# Patient Record
Sex: Male | Born: 1935
Health system: Southern US, Community
[De-identification: ages and names within clinical notes are randomized; demographics above are authoritative.]

## PROBLEM LIST (undated history)

## (undated) DIAGNOSIS — J189 Pneumonia, unspecified organism: Secondary | ICD-10-CM

## (undated) DIAGNOSIS — J8289 Other pulmonary eosinophilia, not elsewhere classified: Secondary | ICD-10-CM

## (undated) DIAGNOSIS — I4891 Unspecified atrial fibrillation: Secondary | ICD-10-CM

## (undated) DIAGNOSIS — Z86010 Personal history of colonic polyps: Secondary | ICD-10-CM

## (undated) DIAGNOSIS — J82 Pulmonary eosinophilia, not elsewhere classified: Secondary | ICD-10-CM

## (undated) DIAGNOSIS — IMO0002 Reserved for concepts with insufficient information to code with codable children: Secondary | ICD-10-CM

## (undated) DIAGNOSIS — J90 Pleural effusion, not elsewhere classified: Secondary | ICD-10-CM

## (undated) DIAGNOSIS — K5730 Diverticulosis of large intestine without perforation or abscess without bleeding: Secondary | ICD-10-CM

## (undated) DIAGNOSIS — R002 Palpitations: Secondary | ICD-10-CM

## (undated) DIAGNOSIS — E785 Hyperlipidemia, unspecified: Secondary | ICD-10-CM

## (undated) DIAGNOSIS — C4491 Basal cell carcinoma of skin, unspecified: Secondary | ICD-10-CM

## (undated) DIAGNOSIS — I82409 Acute embolism and thrombosis of unspecified deep veins of unspecified lower extremity: Secondary | ICD-10-CM

## (undated) DIAGNOSIS — K56609 Unspecified intestinal obstruction, unspecified as to partial versus complete obstruction: Secondary | ICD-10-CM

## (undated) DIAGNOSIS — M1990 Unspecified osteoarthritis, unspecified site: Secondary | ICD-10-CM

## (undated) DIAGNOSIS — R001 Bradycardia, unspecified: Secondary | ICD-10-CM

## (undated) DIAGNOSIS — I214 Non-ST elevation (NSTEMI) myocardial infarction: Secondary | ICD-10-CM

## (undated) DIAGNOSIS — C3491 Malignant neoplasm of unspecified part of right bronchus or lung: Secondary | ICD-10-CM

## (undated) DIAGNOSIS — R0989 Other specified symptoms and signs involving the circulatory and respiratory systems: Secondary | ICD-10-CM

## (undated) DIAGNOSIS — J449 Chronic obstructive pulmonary disease, unspecified: Secondary | ICD-10-CM

## (undated) DIAGNOSIS — I10 Essential (primary) hypertension: Secondary | ICD-10-CM

## (undated) HISTORY — DX: Personal history of colonic polyps: Z86.010

## (undated) HISTORY — PX: APPENDECTOMY: SHX54

## (undated) HISTORY — DX: Bradycardia, unspecified: R00.1

## (undated) HISTORY — PX: JOINT REPLACEMENT: SHX530

## (undated) HISTORY — DX: Malignant neoplasm of unspecified part of right bronchus or lung: C34.91

## (undated) HISTORY — DX: Diverticulosis of large intestine without perforation or abscess without bleeding: K57.30

## (undated) HISTORY — DX: Chronic obstructive pulmonary disease, unspecified: J44.9

## (undated) HISTORY — DX: Unspecified atrial fibrillation: I48.91

## (undated) HISTORY — DX: Pleural effusion, not elsewhere classified: J90

## (undated) HISTORY — DX: Palpitations: R00.2

## (undated) HISTORY — PX: TOTAL HIP ARTHROPLASTY: SHX124

## (undated) HISTORY — PX: TONSILLECTOMY: SUR1361

## (undated) HISTORY — PX: CYSTOSCOPY: SUR368

## (undated) HISTORY — DX: Other specified symptoms and signs involving the circulatory and respiratory systems: R09.89

## (undated) HISTORY — PX: BACK SURGERY: SHX140

## (undated) HISTORY — PX: UMBILICAL HERNIA REPAIR: SHX196

## (undated) HISTORY — PX: INGUINAL HERNIA REPAIR: SUR1180

## (undated) HISTORY — DX: Essential (primary) hypertension: I10

## (undated) HISTORY — DX: Unspecified intestinal obstruction, unspecified as to partial versus complete obstruction: K56.609

## (undated) HISTORY — PX: LUMBAR LAMINECTOMY: SHX95

## (undated) HISTORY — DX: Hyperlipidemia, unspecified: E78.5

## (undated) HISTORY — PX: SHOULDER OPEN ROTATOR CUFF REPAIR: SHX2407

## (undated) HISTORY — PX: CATARACT EXTRACTION W/ INTRAOCULAR LENS IMPLANT: SHX1309

## (undated) HISTORY — PX: COLECTOMY: SHX59

---

## 1997-02-26 DIAGNOSIS — Z86010 Personal history of colonic polyps: Secondary | ICD-10-CM

## 1997-02-26 DIAGNOSIS — Z860101 Personal history of adenomatous and serrated colon polyps: Secondary | ICD-10-CM

## 1997-02-26 HISTORY — DX: Personal history of colonic polyps: Z86.010

## 1997-02-26 HISTORY — DX: Personal history of adenomatous and serrated colon polyps: Z86.0101

## 1997-07-25 ENCOUNTER — Other Ambulatory Visit: Admission: RE | Admit: 1997-07-25 | Discharge: 1997-07-25 | Payer: Self-pay | Admitting: Urology

## 1999-05-30 ENCOUNTER — Encounter: Admission: RE | Admit: 1999-05-30 | Discharge: 1999-05-30 | Payer: Self-pay | Admitting: Gastroenterology

## 1999-05-30 ENCOUNTER — Encounter: Payer: Self-pay | Admitting: Gastroenterology

## 2000-01-04 ENCOUNTER — Encounter: Admission: RE | Admit: 2000-01-04 | Discharge: 2000-01-04 | Payer: Self-pay

## 2000-08-08 ENCOUNTER — Encounter: Admission: RE | Admit: 2000-08-08 | Discharge: 2000-08-08 | Payer: Self-pay | Admitting: Gastroenterology

## 2000-08-08 ENCOUNTER — Encounter: Payer: Self-pay | Admitting: Gastroenterology

## 2000-10-18 ENCOUNTER — Encounter: Payer: Self-pay | Admitting: Surgery

## 2000-10-24 ENCOUNTER — Inpatient Hospital Stay (HOSPITAL_COMMUNITY): Admission: RE | Admit: 2000-10-24 | Discharge: 2000-10-31 | Payer: Self-pay | Admitting: Surgery

## 2000-10-24 ENCOUNTER — Encounter: Payer: Self-pay | Admitting: Urology

## 2000-10-24 ENCOUNTER — Encounter (INDEPENDENT_AMBULATORY_CARE_PROVIDER_SITE_OTHER): Payer: Self-pay

## 2002-12-21 ENCOUNTER — Encounter: Payer: Self-pay | Admitting: Orthopedic Surgery

## 2002-12-26 ENCOUNTER — Inpatient Hospital Stay (HOSPITAL_COMMUNITY): Admission: RE | Admit: 2002-12-26 | Discharge: 2002-12-30 | Payer: Self-pay | Admitting: Orthopedic Surgery

## 2002-12-26 ENCOUNTER — Encounter: Payer: Self-pay | Admitting: Orthopedic Surgery

## 2004-12-24 ENCOUNTER — Ambulatory Visit (HOSPITAL_COMMUNITY): Admission: RE | Admit: 2004-12-24 | Discharge: 2004-12-24 | Payer: Self-pay | Admitting: Internal Medicine

## 2006-07-12 ENCOUNTER — Ambulatory Visit: Payer: Self-pay | Admitting: Internal Medicine

## 2006-07-21 ENCOUNTER — Ambulatory Visit: Payer: Self-pay

## 2006-08-04 ENCOUNTER — Ambulatory Visit: Payer: Self-pay | Admitting: Internal Medicine

## 2006-09-08 ENCOUNTER — Ambulatory Visit: Payer: Self-pay | Admitting: Internal Medicine

## 2006-09-08 LAB — CONVERTED CEMR LAB
INR: 1.4 (ref 0.9–2.0)
Prothrombin Time: 14.7 s — ABNORMAL HIGH (ref 10.0–14.0)
aPTT: 32.1 s (ref 26.5–36.5)

## 2006-09-12 ENCOUNTER — Ambulatory Visit: Payer: Self-pay | Admitting: Internal Medicine

## 2006-09-23 ENCOUNTER — Ambulatory Visit: Payer: Self-pay | Admitting: Internal Medicine

## 2006-09-28 ENCOUNTER — Ambulatory Visit: Payer: Self-pay | Admitting: Cardiology

## 2006-10-05 ENCOUNTER — Ambulatory Visit: Payer: Self-pay | Admitting: Cardiology

## 2006-10-12 ENCOUNTER — Ambulatory Visit: Payer: Self-pay | Admitting: Internal Medicine

## 2006-10-20 ENCOUNTER — Ambulatory Visit: Payer: Self-pay | Admitting: Cardiology

## 2006-10-24 ENCOUNTER — Ambulatory Visit: Payer: Self-pay

## 2006-10-24 ENCOUNTER — Ambulatory Visit: Payer: Self-pay | Admitting: Internal Medicine

## 2006-10-27 ENCOUNTER — Ambulatory Visit: Payer: Self-pay | Admitting: Cardiology

## 2006-11-03 ENCOUNTER — Ambulatory Visit: Payer: Self-pay | Admitting: Cardiovascular Disease

## 2006-11-14 ENCOUNTER — Ambulatory Visit: Payer: Self-pay | Admitting: Cardiology

## 2006-11-30 ENCOUNTER — Ambulatory Visit: Payer: Self-pay | Admitting: Internal Medicine

## 2006-12-05 ENCOUNTER — Ambulatory Visit: Payer: Self-pay | Admitting: Gastroenterology

## 2006-12-14 ENCOUNTER — Ambulatory Visit: Payer: Self-pay | Admitting: Internal Medicine

## 2007-01-04 ENCOUNTER — Ambulatory Visit: Payer: Self-pay | Admitting: Internal Medicine

## 2007-01-11 ENCOUNTER — Ambulatory Visit: Payer: Self-pay | Admitting: Gastroenterology

## 2007-01-11 ENCOUNTER — Encounter: Payer: Self-pay | Admitting: Gastroenterology

## 2007-02-01 ENCOUNTER — Ambulatory Visit: Payer: Self-pay | Admitting: Cardiology

## 2007-02-15 ENCOUNTER — Ambulatory Visit: Payer: Self-pay | Admitting: Cardiology

## 2007-03-16 ENCOUNTER — Ambulatory Visit: Payer: Self-pay | Admitting: Cardiology

## 2007-03-27 ENCOUNTER — Ambulatory Visit: Payer: Self-pay | Admitting: Cardiology

## 2007-07-03 ENCOUNTER — Ambulatory Visit: Payer: Self-pay | Admitting: Internal Medicine

## 2007-07-03 ENCOUNTER — Ambulatory Visit: Payer: Self-pay | Admitting: Cardiovascular Disease

## 2007-07-19 ENCOUNTER — Ambulatory Visit: Payer: Self-pay | Admitting: Cardiology

## 2007-07-20 ENCOUNTER — Ambulatory Visit: Payer: Self-pay | Admitting: Cardiology

## 2007-07-20 ENCOUNTER — Inpatient Hospital Stay (HOSPITAL_COMMUNITY): Admission: EM | Admit: 2007-07-20 | Discharge: 2007-07-31 | Payer: Self-pay | Admitting: Emergency Medicine

## 2007-08-04 ENCOUNTER — Ambulatory Visit: Payer: Self-pay | Admitting: Cardiology

## 2007-08-09 ENCOUNTER — Ambulatory Visit: Payer: Self-pay | Admitting: Cardiology

## 2007-08-15 ENCOUNTER — Ambulatory Visit: Payer: Self-pay

## 2007-08-16 ENCOUNTER — Ambulatory Visit: Payer: Self-pay | Admitting: Cardiology

## 2007-08-23 ENCOUNTER — Ambulatory Visit: Payer: Self-pay | Admitting: Internal Medicine

## 2007-08-23 ENCOUNTER — Ambulatory Visit: Payer: Self-pay | Admitting: Cardiology

## 2007-08-31 ENCOUNTER — Ambulatory Visit: Payer: Self-pay

## 2007-08-31 ENCOUNTER — Encounter: Payer: Self-pay | Admitting: Internal Medicine

## 2007-08-31 ENCOUNTER — Ambulatory Visit: Payer: Self-pay | Admitting: Internal Medicine

## 2007-09-18 ENCOUNTER — Ambulatory Visit: Payer: Self-pay | Admitting: Internal Medicine

## 2007-10-16 ENCOUNTER — Ambulatory Visit: Payer: Self-pay | Admitting: Cardiovascular Disease

## 2007-10-26 ENCOUNTER — Ambulatory Visit: Payer: Self-pay | Admitting: Cardiology

## 2007-11-22 ENCOUNTER — Ambulatory Visit: Payer: Self-pay | Admitting: Cardiovascular Disease

## 2007-12-06 ENCOUNTER — Ambulatory Visit: Payer: Self-pay | Admitting: Internal Medicine

## 2008-01-03 ENCOUNTER — Ambulatory Visit: Payer: Self-pay | Admitting: Internal Medicine

## 2008-01-17 ENCOUNTER — Ambulatory Visit: Payer: Self-pay | Admitting: Cardiology

## 2008-02-02 ENCOUNTER — Ambulatory Visit: Payer: Self-pay | Admitting: Cardiology

## 2008-02-16 ENCOUNTER — Ambulatory Visit: Payer: Self-pay | Admitting: Cardiovascular Disease

## 2008-03-13 ENCOUNTER — Ambulatory Visit: Payer: Self-pay | Admitting: Cardiology

## 2008-03-26 ENCOUNTER — Ambulatory Visit: Payer: Self-pay | Admitting: Cardiovascular Disease

## 2008-07-03 ENCOUNTER — Ambulatory Visit: Payer: Self-pay | Admitting: Cardiology

## 2008-07-17 DIAGNOSIS — K649 Unspecified hemorrhoids: Secondary | ICD-10-CM | POA: Insufficient documentation

## 2008-07-17 DIAGNOSIS — I10 Essential (primary) hypertension: Secondary | ICD-10-CM

## 2008-07-17 DIAGNOSIS — D126 Benign neoplasm of colon, unspecified: Secondary | ICD-10-CM | POA: Insufficient documentation

## 2008-07-17 DIAGNOSIS — R002 Palpitations: Secondary | ICD-10-CM

## 2008-07-17 DIAGNOSIS — J45909 Unspecified asthma, uncomplicated: Secondary | ICD-10-CM

## 2008-07-17 DIAGNOSIS — I4891 Unspecified atrial fibrillation: Secondary | ICD-10-CM | POA: Insufficient documentation

## 2008-07-17 DIAGNOSIS — I498 Other specified cardiac arrhythmias: Secondary | ICD-10-CM

## 2008-07-17 DIAGNOSIS — K5730 Diverticulosis of large intestine without perforation or abscess without bleeding: Secondary | ICD-10-CM | POA: Insufficient documentation

## 2008-07-31 ENCOUNTER — Ambulatory Visit: Payer: Self-pay | Admitting: Cardiovascular Disease

## 2008-08-06 ENCOUNTER — Ambulatory Visit: Payer: Self-pay | Admitting: Internal Medicine

## 2008-08-06 DIAGNOSIS — E785 Hyperlipidemia, unspecified: Secondary | ICD-10-CM | POA: Insufficient documentation

## 2008-08-27 ENCOUNTER — Encounter: Payer: Self-pay | Admitting: *Deleted

## 2008-08-28 ENCOUNTER — Ambulatory Visit: Payer: Self-pay | Admitting: Cardiology

## 2008-08-28 LAB — CONVERTED CEMR LAB
POC INR: 3.8
Protime: 23.7

## 2008-09-11 ENCOUNTER — Ambulatory Visit: Payer: Self-pay | Admitting: Cardiology

## 2008-09-11 LAB — CONVERTED CEMR LAB: POC INR: 2

## 2008-09-18 ENCOUNTER — Encounter: Payer: Self-pay | Admitting: Internal Medicine

## 2008-10-02 ENCOUNTER — Encounter: Payer: Self-pay | Admitting: *Deleted

## 2008-10-09 ENCOUNTER — Ambulatory Visit: Payer: Self-pay | Admitting: Internal Medicine

## 2008-10-09 LAB — CONVERTED CEMR LAB: Prothrombin Time: 17.5 s

## 2008-11-06 ENCOUNTER — Encounter: Payer: Self-pay | Admitting: Cardiology

## 2008-11-11 ENCOUNTER — Ambulatory Visit: Payer: Self-pay | Admitting: Cardiovascular Disease

## 2008-11-11 LAB — CONVERTED CEMR LAB: POC INR: 2.7

## 2008-12-11 ENCOUNTER — Ambulatory Visit: Payer: Self-pay | Admitting: Internal Medicine

## 2009-01-08 ENCOUNTER — Ambulatory Visit: Payer: Self-pay | Admitting: Internal Medicine

## 2009-01-08 LAB — CONVERTED CEMR LAB: POC INR: 2

## 2009-02-05 ENCOUNTER — Ambulatory Visit: Payer: Self-pay | Admitting: Internal Medicine

## 2009-02-05 LAB — CONVERTED CEMR LAB: POC INR: 2.2

## 2009-02-27 ENCOUNTER — Ambulatory Visit: Payer: Self-pay | Admitting: Internal Medicine

## 2009-02-27 ENCOUNTER — Ambulatory Visit: Payer: Self-pay | Admitting: Cardiovascular Disease

## 2009-03-26 ENCOUNTER — Ambulatory Visit: Payer: Self-pay | Admitting: Cardiology

## 2009-04-23 ENCOUNTER — Encounter: Payer: Self-pay | Admitting: Internal Medicine

## 2009-04-24 ENCOUNTER — Encounter: Payer: Self-pay | Admitting: Internal Medicine

## 2009-05-22 ENCOUNTER — Encounter (INDEPENDENT_AMBULATORY_CARE_PROVIDER_SITE_OTHER): Payer: Self-pay | Admitting: Cardiology

## 2009-05-22 ENCOUNTER — Encounter: Payer: Self-pay | Admitting: Cardiology

## 2009-05-22 LAB — CONVERTED CEMR LAB: POC INR: 2.3

## 2009-05-23 ENCOUNTER — Encounter: Payer: Self-pay | Admitting: Cardiology

## 2009-06-16 ENCOUNTER — Encounter (INDEPENDENT_AMBULATORY_CARE_PROVIDER_SITE_OTHER): Payer: Self-pay | Admitting: Cardiology

## 2009-06-17 ENCOUNTER — Encounter: Payer: Self-pay | Admitting: Cardiovascular Disease

## 2009-06-17 LAB — CONVERTED CEMR LAB: POC INR: 2.7

## 2009-07-16 ENCOUNTER — Ambulatory Visit: Payer: Self-pay | Admitting: Cardiology

## 2009-08-13 ENCOUNTER — Ambulatory Visit: Payer: Self-pay | Admitting: Cardiology

## 2009-08-25 ENCOUNTER — Emergency Department (HOSPITAL_COMMUNITY): Admission: EM | Admit: 2009-08-25 | Discharge: 2009-08-25 | Payer: Self-pay | Admitting: Family Medicine

## 2009-08-29 ENCOUNTER — Ambulatory Visit: Payer: Self-pay | Admitting: Internal Medicine

## 2009-08-29 ENCOUNTER — Ambulatory Visit: Payer: Self-pay | Admitting: Cardiovascular Disease

## 2009-08-29 LAB — CONVERTED CEMR LAB: POC INR: 2

## 2009-09-05 ENCOUNTER — Ambulatory Visit: Payer: Self-pay | Admitting: Internal Medicine

## 2009-09-23 ENCOUNTER — Telehealth: Payer: Self-pay | Admitting: Internal Medicine

## 2009-09-26 ENCOUNTER — Ambulatory Visit: Payer: Self-pay | Admitting: Cardiology

## 2009-09-26 LAB — CONVERTED CEMR LAB: POC INR: 2

## 2009-10-10 ENCOUNTER — Ambulatory Visit: Payer: Self-pay | Admitting: Cardiology

## 2009-10-21 ENCOUNTER — Ambulatory Visit: Payer: Self-pay | Admitting: Cardiology

## 2009-10-21 LAB — CONVERTED CEMR LAB: POC INR: 2

## 2009-11-05 ENCOUNTER — Ambulatory Visit: Payer: Self-pay | Admitting: Cardiology

## 2009-11-05 LAB — CONVERTED CEMR LAB: POC INR: 1.6

## 2009-11-19 ENCOUNTER — Ambulatory Visit: Payer: Self-pay | Admitting: Internal Medicine

## 2009-11-19 LAB — CONVERTED CEMR LAB: POC INR: 2.1

## 2009-12-10 ENCOUNTER — Ambulatory Visit: Payer: Self-pay | Admitting: Internal Medicine

## 2009-12-10 LAB — CONVERTED CEMR LAB: POC INR: 2.5

## 2010-01-09 ENCOUNTER — Ambulatory Visit: Payer: Self-pay | Admitting: Cardiology

## 2010-01-09 LAB — CONVERTED CEMR LAB: POC INR: 3.2

## 2010-02-04 ENCOUNTER — Ambulatory Visit: Payer: Self-pay | Admitting: Cardiovascular Disease

## 2010-02-04 LAB — CONVERTED CEMR LAB: POC INR: 2

## 2010-03-03 ENCOUNTER — Ambulatory Visit: Payer: Self-pay | Admitting: Cardiovascular Disease

## 2010-03-03 ENCOUNTER — Ambulatory Visit: Payer: Self-pay | Admitting: Internal Medicine

## 2010-03-03 LAB — CONVERTED CEMR LAB: POC INR: 2.1

## 2010-03-24 ENCOUNTER — Ambulatory Visit: Payer: Self-pay | Admitting: Internal Medicine

## 2010-03-24 LAB — CONVERTED CEMR LAB: POC INR: 2.6

## 2010-04-20 ENCOUNTER — Encounter: Payer: Self-pay | Admitting: Cardiology

## 2010-04-20 ENCOUNTER — Encounter: Payer: Self-pay | Admitting: Internal Medicine

## 2010-04-20 LAB — CONVERTED CEMR LAB
POC INR: 2
Prothrombin Time: 21 s

## 2010-04-21 ENCOUNTER — Encounter: Payer: Self-pay | Admitting: Cardiology

## 2010-04-30 NOTE — Medication Information (Signed)
Summary: rov/sl  Anticoagulant Therapy  Managed by: Bethena Midget, RN, BSN Referring MD: Lewayne Bunting PCP: Dr.William Cleophus Molt MD: Johney Frame MD, Fayrene Fearing Indication 1: Atrial Fibrillation (ICD-427.31) Lab Used: [facility] [place] Site: [address] INR POC 2.5 INR RANGE 2 - 3  Dietary changes: no    Health status changes: no    Bleeding/hemorrhagic complications: no    Recent/future hospitalizations: no    Any changes in medication regimen? no    Recent/future dental: no  Any missed doses?: no       Is patient compliant with meds? yes       Allergies: 1)  ! Sulfa  Anticoagulation Management History:      The patient is taking warfarin and comes in today for a routine follow up visit.  Positive risk factors for bleeding include an age of 75 years or older.  The bleeding index is 'intermediate risk'.  Positive CHADS2 values include History of HTN and Age > 75 years old.  The start date was 09/16/2006.  His last INR was 2.7.  Anticoagulation responsible provider: Allred MD, Fayrene Fearing.  INR POC: 2.5.  Cuvette Lot#: [id number].  Exp: 01/2011.    Anticoagulation Management Assessment/Plan:      The patient's current anticoagulation dose is Coumadin 5 mg tabs: Take as directed by Anticoagulation Clinic.  The target INR is 2.0-3.0.  The next INR is due 01/07/2010.  Anticoagulation instructions were given to patient.  Results were reviewed/authorized by Bethena Midget, RN, BSN.  He was notified by Bethena Midget, RN, BSN.         Prior Anticoagulation Instructions: INR 2.1  Continue taking Coumadin 1 tab (5 mg) on all days except Coumadin 1.5 tabs (7.5 mg) on Wednesdays. Return to clinic in 3 weeks.   Current Anticoagulation Instructions: INR 2.5 Continue 5mg s everyday except 7.5mg s on Wednesdays. Recheck in 4 weeks.

## 2010-04-30 NOTE — Medication Information (Signed)
Summary: sclera hemmorage/ewj  Anticoagulant Therapy  Managed by: Weston Brass, PharmD Referring MD: Lewayne Bunting PCP: Dr.William Cleophus Molt MD: Juanda Chance MD, Raeshaun Simson Indication 1: Atrial Fibrillation (ICD-427.31) Lab Used: [facility] [place] Site: [address] INR POC 2.0 INR RANGE 2 - 3  Dietary changes: no    Health status changes: no    Bleeding/hemorrhagic complications: yes       Details: has sclera hemorrhage in R eye  Recent/future hospitalizations: no    Any changes in medication regimen? no    Recent/future dental: no  Any missed doses?: no       Is patient compliant with meds? yes       Allergies: 1)  ! Sulfa  Anticoagulation Management History:      The patient is taking warfarin and comes in today for a routine follow up visit.  Positive risk factors for bleeding include an age of 3 years or older.  The bleeding index is 'intermediate risk'.  Positive CHADS2 values include History of HTN and Age > 36 years old.  The start date was 09/16/2006.  His last INR was 2.7.  Anticoagulation responsible provider: Juanda Chance MD, Smitty Cords.  INR POC: 2.0.  Cuvette Lot#: [id number].  Exp: 12/2010.    Anticoagulation Management Assessment/Plan:      The patient's current anticoagulation dose is Coumadin 5 mg tabs: Take as directed by Anticoagulation Clinic.  The target INR is 2.0-3.0.  The next INR is due 11/05/2009.  Anticoagulation instructions were given to patient.  Results were reviewed/authorized by Weston Brass, PharmD.  He was notified by Dillard Cannon.         Prior Anticoagulation Instructions: INR 2.3  Continue same regimen of 1.5 tabs on Wednesday and 1 tab all other days.  Re-check INR in 4 weeks.  Current Anticoagulation Instructions: INR 2.0  Continue same dose of 1 tab daily except for 1.5 tabs on Wednesday.  Re-check at previously scheduled appointment on 11/05/09.

## 2010-04-30 NOTE — Medication Information (Signed)
Summary: rov/tm      Allergies Added:  Anticoagulant Therapy  Managed by: Samantha Crimes, PharmD Referring MD: Lewayne Bunting PCP: Dr.William Cleophus Molt MD: Graciela Husbands MD, Viviann Spare Indication 1: Atrial Fibrillation (ICD-427.31) Lab Used: [facility] [place] Site: [address] INR POC 2.6 INR RANGE 2 - 3  Dietary changes: no    Health status changes: no    Bleeding/hemorrhagic complications: no    Recent/future hospitalizations: no    Any changes in medication regimen? no    Recent/future dental: no  Any missed doses?: no       Is patient compliant with meds? yes       Current Medications (verified): 1)  Advair Diskus 250-50 Mcg/dose Misc (Fluticasone-Salmeterol) .Marland Kitchen.. 1 Puff Two Times A Day 2)  Flonase 50 Mcg/act Susp (Fluticasone Propionate) .... Once Daily 3)  Zyrtec Allergy 10 Mg Tabs (Cetirizine Hcl) .Marland Kitchen.. 1 By Mouth Once Daily 4)  Singulair 10 Mg Tabs (Montelukast Sodium) .Marland Kitchen.. 1 By Mouth Once Daily 5)  Prilosec 20 Mg Cpdr (Omeprazole) .... Take One Tablet By Mouth Once Daily. 6)  Multivitamins   Tabs (Multiple Vitamin) .Marland Kitchen.. 1 By Mouth Once Daily 7)  Ambien 10 Mg Tabs (Zolpidem Tartrate) .Marland Kitchen.. 1 By Mouth At Bedtime 8)  Temazepam 15 Mg Caps (Temazepam) .Marland Kitchen.. 1 By Mouth Once Daily 9)  Flecainide Acetate 150 Mg Tabs (Flecainide Acetate) .... Take One Tablet By Mouth Twice Daily. 10)  Celebrex 200 Mg Caps (Celecoxib) .Marland Kitchen.. 1 By Mouth Once Daily 11)  Finasteride 5 Mg Tabs (Finasteride) .Marland Kitchen.. 1 Tab Daily. 12)  Crestor 5 Mg Tabs (Rosuvastatin Calcium) .... Take One Tablet By Mouth Daily. 13)  Flexeril 10 Mg Tabs (Cyclobenzaprine Hcl) .... Prn 14)  Allergy Shots .... As Directed 15)  Coumadin 5 Mg Tabs (Warfarin Sodium) .... Take As Directed By Anticoagulation Clinic 16)  Calcium 600-200 Mg-Unit Tabs (Calcium-Vitamin D) .... Take 1 Daily  Allergies (verified): 1)  ! Sulfa  Anticoagulation Management History:      Positive risk factors for bleeding include an  age of 64 years or older.  The bleeding index is 'intermediate risk'.  Positive CHADS2 values include History of HTN and Age > 82 years old.  The start date was 09/16/2006.  His last INR was 2.7.  Anticoagulation responsible provider: Graciela Husbands MD, Viviann Spare.  INR POC: 2.6.  Exp: 12/2010.    Anticoagulation Management Assessment/Plan:      The patient's current anticoagulation dose is Coumadin 5 mg tabs: Take as directed by Anticoagulation Clinic.  The target INR is 2.0-3.0.  The next INR is due 04/21/2010.  Anticoagulation instructions were given to patient.  Results were reviewed/authorized by Samantha Crimes, PharmD.         Prior Anticoagulation Instructions: INR 2.1 Continue 5mg s daily except 7.5mg s on Wednesdays. Recheck in 3 weeks.   Current Anticoagulation Instructions: Cont current regimen Return to clinic on Jan 20th. Contact us when you return

## 2010-04-30 NOTE — Medication Information (Signed)
Summary: rov/sp  Anticoagulant Therapy  Managed by: Cloyde Reams, RN, BSN Referring MD: Lewayne Bunting PCP: Dr.William Cleophus Molt MD: Excell Seltzer MD, Casimiro Needle Indication 1: Atrial Fibrillation (ICD-427.31) Lab Used: [facility] [place] Site: [address] INR POC 2.0 INR RANGE 2 - 3  Dietary changes: no    Health status changes: no    Bleeding/hemorrhagic complications: no    Recent/future hospitalizations: no    Any changes in medication regimen? yes       Details: Broke rib using prn codiene  Recent/future dental: no  Any missed doses?: no       Is patient compliant with meds? yes       Allergies: 1)  ! Sulfa  Anticoagulation Management History:      The patient is taking warfarin and comes in today for a routine follow up visit.  Positive risk factors for bleeding include an age of 75 years or older.  The bleeding index is 'intermediate risk'.  Positive CHADS2 values include History of HTN and Age > 55 years old.  The start date was 09/16/2006.  His last INR was 2.7.  Anticoagulation responsible provider: Excell Seltzer MD, Casimiro Needle.  INR POC: 2.0.  Cuvette Lot#: [id number].  Exp: 10/2010.    Anticoagulation Management Assessment/Plan:      The patient's current anticoagulation dose is Coumadin 5 mg tabs: Take as directed by Anticoagulation Clinic.  The target INR is 2.0-3.0.  The next INR is due 09/26/2009.  Anticoagulation instructions were given to patient.  Results were reviewed/authorized by Cloyde Reams, RN, BSN.  He was notified by Cloyde Reams RN.         Prior Anticoagulation Instructions: INR 2.3  Continue same dose of 5mg  daily except 7.5mg  on Wednesday.    Current Anticoagulation Instructions: INR 2.0  Take 1.5 tablets tomorrow, then resume same dosage 1 tablet daily except 1.5 tablets on Wednesdays.  Recheck in 4 weeks.

## 2010-04-30 NOTE — Medication Information (Signed)
Summary: rov/tm  Anticoagulant Therapy  Managed by: Bethena Midget, RN, BSN Referring MD: Lewayne Bunting PCP: Dr.William Cleophus Molt MD: Eden Emms MD, Theron Arista Indication 1: Atrial Fibrillation (ICD-427.31) Lab Used: [facility] [place] Site: [address] INR POC 2.1 INR RANGE 2 - 3  Dietary changes: no    Health status changes: no    Bleeding/hemorrhagic complications: no    Recent/future hospitalizations: no    Any changes in medication regimen? no    Recent/future dental: no  Any missed doses?: no       Is patient compliant with meds? yes       Current Medications (verified): 1)  Advair Diskus 250-50 Mcg/dose Misc (Fluticasone-Salmeterol) .Marland Kitchen.. 1 Puff Two Times A Day 2)  Flonase 50 Mcg/act Susp (Fluticasone Propionate) .... Once Daily 3)  Zyrtec Allergy 10 Mg Tabs (Cetirizine Hcl) .Marland Kitchen.. 1 By Mouth Once Daily 4)  Singulair 10 Mg Tabs (Montelukast Sodium) .Marland Kitchen.. 1 By Mouth Once Daily 5)  Prilosec 20 Mg Cpdr (Omeprazole) .... Take One Tablet By Mouth Once Daily. 6)  Multivitamins   Tabs (Multiple Vitamin) .Marland Kitchen.. 1 By Mouth Once Daily 7)  Ambien 10 Mg Tabs (Zolpidem Tartrate) .Marland Kitchen.. 1 By Mouth At Bedtime 8)  Temazepam 15 Mg Caps (Temazepam) .Marland Kitchen.. 1 By Mouth Once Daily 9)  Flecainide Acetate 150 Mg Tabs (Flecainide Acetate) .... Take One Tablet By Mouth Twice Daily. 10)  Celebrex 200 Mg Caps (Celecoxib) .Marland Kitchen.. 1 By Mouth Once Daily 11)  Finasteride 5 Mg Tabs (Finasteride) .Marland Kitchen.. 1 Tab Daily. 12)  Crestor 5 Mg Tabs (Rosuvastatin Calcium) .... Take One Tablet By Mouth Daily. 13)  Flexeril 10 Mg Tabs (Cyclobenzaprine Hcl) .... Prn 14)  Allergy Shots .... As Directed 15)  Coumadin 5 Mg Tabs (Warfarin Sodium) .... Take As Directed By Anticoagulation Clinic 16)  Calcium 600-200 Mg-Unit Tabs (Calcium-Vitamin D) .... Take 1 Daily  Allergies (verified): 1)  ! Sulfa  Anticoagulation Management History:      The patient is taking warfarin and comes in today for a routine follow  up visit.  Positive risk factors for bleeding include an age of 75 years or older.  The bleeding index is 'intermediate risk'.  Positive CHADS2 values include History of HTN and Age > 93 years old.  The start date was 09/16/2006.  His last INR was 2.7.  Anticoagulation responsible provider: Eden Emms MD, Theron Arista.  INR POC: 2.1.  Cuvette Lot#: [id number].  Exp: 12/2010.    Anticoagulation Management Assessment/Plan:      The patient's current anticoagulation dose is Coumadin 5 mg tabs: Take as directed by Anticoagulation Clinic.  The target INR is 2.0-3.0.  The next INR is due 03/24/2010.  Anticoagulation instructions were given to patient.  Results were reviewed/authorized by Bethena Midget, RN, BSN.  He was notified by Bethena Midget, RN, BSN.         Prior Anticoagulation Instructions: INR 2.0 Continue 5mg s daily except 7.5mg s on Wednesdays. Recheck in 4 weeks.   Current Anticoagulation Instructions: INR 2.1 Continue 5mg s daily except 7.5mg s on Wednesdays. Recheck in 3 weeks.

## 2010-04-30 NOTE — Assessment & Plan Note (Signed)
Summary: per check out/sf  Medications Added FLECAINIDE ACETATE 150 MG TABS (FLECAINIDE ACETATE) Take one tablet by mouth twice daily. BROMDAY 0.09 % SOLN (BROMFENAC SODIUM) UAD      Allergies Added:   Visit Type:  Follow-up Primary Provider:  Dr.William Clelia [name]   History of Present Illness: [name] returns today for followup.  He is a pleasant 77 yo man with a h/o PAF, HTN, and dyslipidemia.  He has done well on Flecainide for control of his atrial fibrillation.  He notes rare palpitations which are minimally symptomatic. No other complaints.  Current Medications (verified): 1)  Advair Diskus 250-50 Mcg/dose Misc (Fluticasone-Salmeterol) .Marland Kitchen.. 1 Puff Two Times A Day 2)  Flonase 50 Mcg/act Susp (Fluticasone Propionate) .... Once Daily 3)  Zyrtec Allergy 10 Mg Tabs (Cetirizine Hcl) .Marland Kitchen.. 1 By Mouth Once Daily 4)  Singulair 10 Mg Tabs (Montelukast Sodium) .Marland Kitchen.. 1 By Mouth Once Daily 5)  Prilosec 20 Mg Cpdr (Omeprazole) .... Take One Tablet By Mouth Once Daily. 6)  Multivitamins   Tabs (Multiple Vitamin) .Marland Kitchen.. 1 By Mouth Once Daily 7)  Calcium Carbonate   Powd (Calcium Carbonate) .Marland Kitchen.. 1 By Mouth Once Daily 8)  Ambien 10 Mg Tabs (Zolpidem Tartrate) .Marland Kitchen.. 1 By Mouth At Bedtime 9)  Temazepam 15 Mg Caps (Temazepam) .Marland Kitchen.. 1 By Mouth Once Daily 10)  Flecainide Acetate 150 Mg Tabs (Flecainide Acetate) .... Take One Tablet By Mouth Twice Daily. 11)  Celebrex 200 Mg Caps (Celecoxib) .Marland Kitchen.. 1 By Mouth Once Daily 12)  Finasteride 5 Mg Tabs (Finasteride) .Marland Kitchen.. 1 Tab Daily. 13)  Crestor 5 Mg Tabs (Rosuvastatin Calcium) .... Take One Tablet By Mouth Daily. 14)  Flexeril 10 Mg Tabs (Cyclobenzaprine Hcl) .Marland Kitchen.. 1 By Mouth Once Daily 15)  Gabapentin 300 Mg Caps (Gabapentin) .Marland Kitchen.. 1 By Mouth Once Daily 16)  Allergy Shots .... As Directed 17)  Coumadin 5 Mg Tabs (Warfarin Sodium) .... Take As Directed By Anticoagulation Clinic 18)  Bromday 0.09 % Soln (Bromfenac Sodium) .... Uad  Allergies (verified): 1)  !  Sulfa  Past History:  Past Medical History: Last updated: 07/17/2008 BRADYCARDIA (ICD-427.89) PALPITATIONS (ICD-785.1) ASTHMA (ICD-493.90) HEMORRHOIDS (ICD-455.6) COLONIC POLYPS, ADENOMATOUS (ICD-211.3) DIVERTICULOSIS, COLON (ICD-562.10) COUMADIN THERAPY (ICD-V58.61) ATRIAL FIBRILLATION (ICD-427.31) HYPERTENSION, UNSPECIFIED (ICD-401.9) Pulmonary Aspergillosis  Past Surgical History: Last updated: 07/17/2008 Colon Resection: Hip replacement Appendectomy Laninectomy Cystoscopy  Review of Systems  The patient denies chest pain, syncope, dyspnea on exertion, and peripheral edema.    Vital Signs:  Patient profile:   77 year old male Height:      70 inches Weight:      170 pounds BMI:     24.48 Pulse rate:   60 / minute BP sitting:   144 / 82  (left arm)  Vitals Entered By: Laurance Flatten CMA (August 29, 2009 2:17 PM)  Physical Exam  General:  Elderly, well developed, well nourished, in no acute distress.  HEENT: normal Neck: supple. No JVD. Carotids 2+ bilaterally no bruits Cor: RRR no rubs, gallops or murmur Lungs: CTA Ab: soft, nontender. nondistended. No HSM. Good bowel sounds Ext: warm. no cyanosis, clubbing or edema Neuro: alert and oriented. Grossly nonfocal. affect pleasant    EKG  Procedure date:  08/29/2009  Findings:      Normal sinus rhythm with rate of:  60.  Impression & Recommendations:  Problem # 1:  ATRIAL FIBRILLATION (ICD-427.31) His symptoms have been well controlled.  Continue current meds.  I have asked him to obtain a trough flecainide  level in the next week. His updated medication list for this problem includes:    Flecainide Acetate 150 Mg Tabs (Flecainide acetate) .Marland Kitchen... Take one tablet by mouth twice daily.    Coumadin 5 Mg Tabs (Warfarin sodium) .Marland Kitchen... Take as directed by anticoagulation clinic  Problem # 2:  DYSLIPIDEMIA (ICD-272.4) He will continue his Crestor and maintain a low fat diet. His updated medication list for this  problem includes:    Crestor 5 Mg Tabs (Rosuvastatin calcium) .Marland Kitchen... Take one tablet by mouth daily.  Problem # 3:  COUMADIN THERAPY (ICD-V58.61) We discussed the possibility of switching to Pradaxa and the risks/benefits/of the drug have been discussed and he would like to stay with coumadin for now.  Patient Instructions: 1)  Your physician recommends that you schedule a follow-up appointment in: 6 months with Dr [name] 2)  Your physician recommends that you return for lab work next week trough Flecainide level

## 2010-04-30 NOTE — Medication Information (Signed)
Summary: rov/sp   Anticoagulant Therapy  Managed by: Reina Fuse, PharmD Referring MD: Lewayne Bunting PCP: Dr.William Cleophus Molt MD: Johney Frame MD, Fayrene Fearing Indication 1: Atrial Fibrillation (ICD-427.31) Lab Used: [facility]  Site: [address] INR POC 2.1 INR RANGE 2 - 3  Dietary changes: no    Health status changes: no    Bleeding/hemorrhagic complications: no    Recent/future hospitalizations: no    Any changes in medication regimen? no    Recent/future dental: no  Any missed doses?: no       Is patient compliant with meds? yes       Allergies: 1)  ! Sulfa  Anticoagulation Management History:      The patient is taking warfarin and comes in today for a routine follow up visit.  Positive risk factors for bleeding include an age of 75 years or older.  The bleeding index is 'intermediate risk'.  Positive CHADS2 values include History of HTN and Age > 7 years old.  The start date was 09/16/2006.  His last INR was 2.7.  Anticoagulation responsible provider: Allred MD, Fayrene Fearing.  INR POC: 2.1.  Cuvette Lot#: [id number].  Exp: 12/2010.    Anticoagulation Management Assessment/Plan:      The patient's current anticoagulation dose is Coumadin 5 mg tabs: Take as directed by Anticoagulation Clinic.  The target INR is 2.0-3.0.  The next INR is due 12/10/2009.  Anticoagulation instructions were given to patient.  Results were reviewed/authorized by Reina Fuse, PharmD.  He was notified by Reina Fuse PharmD.         Prior Anticoagulation Instructions: INR 1.6  Take 1.5 tablets tomorrow, Aug. 11, then resume with 1 tablet daily except 1.5 tablets on Wed.  Return to clinic in 2 weeks.   Current Anticoagulation Instructions: INR 2.1  Continue taking Coumadin 1 tab (5 mg) on all days except Coumadin 1.5 tabs (7.5 mg) on Wednesdays. Return to clinic in 3 weeks.

## 2010-04-30 NOTE — Medication Information (Signed)
Summary: Coumadin Clinic  Anticoagulant Therapy  Managed by: Cloyde Reams, RN, BSN Referring MD: Lewayne Bunting PCP: Dr.William Cleophus Molt MD: Graciela Husbands MD, Viviann Spare Indication 1: Atrial Fibrillation (ICD-427.31) Lab Used: [facility] [place] Site: [facility] 24.8 INR POC 2.4 INR RANGE 2 - 3    Bleeding/hemorrhagic complications: no     Any changes in medication regimen? no     Any missed doses?: no         Allergies: 1)  ! Sulfa  Anticoagulation Management History:      His anticoagulation is being managed by telephone today.  Positive risk factors for bleeding include an age of 75 years or older.  The bleeding index is 'intermediate risk'.  Positive CHADS2 values include History of HTN and Age > 75 years old.  The start date was 09/16/2006.  His last INR was 2.7.  Prothrombin time is 24.8.  Anticoagulation responsible provider: Graciela Husbands MD, Viviann Spare.  INR POC: 2.4.  Exp: 04/2010.    Anticoagulation Management Assessment/Plan:      The patient's current anticoagulation dose is Coumadin 5 mg tabs: Take as directed by Anticoagulation Clinic.  The target INR is 2.0-3.0.  The next INR is due 05/22/2009.  Anticoagulation instructions were given to patient.  Results were reviewed/authorized by Cloyde Reams, RN, BSN.  He was notified by Cloyde Reams RN.         Prior Anticoagulation Instructions: INR 2.0 Continue 5mg s daily excpet 7.5mg s on Wednesdays. Recheck in 4 weeks. Pt. going to [facility]. Rx given to check there for next . pt. phone # [phone]  Current Anticoagulation Instructions: INR 2.4  Called spoke with pt.  Advised to continue on same dosage 5mg  daily except 7.5mg  on Wednesdays.  Pt will recheck in [place] in 4 weeks.  Phone # [phone]

## 2010-04-30 NOTE — Medication Information (Signed)
Summary: rov/sp  Anticoagulant Therapy  Managed by: Weston Brass, PharmD Referring MD: Lewayne Bunting PCP: Dr.William Cleophus Molt MD: Shirlee Latch MD, Freida Busman Indication 1: Atrial Fibrillation (ICD-427.31) Lab Used: [facility]  Site: [address] INR POC 2.3 INR RANGE 2 - 3  Dietary changes: no    Health status changes: no    Bleeding/hemorrhagic complications: no    Recent/future hospitalizations: no    Any changes in medication regimen? no    Recent/future dental: no  Any missed doses?: no       Is patient compliant with meds? yes       Allergies: 1)  ! Sulfa  Anticoagulation Management History:      The patient is taking warfarin and comes in today for a routine follow up visit.  Positive risk factors for bleeding include an age of 75 years or older.  The bleeding index is 'intermediate risk'.  Positive CHADS2 values include History of HTN and Age > 61 years old.  The start date was 09/16/2006.  His last INR was 2.7.  Anticoagulation responsible provider: Shirlee Latch MD, Tamia Dial.  INR POC: 2.3.  Exp: 04/2010.    Anticoagulation Management Assessment/Plan:      The patient's current anticoagulation dose is Coumadin 5 mg tabs: Take as directed by Anticoagulation Clinic.  The target INR is 2.0-3.0.  The next INR is due 08/29/2009.  Anticoagulation instructions were given to patient.  Results were reviewed/authorized by Weston Brass, PharmD.  He was notified by Weston Brass PharmD.         Prior Anticoagulation Instructions: INR 2.3  Continue same dose of 5mg  daily except 7.5mg  on Wednesday   Current Anticoagulation Instructions: INR 2.3  Continue same dose of 5mg  daily except 7.5mg  on Wednesday.

## 2010-04-30 NOTE — Medication Information (Signed)
Summary: rov/ewj  Anticoagulant Therapy  Managed by: Weston Brass, PharmD Referring MD: Lewayne Bunting PCP: Dr.William Cleophus Molt MD: Riley Kill MD, Maisie Fus Indication 1: Atrial Fibrillation (ICD-427.31) Lab Used: [facility] [place] Site: [address] INR POC 2.3 INR RANGE 2 - 3  Dietary changes: yes       Details: may have been eating a little less greens  Health status changes: no    Bleeding/hemorrhagic complications: no    Recent/future hospitalizations: no    Any changes in medication regimen? no    Recent/future dental: no  Any missed doses?: no       Is patient compliant with meds? yes      Comments: pt requested to come early due to concerns over diet and upcoming vacation, wanted to ensure he was still within his INR range  Allergies: 1)  ! Sulfa  Anticoagulation Management History:      The patient is taking warfarin and comes in today for a routine follow up visit.  Positive risk factors for bleeding include an age of 75 years or older.  The bleeding index is 'intermediate risk'.  Positive CHADS2 values include History of HTN and Age > 71 years old.  The start date was 09/16/2006.  His last INR was 2.7.  Anticoagulation responsible provider: Riley Kill MD, Maisie Fus.  INR POC: 2.3.  Cuvette Lot#: [id number].  Exp: 09/12.    Anticoagulation Management Assessment/Plan:      The patient's current anticoagulation dose is Coumadin 5 mg tabs: Take as directed by Anticoagulation Clinic.  The target INR is 2.0-3.0.  The next INR is due 11/07/2009.  Anticoagulation instructions were given to patient.  Results were reviewed/authorized by Weston Brass, PharmD.  He was notified by Dillard Cannon.         Prior Anticoagulation Instructions: INR 2.0  Continue on same dosage 1 tablet daily except 1.5 tablets on Wednesdays. Recheck in 4 weeks, unless restart Avelox or start on a new medication then call us.   Current Anticoagulation Instructions: INR 2.3  Continue same  regimen of 1.5 tabs on Wednesday and 1 tab all other days.  Re-check INR in 4 weeks.

## 2010-04-30 NOTE — Medication Information (Signed)
Summary: [name]  Anticoagulant Therapy  Managed by: Cloyde Reams, RN, BSN Referring MD: Lewayne Bunting PCP: Dr.William Cleophus Molt MD: Juanda Chance MD, Babyboy Loya Indication 1: Atrial Fibrillation (ICD-427.31) Lab Used: [facility] [place] Site: [address] INR POC 2.0 INR RANGE 2 - 3  Dietary changes: no    Health status changes: yes       Details: avelox for upper resp infection x 3 days, started 09/23/09.  Calling  MD wishes to d/c d/t s/e.  Pt WCB if new abx started.  Bleeding/hemorrhagic complications: no    Recent/future hospitalizations: no    Any changes in medication regimen? yes       Details: see above  Recent/future dental: no  Any missed doses?: no       Is patient compliant with meds? yes       Allergies: 1)  ! Sulfa  Anticoagulation Management History:      The patient is taking warfarin and comes in today for a routine follow up visit.  Positive risk factors for bleeding include an age of 75 years or older.  The bleeding index is 'intermediate risk'.  Positive CHADS2 values include History of HTN and Age > 38 years old.  The start date was 09/16/2006.  His last INR was 2.7.  Anticoagulation responsible provider: Juanda Chance MD, Smitty Cords.  INR POC: 2.0.  Cuvette Lot#: [id number].  Exp: 09/12.    Anticoagulation Management Assessment/Plan:      The patient's current anticoagulation dose is Coumadin 5 mg tabs: Take as directed by Anticoagulation Clinic.  The target INR is 2.0-3.0.  The next INR is due 10/24/2009.  Anticoagulation instructions were given to patient.  Results were reviewed/authorized by Cloyde Reams, RN, BSN.  He was notified by Cloyde Reams RN.         Prior Anticoagulation Instructions: INR 2.0  Take 1.5 tablets tomorrow, then resume same dosage 1 tablet daily except 1.5 tablets on Wednesdays.  Recheck in 4 weeks.    Current Anticoagulation Instructions: INR 2.0  Continue on same dosage 1 tablet daily except 1.5 tablets on Wednesdays.  Recheck in 4 weeks, unless restart Avelox or start on a new medication then call us.

## 2010-04-30 NOTE — Medication Information (Signed)
Summary: rov/ln  Anticoagulant Therapy  Managed by: Weston Brass, PharmD Referring MD: Lewayne Bunting PCP: Dr.William Cleophus Molt MD: Jens Som MD, Arlys John Indication 1: Atrial Fibrillation (ICD-427.31) Lab Used: [facility] [place] Site: [address] INR POC 1.6 INR RANGE 2 - 3  Dietary changes: no    Health status changes: no    Bleeding/hemorrhagic complications: no    Recent/future hospitalizations: no    Any changes in medication regimen? no    Recent/future dental: no  Any missed doses?: no       Is patient compliant with meds? yes       Allergies: 1)  ! Sulfa  Anticoagulation Management History:      The patient is taking warfarin and comes in today for a routine follow up visit.  Positive risk factors for bleeding include an age of 75 years or older.  The bleeding index is 'intermediate risk'.  Positive CHADS2 values include History of HTN and Age > 75 years old.  The start date was 09/16/2006.  His last INR was 2.7.  Anticoagulation responsible provider: Jens Som MD, Arlys John.  INR POC: 1.6.  Cuvette Lot#: [id number].  Exp: 12/2010.    Anticoagulation Management Assessment/Plan:      The patient's current anticoagulation dose is Coumadin 5 mg tabs: Take as directed by Anticoagulation Clinic.  The target INR is 2.0-3.0.  The next INR is due 11/19/2009.  Anticoagulation instructions were given to patient.  Results were reviewed/authorized by Weston Brass, PharmD.  He was notified by Liana Gerold, PharmD Candidate.         Prior Anticoagulation Instructions: INR 2.0  Continue same dose of 1 tab daily except for 1.5 tabs on Wednesday.  Re-check at previously scheduled appointment on 11/05/09.  Current Anticoagulation Instructions: INR 1.6  Take 1.5 tablets tomorrow, Aug. 11, then resume with 1 tablet daily except 1.5 tablets on Wed.  Return to clinic in 2 weeks.

## 2010-04-30 NOTE — Assessment & Plan Note (Signed)
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  He is a pleasant 77 yo man with a h/o PAF, HTN, and dyslipidemia.  He has done well on Flecainide for control of his atrial fibrillation.  He notes rare palpitations which are minimally symptomatic. No other complaints. No syncope.  Current Medications (verified): 1)  Advair Diskus 250-50 Mcg/dose Misc (Fluticasone-Salmeterol) .Marland Kitchen.. 1 Puff Two Times A Day 2)  Flonase 50 Mcg/act Susp (Fluticasone Propionate) .... Once Daily 3)  Zyrtec Allergy 10 Mg Tabs (Cetirizine Hcl) .Marland Kitchen.. 1 By Mouth Once Daily 4)  Singulair 10 Mg Tabs (Montelukast Sodium) .Marland Kitchen.. 1 By Mouth Once Daily 5)  Prilosec 20 Mg Cpdr (Omeprazole) .... Take One Tablet By Mouth Once Daily. 6)  Multivitamins   Tabs (Multiple Vitamin) .Marland Kitchen.. 1 By Mouth Once Daily 7)  Ambien 10 Mg Tabs (Zolpidem Tartrate) .Marland Kitchen.. 1 By Mouth At Bedtime 8)  Temazepam 15 Mg Caps (Temazepam) .Marland Kitchen.. 1 By Mouth Once Daily 9)  Flecainide Acetate 150 Mg Tabs (Flecainide Acetate) .... Take One Tablet By Mouth Twice Daily. 10)  Celebrex 200 Mg Caps (Celecoxib) .Marland Kitchen.. 1 By Mouth Once Daily 11)  Finasteride 5 Mg Tabs (Finasteride) .Marland Kitchen.. 1 Tab Daily. 12)  Crestor 5 Mg Tabs (Rosuvastatin Calcium) .... Take One Tablet By Mouth Daily. 13)  Flexeril 10 Mg Tabs (Cyclobenzaprine Hcl) .... Prn 14)  Allergy Shots .... As Directed 15)  Coumadin 5 Mg Tabs (Warfarin Sodium) .... Take As Directed By Anticoagulation Clinic 16)  Calcium 600-200 Mg-Unit Tabs (Calcium-Vitamin D) .... Take 1 Daily  Allergies: 1)  ! Sulfa  Past History:  Past Medical History: Last updated: 07/17/2008 BRADYCARDIA (ICD-427.89) PALPITATIONS (ICD-785.1) ASTHMA (ICD-493.90) HEMORRHOIDS (ICD-455.6) COLONIC POLYPS, ADENOMATOUS (ICD-211.3) DIVERTICULOSIS, COLON (ICD-562.10) COUMADIN THERAPY (ICD-V58.61) ATRIAL FIBRILLATION (ICD-427.31) HYPERTENSION,  UNSPECIFIED (ICD-401.9) Pulmonary Aspergillosis  Past Surgical History: Last updated: 07/17/2008 Colon Resection: Hip replacement Appendectomy Laninectomy Cystoscopy  Review of Systems  The patient denies chest pain, syncope, dyspnea on exertion, and peripheral edema.    Vital Signs:  Patient profile:   77 year old male Height:      70 inches Weight:      174 pounds BMI:     25.06 Pulse rate:   59 / minute BP sitting:   160 / 80  (left arm)  Vitals Entered By: Laurance Flatten CMA (March 03, 2010 2:15 PM)  Physical Exam  General:  Elderly, well developed, well nourished, in no acute distress.  HEENT: normal Neck: supple. No JVD. Carotids 2+ bilaterally no bruits Cor: RRR no rubs, gallops or murmur Lungs: CTA Ab: soft, nontender. nondistended. No HSM. Good bowel sounds Ext: warm. no cyanosis, clubbing or edema Neuro: alert and oriented. Grossly nonfocal. affect pleasant    EKG  Procedure date:  03/03/2010  Findings:      Normal sinus rhythm with rate of: 60.  First degree AV-Block noted.    Impression & Recommendations:  Problem # 1:  HHYPERTENSION, UNSPECIFIED (ICD-401.9) His blood pressure is elevated today but he notes that it has been well controlled.  I have asked him to follow and reduce his sodium intake.  If it remains elevated then he will require medication.  Problem # 2:  BRADYCARDIA (ICD-427.89) He remains asymptomatic.  He will continue his current meds. His updated medication list for this problem includes:    Flecainide Acetate 150 Mg Tabs (Flecainide acetate) .Marland KitchenMarland KitchenMarland KitchenMarland Kitchen  Take one tablet by mouth twice daily.    Coumadin 5 Mg Tabs (Warfarin sodium) .Marland Kitchen... Take as directed by anticoagulation clinic  Problem # 3:  ATRIAL FIBRILLATION (ICD-427.31) His symptoms are well controlled.  He will continue meds as below. His updated medication list for this problem includes:    Flecainide Acetate 150 Mg Tabs (Flecainide acetate) .Marland Kitchen... Take one tablet by mouth  twice daily.    Coumadin 5 Mg Tabs (Warfarin sodium) .Marland Kitchen... Take as directed by anticoagulation clinic  Patient Instructions: 1)  Your physician wants you to follow-up in: 6 months with Dr Court Joy will receive a reminder letter in the mail two months in advance. If you don't receive a letter, please call our office to schedule the follow-up appointment.

## 2010-04-30 NOTE — Medication Information (Signed)
Summary: cvrr  Anticoagulant Therapy  Managed by: Weston Brass, PharmD Referring MD: Lewayne Bunting PCP: Dr.William Cleophus Molt MD: Shirlee Latch MD, Freida Busman Indication 1: Atrial Fibrillation (ICD-427.31) Lab Used: [facility] [place] Site: [facility] INR POC 2.3 INR RANGE 2 - 3  Dietary changes: no    Health status changes: no    Bleeding/hemorrhagic complications: no    Recent/future hospitalizations: no    Any changes in medication regimen? no    Recent/future dental: no  Any missed doses?: no       Is patient compliant with meds? yes       Allergies: 1)  ! Sulfa  Anticoagulation Management History:      The patient is taking warfarin and comes in today for a routine follow up visit.  Positive risk factors for bleeding include an age of 75 years or older.  The bleeding index is 'intermediate risk'.  Positive CHADS2 values include History of HTN and Age > 81 years old.  The start date was 09/16/2006.  His last INR was 2.7.  Anticoagulation responsible provider: Shirlee Latch MD, Dameon Soltis.  INR POC: 2.3.  Cuvette Lot#: [id number].  Exp: 04/2010.    Anticoagulation Management Assessment/Plan:      The patient's current anticoagulation dose is Coumadin 5 mg tabs: Take as directed by Anticoagulation Clinic.  The target INR is 2.0-3.0.  The next INR is due 08/13/2009.  Anticoagulation instructions were given to patient.  Results were reviewed/authorized by Weston Brass, PharmD.  He was notified by Weston Brass PharmD.         Prior Anticoagulation Instructions: INR 2.7  Called spoke with pt.  Advised to continue on same dosage 5mg  daily except 7.5mg  on Wednesdays.  Recheck in 4 weeks.  Phone #[phone].  Current Anticoagulation Instructions: INR 2.3  Continue same dose of 5mg  daily except 7.5mg  on Wednesday

## 2010-04-30 NOTE — Medication Information (Signed)
Summary: [facility]  Anticoagulant Therapy  Managed by: Cloyde Reams, RN, BSN Referring MD: Lewayne Bunting PCP: Dr.William Cleophus Molt MD: Eden Emms MD, Theron Arista Indication 1: Atrial Fibrillation (ICD-427.31) Lab Used: [facility] [place] Site: [facility] PT 28.2 INR POC 2.7 INR RANGE 2 - 3    Bleeding/hemorrhagic complications: no     Any changes in medication regimen? no     Any missed doses?: no       Is patient compliant with meds? yes       Allergies: 1)  ! Sulfa  Anticoagulation Management History:      His anticoagulation is being managed by telephone today.  Positive risk factors for bleeding include an age of 75 years or older.  The bleeding index is 'intermediate risk'.  Positive CHADS2 values include History of HTN and Age > 10 years old.  The start date was 09/16/2006.  His last INR was 2.7.  Prothrombin time is 28.2.  Anticoagulation responsible provider: Eden Emms MD, Theron Arista.  INR POC: 2.7.  Exp: 04/2010.    Anticoagulation Management Assessment/Plan:      The patient's current anticoagulation dose is Coumadin 5 mg tabs: Take as directed by Anticoagulation Clinic.  The target INR is 2.0-3.0.  The next INR is due 07/15/2009.  Anticoagulation instructions were given to patient.  Results were reviewed/authorized by Cloyde Reams, RN, BSN.  He was notified by Cloyde Reams RN.         Prior Anticoagulation Instructions: INR 2.3  Called spoke with pt.  Advised pt to continue on same dosage 1 tablet daily except 1.5 tablets on Wednesdays.   Recheck in 4 weeks.  Phone # [phone].  Current Anticoagulation Instructions: INR 2.7  Called spoke with pt.  Advised to continue on same dosage 5mg  daily except 7.5mg  on Wednesdays.  Recheck in 4 weeks.  Phone #[phone].

## 2010-04-30 NOTE — Medication Information (Signed)
Summary: rov/ewj  Anticoagulant Therapy  Managed by: Bethena Midget, RN, BSN Referring MD: Lewayne Bunting PCP: Dr.William Cleophus Molt MD: Eden Emms MD, Theron Arista Indication 1: Atrial Fibrillation (ICD-427.31) Lab Used: [facility] [place] Site: [address] INR POC 2.0 INR RANGE 2 - 3  Dietary changes: no    Health status changes: no    Bleeding/hemorrhagic complications: no    Recent/future hospitalizations: no    Any changes in medication regimen? no    Recent/future dental: no  Any missed doses?: no       Is patient compliant with meds? yes       Allergies: 1)  ! Sulfa  Anticoagulation Management History:      The patient is taking warfarin and comes in today for a routine follow up visit.  Positive risk factors for bleeding include an age of 75 years or older.  The bleeding index is 'intermediate risk'.  Positive CHADS2 values include History of HTN and Age > 75 years old.  The start date was 09/16/2006.  His last INR was 2.7.  Anticoagulation responsible Mida Cory: Eden Emms MD, Theron Arista.  INR POC: 2.0.  Cuvette Lot#: [id number].  Exp: 01/2011.    Anticoagulation Management Assessment/Plan:      The patient's current anticoagulation dose is Coumadin 5 mg tabs: Take as directed by Anticoagulation Clinic.  The target INR is 2.0-3.0.  The next INR is due 03/03/2010.  Anticoagulation instructions were given to patient.  Results were reviewed/authorized by Bethena Midget, RN, BSN.  He was notified by Bethena Midget, RN, BSN.         Prior Anticoagulation Instructions: INR 3.2  Skip tomorrow's dosage of Coumadin, then resume same dosage 1 tablet daily except 1.5 tablets on Wednesdays.  Recheck in 3-4 weeks.    Current Anticoagulation Instructions: INR 2.0 Continue 5mg s daily except 7.5mg s on Wednesdays. Recheck in 4 weeks.

## 2010-04-30 NOTE — Medication Information (Signed)
Summary: Coumadin Clinic  Anticoagulant Therapy  Managed by: Cloyde Reams, RN, BSN Referring MD: Lewayne Bunting PCP: Dr.William Cleophus Molt MD: Antoine Poche MD, Fayrene Fearing Indication 1: Atrial Fibrillation (ICD-427.31) Lab Used: [facility] [place] Site: [facility] PT 23.8 INR POC 2.3 INR RANGE 2 - 3    Bleeding/hemorrhagic complications: no     Any changes in medication regimen? no     Any missed doses?: no         Allergies: 1)  ! Sulfa  Anticoagulation Management History:      His anticoagulation is being managed by telephone today.  Positive risk factors for bleeding include an age of 17 years or older.  The bleeding index is 'intermediate risk'.  Positive CHADS2 values include History of HTN and Age > 63 years old.  The start date was 09/16/2006.  His last INR was 2.7.  Prothrombin time is 23.8.  Anticoagulation responsible provider: Antoine Poche MD, Fayrene Fearing.  INR POC: 2.3.  Exp: 04/2010.    Anticoagulation Management Assessment/Plan:      The patient's current anticoagulation dose is Coumadin 5 mg tabs: Take as directed by Anticoagulation Clinic.  The target INR is 2.0-3.0.  The next INR is due 06/19/2009.  Anticoagulation instructions were given to patient.  Results were reviewed/authorized by Cloyde Reams, RN, BSN.  He was notified by Cloyde Reams RN.         Prior Anticoagulation Instructions: INR 2.4  Called spoke with pt.  Advised to continue on same dosage 5mg  daily except 7.5mg  on Wednesdays.  Pt [place] recheck in [place] in 4 weeks.  Phone # [phone]  Current Anticoagulation Instructions: INR 2.3  Called spoke with pt.  Advised pt to continue on same dosage 1 tablet daily except 1.5 tablets on Wednesdays.   Recheck in 4 weeks.  Phone # [phone].

## 2010-04-30 NOTE — Medication Information (Signed)
Summary: rov/tm  Anticoagulant Therapy  Managed by: Cloyde Reams, RN, BSN Referring MD: Lewayne Bunting PCP: Dr.William Cleophus Molt MD: Riley Kill MD, Maisie Fus Indication 1: Atrial Fibrillation (ICD-427.31) Lab Used: [facility] [place] Site: [address] INR POC 3.2 INR RANGE 2 - 3  Dietary changes: no    Health status changes: no    Bleeding/hemorrhagic complications: no    Recent/future hospitalizations: no    Any changes in medication regimen? no    Recent/future dental: no  Any missed doses?: no       Is patient compliant with meds? yes       Allergies: 1)  ! Sulfa  Anticoagulation Management History:      The patient is taking warfarin and comes in today for a routine follow up visit.  Positive risk factors for bleeding include an age of 75 years or older.  The bleeding index is 'intermediate risk'.  Positive CHADS2 values include History of HTN and Age > 13 years old.  The start date was 09/16/2006.  His last INR was 2.7.  Anticoagulation responsible provider: Riley Kill MD, Maisie Fus.  INR POC: 3.2.  Cuvette Lot#: [id number].  Exp: 01/2011.    Anticoagulation Management Assessment/Plan:      The patient's current anticoagulation dose is Coumadin 5 mg tabs: Take as directed by Anticoagulation Clinic.  The target INR is 2.0-3.0.  The next INR is due 02/04/2010.  Anticoagulation instructions were given to patient.  Results were reviewed/authorized by Cloyde Reams, RN, BSN.  He was notified by Cloyde Reams RN.         Prior Anticoagulation Instructions: INR 2.5 Continue 5mg s everyday except 7.5mg s on Wednesdays. Recheck in 4 weeks.   Current Anticoagulation Instructions: INR 3.2  Skip tomorrow's dosage of Coumadin, then resume same dosage 1 tablet daily except 1.5 tablets on Wednesdays.  Recheck in 3-4 weeks.

## 2010-04-30 NOTE — Progress Notes (Signed)
Summary: drug interaction   Phone Note Call from Patient Call back at Home Phone [phone]   Caller: Patient Reason for Call: Talk to Nurse, Talk to Doctor Summary of Call: pt was wondering about avalox interacting with other meds he is taking because his PCP gave him a Rx for this and he wants to discuss this with you prior to getting it filled Initial call taken by: Omer Jack,  September 23, 2009 1:09 PM  Follow-up for Phone Call        discussed with Dr [name] he says it should be okay to take the Avelox.  Pt's wife aware and will let the pt know. Dennis Bast, RN, BSN  September 23, 2009 2:53 PM

## 2010-04-30 NOTE — Medication Information (Signed)
Summary: Coumadin Clinic   Anticoagulant Therapy  Managed by: Weston Brass, PharmD Referring MD: Lewayne Bunting PCP: Dr.William Cleophus Molt MD: Daleen Squibb MD, Maisie Fus Indication 1: Atrial Fibrillation (ICD-427.31) Lab Used: [facility] [place] Site: [address] PT 21.0 INR POC 2.0 INR RANGE 2 - 3  Dietary changes: no    Health status changes: no    Bleeding/hemorrhagic complications: no    Recent/future hospitalizations: no    Any changes in medication regimen? no    Recent/future dental: no  Any missed doses?: no       Is patient compliant with meds? yes       Allergies: 1)  ! Sulfa  Anticoagulation Management History:      His anticoagulation is being managed by telephone today.  Positive risk factors for bleeding include an age of 75 years or older.  The bleeding index is 'intermediate risk'.  Positive CHADS2 values include History of HTN and Age > 39 years old.  The start date was 09/16/2006.  His last INR was 2.7.  Prothrombin time is 21.0.  Anticoagulation responsible provider: Daleen Squibb MD, Maisie Fus.  INR POC: 2.0.  Exp: 12/2010.    Anticoagulation Management Assessment/Plan:      The patient's current anticoagulation dose is Coumadin 5 mg tabs: Take as directed by Anticoagulation Clinic.  The target INR is 2.0-3.0.  The next INR is due 05/18/2010.  Anticoagulation instructions were given to patient.  Results were reviewed/authorized by Weston Brass, PharmD.  He was notified by Weston Brass PharmD.         Prior Anticoagulation Instructions: Cont current regimen Return to clinic on Jan 20th. Contact us when you return  Current Anticoagulation Instructions: INR 2.0  Spoke with pt.  Continue same dose of 1 tablet every day except  1 1/2 tablets on Wednesday.  Recheck INR in 4 weeks in [place].

## 2010-05-18 DIAGNOSIS — I4891 Unspecified atrial fibrillation: Secondary | ICD-10-CM

## 2010-05-20 ENCOUNTER — Encounter: Payer: Self-pay | Admitting: Cardiovascular Disease

## 2010-05-20 LAB — CONVERTED CEMR LAB
POC INR: 2.2
Prothrombin Time: 22.7 s

## 2010-05-26 NOTE — Medication Information (Signed)
Summary: Coumadin Clinic   Anticoagulant Therapy  Managed by: Windell Hummingbird, RN Referring MD: Lewayne Bunting PCP: Dr.William Cleophus Molt MD: Eden Emms MD, Theron Arista Indication 1: Atrial Fibrillation (ICD-427.31) Lab Used: [facility] [place] Site: [address] PT 22.7 INR POC 2.2 INR RANGE 2 - 3  Dietary changes: no    Health status changes: no    Bleeding/hemorrhagic complications: no    Recent/future hospitalizations: no    Any changes in medication regimen? no    Recent/future dental: no  Any missed doses?: no       Is patient compliant with meds? yes       Allergies: 1)  ! Sulfa  Anticoagulation Management History:      His anticoagulation is being managed by telephone today.  Positive risk factors for bleeding include an age of 75 years or older.  The bleeding index is 'intermediate risk'.  Positive CHADS2 values include History of HTN and Age > 39 years old.  The start date was 09/16/2006.  His last INR was 2.7.  Prothrombin time is 22.7.  Anticoagulation responsible provider: Eden Emms MD, Theron Arista.  INR POC: 2.2.    Anticoagulation Management Assessment/Plan:      The patient's current anticoagulation dose is Coumadin 5 mg tabs: Take as directed by Anticoagulation Clinic.  The target INR is 2.0-3.0.  The next INR is due 06/17/2010.  Anticoagulation instructions were given to patient.  Results were reviewed/authorized by Windell Hummingbird, RN.  He was notified by Windell Hummingbird, RN.         Prior Anticoagulation Instructions: INR 2.0  Spoke with pt.  Continue same dose of 1 tablet every day except  1 1/2 tablets on Wednesday.  Recheck INR in 4 weeks in [place].   Current Anticoagulation Instructions: INR 2.2 Pt. had INR drawn in [place].  Sp. w/ wife w/ results and instructions for pt. to continue taking 1 tablet every day, except take 1.5 tablets on Wednesdays.  To recheck in 4 weeks in [facility] and have results faxed. Windell Hummingbird  May 20, 2010 9:01 AM

## 2010-06-15 LAB — POCT URINALYSIS DIP (DEVICE)
Glucose, UA: NEGATIVE mg/dL
Specific Gravity, Urine: 1.02 (ref 1.005–1.030)
Urobilinogen, UA: 0.2 mg/dL (ref 0.0–1.0)

## 2010-06-22 ENCOUNTER — Encounter: Payer: Self-pay | Admitting: Internal Medicine

## 2010-06-22 LAB — PROTIME-INR: INR: 2.4 — AB (ref <=1.1)

## 2010-06-23 ENCOUNTER — Ambulatory Visit: Payer: Self-pay | Admitting: Cardiovascular Disease

## 2010-06-23 NOTE — Patient Instructions (Signed)
INR 2.4  Spoke with pt.  Continue same dose of 1 tablet every day except 1 1/2 tablets on Wednesday.  Recheck INR in 4 weeks in office.

## 2010-07-22 ENCOUNTER — Encounter: Payer: Self-pay | Admitting: *Deleted

## 2010-07-22 ENCOUNTER — Ambulatory Visit (INDEPENDENT_AMBULATORY_CARE_PROVIDER_SITE_OTHER): Payer: Medicare Other | Admitting: *Deleted

## 2010-07-22 DIAGNOSIS — I4891 Unspecified atrial fibrillation: Secondary | ICD-10-CM

## 2010-08-11 NOTE — Assessment & Plan Note (Signed)
[facility]                                 ON-CALL NOTE   [name], [name]                       MRN:          [id number]  DATE:08/17/2007                            DOB:          09/01/1924    PRIMARY CARDIOLOGIST:  Dr. Lewayne Bunting.   PRIMARY CARE PHYSICIAN:  Kari Baars, M.D.   I received a phone call from Carney Bern at [facility].  The patient apparently  is wearing a LifeWatch telemetry device secondary to the patient's  history of atrial fibrillation with RVR.  He was discharged recently  from [facility] on Jul 31, 2007, on Cardizem 180 mg daily and  flecainide 100 mg twice a day.  The patient was apparently in a rapid  atrial fib according to LifeWatch with a heart rate of 160, and they  called him and he said that he did feel some palpitations.  I have  followed up with a phone call to [name], and he states that he is  feeling fine.  He occasionally feels palpitations.  He is a retired  Development worker, community and he was able to check his pulse, and he says it is  irregular, but it is between 80 and 90.  He is asymptomatic and states  that he will give [place] a call if he becomes symptomatic or begins to feel  bad.  I advised him that if he does, please feel free to call us back at  our number on-call, and we will make further recommendations to him.  At  this time, the patient states he feels fine and he appreciated the call.  There are no changes in his medication and he will follow up with Dr.  Lewayne Bunting in the office on a previously scheduled appointment.     Bettey Mare. Lyman Bishop, NP  Electronically Signed    KML/MedQ  DD: 08/17/2007  DT: 08/17/2007  Job #: [id number]

## 2010-08-11 NOTE — Assessment & Plan Note (Signed)
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  He is a very pleasant 77 year old  man with a history of  atrial tachycardia and fibrillation who we placed  on Flecainide therapy several months.  He returns today for followup.  He continues on his Coumadin.  The patient denies chest pain.  He denies  shortness of breath.  He has had no palpitations since we got him on  Flecainide.  He is asking today whether he can do home Coumadin  monitoring.  The patient otherwise had no specific complaints.   On physical examination, he is a pleasant, well-appearing man in no  distress.  Blood pressure 122/84, the pulse 78 and regular, respirations  16.  The weight was 183 pounds.  NECK:  No jugular venous distention.  LUNGS:  Clear bilaterally to auscultation.  No wheezing, rales, or  rhonchi were present.  CARDIOVASCULAR:  Regular rate and rhythm, normal S1 and S2.  There are  no murmurs, rubs or gallops appreciated.  EXTREMITIES:  Demonstrated no cyanosis, clubbing or edema.  The pulses  are 2+ and symmetric.   MEDICATIONS:  Advair, Flonase, Finasteride, Zyrtec, Singulair, Prilosec,  Celebrex 200 a day, Flecainide 100 twice a day, and warfarin as  directed.   His EKG demonstrates sinus rhythm and normal axis and intervals.   IMPRESSION:  1. Atrial fibrillation (paroxysmal).  2. Chronic Flecainide therapy.  3. Chronic Coumadin therapy.   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  I note that he has been  tentatively set up to undergo colonoscopy later this month and think  that he will be able to come off Coumadin for 5 days prior to this.  I  have asked that he start Coumadin back on the day of his procedure.  In  addition today, he asked me about taking  Ciprofloxacin which he has self  administered in the past, for prostate problems, and as he is coming off  his Coumadin I think it will be acceptable to go ahead and take a 7-day  course of Cipro as he has done on his own in the past.     Doylene Canning. [name], MD  Electronically Signed    GWT/MedQ  DD: 01/04/2007  DT: 01/04/2007  Job #: [id number]   cc:   Venita Lick. Russella Dar, MD, Clementeen Graham

## 2010-08-11 NOTE — Assessment & Plan Note (Signed)
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  He is a very pleasant 77 year old  man who recently was hospitalized with a small bowel obstruction and  subsequently developed recurrent atrial fibrillation.  Unfortunately  this has been persistent despite being back on flecainide.  Of note, the  patient had had to stop flecainide for a period of time secondary to his  bowel obstruction.  Prior to this he had maintained sinus rhythm very  nicely.  He returns today for followup and notes that he has been  wearing a cardiac monitor which has shown periods of atrial fib as well  as atrial tachycardia with transition back to sinus rhythm at times.  The patient is here today for additional evaluation.  He thinks he is  out of rhythm.  He has had no frank syncope.   MEDICATIONS:  1. Include diltiazem 120 a day.  2. Advair 250/50 twice a day.  3. Flonase daily.  4. Zyrtec.  5. Singulair.  6. Prilosec 20 mg a day.  7. Ambien p.r.n. 10 mg a day.  8. Flecainide 100 twice a day.  9. Warfarin as directed.   PHYSICAL EXAM:  GENERAL:  He is a pleasant well-appearing 77 year old  man in no acute distress.  VITAL SIGNS:  The blood pressure today was 100/68, the pulse was 80 and  irregularly irregular, respirations were 18.  Weight was 172 pounds.  NECK:  Revealed no jugular venous distention.  LUNGS:  Clear bilaterally to auscultation.  There were no wheezes, rales  or rhonchi on exam today.  There is no increased work of breathing.  CARDIAC:  Exam revealed irregular rhythm with normal S1 and S2.  It is  not appreciate a split S2.  There are no obvious murmurs.  ABDOMEN:  Soft, nontender.  There is no organomegaly.  The bowel sounds  are present.  There is no  rebound or guarding.  EXTREMITIES:  Demonstrated no cyanosis, clubbing or edema.  Pulses were  2+ and symmetric.   EKG demonstrates atrial fibrillation with a controlled ventricular  response.   IMPRESSION:  1. Paroxysmal now becoming more persistent atrial fibrillation.  2. History of small bowel obstruction.  3. Underlying lung disease with asthma.   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  I think we should start him  back on his beta-blocker and stop his Cardizem.  I have asked that he  increase his flecainide from 100 twice a day to 150 twice a day.  With  all of the above he needs a 2-D echo and he needs an exercise treadmill  test because of up titration of his flecainide and I will arrange all  this.  Finally we talked about other treatment options with regard to a  strategy of rate control for which we  would stop flecainide and we also  talked about consideration of amiodarone which I think at this point  would not be indicated though would be a consideration down the road.     Doylene Canning. [name], MD  Electronically Signed    GWT/MedQ  DD: 08/23/2007  DT: 08/23/2007  Job #: [id number]   cc:   Kari Baars, M.D.

## 2010-08-11 NOTE — Assessment & Plan Note (Signed)
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  He was hospitalized with a small bowel  obstruction several weeks ago and at that time was unable to take his  flecainide and developed recurrent atrial fib.  He subsequently improved  from this and was started back on his flecainide and beta-blockers, but  unfortunately developed severe bradycardia on this medical therapy.  The  patient, as such, has been maintained on flecainide and underwent  exercise treadmill testing with no significant proarrhythmia.  He now  returns today for followup.  The patient notes that since his exercise  test, he started back on low-dose beta-blocker (metoprolol 12.5) once or  twice daily and his palpitations have improved, and he feels like he is  for the most part in sinus rhythm.  He has had no significant  symptomatic AFib.  The patient has had no syncope.  He denies chest pain  or shortness of breath.   MEDICATIONS:  1. Diltiazem 120 a day.  2. Flecainide 100 twice a day.  3. Warfarin as directed.  4. Multiple inhalers.  5. Metoprolol 12.5 mg once or twice daily.   PHYSICAL EXAMINATION:  GENERAL:  He is a pleasant well-appearing elderly  man in no acute distress.  Blood pressure today was 122/81, pulse 81 and  regular, respirations were 18, weight was 173 pounds.  NECK:  No jugular venous distention.  LUNGS:  Clear bilaterally to auscultation.  No wheezes, rales, or  rhonchi.  CARDIOVASCULAR:  Regular rate and rhythm with normal S1 and S2.  There  are no murmurs, rubs, or gallops today of note.  EXTREMITIES:  No cyanosis, clubbing, or  edema.   IMPRESSION:  1. Paroxysmal atrial fibrillation.  2. Sinus bradycardia, now both well controlled.  3. Underlying lung disease.   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  I have recommended a period of watchful waiting.  Specifically, I do not think that he requires  pacemaker at this time though ultimately he may end up requiring one if  he ultimately requires more beta-blocker.  We will see him back in  several months.     Doylene Canning. [name], MD  Electronically Signed    GWT/MedQ  DD: 09/18/2007  DT: 09/19/2007  Job #: [id number]

## 2010-08-11 NOTE — Procedures (Signed)
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   HISTORY:  The patient is a very pleasant 77 year old man with a long  history of tachy-brady syndrome, paroxysmal atrial fibrillation, who has  developed more and more frequent episodes of atrial fibrillation.  He  has been on escalating doses of flecainide and is now on 150 mg twice a  day, and is referred back for exercise treadmill testing.  He was seen  in our office several days ago and was found to be bradycardic, with  heart rates in the high 30s and low 40s -- though he was asymptomatic.  At that time we decided to discontinue his metoprolol 25 mg twice a day  but continue his flecainide.  He returns today for exercise treadmill  test.   PROCEDURE:  After informed consent was obtained, the patient was prepped  in the usual manner.  His initial resting heart rate was in the high 90s  and his blood pressure 108/60.  The patient walked through 2 full stages  of the Bruce protocol; it went for a total of 6 minutes.  He achieved 7  minutes of exercise.  His peak heart rate increased to 140 beats per  minute, which was 100% of predicted.  During the exercise there were no  acute ST-T wave changes.  The patient did have up to 7 beats of  nonsustained VT.   He subsequently was returned to the supine position.  In recovery there  were no arrhythmias induced.  He remained in atrial fibrillation during  the entirety of the procedure.   COMPLICATIONS:  There were no immediate procedural complications.   RESULTS:  This demonstrates a clinically and electrically negative  exercise treadmill test.  The patient did have up to 7 beats  of  nonsustained VT, which were asymptomatic during exercise.  In my opinion  this does not constitute and exercise-induced proarrhythmia.   He will be instructed to restart low-dose metoprolol in conjunction with  his flecainide.  He will be followed clinically.     [name] Canning. [name], MD  Electronically Signed    GWT/MedQ  DD: 08/31/2007  DT: 08/31/2007  Job #: [id number]

## 2010-08-11 NOTE — Procedures (Signed)
[facility]                              EXERCISE TREADMILL   NAME:IMBUSKamal, [name]                       MRN:          [id number]  DATE:10/24/2006                            DOB:          12/24/1924    #1.  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  He has been started on flecainide  to help reduce the amount of his arrhythmia and he is now here for  exercise treadmill test to rule out pro-arrhythmia.  The patient has no  known coronary disease.   #2.  Procedure:  After informed consent was obtained, the patient was  prepped in the usual manner.  Initial heart rate and blood pressure were  80 and 130/90, respectively.  He subsequently walked completing 3 stages  of a Bruce protocol.  His blood pressure increased up to 187/76 during  exercise and his heart rate increased to 123 beats per minute.  He had  very rare PVCs.  In recovery, his blood pressure returned back to normal  for him as did his heart rate.  There were no EKG changes consistent  with ischemia.   #3.  Complications:  There were no major complications.   #4:  Results:  This demonstrates an electrically and clinically negative  treadmill exercise test with no inducible arrhythmias noted.  The  patient exercised 10 mets.     Doylene Canning. [name], MD  Electronically Signed    GWT/MedQ  DD: 10/24/2006  DT: 10/24/2006  Job #: [id number]   cc:   Titus Dubin. Alwyn Ren, MD,FACP,FCCP

## 2010-08-11 NOTE — Op Note (Signed)
NAMESAURABH, [name] NO.:  [id number]   MEDICAL RECORD NO.:  [id number]          PATIENT TYPE:  INP   LOCATION:  2033                         FACILITY:  [facility]   PHYSICIAN:  Wilmon Arms. Corliss Skains, M.D. DATE OF BIRTH:  09/17/1924   DATE OF PROCEDURE:  07/24/2007  DATE OF DISCHARGE:                               OPERATIVE REPORT   PREOPERATIVE DIAGNOSIS:  Small bowel obstruction.   POSTOPERATIVE DIAGNOSIS:  Small bowel obstruction.   PROCEDURE PERFORMED:  1. Exploratory laparotomy with lysis of adhesions.  2. Repair of internal hernia.   SURGEON:  Wilmon Arms. Corliss Skains, MD, FACS   ASSISTANT:  Gabrielle Dare. Janee Morn, MD   ANESTHESIA:  General endotracheal.   INDICATIONS:  The patient is an 77 year old male who had a left  hemicolectomy for diverticular disease in 2003 by Dr. Luretha Murphy.  He presented for admission on July 20, 2007, by Dr. Buren Kos after  having severe abdominal pain, nausea, and vomiting.  A CT scan showed  what appeared to be an obstruction in the left lower quadrant.  It was  felt that this was a partial obstruction.  The patient was managed with  conservative measures with a nasogastric tube for decompression and IV  hydration.  The patient has not improved much over the last couple days.  He has a very quite abdomen and remains distended and has increasing  nasogastric output.  The decision was made to proceed for laparotomy  today.   DESCRIPTION OF PROCEDURE:  The patient was brought to the operating room  and placed in the supine position on the operating table.  After an  adequate level of general anesthesia was obtained, a Foley catheter was  placed under sterile technique.  The patient's abdomen was prepped with  Betadine and draped in sterile fashion.  We opened his previous midline  incision with a scalpel.  This was extended slightly superiorly.  Dissection was carried down to the fascia.  The previous fascial closure  sutures were  removed.  We dissected carefully into the fascia.  Entered  the peritoneal space.  The omentum was adhered to the posterior surface  of the abdominal wall.  This was dissected away with blunt dissection as  well as cautery.  We opened up the fascial opening throughout the length  of the incision.  The omentum was dissected free from the anterior  abdominal wall.  The patient had a lot of dilated small bowel  proximally.  He also had decompressed small bowel distally.  I found a  very tight adhesive band in the left lower quadrant which was lysed with  cautery.  This seemed to relieve the obstruction.  I then began to  examine his entire small bowel.  As I began to run the small bowel, it  appeared that the patient's small bowel had herniated through a small  defect in the mesentery of the left colon.  The patient has an  anastomosis between his distal transverse colon and sigmoid.  All the  small bowel which had previously been in the right side of his  abdomen  had herniated through this small defect into the left side of his  abdomen.  His entire colon was thus pushed towards the right.  The  defect was fairly tight and seemed in imminent danger of the obstructing  his terminal ileum, as the cecum remained in the right lower quadrant.  Therefore, the small bowel was all reduced back through this hernia  defect to the right.  This appeared to be much more anatomic as we were  able to clearly visualize the ligament of Treitz.  We milked back a lot  of small bowel contents back into the stomach were suctioned out with  the nasogastric tube.  We again examined the small bowel and the colon.  The remainder of this appeared normal.  We closed the mesenteric defect  with three figure-of-eight 2-0 silk sutures.  The abdomen was then  irrigated.  The fascia was closed with double-stranded #1 PDS suture.  The subcutaneous tissues were irrigated.  Staples were used to close the  skin.  The patient  was then extubated and brought to recovery room in  stable condition.  All sponge, instrument, and needle counts were  correct.      Wilmon Arms. Tsuei, M.D.  Electronically Signed     MKT/MEDQ  D:  07/24/2007  T:  07/25/2007  Job:  [id number]

## 2010-08-11 NOTE — Assessment & Plan Note (Signed)
[facility]                         ELECTROPHYSIOLOGY OFFICE NOTE   KOLBY, SCHARA                       MRN:          [id number]  DATE:07/03/2007                            DOB:          1925-03-24    HISTORY OF PRESENT ILLNESS:  Mr. Grater returns today for follow-up.  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  He had been stable.  He spends 3 months out of the year  down in [place] where he was again this year.  The patient states  that he had an episode of atrial fibrillation which ultimately resulted  in a one night stay in the hospital.  He was ultimately discharged home  with the advice that he would likely someday need a pacemaker.  He  returns today for follow-up.   MEDICATIONS:  1. Warfarin as directed.  2. Flecainide 100 twice a day.  3. multiple vitamins.  4. Allergy release medications.  5. Prilosec.   PHYSICAL EXAMINATION:  GENERAL:  On exam, he is a pleasant, well-  appearing man in no acute distress.  VITAL SIGNS:  Blood pressure was 122/72, pulse was 75 and regular,  respirations were 18, weight was 191 pounds.  NECK:  Revealed no jugular distention.  LUNGS:  Clear bilaterally auscultation.  No wheezes, rales or rhonchi  are present.  CARDIOVASCULAR:  Regular rhythm with normal S1-S2.  EXTREMITIES:  Demonstrated no cyanosis, clubbing or edema.  Pulses 2+  symmetric.   STUDIES:  EKG today demonstrates sinus rhythm with incomplete right  bundle branch block.   IMPRESSION:  1. His paroxysmal atrial fibrillation.  2. Chronic Coumadin therapy secondary to #1.  3. Sinus bradycardia, presently asymptomatic.   DISCUSSION:  Overall, Mr. Stracener is stable.  He is no longer bradycardic.  He may ultimately require pacemaker, but presently does not have  indication for one.  I have recommend he continue his  flecainide and his  Coumadin, and I will see him back in the office in several months.     Doylene Canning. [name], MD  Electronically Signed   GWT/MedQ  DD: 07/03/2007  DT: 07/04/2007  Job #: [id number]

## 2010-08-11 NOTE — Assessment & Plan Note (Signed)
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 I saw him back  several weeks ago and recommended flecainide and Coumadin which after  coming back from a trip to [place] he has started up. He has had no  palpitations since and overall feels well. The patient is concerned  about taking Coumadin and has multiple reasons not to.   PHYSICAL EXAMINATION:  GENERAL:  He is a pleasant, well-appearing,  elderly man in no distress.  VITAL SIGNS:  Blood pressure 122/88, pulse 88 and regular, respirations  18, weight was 181 pounds.  NECK:  Revealed no jugular venous distention.  LUNGS:  Clear bilaterally to auscultation. There were no wheezes, rales  or rhonchi.  CARDIOVASCULAR:  Regular rate and rhythm with normal S1 and S2. No  murmurs, rubs or gallops were present.  EXTREMITIES:  Demonstrated no edema.   IMPRESSION:  1. Paroxysmal atrial fibrillation.  2. Hypertension.  3. Flecainide therapy.   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 He is considering this. I think he  will stay on Coumadin though he is considering aspirin as well which I  have instructed him is not as good at preventing stroke. With regard to  his flecainide, he has maintained sinus rhythm very nicely by his  symptoms. We will have him come in for a trough flecainide level as he  is on 100 twice a day and will have him undergo exercise treadmill  testing to rule out proarrhythmia on flecainide.     Doylene Canning. [name], MD  Electronically Signed    GWT/MedQ  DD: 09/08/2006   DT: 09/09/2006  Job #: [id number]

## 2010-08-11 NOTE — Assessment & Plan Note (Signed)
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  He is a very pleasant 77 year old  male with paroxysmal atrial fibrillation, who has been maintained fairly  nicely in sinus rhythm on combination of Cardizem p.r.n., beta-blockers,  and flecainide at 100 mg twice a day.  He returns today for followup.  He has done well.  He denies palpitations or chest pain.  He has been,  otherwise, stable.   MEDICATIONS:  1. Diltiazem CD 120 a day.  2. Prilosec 20 a day.  3. Advair 250/50 b.i.d.  4. Flonase inhaler.  5. Finasteride 5 mg daily.  6. Warfarin as directed.  7. Flecainide 100 twice a day.  8. He is also on Crestor 5 mg daily.  9. He has also been taking Celebrex 200 a day.   PHYSICAL EXAMINATION:  GENERAL:  He is a pleasant well-appearing elderly  man in no distress.  VITAL SIGNS:  Blood pressure was 132/74, pulse was 50 and regular, the  respirations were 18, and the weight was 175 pounds.  NECK:  No jugular venous distention.  LUNGS:  Clear bilaterally to auscultation.  No wheezes, rales, or  rhonchi are present.  There is no increased work of breathing.  CARDIOVASCULAR:  Regular rate and rhythm with normal S1 and S2.  PMI was  not enlarged and laterally displaced.  ABDOMEN:  Soft and nontender.  There is no organomegaly.  EXTREMITIES:  No edema.   EKG demonstrated sinus rhythm with marked first-degree AV block and  incomplete right bundle branch block.   IMPRESSION:  1. Paroxysmal atrial fibrillation.  2. Asymptomatic bradycardia.  3. Palpitations, secondary to paroxysmal atrial fibrillation.   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  His PR interval has prolonged in the last year, but he has had  no  symptomatic evidence of heart block.  I have  given him warnings about possible symptoms that can occur if he develops  heart block, and we will plan to see the patient back in the office in 6  months for followup.     [name] Canning. [name], MD  Electronically Signed    GWT/MedQ  DD: 01/03/2008  DT: 01/03/2008  Job #: [id number]   cc:   Kari Baars, M.D.

## 2010-08-11 NOTE — Assessment & Plan Note (Signed)
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  He is a very pleasant 77-year-  old man whose health has been quite good, but he does have mild  hypertension.  He has developed both atrial tachycardia, as well as  atrial fibrillation.  He returns today for follow up.  He has been  maintained mostly in sinus rhythm on his beta blocker, but does feel  some mild fatigue and weakness on this.  I initially had recommended  that he start Coumadin, but he has recently been on a long trip, and we  decided to wait until he came back.  He returns today for follow up.   PHYSICAL EXAMINATION:  GENERAL:  On exam today, he is a pleasant, well-  appearing man in no acute distress.  VITAL SIGNS:  The blood pressure today was 146/78, pulse 72 and regular,  respirations 18.  The weight was 182 pounds.  NECK:  No jugular venous distention.  There was no thyromegaly.  The  trachea was midline.  The carotids were 2+ and symmetric.  LUNGS:  Clear bilaterally to auscultation.  No wheezing, rales, or  rhonchi.  CARDIOVASCULAR:  Regular rate and rhythm with normal S1 and S2.  EXTREMITIES:  No clubbing, cyanosis, or edema.  NEUROLOGIC:  Alert and oriented x3 with cranial nerves intact.   ELECTROCARDIOGRAM:  Sinus rhythm with first degree AV block.   MEDICATIONS:  1. Advair.  2. Flonase.  3. Finasteride.  4. Zyrtec.  5. Singulair.  6. Prilosec.  7. Ambien p.r.n.  8. Metoprolol 25 mg 1-3 tablets daily.  9. Aspirin 325 a day.   IMPRESSION:  1. Paroxysmal atrial fibrillation and atrial tachycardia.  2. Hypertension.  3. Allergic rhinitis and other allergies.   DISCUSSION:  Today we spent nearly 20 minutes discussing the pros and  cons of antiarrhythmic drug therapy, as  well as the initiation and  reason for recommendation for Coumadin therapy.  The patient has been  somewhat recalcitrant to taking Coumadin in the past, but I have warned  him about the risk of stroke, and for this reason he seems amenable for  starting Coumadin.  I have also talked to him about antiarrhythmic drug  therapy to maintain him in sinus rhythm, as he has had some fatigue on  his beta blockers.  To this end, I would recommend that we start him on  flecainide.  The patient states that in 2 weeks he is taking a long trip  to [place] where he will be out in the wilderness fishing for nearly a  week.  For this reason, I have recommended that we hold off on  flecainide and Coumadin until he comes back, and that he stays on his  beta blocker and his aspirin until then.  Will see him back in several  weeks.  I have given him a  prescription today for flecainide and  Coumadin both, and will anticipate starting these drugs once he gets  back from his [place] fishing trip.     Doylene Canning. [name], MD  Electronically Signed    GWT/MedQ  DD: 08/04/2006  DT: 08/04/2006  Job #: [id number]

## 2010-08-11 NOTE — Discharge Summary (Signed)
[name], [name] NO.:  [id number]   MEDICAL RECORD NO.:  [id number]          PATIENT TYPE:  INP   LOCATION:  2033                         FACILITY:  [facility]   PHYSICIAN:  [name], M.D.  DATE OF BIRTH:  12/10/1924   DATE OF ADMISSION:  07/20/2007  DATE OF DISCHARGE:                               DISCHARGE SUMMARY   DISCHARGE DIAGNOSES:  1. Small bowel obstruction secondary to internal intestinal hernia,      status post exploratory laparotomy with adhesiolysis (July 24, 2007).  2. Paroxysmal atrial fibrillation with rapid ventricular response on      chronic anticoagulation therapy.  3. Chronic obstructive pulmonary disease/Loeffler's syndrome      exacerbation.  4. Severe protein-calorie malnutrition, requiring total nutrient      admixture, improved.  5. Impaired fasting glucose with steroid-induced hyperglycemia.  6. Diverticulitis, status post partial colectomy (2003).  7. Hypertension.  8. Hyperlipidemia.  9. Benign prostatic hypertrophy.  10.Status post bilateral inguinal hernia repair (1942 and 1986).  11.Status post umbilical hernia repair.  12.Status post right total hip replacement (2004).  13.Status post lumbar laminectomy.  14.Status post rotator cuff surgery.  15.Status post appendectomy.   DISCHARGE MEDICATIONS:  1. Augmentin 875 mg b.i.d. x3 days for bronchitis.  2. Diltiazem 180 mg daily.  3. Flecainide 100 mg b.i.d.  4. Advair 250/50 b.i.d.  5. Medrol Dosepak x6 days.  6. Coumadin 5 mg daily - obtain an INR on Thursday with goal 2-3.  7. Xopenex every 4-6 h. p.r.n.  8. Proscar 5 mg daily.  9. Crestor 5 mg daily.  10.Singulair 10 mg daily.  11.Omeprazole 20 mg b.i.d.  12.Flonase nasal spray 1-2 times per day.  13.Zyrtec 10 mg daily.  14.Ambien 10 mg nightly.  15.Temazepam 15 mg nightly p.r.n.  16.Colace 100 mg b.i.d.  17.MiraLax 17 g daily p.r.n.   OPERATIVE PROCEDURES:  1. CT of the abdomen and pelvis without  contrast (July 20, 2007)      partial small bowel obstruction versus early complete obstruction      with transition point in the left lower quadrant where there are      surgical clips.  Ascending colon diverticulosis.  2. Exploratory laparotomy with lysis of adhesions and repair of an      internal hernia (July 24, 2007, by Dr. Manus [name]).  3. PICC line placement (July 24, 2007).  4. TNA nutrition.   HISTORY OF PRESENT ILLNESS:  Please see dictated history and physical.  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  The patient was in his usual state of health on the  morning prior to presentation but had to strain for bowel movements.  Later in the day he developed some diffuse intermittent abdominal pain.  The abdominal pain persisted throughout the day,  but he went to the gym.  After supper his pain became very severe so he came to the emergency  department at 1 a.m.  He described it is periumbilical with lateral  lower quadrant abdominal cramping.  It is associated with nausea without  vomiting.  He has not had  had any flatus.  In the emergency department,  he had a CT scan without contrast, which shows partial small bowel  obstruction versus early complete obstruction in the left lower  quadrant.  He was admitted for further management.   HOSPITAL COURSE:  The patient was admitted to a medical floor on  telemetry and developed subsequent nausea and vomiting with bilious  emesis and an NG tube was placed with copious NG output for several  days.  Conservative management was attempted with IV fluid hydration,  antiemetics, and low-dose narcotics.  At this  point, with conservative  management, his bowel function did not return and his plain x-rays  showed persistent small bowel obstruction.  Therefore, on the morning on  July 24, 2007, the decision was made to proceed with exploratory  laparotomy.  Intraoperative findings revealed a band of adhesions  in  the left lower quadrant which created an intestinal hernia.  Adhesiolysis was performed releasing the small bowel contents.  Postoperatively, the patient was managed on TNA for nutrition and  continued NG tube suction until his bowel function improved.   Postoperative course was complicated by paroxysmal atrial fibrillation  with rapid ventricular response.  He was continued initially on his  flecainide orally.  It appears that the flecainide was held for several  days and he subsequently developed  Atrial fibrillation with rapid  ventricular response, and we started him on Cardizem drip for rate  control.  Cardiology was consulted and his Flecainide was restarted.  He  has continued to have intermittent episodes of atrial fibrillation with  rapid ventricular response.  Once his INR was below 2.0 he was started  on a heparin drip and his Coumadin was resumed postoperatively.  His INR  is therapeutic at 2.0 and his heparin has been discontinued prior to  discharge.  At this point his heart rate has fluctuated up to 109, but  he is asymptomatic with no chest pain, shortness of breath, or  dizziness.  He will be seen by cardiology prior to discharge with  adjustments in medications if needed.   The patient also had an episode of reactive airways with significant  wheezing and shortness of breath postoperatively prior to return of  normal bowel function.  Chest x-ray showed some increased peribronchial  thickening.  His Advair was increased and he was started on Solumedrol  and Rocephin empirically.  With this treatment his shortness of breath  improved.  His IV steroids have been weaned to  po Medrol.  He is back to  baseline at this point from a respiratory standpoint and he will  complete Medrol and p.o. antibiotics with Augmentin.  He is now stable  for discharge home.   DISCHARGE LABORATORY:  CBC shows a white count of 14.2, BUN 24,  creatinine 1.4, glucose 86.  INR is 2.0.   DISCHARGE DIET:  Cardiac prudent .   DISPOSITION:  To home.   Follow up at [facility] in 3-4 days for an INR check  and labs.  He should also follow up with Dr. [name], Cardiology and  Tsuei as directed.      [name], M.D.  Electronically Signed  WS/MEDQ  D:  07/31/2007  T:  07/31/2007  Job:  [id number]   cc:   Doylene Canning. [name], MD  Wilmon Arms. Tsuei, M.D.

## 2010-08-11 NOTE — Assessment & Plan Note (Signed)
[facility]                         GASTROENTEROLOGY OFFICE NOTE   [name], [name]                       MRN:          [id number]  DATE:12/05/2006                            DOB:          15-Aug-1924    CHIEF COMPLAINT:  77 year old white male returning for followup of a  history of adenomatous colon polyps and diverticulosis who is maintained  on Coumadin anticoagulation.   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  He underwent  a left hemicolectomy by Dr. Molli Hazard B. Daphine Deutscher in July 2002, with the  findings of sigmoid diverticulitis with a peri-diverticular abscess.  He  relates no further problems with diverticulitis since that time.  He has  had an occasional problem with gas, bloating and some mild urgent loose  stools.  These symptoms have been relatively minimal.  He notes no  change in stool caliber, melena or hematochezia.  He has a history of a  supraventricular tachycardia, and this spring developed atrial  fibrillation.  He was evaluated by Dr. Doylene Canning. [name] and placed on  Coumadin anticoagulation.  Apparently he has remained in normal sinus  rhythm with occasional PVCs since that time.   PAST MEDICAL/SURGICAL HISTORY:  1. Diverticulosis.  2. Adenomatous colon polyps.  3. Status post left hemi-colectomy for diverticular disease.  4. Status post right hip replacement.  5. Status post right shoulder surgery.  6. Status post appendectomy.  7. Status post hernia repairs.  8. History of supraventricular tachycardia.  9. History of atrial fibrillation.  10.Chronic Coumadin anticoagulation.  11.Allergic bronchopulmonary aspergillosis.  12.Status post laminectomy.  13.Hypertension.   CURRENT MEDICATIONS:  Listed on the chart, updated and reviewed.   ALLERGIES:  SULFA DRUGS.   SOCIAL HISTORY/REVIEW OF SYSTEMS:  Per the handwritten form.   PHYSICAL EXAMINATION:  GENERAL:  A well-developed and well-nourished  white male, in no acute distress.  VITAL SIGNS:  Height 5 feet 10 inches, weight 180.2 pounds, blood  pressure 120/78, pulse 76 and regular.  HEENT:  Anicteric sclerae.  Oropharynx clear.  CHEST:  Clear to auscultation bilaterally.  HEART:  A regular rate and rhythm without murmurs appreciated.  ABDOMEN:  Soft, nontender, non-distended.  Normoactive bowel sounds.  No  palpable organomegaly, masses or herniae.  RECTAL:  Deferred to time of colonoscopy.  EXTREMITIES:  Without clubbing, cyanosis or edema.  NEUROLOGIC:  Alert and oriented x3.  Grossly nonfocal.   ASSESSMENT/PLAN:  1. Personal history of adenomatous colon polyps:  The risks, benefits      and alternatives to colonoscopy with possible biopsy and possible      polypectomy performed off of Coumadin anticoagulation were      discussed with the patient and he consents to proceed.  This will      be scheduled electively.  Will contact Dr. Sharrell Ku regarding      clearance to hold Coumadin for five days prior to the procedure,      with plans  to resume Coumadin on the day of the procedure.  2. Status post left hemicolectomy in 2002 for recurrent      diverticulitis.  3. Celebrex usage in the setting of Coumadin anticoagulation:  I have      advised him to use Prilosec 20 mg daily, every day, for ulcer      prophylaxis.     Venita Lick. Russella Dar, MD, [facility]  Electronically Signed    MTS/MedQ  DD: 12/05/2006  DT: 12/06/2006  Job #: [id number]   cc:   Kari Baars, M.D.  Doylene Canning. [name], MD

## 2010-08-11 NOTE — Discharge Summary (Signed)
[name], [name] NO.:  [id number]   MEDICAL RECORD NO.:  [id number]          PATIENT TYPE:  INP   LOCATION:  2033                         FACILITY:  [facility]   PHYSICIAN:  Maple Mirza, PA   DATE OF BIRTH:  07-15-1924   DATE OF ADMISSION:  07/20/2007  DATE OF DISCHARGE:  07/31/2007                               DISCHARGE SUMMARY   ALLERGIES:  SULFA.   FINAL DIAGNOSES OF ADMISSION:  1. Small bowel obstruction discharging day 7 status post exploratory      laparotomy with lysis of adhesions.  2. Atrial fibrillation with rates of 90 to 110 beats per minute.      a.     Cardizem 180 mg daily.      b.     Flecainide 100 mg twice daily.   SECONDARY DIAGNOSES:  1. History of paroxysmal atrial fibrillation.  2. Diverticulitis.  3. Osteoarthritis.  4. Severe allergies.   PAST SURGICAL HISTORY:  1. Right shoulder arthroscopy.  2. Right hip total arthroplasty.  3. Laminectomy.  4. Bilateral inguinal hernia repair.  5. Ventral hernia repair.  6. Appendectomy.  7. Laparoscopic-assisted mobilization of the splenic flexure in the      left colon with opened left colectomy and transverse distal sigmoid      anatomosis.  The plan for Cardiology is to have the patient have 4 weeks of  therapeutic Coumadin and then after an office visit with Dr. [name]  presented to [facility] for direct current cardioversion unless  the patient converts to sinus rhythm prior to that.   DISCHARGE MEDICATIONS:  1. Augmentin 875 mg twice daily for 3 days.  2. Diltiazem 180 mg daily.  3. Flecainide 100 mg twice daily.  4. Advair 250/50 twice daily.  5. Medrol dose pack for 6 more days.  6. Coumadin 5 mg daily except for 7.5 mg on Wednesday.  7. Xopenex 2 puffs every 4-6 hours as needed.  8. Proscar 5 mg daily.  9. Crestor 5 mg daily.  10.Singulair 10 mg daily.  11.Omeprazole 20 mg twice daily.  12.Flonase nasal spray 1-2 sprays per day for narrowing.  13.Zyrtec 10 mg  daily.  14.Colace 100 mg twice daily.  15.MiraLax 17 g daily in 8 ounces of juice or water.  16.Ambien 10 mg at bedtime as needed.  17.Temazepam 15 mg at bedtime as needed.   He will present to the [facility] on [address]:  1. Friday, Aug 04, 2007, at 8:45.  2. Wednesday, Aug 09, 2007, at 10:15.  3. Wednesday, Aug 16, 2007, at 9:30.  4. Wednesday, Aug 23, 2007, at 8:45.  He will also see Dr. [name] at      9:15 on Aug 23, 2007.  If all goes well and the protimes are      therapeutic at each office visit and the patient remains in atrial      fibrillation and he will have cardioversion at [facility]      on Aug 24, 2007.   LABORATORY STUDIES ON THE DAY OF DISCHARGE:  Complete blood  count:  White cells 14.2, hemoglobin is 12.6, hematocrit 37.3, and platelets  250.  Sodium was 138, potassium 3.9, chloride 106, carbonate 29, BUN is  24, creatinine 1.16, and glucose 86.  Protime 23.4.  INR 2.0.      Maple Mirza, PA     GM/MEDQ  D:  07/31/2007  T:  08/01/2007  Job:  [id number]   cc:   Doylene Canning. [name], MD

## 2010-08-11 NOTE — Consult Note (Signed)
[name], [name] NO.:  [id number]   MEDICAL RECORD NO.:  [id number]          PATIENT TYPE:  INP   LOCATION:  2033                         FACILITY:  [facility]   PHYSICIAN:  Ardeth Sportsman, MD     DATE OF BIRTH:  02/03/1925   DATE OF CONSULTATION:  DATE OF DISCHARGE:                                 CONSULTATION   CARDIOLOGIST:  Dr. [name], Cardiology.   CONSULTING SURGEON:  Dr. Ardeth Sportsman.   REASON FOR CONSULTATION:  Partial small bowel obstruction.   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  The patient states that he was trying to have  the bowel movement yesterday, and while straining to do this, he began  to feel pain in his left lower quadrant region.  He has felt that at  this time, maybe has pulled a muscle and continued on with his day and  went to the gym.  Later on, he came home and ate dinner with worsening  left lower quadrant abdominal pain.  At this time, he began to get  somewhat nauseated and noticed that he was no longer passing gas or  having any bowel movement.  The patient woke up around 01:30 a.m. with  continued worsening abdominal pain, and at that time presented to the  ER.  Once in the ER, the patient had several bouts of emesis.  His white  blood cell count and his electrolytes were all normal.  CT of the  abdomen and pelvis showed a partial small bowel obstruction versus an  early complete obstruction.  It also showed a potential transition point  in the left lower quadrant.  Because of this finding, we were consulted  for possible future surgical intervention.   REVIEW OF SYSTEMS:  See HPI, otherwise he admits to having nausea or  vomiting and left quadrant abdominal  pain.  Otherwise, all other systems  are negative.   FAMILY HISTORY:  Noncontributory.   PAST MEDICAL HISTORY:  1. Paroxysmal atrial fibrillation.  2. History of diverticulitis.  3. Osteoarthritis.  4. Severe allergies.   PAST SURGICAL HISTORY:  1. Right shoulder arthroscopy.  2. Right hip total arthroplasty.  3. Laminectomy.  4. Bilateral inguinal hernia repair.  5. Ventral hernia repair.  6. Appendectomy.  7. Laparoscopic-assisted mobilization of the splenic flexure in the      left colon with open left colectomy and transverse to distal      sigmoid anastomosis.   SOCIAL HISTORY:  The patient has been married to his wife for 53 years.  They have 5 kids and 9 grand kids.  The patient says that he does not  smoke and occasionally drinks approximately 7 glasses of wine per week.  He is also a retired Development worker, community.   ALLERGIES:  NKDA.   MEDICATIONS:  While in the hospital he  is being given,  1. Morphine 2 mg IV q.2 h. p.r.n.  2. Phenergan 12.5 mg p.r.n.  3. Flecainide 100 mg p.o. b.i.d.  4. Advair 250/50 one puff b.i.d.  5. Xopenex 1.25 mg nebulizer q.6 h. for wheezing.  6. Protonix 40 mg IV daily.  7. As well as Ambien 10 mg nightly.  There is no current listing of      all the patient's home medications, however, I do note the patient      does take Coumadin at home as well.   PHYSICAL EXAMINATION:  GENERAL:  This is a pleasant 77 year old white  male who is lying in bed and currently in no acute distress.  VITAL SIGNS:  Temperature 97, pulse 62, respirations 18, blood pressure  125/74.  HEENT:  Sclerae noninjected and nonicteric.  Pupils equal, round, and  reactive to light.  Ears and nose, no obvious lesions, masses, or  rhinorrhea.  Mouth was pink and moist.  Pharynx shows no exudate or  erythema.  NECK:  Trachea is midline.  Supple and no thyromegaly.  LUNGS:  Clear to auscultation bilaterally with no wheezes, rhonchi, or  rales.  The respiratory effort is  normal.  HEART:  Regular rate and rhythm.  Normal S1 and S2.  No murmurs,  gallops, or rubs were noted.  The patient is currently in sinus rhythm.  +2 bilateral carotid and pedal pulses.  CHEST:  Symmetrical.  ABDOMEN:  Soft and tender in the left lower quadrant and some  generalized tenderness.  His abdomen is mildly tympanitic with some mild  involuntary guarding.  Otherwise, the patient does not have any bowel  sounds and his abdomen is somewhat distended.  He does have several  scars noted from his prior abdominal surgeries.  He also has several  seborrheic keratoses noted on his abdomen as well.  MUSCULOSKELETAL:  All 4 extremities are symmetrical.  There was no  cyanosis, clubbing, or edema.  No joint pains.  SKIN:  There are no rashes, lesions, or masses.  However, multiple  seborrheic keratoses were noted on the patient's abdomen.  NEUROLOGIC:  The patient's cranial nerves II through XII appeared  grossly intact.  Deep tendon reflexes deferred at this time.  PSYCHIATRIC:  The patient is alert and oriented x3 with an appropriate  affect.   LABORATORY DATA AND DIAGNOSTICS:  White blood cell count is 10.1,  hemoglobin 14.8, creatinine is 42.1, platelets are 174,000 with  neutrophils of 70%.  LFTs are normal.  Sodium 139, potassium 3.6, BUN  20, creatinine 1.6.  PT is 30.8, INR is 2.8.  Lipase is 20, lactic acid  is 0.8.  Diagnostic CT scan shows a partial small bowel obstruction  versus a complete obstruction with a possible transition zone in the  left lower quadrant.   IMPRESSION:  1. Partial small bowel obstruction.  2. Paroxysmal atrial fibrillation.  3. Allergies.  4. History of diverticulitis.  5. Dehydration.   PLAN:  At this time, we agree with medical management with the patient  trying to get his bowels to open up on their own over the next several  of days.  At this time, we agree, if the patient begins to get nausea or  vomiting, then an NG tube be placed.  We  also agreed for repeat x-rays  as well as labs in the morning.  The patient also says that he takes  Ambien every night as this is not ordered and so I will order  him Ambien  10 mg nightly.  The patient is also written for currently to be on  bedrest with privileges.  However, with small bowel obstruction, it is  very important for the patient to have regular activity and therefore,  we will allow the patient to get out of bed to his chair and walk in the  halls ad lib.  We have also noticed that the patient's creatinine is  somewhat elevated slightly and therefore we will give the patient a  bolus of normal saline IV fluids.  Also, the patient's urine output is  less than 250 mL for 8 hours then another normal saline bolus to be  given as well.  Otherwise, if the patient does not begin to improve with  all these recommendations, then surgical intervention may be necessary  over the next couple of days.  This certainly needs to be done and his  Coumadin will continued to be held and if his INR is still elevated,  then we may need to reverse this before surgical intervention.  Any  further recommendations will be per Dr. Michaell Cowing after his evaluation of  the patient.      Letha Cape, PA      Ardeth Sportsman, MD  Electronically Signed    KEO/MEDQ  D:  07/20/2007  T:  07/21/2007  Job:  [id number]   cc:   Kari Baars, M.D.  Doylene Canning. [name], MD

## 2010-08-11 NOTE — Consult Note (Signed)
NAMESTEPHANIE, [name] NO.:  [id number]   MEDICAL RECORD NO.:  [id number]          PATIENT TYPE:  INP   LOCATION:  2033                         FACILITY:  [facility]   PHYSICIAN:  Jonelle Sidle, MD DATE OF BIRTH:  January 09, 1925   DATE OF CONSULTATION:  07/26/2007  DATE OF DISCHARGE:                                 CONSULTATION   REQUESTING PHYSICIAN:  Buren Kos, MD.   PRIMARY CARDIOLOGIST:  Doylene Canning. [name], MD.   REASON FOR CONSULTATION:  Paroxysmal atrial tachycardia.   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  He was just recently seen in the office  by Dr. [name] on April 6.  Overall, it sounds as if his rhythm has been  well controlled, although he did have a breakthrough of atrial  fibrillation when he was residing down in [place] part of the year  resulting in a one-night stay in the hospital.  He had some evidence of  sinus bradycardia at that time, although he has not required any  specific device therapy.  He is presently admitted to the hospital  following onset of abdominal pain with a subsequent diagnosis of a small-  bowel obstruction.  He was treated conservatively without improvement  and ultimately had to undergo an exploratory laparotomy with lysis of  adhesions and repair of intestinal hernia.  This occurred on April 27.  He has had brief episodes of recurrent supraventricular tachycardia, and  more recently this morning a more prolonged event.  In reviewing his  telemetry, he had evidence of atrial flutter with 2:1 block,  subsequently going to atrial fibrillation, and now normal sinus rhythm  with PAC's on intravenous diltiazem.  He has been maintained on heparin  as his Coumadin was discontinued for surgery.  In reviewing his MAR, I  do not see that he has been on flecainide recently.  Apparently, beta-  blocker  therapy was held due to some wheezing.  At the present time, he  is comfortable from a cardiac perspective.  We have been asked to assist  with management.   ALLERGIES:  SULFA DRUGS.   PRESENT MEDICATIONS:  1. Advair 1 puff b.i.d.  2. Lasix 20 mg IV daily.  3. Heparin infusion.  4. NovoLog sliding-scale.  5. Protonix 40 mg IV b.i.d.  6. Cardizem drip presently at 10 mg per hour following a 10 mg bolus.   At home, he was also taking:  1. Coumadin.  2. Flecainide 100 mg p.o. b.i.d.  3. Proscar 5 mg p.o. daily.  4. Celebrex 200 mg p.o. daily.  5. Crestor 5 mg p.o. daily.  6. Singulair 10 mg p.o. daily.  7. Zyrtec 10 mg p.o. daily.  8. Omeprazole 20 mg p.o. b.i.d.  9. Flonase nasal spray.   PAST MEDICAL HISTORY:  Was reviewed.  1. 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  2. Hypertension.  3. Hyperlipidemia.  4. Mild chronic obstructive pulmonary disease.  5. History of diverticulitis.  6. Benign prostatic hypertrophy.  7. Previous appendectomy.  8. Previous rotator cuff repair.  9. Previous lumbar laminectomy.  10.Previous right total hip replacement.  11.Previous bilateral inguinal hernia repairs.  12.He has impaired fasting glucose and is on no specific hypoglycemic      agents at this time.   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  He spends 3 months out of the year in [place],  [place].  It was at this time that he had his breakthrough of atrial  fibrillation.  He is married and has five children.  He has a history of  smoking two packs per day but quit in 1958.  He drinks alcohol  regularly, up to two fifths per week.   FAMILY HISTORY:  Was reviewed.  The patient's father died with lung  cancer at age 47.  The patient's mother died of a stroke at age 21.  He  has four brothers, one with cardiovascular disease and one with   congestive heart failure.   REVIEW OF SYSTEMS:  As outlined above.  He reports relatively good  control of atrial fibrillation with the exception of the breakthrough  event he had in [place].  He has been compliant with medications.  Has  no typical exertional chest pain or dyspnea on exertion.  Had no major  bleeding problems on Coumadin.  Otherwise, systems as reviewed on  History and Physical.   PHYSICAL EXAMINATION:  VITAL SIGNS:  Temperature is 98 degrees, heart  rate presently in the 70s and sinus rhythm with premature atrial  complexes, respirations 20, blood pressure 139/82, oxygen saturation is  95% on room air.  GENERAL:  Normally nourished appearing male in no acute distress.  HEENT:  Conjunctivae and lids normal.  Oropharynx grossly clear.  NECK:  Supple.  No elevated jugular venous pressure.  No loud bruits.  LUNGS:  Diminished rhonchorous breath sounds.  No active wheezing.  CARDIAC:  Exam reveals a somewhat irregular rhythm.  No pericardial rub  or S3 gallop.  Soft flow murmur at the base.  ABDOMEN:  Diminished bowel sounds. Midline incision appears to be  healing well.  No marked degree of tenderness on palpation.  No rebound.  EXTREMITIES:  Exhibit no frank pitting edema.  Distal pulses 1-2+.  SKIN:  Warm and dry.  MUSCULOSKELETAL:  No kyphosis noted.  NEUROPSYCHIATRIC:  The patient alert and oriented x3.  Affect is  appropriate.   LABORATORY DATA:  WBC 10.3, hemoglobin 12.7, hematocrit 37.7, platelets  162.  INR is 1.4.  Sodium 134, potassium 4.0, BUN 15, creatinine 0.89,  glucose 121. Total cholesterol was 65, triglycerides 44. Prealbumin 7.5,  albumin 2.1.   A chest x-ray from April 29 demonstrated right perihilar submental  atelectasis, possibly related to mucous plugging.   Abdominal films from April 27 showed a persistent small bowel  obstructive pattern.   Followup electrocardiogram now shows normal sinus rhythm with leftward  axis at a rate of 85 beats  per minute.   IMPRESSION:  1. History of paroxysmal atrial fibrillation.  Recent rhythm is most      consistent with atrial flutter with 2:1 block and subsequent atrial      fibrillation, now sinus rhythm on a diltiazem infusion.  The      patient is on a heparin infusion as well, ultimately planning to  transition back to Coumadin.  He has had no major bradycardia.  He      was temporarily on a beta blocker, although this was discontinued      given some wheezing on examination.  He is presently      hemodynamically stable.  It is not clear to me that he has been      receiving his flecainide regularly over the last few days, although      I am not certain whether or not he would be absorbing much of it      anyway with his small-bowel obstruction.  2. History of hypertension.  3. History of  hyperlipidemia.  4. Mild chronic obstructive pulmonary disease.  5. Small-bowel obstruction status post exploratory laparotomy with      lysis of adhesions and repair of intestinal hernia on April 27.   RECOMMENDATIONS:  I agree with institution of intravenous Cardizem and  continuing heparin as a bridge to Coumadin.  I will go ahead and resume  the patient's flecainide, although it is not clear that he will be  absorbing this normally until his small-bowel obstruction has completely  cleared.  If we have more difficulty controlling his rhythm, we could  consider intravenous amiodarone temporarily, although his rhythm appears  to be fairly stable now on diltiazem.  Would monitor for bradycardia in  that he has had reported problems with this in the past.  Maintain  electrolyte balance.  We will plan to follow with you.      Jonelle Sidle, MD  Electronically Signed     SGM/MEDQ  D:  07/26/2007  T:  07/26/2007  Job:  [id number]   cc:   Doylene Canning. [name], MD

## 2010-08-11 NOTE — H&P (Signed)
[name], [name] NO.:  [id number]   MEDICAL RECORD NO.:  [id number]          PATIENT TYPE:  INP   LOCATION:  2033                         FACILITY:  [facility]   PHYSICIAN:  Kari Baars, M.D.  DATE OF BIRTH:  Oct 20, 1924   DATE OF ADMISSION:  07/20/2007  DATE OF DISCHARGE:                              HISTORY & PHYSICAL   CHIEF COMPLAINT:  Abdominal pain.   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  The patient  reports that he was in his usual state of health until yesterday morning  when he had to strain for a bowel movement.  He was able to have a bowel  movement but developed some diffuse intermittent abdominal pain shortly  thereafter.  The abdominal pain persisted throughout the day throughout  his lower and mid abdomen, but he did manage to go to the gym.  He ate a  full supper last night and the pain intensified significantly.  At 1:00  a.m., his pain was so severe they came to the emergency department.  He  describes it as a periumbilical and bilateral lower quadrant diffuse  abdominal cramping pain.  He has had a some associated nausea with  minimal vomiting.  He has not noticed any flatus.  Being a retired  Development worker, community, he actually looks into his abdomen and did not hear any bowel  sounds.  It hurts to move in different positions.  In the emergency  department, evaluation included a CT scan, which revealed partial small-  bowel obstruction versus early complete obstruction in the left lower  quadrant.   REVIEW OF SYSTEMS:  The patient recently traveled to [place] where he had  significant  left shoulder pain.  He decided to increase his Celebrex use  at that time and due to increased risk of bleeding he stopped the  Coumadin.  Shortly thereafter, he had an episode of irregular heartbeat  and was seen in Urgent Care and referred to the emergency department for  atrial fibrillation.  He states that when he felt irregular beat, he  took an extra flecainide and took a low-dose Toprol.  When he presented  to the emergency department, his heart rate was in the 40s.  They  suggested that he may need a pacemaker.  He has seen Dr. [name] since  then who recommended continued observation and flecainide therapy.  He  is back in sinus rhythm with no other recurrences in the last month.  All other systems were reviewed with the patient and are negative except  in the HPI.   PAST MEDICAL HISTORY:  1. Loeffler's syndrome with recurrent pneumonitis, requiring      antibiotic and steroid therapy.  Most recent episode was December      2008.  2. Paroxysmal atrial fibrillation/SVT  on anticoagulation therapy.  3. Impaired fasting glucose.  4. Hypertension.  5. Hyperlipidemia.  6. Mild COPD.  7. Diverticulitis, status post partial colectomy (2003).  8. BPH.  9. Status post bilateral inguinal hernia repairs in 1942 and in 1986.      Status post umbilical hernia repair more recently.  10.Status post right total hip replacement (2004).  11.Status post lumbar laminectomy.  12.Status post rotator cuff repair.  13.Status post appendectomy.   CURRENT MEDICATIONS:  1. Coumadin  2. Flecainide 100 mg b.i.d.  3. Proscar 5 mg daily.  4. Advair 250/50 b.i.d.  5. Celebrex 200 mg daily.  6. Crestor 5 mg daily.  7. Singulair 10 mg daily.  8. Zyrtec 10 mg daily.  9. Omeprazole 20 mg b.i.d.  10.Flonase.   ALLERGIES:  SULFA.   SOCIAL HISTORY:  He is a retired Development worker, community and worked in Equities trader.  He spends about 3 months out of the year in [place], [place].  He is married with five  children and nine grandchildren.  He smoked two  packs per day for 12 years but quit in 1958.  He does drink alcohol  regularly and reports up to about 2 fifths per week.   FAMILY HISTORY:  Father died of lung cancer at 15.  Mother died of  stroke at 32.  He has four brothers, one of whom had leukemia, one with  coronary disease, and one with a heart failure.  His sister is healthy.   PHYSICAL EXAM:  VITAL SIGNS: Temperature 97.0, blood pressure 116/82,  pulse 79, respirations 16, and oxygen saturation 93% on room air.  GENERAL:  Pleasant gentleman in no acute distress.  He did have a wave  of nausea while I was examining him here.  HEENT: Pupils are equal, round, and reactive to light.  Extraocular  movements are intact.  No scleral icterus.  Oropharynx is moist.  NECK:  Supple without lymphadenopathy, JVD, or carotid bruits.  HEART: Regular rate and rhythm without murmurs, rubs, or gallops.  LUNGS: Clear to auscultation bilaterally.  ABDOMEN: Distended with no bowel sounds present.  It is tympanic with  diffuse mild tenderness.  No rebound or guarding.  EXTREMITIES: No  clubbing, cyanosis, or edema.   LABS:  CBC shows a white count of 10.1, hemoglobin 14.8, and platelets  174.  BMET significant for sodium 139, potassium 3.6, chloride 103,  bicarb 27, BUN 20, creatinine 1.6, and glucose 115.  Lipase 20.  INR  2.8.  Urinalysis negative except for ketones.   STUDIES:  CT of the abdomen and pelvis without IV contrast - partial  small-bowel obstruction versus early obstruction with transition point  in the left lower quadrant where surgical clips are present.  Ascending  colon diverticulosis.   ASSESSMENT/PLAN:  1. Small-bowel obstruction - this is likely due to adhesions from his      prior partial colectomy.  Less likely related to bowel ischemia in      the setting of his paroxysmal atrial fibrillation/supraventricular      tachycardia.  He will admit for conservative management  with n.p.o.      status, IV fluid hydration, IV narcotics, and antiemetics.  He may      eventually need NG decompression, although he is reluctant to do      this at this point.  I will consult general surgery to follow with      [place] in the event that he may need surgical intervention.  We  will      monitor with serial KUBs.  Obtain a lactate to rule out ischemia.  2. Paroxysmal atrial fibrillation/supraventricular tachycardia - we      will hold his Coumadin for possible need for surgery.  We will      start a heparin drip once his INR is less than 2.0.  We will try to      continue his flecainide p.o. if he is able to keep pills down.  He      will need an IV alternative if he develops nausea and vomiting or      he is on strict n.p.o. status.  3. Acute renal failure - likely prerenal azotemia with slight increase      in BUN and creatinine.  We will monitor with hydration.  Of note,      he did not receive IV contrast with his CT.  4. History of Loeffler's syndrome with recurrent pneumonitis - we will      continue his Advair and Xopenex.  Hold Singulair for now.  5. Fluids, electrolytes, and nutrition.  Normal saline at 125 mL/hour.      N.p.o. status.  We may need to start total parenteral nutrition if      prolonged n.p.o. status.  6. Disposition - anticipate discharge back to home once acute issues      resolved.      Kari Baars, M.D.  Electronically Signed     WS/MEDQ  D:  07/20/2007  T:  07/20/2007  Job:  [id number]

## 2010-08-14 NOTE — Discharge Summary (Signed)
[facility]  Patient:    [name], [name]                       MRN: [id number] Proc. Date: 10/24/00 Adm. Date:  [id number] Disc. Date: [id number] Attending:  Katha Cabal CC:         Titus Dubin. Alwyn Ren, M.D. [facility]  Malcolm T. Pleas Koch., M.D. [facility]   Discharge Summary  PREOPERATIVE DIAGNOSIS:  Longstanding diverticular disease.  POSTOPERATIVE DIAGNOSIS:  Extensive left colon and sigmoid diverticulitis with a peridiverticular abscess.  PROCEDURE:  Procedure was done July 29 and was a laparoscopic immobilization of the splenic flexure with left hemicolectomy and transverse to distal sigmoid anastomosis.  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  He was taken to the operating room at which time much more extensive diverticulitis was found than anticipated. Fortunately, stents had been placed by Dr. Amil Amen preoperatively.  We were able to go ahead and resect a long segment of his descending colon and sigmoid colon.  A handsewn anastomosis was performed and, postoperatively, he did well.  He began passing gas and had a bowel movement, and a drain that I placed was removed.  He had been kept on antibiotics perioperatively because of the extensive diverticulitis and the active abscess formation.  He got a long well and was ready for discharge on October 31, 2000.  He was given Flomax to take because he has had some problems with urinary hesitancy and requiring catheterization and Vicodin.  He was instructed to return to the office in two to three weeks.  He knows to maintain a careful diet that he has been on preoperatively since he does have scattered diverticula on the right side of his colon that is remaining. DD:  10/31/00 TD:  11/01/00 Job: [id number]

## 2010-08-14 NOTE — Assessment & Plan Note (Signed)
[facility]                         ELECTROPHYSIOLOGY OFFICE NOTE   [name], [name]                       MRN:          [id number]  DATE:07/12/2006                            DOB:          01/27/1925    [name] is referred today by Dr.  Dwana Melena for evaluation of  recurrent SVT.  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  He also has a history of  hypertension, gastroesophageal reflux disease and a remote history of  eosinophilic pneumonia.  The patient knows that he has had palpitations  off and on for several years and was seen by Dr. Antoine Poche back in 2004.  At that time his episodes of palpitations were fairly infrequent and  typically could be broken, by his report, with vagal maneuvers.  He  opted for a fairly conservative management strategy and actually had not  required much in the way of medical therapy but over the last several  weeks has had increasingly frequent episodes of palpitations.  The  patient states that yesterday he went out of rhythm and has been out of  rhythm ever since.  He did try taking a 18-year-old tablet of metoprolol  but has had no change in his symptoms.   ADDITIONAL PAST MEDICAL HISTORY:  Notable for:  1. Diverticulosis.  2. Colonic polyps.  3. A history of allergic bronchopulmonary Aspergillosis.   PAST SURGICAL HISTORY:  Notable for:  1. Inguinal hernia repair.  2. He has had cervical disk surgery.  3. Appendectomy in the past.   HE HAS A HISTORY OF ALLERGY TO SULFA DRUGS.   MEDICATIONS:  Advair, Flonase, finasteride, HCTZ, Prilosec, Singulair,  ibuprofen and Ambien p.r.n.   FAMILY HISTORY:  Noncontributory for premature coronary disease.   SOCIAL HISTORY:  1. The patient is a retired Development worker, community.  2. He has a history of tobacco use, stopping smoking over 30 years      ago.   REVIEW OF SYSTEMS:  Notable for arthritic complaints in  the past.  Otherwise, has palpitations, minimal shortness of breath, minimal chest  discomfort with the palpitations, minimal fatigue.  Otherwise, all  systems reviewed and found to be negative.   PHYSICAL EXAM:  He is a pleasant, well-appearing man in no acute  distress.  The blood pressure was 103/70, the pulse was 138 and regular,  the respirations were 18 and the weight was 182 pounds.  HEENT EXAM:  Normocephalic and atraumatic.  Pupils equal and round.  The  oropharynx is moist.  Sclera were anicteric.  NECK:  No jugular venous distention, no thyromegaly.  I did not  appreciate any canon A waves and his neck veins were not fully  distended.  LUNGS:  Clear bilaterally to auscultation.  No wheezes, rales or  rhonchi.  No increased worker breathing.  CARDIOVASCULAR EXAM:  A regular tachycardia with normal S1 and S2.  The  PMI was not enlarged nor laterally displaced.  ABDOMINAL EXAM:  Soft, nontender, nondistended.  There was no  organomegaly.  Bowel sounds were present.  There was no rebound or  guarding.  EXTREMITIES:  No cyanosis, clubbing or edema.  NEUROLOGIC EXAM:  Alert and oriented x3 with cranial nerves intact.  Strength was 5/5 and symmetric.   The EKG demonstrates narrow QRS tachycardia at a rate of 138 b.p.m.  There is clearly 1 P-wave, although he could he could perhaps have 2.  I  cannot distinguish between atrial flutter vs. Atrial tachycardia vs.  unusual AVNRT.  Vagal maneuvers were subsequently carried out which  demonstrated no termination of the tachycardia and no change in AV  conduction.  We also tried carotid massage with the same result.   IMPRESSION:  1. Recurrent and symptomatic supraventricular tachycardia.  2. History of hypertension.  3. Allergic rhinitis and history of eosinophilic pneumonia.   DISCUSSION:  I discussed the treatment options with [name] in detail.  One option would be to have him admitted to the hospital and undergo  catheter  ablation.  Second option would be giving him a prescription for  some beta-blockers as he is not in any distress from his SVT, and if he  terminates great, if not we will have him come in for catheter ablation.  The final option would be take him to the emergency room and give him  intravenous adenosine to try to terminate his arrhythmia.  The pros and  cons of each of the above options have been described to the patient.  He would like to proceed with beta-blocker therapy and if his SVT has  not terminated come in for catheter ablation.     Doylene Canning. [name], MD  Electronically Signed    GWT/MedQ  DD: 07/12/2006  DT: 07/12/2006  Job #: [id number]   cc:   Rollene Rotunda, MD, Elmer Sow. Alwyn Ren, MD,FACP,FCCP

## 2010-08-14 NOTE — Op Note (Signed)
[facility]  Patient:    SIDDH, VANDEVENTER DR.                 MRN: [id number] Proc. Date: 10/24/00 Attending:  Thornton Park. Daphine Deutscher, M.D. CC:         Venita Lick. Pleas Koch., M.D. [facility]  Jamison Neighbor, M.D.  Titus Dubin. Alwyn Ren, M.D. [facility]   Operative Report  PREOPERATIVE DIAGNOSIS:  Chronic diverticulitis.  POSTOPERATIVE DIAGNOSIS:  Severe chronic diverticulitis involving the descending and sigmoid colon with scattered diverticulum throughout the remaining colon.  OPERATION:  Laparoscopic-assisted mobilization of the splenic flexure and left colon with open left colectomy and transverse-to-distal sigmoid anastomosis.  SURGEON:  Thornton Park. Daphine Deutscher, M.D.  ASSISTANT:  Adolph Pollack, M.D.  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  Dr. Logan Bores placed bilateral ureteral stents.  The patients abdomen and perineum were then prepped with Betadine and draped sterilely.  He was in stirrups so that he could be done from below if necessary.  First, I made a longitudinal incision just below the umbilicus.  He had had a previous tummy tuck, and this sort of affected somewhat his anatomy, but I went in through the midline and put the Hasson in and then placed three 5 mm trocars in each of the right lower quadrant, upper midline, and in the left lower quadrant.  Using the harmonic scalpel, I went ahead and set about mobilizing the left colon.  He did have a lot of pretty intense inflammatory adhesions along the left colon that was the most striking initially.  I carried this up to the spleen, identified that, and then took it down off the spleen.  We then found the transverse colon, and I took the omentum off the transverse colon and mobilized the transverse colon up to the splenic flexure and freed this.  I then was able to bring this down toward the umbilicus.  At that point, we  then placed the patient head down and began mobilizing the distal sigmoid.  The sigmoid was basically frozen in the pelvis.  There were some loops of small bowel stuck down the right side which I thought could probably explain some of the pain that he had had even on the right side.  The small bowel was freed up, and then we went down, and I felt at this point that I could do no more with the laparoscopic instruments in trying to dig in this out of his pelvis.  I went ahead and then made the lower midline incision, inserted the hands-free sleeve, and then began the sharp dissection.  I found this to be very much stuck and very difficult even with him open using both tactile as well as visual cues in doing this dissection.  I tried to stay on the bowel.  There were some tics that had ruptured, and he did have some kind of stool that we controlled leakage from.  His bowel prep was moderately good.  Contamination was controlled with suction, and I had to oversew some of these areas.  There were at least three real hard areas that I subsequently identified in his sigmoid and left colon.  I could not feel anything anymore in the transverse or the right colon that was hard.  The incision had to be extended down towards to the pubis.  With that, I went ahead and was able to get down  into a soft area of the distal sigmoid where the taenia became one.  I then went proximally and found a soft area and tried to get above most all the diverticular disease, and I found myself in the transverse colon.  I divided the bowel there and then went right along the mesentery, oversewing any bleeders with figure-of-eight sutures of 2-0 silk.  I carried this down beyond that, placed a clamp, and transected it.  Stay sutures had been applied.  I then put my finger down in to the bowel to make sure there was nothing stenotic, and it appeared to be really free of significant diverticular disease down below.  As  stated earlier, this was down where the taenia seemed to be confluent.  The bowel that was resected was probably almost 2 feet long and was sent to pathology where no gross cancers were noted.  I had to extend my incision a little bit higher and pulled the transverse colon down distally and did a hand sewn anastomosis along the taenia in a ______ fashion.  This was a two-layer anastomosis using 2-0 silk posteriorly, an inner layer of running locking of 4-0 PDS turned into a canal suture anteriorly, and an anterior row of silk was placed to complete the anastomosis.  The bowel lay in a nice position.  There was a long mesentery down.  I changed gloves and inspected the small intestine and looked in that area, and the anastomosis felt patent, and there was no active bleeding seen.  I irrigated with copious amounts of saline, and this was withdrawn.  We changed our gloves and used different instruments.  Sponge and needle counts were reported as correct.  The peritoneum was closed with running 2-0 Vicryl.  The wound was then closed with running #1 Prolene from above and below.  Double antibiotic solution was used on the subcutaneous tissue.  I also used some Betadine and when I was doing the anastomoses in both ends of the bowel that were being anastomosed.  The skin was then approximated with vertical mattress sutures of 3-0 nylon.  I also had placed a drain through the right lower quadrant trocar port.  This was placed down in the pelvis.  The skin was closed with a stapler as were the other trocar holes.  The patient seemed to tolerate this procedure well and was taken to the recovery room in satisfactory condition.  Stents were removed in the OR, and the Foley was left in place.  The patient will go to stepdown for postoperative period. DD:  10/24/00 TD:  10/24/00 Job: [id number]

## 2010-08-14 NOTE — Op Note (Signed)
[facility]  Patient:    [name], [name]                       MRN: [id number] Proc. Date: 10/24/00 Adm. Date:  [id number] Disc. Date: [id number] Attending:  Katha Cabal CC:         Thornton Park. Daphine Deutscher, M.D.   Operative Report  PREOPERATIVE DIAGNOSIS:  Diverticulitis.  PROCEDURE:  Cystoscopy, retrograde and stent placement in preparation for a left hemicolectomy.  SURGEON:  Jamison Neighbor, M.D.  ANESTHESIA:  General.  COMPLICATIONS:  None.  DRAIN:  6 French catheter plus a 6 French Foley catheter.  BRIEF HISTORY:  This 77 year old male is scheduled to undergo a left hemicolectomy because of extensive diverticulitis.  The patient is felt to be at risk for ureteral injury, and Dr. Wenda Low has requested that a stent be placed.  The patient is to undergo stent placement prior to the procedure.  DESCRIPTION OF PROCEDURE:  After the successful induction of general anesthesia, the patient was placed in the dorsal lithotomy position and prepped with Betadine and draped in the usual sterile fashion.  Cystoscopy was performed.  The bladder was carefully inspected.  It was free of any tumor or stones.  Both ureteral orifices were normal in configuration and location.  A ureteral catheter was passed on the left-hand side, and its position was confirmed by fluoroscopy.  This was placed to straight drainage with a Foley catheter alongside, and the two were hooked together with silk sutures.  A Goldberg device was then used to collect the urine in the ureteral catheter as well as in the Foley.  The patient tolerated the procedure well and was then placed in the appropriate position for his planned surgery. DD:  11/08/00 TD:  11/08/00 Job: [id number]

## 2010-08-14 NOTE — H&P (Signed)
[facility]  Patient:    [name], [name] Visit Number: [id number] MRN: [id number]          Service Type: SUR Location: 3W [id number] 01 Attending Physician:  Luretha Murphy Brunson Adm. Date:  10/24/2000 Disc. Date: 10/31/2000                           History and Physical  CHIEF COMPLAINT:  Chronic diverticulitis.  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  Most recently he has had flare-ups, for which he has taken Cipro and seems to die back down and pain seems to go away.  On barium enema he does have extensive scattered diverticular disease but mostly concentrated in the sigmoid area and descending colon.  ALLERGIES:  Patient has no known allergies except to SULFA, which produces hives.  MEDICATIONS:  Proscar 5 mg one q.d., Zyrtec 10 mg one q.d., Ambien 10 mg one q.d., Pulmicort inhaler 200 mcg eight times a day, albuterol inhaler p.r.n., Flonase nasal spray q.d., hydrochlorothiazide 12.5 mg q.d., multivitamins, calcium, and Naprosyn.  SOCIAL HISTORY:  The patient is married.  REVIEW OF SYSTEMS:  Noncontributory.  PHYSICAL EXAMINATION:  VITAL SIGNS:  Physical exam reveals him to be 194 pounds, 70-1/2 inches, temperature 97.8, pulse 72, respirations 16, blood pressure 120/78.  HEENT:  Head normocephalic.  Eyes:  Sclerae are nonicteric.  Pupils are equal, round and reactive to light.  Nose and throat exam unremarkable.  NECK:  Supple.  CHEST:  Clear.  CARDIAC:  Sinus rhythm without murmurs or gallops.  ABDOMEN:  There is some soreness in the lower abdomen, generally nontender.  EXTREMITIES:  Full range of motion.  IMPRESSION:  Recurrent bouts of diverticulitis.  PLAN:  Laparoscopically assisted colectomy. Attending Physician:  Katha Cabal DD:  11/20/00 TD:  11/21/00 Job: [id number]

## 2010-08-14 NOTE — Op Note (Signed)
NAME:  [name]                          ACCOUNT NO.:  [id number]   MEDICAL RECORD NO.:  [id number]                   PATIENT TYPE:  INP   LOCATION:  X003                                 FACILITY:  [facility]   PHYSICIAN:  Ollen Gross, M.D.                 DATE OF BIRTH:  06-28-1924   DATE OF PROCEDURE:  12/26/2002  DATE OF DISCHARGE:                                 OPERATIVE REPORT   PREOPERATIVE DIAGNOSIS:  Osteoarthritis right hip.   POSTOPERATIVE DIAGNOSIS:  Osteoarthritis right hip.   PROCEDURE:  Right total hip arthroplasty.   SURGEON:  Ollen Gross, M.D.   ASSISTANT:  Alexzandrew L. Julien Girt, P.A.   ANESTHESIA:  General.   ESTIMATED BLOOD LOSS:  600   DRAINS:  Hemovac x1.   COMPLICATIONS:  None.   CONDITION:  Stable to recovery.   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  He presents now for right total hip arthroplasty.   DESCRIPTION OF PROCEDURE:  After successful administration of general  anesthetic, the patient was placed in the left lateral decubitus position  with the right side up and held with a hip positioner. The right lower  extremity was isolated from his perineum with plastic drapes and prepped and  draped in the usual sterile fashion.  A mini posterolateral incision was  made with a 10 blade through the subcutaneous tissue to the level of the  fascia lata which was incised in line with the skin incision. The sciatic  nerve was palpated and protected and short rotators isolated off the femur.  Capsulectomy was performed, hip dislocated and center of the femoral head  marked. Trial prosthesis is placed such that the center of the trial head  corresponds to the center of his native femoral head. Osteotomy line is  marked on the femoral neck and osteotomy made with an oscillating saw. The  femoral head is removed and then the femur  retracted anteriorly. Acetabular  exposure was obtained.   Retractors are placed to gain excellent acetabular exposure and then we  removed the labrum and any soft tissue within the acetabulum. He had a lot  of osteophytes posteriorly which are removed. Reaming is then initiated with  a 45 coursing in increments of 2 to a 55 and then a 56 mm pinnacle  acetabular shell is placed in anatomic position and transfixed with two dome  screws. I did place some of his reamings down centrally to build up a little  bit of medial wall since he had such a significant amount of offset on his  native femoral neck. I was real pleased with the position of the cup as it  was in anatomic position and after we transected it with the dome screws, we  placed a 32 mm neutral trial liner.  The femur was repaired first with a canal finder and irrigation. Axial  reaming was performed to 15.5 mm, proximal reaming to a 20D and the sleeve  machined to a large. The 20D large sleeve is placed with a 20 x 15 stem. We  first used a 36 plus 8 neck with 32 plus 0 head. This did not accurately  recreate his offset so we went to a 36 plus 12 neck with a 32 plus 0 head.  This did recreate his offset and he had excellent soft tissue tension. With  the right leg on top of the left, his leg lengths were equal. I was able to  flex the hip to 90, rotate in 70, flex 70, rotate in 90 and fully externally  rotate and fully extend. All trials were then removed and the permanent apex  hole eliminator and permanent 32 mm neutral marathon liner placed into the  acetabular shell. A permanent 20D large sleeve, 20 x 15 stem, 36 plus 12  neck and 32 plus 0 head are placed. The hip is reduced with the same  stability parameters and the wound copiously irrigated with antibiotic  solution. Short rotators were reattached to the femur through drill holes  and fascia lata closed over a Hemovac drain with interrupted #1 Vicryl,  subcu closed with  #1 and 2-0 Vicryl, subcuticular closed with running 4-0  Monocryl. The incision was cleaned and dried and Steri-Strips and a bulky  sterile dressing applied. The Hemovac is hooked to suction. He was placed  into a knee immobilizer, awakened and transported to recovery in stable  condition.                                               Ollen Gross, M.D.    FA/MEDQ  D:  12/26/2002  T:  12/26/2002  Job:  [id number]

## 2010-08-14 NOTE — H&P (Signed)
NAME:  [name], [name]                          ACCOUNT NO.:  [id number]   MEDICAL RECORD NO.:  [id number]                   PATIENT TYPE:  INP   LOCATION:  0471                                 FACILITY:  [facility]   PHYSICIAN:  Ollen Gross, M.D.                 DATE OF BIRTH:  08-07-1924   DATE OF ADMISSION:  12/26/2002  DATE OF DISCHARGE:                                HISTORY & PHYSICAL   DATE OF OFFICE VISIT HISTORY AND PHYSICAL:  12/21/2002   CHIEF COMPLAINT:  Right hip pain.   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 He has had a several year history with progressively  worsening right hip pain especially getting worse in the past six months. It  is mainly in the right groin traveling down through the thigh. He has seen  Dr. Ophelia Charter in the past and came in for a second opinion and further treatment  options. He said that he was recently in [place] and was swimming everyday  which was making a big difference but he is at a stage now where he is able  to do most activities but with difficulty and seems to be doing less and  less. He comes in and is seen in the office and found to have significant  joint space narrowing of the right hip on films that were taken back in  October of 2003. Updated films show bone on bone changes with fairly large  inferior osteophytes in the right hip. It is felt Dr. Glory Buff has reached a  point where he could benefit from total hip replacement. The risks and  benefits of the procedure have been discussed with him by Dr. Lequita Halt and he  has elected to proceed with surgery.   ALLERGIES:  SULFA.   CURRENT MEDICATIONS:  1. Proscar 5 mg.  2. Advair 100/50.  3. Zyrtec 10 mg.  4. Flonase daily.  5. Ambien 10 mg.  6. Celebrex 200 mg, discontinue prior to surgery.   PAST MEDICAL HISTORY:  1. Asthma.  2. History of intermittent supraventricular tachycardia.  3. History of diverticulitis although  he has had some abdominal surgery in     the past. He denies any abscess or acute abdomen with the diverticulitis     history.  4. He has benign prostatic hypertrophy.  5. Cervical degenerative disk disease.   PAST SURGICAL HISTORY:  1. Appendectomy.  2. Umbilical hernia.  3. Inguinal hernia x2.  4. Partial colectomy.  5. Lumbar diskectomy.  6. Rotator cuff repair.   SOCIAL HISTORY:  He is married, he is a retired Development worker, community, nonsmoker.  Occasional intake of alcohol on the weekend, has five children. His wife  will be assisting with his care after surgery. He currently lives in [place].   FAMILY HISTORY:  Father with a history of lung cancer,  brother with a  history of leukemia.   REVIEW OF SYMPTOMS:  GENERAL:  No fever, chills or night sweats. NEUROLOGIC:  No seizure, syncope, paralysis. RESPIRATORY:  He has had some intermittent  cough in the past with a history of asthma, no shortness of breath,  productive cough or hemoptysis. CARDIOVASCULAR:  History of intermittent SVT  in the past. No chest pain, angina, orthopnea. GI:  No nausea, vomiting,  diarrhea or constipation. No blood or mucus in the stools. GU:  He does have  some occasional urgency and nocturia associated with his prostate disease.  No dysuria or hematuria. MUSCULOSKELETAL:  Pertinent to the hip found in the  history of present illness.   PHYSICAL EXAMINATION:  VITAL SIGNS:  Pulse 60, respirations 12, blood  pressure 142/84.  GENERAL:  A 77 year old white male, well-nourished, well-developed, no acute  distress, alert, oriented and cooperative. Very pleasant at the time of  exam, excellent historian.  HEENT:  Normocephalic, atraumatic. Pupils round and reactive. EOM's intact.  NECK:  Supple, no carotid bruits are appreciated.  CHEST:  Clear to auscultation. Anterior and posterior chest walls no  rhonchi, rales or wheezing.  HEART:  Regular rate and rhythm. No murmurs, no rubs, thrills, or  palpitations.  S1, S2 noted.  ABDOMEN:  Soft, nontender, bowel sounds present.  RECTAL/BREASTS/GENITALIA:  Not done not pertinent to present illness.  EXTREMITIES:  Significant to that to the right hip. He has hip flexion about  90 degrees, there is no internal rotation, only about 5 degrees of external  rotation. He can abduct the leg about 20 degrees. He does ambulate with an  antalgic gait.   IMPRESSION:  1. Osteoarthritis right hip.  2. Asthma.  3. Diverticulitis.  4. Prostate disease.  5. Cervical degenerative disk disease.   PLAN:  The patient will be admitted to [facility] to undergo a  right total hip arthroplasty. The surgery will be performed by Ollen Gross, M.D. The patient's medical doctor is Titus Dubin. Alwyn Ren, M.D.  Dr.  Alwyn Ren will be notified of the room number and will be consulted if needed  for any medical assistance with this patient throughout the hospital course.     Alexzandrew L. Julien Girt, P.A.              Ollen Gross, M.D.    ALP/MEDQ  D:  12/26/2002  T:  12/26/2002  Job:  [id number]   cc:   Titus Dubin. Alwyn Ren, M.D. [facility]

## 2010-08-14 NOTE — Discharge Summary (Signed)
NAME:  [name], [name]                          ACCOUNT NO.:  [id number]   MEDICAL RECORD NO.:  [id number]                   PATIENT TYPE:  INP   LOCATION:  0471                                 [facility]ACILITY:  [facility]   PHYSICIAN:  [name], M.D.                 DATE OF BIRTH:  September 10, 1924   DATE OF ADMISSION:  12/26/2002  DATE OF DISCHARGE:  12/30/2002                                 DISCHARGE SUMMARY   ADMITTING DIAGNOSES:  1. Osteoarthritis right hip.  2. Asthma.  3. Diverticulitis.  4. Prostate disease.  5. Cervical degenerative disk disease.   DISCHARGE DIAGNOSES:  1. Osteoarthritis right hip status post right total hip arthroplasty.  2. Asthma.  3. Diverticulitis.  4. Prostate disease.  5. Cervical degenerative disk disease.   PROCEDURE:  Date of surgery:  December 26, 2002.  Right total hip  arthroplasty.  Surgeon [name], M.D.  Assistant [name] L.  Julien [name], P.A.  Anesthesia general.  Blood loss 600 mL.  Hemovac drain x1.   CONSULTS:  None.   BRIEF HISTORY:  [name] is a 77 year old male with severe end-stage  arthritis of right hip.  Pain has been refractory to nonoperative  management.  It is felt he would benefit from undergoing a hip replacement.  Risks and benefits discussed.  The patient is subsequently admitted to the  hospital.   LABORATORY DATA:  CBC on admission:  Hemoglobin 14.6, hematocrit 42.6, white  cell count 6.2, red cell count 4.57.  Differential within normal limits.  Postoperative H&H 11.8 and 34.6.  Last noted H&H 10.8 and 31.3.  PT/PTT on  admission 13 and 32, respectively with an INR of 1.0.  Serial pro times  followed.  Last noted PT/INR 16.8 and 1.6.  Chemistry panel on admission all  within normal limits.  Serial BMETs are followed.  Glucose went up from 92  to 136 back down to 128.  Calcium went from 9.6 to 8.3.  He did have a lipid  profile study ordered.  His triglycerides were 74.  Lipid profile showed a  cholesterol of  197.  His HDL was 56.  His total cholesterol/HDL ratio was  3.5.  His VLDL was 15.  His LDL was high at 126.  Urinalysis on admission  negative.  Blood group type A+.  Preoperative chest x-ray:  Mild COPD.  No  active disease.  Dated December 26, 2002.  Right hip films dated December 26, 2002:  Severe degenerative arthritic changes right hip.  Postoperative  pelvis and hip films on December 26, 2002:  Good AP view of the  pelvis/proximal femur.  Right total hip prosthesis in good position.   HOSPITAL COURSE:  The patient was [facility] to [facility], taken to  OR.  Underwent above stated procedure without complication.  The patient  tolerated procedure well.  Later returned to recovery room and to orthopedic  floor for continued postoperative care.  Vital signs were followed.  The  patient was given 24 hours of postoperative IV antibiotics in the form of  Ancef.  Placed on Coumadin.  PT and OT consulted postoperative.  He was  placed to touchdown weightbearing.  Home medications were restarted.  Hemovac drain placed at time of surgery was pulled on postoperative day one  without difficulty.  Discharge planning was consulted postoperatively to  assist with post hospital care.  By day two he had been weaned over to p.o.  medications, got rid of the Foley and his PCA.  Was running a little low  grade temperature.  Encouraged incentive spirometer and antipyretics.  His  laboratories were followed very closely.  His hemoglobin remained stable.  From a standpoint from his therapy he did very well and was up ambulating  approximately 60 feet by postoperative day two and then greater than 150  feet by postoperative day three.  Dressing change initiated on postoperative  day two.  Incision healing well.  By day four he was up and moving around  with therapy quite well.  Arrangements had been made for post hospital care.  He was discharged home.   DISCHARGE PLAN:  The patient discharged  home on December 30, 2002.   DISCHARGE DIAGNOSES:  Please see above.   DISCHARGE MEDICATIONS:  1. Percocet.  2. Robaxin.  3. Coumadin.   DIET:  As tolerated.   ACTIVITY:  Touchdown weightbearing.  Genevieve Norlander for home health PT and home  health nursing.  Total hip protocol.   FOLLOWUP:  Two weeks from surgery.   DISPOSITION:  Home with family.   CONDITION ON DISCHARGE:  Improved.     [name], P.A.              [name], M.D.    ALP/MEDQ  D:  02/06/2003  T:  02/06/2003  Job:  [id number]   cc:   Titus Dubin. Alwyn Ren, M.D. [facility]

## 2010-08-27 ENCOUNTER — Encounter: Payer: Self-pay | Admitting: *Deleted

## 2010-09-01 ENCOUNTER — Ambulatory Visit (INDEPENDENT_AMBULATORY_CARE_PROVIDER_SITE_OTHER): Payer: Medicare Other | Admitting: *Deleted

## 2010-09-01 ENCOUNTER — Encounter: Payer: Self-pay | Admitting: Internal Medicine

## 2010-09-01 ENCOUNTER — Ambulatory Visit (INDEPENDENT_AMBULATORY_CARE_PROVIDER_SITE_OTHER): Payer: Medicare Other | Admitting: Internal Medicine

## 2010-09-01 DIAGNOSIS — I4891 Unspecified atrial fibrillation: Secondary | ICD-10-CM

## 2010-09-01 DIAGNOSIS — I10 Essential (primary) hypertension: Secondary | ICD-10-CM

## 2010-09-01 NOTE — Progress Notes (Signed)
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 He is an 77 year old man with a history of hypertension, paroxysmal atrial fibrillation, and paroxysmal atrial tachycardia. Over the years, the patient's conduction system has gradually declined. That said he remains asymptomatic and is not particularly bradycardic. He notes one episode of tachycardia palpitations which he thinks was not atrial fibrillation but rather atrial tachycardia. It lasted only a few minutes and stops with a vagal maneuver. He denies noncompliance with his medications. He denies chest pain or shortness of breath. Allergies  Allergen Reactions  . Sulfonamide Derivatives      Current Outpatient Prescriptions  Medication Sig Dispense Refill  . B Complex Vitamins (VITAMIN-B COMPLEX PO) 1 tab po qd       . Calcium 600-200 MG-UNIT per tablet Take 1 tablet by mouth daily.        . celecoxib (CELEBREX) 200 MG capsule Take 200 mg by mouth daily.        . Cetirizine HCl (ZYRTEC ALLERGY PO) Take 10 mg by mouth daily.        . finasteride (PROSCAR) 5 MG tablet Take 5 mg by mouth daily.        . flecainide (TAMBOCOR) 150 MG tablet Take 150 mg by mouth 2 (two) times daily.        . fluticasone (FLONASE) 50 MCG/ACT nasal spray Place 2 sprays into the nose daily.        . Fluticasone-Salmeterol (ADVAIR DISKUS) 250-50 MCG/DOSE AEPB Inhale 1 puff into the lungs 2 (two) times daily.        . montelukast (SINGULAIR) 10 MG tablet Take 10 mg by mouth at bedtime.        . NON FORMULARY allergy shots       . omeprazole (PRILOSEC) 20 MG capsule Take 20 mg by mouth daily.        . rosuvastatin (CRESTOR) 5 MG tablet Take 5 mg by mouth daily.        . temazepam (RESTORIL) 15 MG capsule Take 15 mg by mouth daily.        Marland Kitchen warfarin (COUMADIN) 5 MG tablet Take by mouth as directed.        . zolpidem (AMBIEN) 10 MG tablet Take 10 mg by mouth at bedtime as needed.        Marland Kitchen DISCONTD: Multiple Vitamin (MULTIVITAMIN) capsule Take 1 capsule by mouth daily.            Past Medical History  Diagnosis Date  . Bradycardia   . Palpitations   . Asthma   . Hemorrhoids   . Hx of adenomatous colonic polyps   . Atrial fibrillation   . Hypertension   . Pulmonary aspergillosis     ROS:   All systems reviewed and negative except as noted in the HPI.   Past Surgical History  Procedure Date  . Colon resection   . Total hip arthroplasty   . Appendectomy   . Laminectomy   . Cystoscopy      No family history on file.   History   Social History  . Marital Status: Married    Spouse Name: N/A    Number of Children: N/A  . Years of Education: N/A   Occupational History  . retired physician    Social History Main Topics  . Smoking status: Former Games developer  . Smokeless tobacco: Not on file  . Alcohol Use: 0.0 oz/week  . Drug Use: No  . Sexually Active: Not on file  Other Topics Concern  . Not on file   Social History Narrative  . No narrative on file     BP 130/78  Pulse 59  Resp 14  Ht 5\' 10"  (1.778 m)  Wt 170 lb (77.111 kg)  BMI 24.39 kg/m2  Physical Exam:  Well appearing NAD HEENT: Unremarkable Neck:  No JVD, no thyromegally Lymphatics:  No adenopathy Back:  No CVA tenderness Lungs:  Clear HEART:  Regular rate rhythm, no murmurs, no rubs, no clicks Abd:  Flat, positive bowel sounds, no organomegally, no rebound, no guarding Ext:  2 plus pulses, no edema, no cyanosis, no clubbing Skin:  No rashes no nodules Neuro:  CN II through XII intact, motor grossly intact  EKG Normal sinus rhythm with first-degree AV block. Poor R-wave progression. Incomplete right bundle branch block.  Assess/Plan:

## 2010-09-01 NOTE — Patient Instructions (Signed)
Your physician recommends that you schedule a follow-up appointment in: 6 MONTHS WITH DR [name] Your physician recommends that you continue on your current medications as directed. Please refer to the Current Medication list given to you today.

## 2010-09-01 NOTE — Assessment & Plan Note (Signed)
His blood pressure remains well controlled. He was continue his current medications and maintain a low-sodium diet.

## 2010-09-01 NOTE — Assessment & Plan Note (Signed)
His arrhythmias remained fairly well-controlled. He will continue flecainide. We'll see him back in several months.

## 2010-09-03 NOTE — Progress Notes (Signed)
Addended by: Keitha Butte on: 09/03/2010 01:55 PM   Modules accepted: Orders

## 2010-09-15 ENCOUNTER — Telehealth: Payer: Self-pay | Admitting: Internal Medicine

## 2010-09-15 NOTE — Telephone Encounter (Signed)
On 09/27/2010 pt is leaving on a trip to [place] and will be going through a number of remote area's there will be little or no medical care in some area's and as you are aware he is on coumadin and he doesn't think he will be able to obtain a decent diet of green veggies there fore it seems reasonable to d/c coumadin and start asa for that week. He has not had a-fib in well over a year, he would like Dr. Lubertha Basque opinion on when he should start and stop asa and coumadin. Please call him when you get a chance. He will be at home for the rest of the day and will be home after 2pm on tomorrow

## 2010-09-16 NOTE — Telephone Encounter (Signed)
Discussed with Dr [name] he says to stay on the Coumadin and make sure patient has a CVRR clinic appointment when he returns  Pt advised

## 2010-09-24 ENCOUNTER — Ambulatory Visit (INDEPENDENT_AMBULATORY_CARE_PROVIDER_SITE_OTHER): Payer: Medicare Other | Admitting: *Deleted

## 2010-09-24 DIAGNOSIS — I4891 Unspecified atrial fibrillation: Secondary | ICD-10-CM

## 2010-09-29 NOTE — Progress Notes (Signed)
Addended by: Judithe Modest D on: 09/29/2010 02:36 PM   Modules accepted: Orders

## 2010-10-05 ENCOUNTER — Ambulatory Visit (INDEPENDENT_AMBULATORY_CARE_PROVIDER_SITE_OTHER): Payer: Medicare Other | Admitting: *Deleted

## 2010-10-05 ENCOUNTER — Encounter: Payer: Medicare Other | Admitting: *Deleted

## 2010-10-05 DIAGNOSIS — I4891 Unspecified atrial fibrillation: Secondary | ICD-10-CM

## 2010-10-05 LAB — POCT INR: INR: 4.4

## 2010-10-06 ENCOUNTER — Encounter: Payer: Medicare Other | Admitting: *Deleted

## 2010-10-19 ENCOUNTER — Ambulatory Visit (INDEPENDENT_AMBULATORY_CARE_PROVIDER_SITE_OTHER): Payer: Medicare Other | Admitting: *Deleted

## 2010-10-19 DIAGNOSIS — I4891 Unspecified atrial fibrillation: Secondary | ICD-10-CM

## 2010-11-09 ENCOUNTER — Ambulatory Visit (INDEPENDENT_AMBULATORY_CARE_PROVIDER_SITE_OTHER): Payer: Medicare Other | Admitting: *Deleted

## 2010-11-09 DIAGNOSIS — I4891 Unspecified atrial fibrillation: Secondary | ICD-10-CM

## 2010-11-09 LAB — POCT INR: INR: 2.3

## 2010-11-27 ENCOUNTER — Other Ambulatory Visit: Payer: Self-pay | Admitting: Family Medicine

## 2010-11-27 ENCOUNTER — Other Ambulatory Visit: Payer: Self-pay | Admitting: Internal Medicine

## 2010-11-27 DIAGNOSIS — G609 Hereditary and idiopathic neuropathy, unspecified: Secondary | ICD-10-CM

## 2010-12-07 ENCOUNTER — Ambulatory Visit
Admission: RE | Admit: 2010-12-07 | Discharge: 2010-12-07 | Disposition: A | Discharge status: Home / Self Care | Payer: Medicare Other | Source: Ambulatory Visit | Attending: Internal Medicine | Admitting: Internal Medicine

## 2010-12-07 DIAGNOSIS — G609 Hereditary and idiopathic neuropathy, unspecified: Secondary | ICD-10-CM

## 2010-12-07 MED ORDER — GADOBENATE DIMEGLUMINE 529 MG/ML IV SOLN
15.0000 mL | Freq: Once | INTRAVENOUS | Status: AC | PRN
  Administered 2010-12-07: 15 mL via INTRAVENOUS

## 2010-12-09 ENCOUNTER — Ambulatory Visit (INDEPENDENT_AMBULATORY_CARE_PROVIDER_SITE_OTHER): Payer: Medicare Other | Admitting: *Deleted

## 2010-12-09 ENCOUNTER — Encounter: Payer: Medicare Other | Admitting: *Deleted

## 2010-12-09 DIAGNOSIS — I4891 Unspecified atrial fibrillation: Secondary | ICD-10-CM

## 2010-12-22 LAB — CHOLESTEROL, TOTAL: Cholesterol: 65

## 2010-12-22 LAB — HEPATIC FUNCTION PANEL
ALT: 18
Albumin: 3.7
Albumin: 3.8
Indirect Bilirubin: 0.7
Total Bilirubin: 0.8
Total Protein: 6.4
Total Protein: 6.4

## 2010-12-22 LAB — BASIC METABOLIC PANEL
BUN: 14
BUN: 15
CO2: 26
CO2: 28
CO2: 29
Calcium: 8.3 — ABNORMAL LOW
Calcium: 8.8
Chloride: 101
Chloride: 106
Chloride: 107
Creatinine, Ser: 0.89
GFR calc Af Amer: >60
Glucose, Bld: 114 — ABNORMAL HIGH
Glucose, Bld: 134 — ABNORMAL HIGH
Glucose, Bld: 136 — ABNORMAL HIGH
Potassium: 3.3 — ABNORMAL LOW
Potassium: 3.4 — ABNORMAL LOW
Potassium: 3.7
Sodium: 134 — ABNORMAL LOW
Sodium: 141
Sodium: 142

## 2010-12-22 LAB — DIFFERENTIAL
Basophils Absolute: 0.1
Basophils Relative: 0
Basophils Relative: 1
Eosinophils Absolute: 0
Eosinophils Absolute: 0.3
Eosinophils Relative: 0
Monocytes Absolute: 1.3 — ABNORMAL HIGH
Monocytes Relative: 15 — ABNORMAL HIGH
Monocytes Relative: 8
Neutro Abs: 6.7
Neutro Abs: 7.1
Neutrophils Relative %: 70

## 2010-12-22 LAB — CBC
HCT: 38.1 — ABNORMAL LOW
HCT: 39.6
HCT: 41
Hemoglobin: 12.9 — ABNORMAL LOW
Hemoglobin: 13.8
MCHC: 33.8
MCHC: 34.4
MCHC: 34.8
MCHC: 35.2
MCV: 92.1
MCV: 92.4
MCV: 93
MCV: 93.1
MCV: 93.3
Platelets: 143 — ABNORMAL LOW
Platelets: 145 — ABNORMAL LOW
Platelets: 160
Platelets: 164
Platelets: 174
Platelets: 190
RBC: 4.04 — ABNORMAL LOW
RBC: 4.23
RBC: 4.45
RBC: 4.58
RDW: 13.2
RDW: 13.6
RDW: 13.7
RDW: 13.7
RDW: 13.8
WBC: 10.3
WBC: 4.9
WBC: 8.8
WBC: 9

## 2010-12-22 LAB — PROTIME-INR
INR: 1.1
INR: 2.8 — ABNORMAL HIGH
Prothrombin Time: 15.3 — ABNORMAL HIGH
Prothrombin Time: 17.6 — ABNORMAL HIGH
Prothrombin Time: 30.8 — ABNORMAL HIGH

## 2010-12-22 LAB — COMPREHENSIVE METABOLIC PANEL
ALT: 10
ALT: 16
AST: 19
Albumin: 2.1 — ABNORMAL LOW
Albumin: 2.2 — ABNORMAL LOW
Alkaline Phosphatase: 28 — ABNORMAL LOW
Alkaline Phosphatase: 56
Chloride: 104
Potassium: 3.6
Potassium: 4.5
Sodium: 134 — ABNORMAL LOW
Sodium: 137
Total Bilirubin: 1
Total Protein: 4.2 — ABNORMAL LOW
Total Protein: 5 — ABNORMAL LOW

## 2010-12-22 LAB — URINALYSIS, ROUTINE W REFLEX MICROSCOPIC
Glucose, UA: NEGATIVE
Hgb urine dipstick: NEGATIVE
Specific Gravity, Urine: 1.022
pH: 7.5

## 2010-12-22 LAB — APTT: aPTT: 34

## 2010-12-22 LAB — POCT I-STAT, CHEM 8
BUN: 20
Chloride: 103
Creatinine, Ser: 1.6 — ABNORMAL HIGH
Potassium: 3.6
Sodium: 139
TCO2: 27

## 2010-12-22 LAB — HEPARIN LEVEL (UNFRACTIONATED)
Heparin Unfractionated: 0.11 — ABNORMAL LOW
Heparin Unfractionated: 0.19 — ABNORMAL LOW
Heparin Unfractionated: 0.48
Heparin Unfractionated: <0.1 — ABNORMAL LOW

## 2010-12-22 LAB — LACTIC ACID, PLASMA: Lactic Acid, Venous: 0.8

## 2010-12-22 LAB — PHOSPHORUS
Phosphorus: 2.1 — ABNORMAL LOW
Phosphorus: 3.6

## 2010-12-22 LAB — MAGNESIUM: Magnesium: 2.1

## 2010-12-22 LAB — TRIGLYCERIDES: Triglycerides: 44

## 2010-12-22 LAB — LIPASE, BLOOD: Lipase: 20

## 2011-01-06 ENCOUNTER — Ambulatory Visit (INDEPENDENT_AMBULATORY_CARE_PROVIDER_SITE_OTHER): Payer: Medicare Other | Admitting: *Deleted

## 2011-01-06 DIAGNOSIS — I4891 Unspecified atrial fibrillation: Secondary | ICD-10-CM

## 2011-01-06 LAB — POCT INR: INR: 3

## 2011-02-03 ENCOUNTER — Ambulatory Visit (INDEPENDENT_AMBULATORY_CARE_PROVIDER_SITE_OTHER): Payer: Medicare Other | Admitting: *Deleted

## 2011-02-03 DIAGNOSIS — I4891 Unspecified atrial fibrillation: Secondary | ICD-10-CM

## 2011-02-26 ENCOUNTER — Encounter: Payer: Self-pay | Admitting: Neurology

## 2011-03-02 ENCOUNTER — Encounter: Payer: Self-pay | Admitting: Internal Medicine

## 2011-03-02 ENCOUNTER — Ambulatory Visit (INDEPENDENT_AMBULATORY_CARE_PROVIDER_SITE_OTHER): Payer: Medicare Other | Admitting: Internal Medicine

## 2011-03-02 ENCOUNTER — Ambulatory Visit (INDEPENDENT_AMBULATORY_CARE_PROVIDER_SITE_OTHER): Payer: Medicare Other | Admitting: *Deleted

## 2011-03-02 DIAGNOSIS — I4891 Unspecified atrial fibrillation: Secondary | ICD-10-CM

## 2011-03-02 DIAGNOSIS — I10 Essential (primary) hypertension: Secondary | ICD-10-CM

## 2011-03-02 DIAGNOSIS — I498 Other specified cardiac arrhythmias: Secondary | ICD-10-CM

## 2011-03-02 LAB — POCT INR: INR: 3.3

## 2011-03-02 NOTE — Patient Instructions (Signed)
Your physician recommends that you schedule a follow-up appointment in: 4 weeks with nurse visit for an EKG and 2 months with Dr [name]

## 2011-03-03 ENCOUNTER — Encounter: Payer: Self-pay | Admitting: Internal Medicine

## 2011-03-03 NOTE — Assessment & Plan Note (Signed)
His bradycardia is asymptomatic.

## 2011-03-03 NOTE — Assessment & Plan Note (Signed)
His blood pressure remains well controlled. We will continue his current medical therapy, and maintain a low-sodium diet.

## 2011-03-03 NOTE — Assessment & Plan Note (Signed)
Today we discussed the treatment options with the patient. One option would be to discontinue flecainide and admit the patient for initiation of dofetilide. A second option would be to try amiodarone. A third option would be to discontinue flecainide and use a strategy of rate control. Finally we decided to continue his current medical therapy and recheck his ECG in one to 2 months. If he has reverted back to sinus rhythm, and he will continue his flecainide. If he remains in A. Fib at that time, the flecainide will be discontinued and a strategy of rate control would be most likely recommended.

## 2011-03-03 NOTE — Progress Notes (Signed)
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 He is a very pleasant elderly man with a history of paroxysmal atrial fibrillation which has been controlled with flecainide. The patient has recently returned to atrial fibrillation having been predominantly in sinus rhythm. He was not aware of that he had gone out of rhythm. He has had no syncope. He denies chest pain, shortness of breath, or peripheral edema. There is no medical or dietary noncompliance. Allergies  Allergen Reactions  . Sulfonamide Derivatives      Current Outpatient Prescriptions  Medication Sig Dispense Refill  . B Complex Vitamins (VITAMIN-B COMPLEX PO) 1 tab po qd       . Calcium 600-200 MG-UNIT per tablet Take 1 tablet by mouth daily.        . celecoxib (CELEBREX) 200 MG capsule Take 200 mg by mouth daily.        . Cetirizine HCl (ZYRTEC ALLERGY PO) Take 10 mg by mouth daily.        . cholecalciferol (VITAMIN D) 400 UNITS TABS Take 1,000 Units by mouth.        . finasteride (PROSCAR) 5 MG tablet Take 5 mg by mouth daily.        . flecainide (TAMBOCOR) 150 MG tablet Take 150 mg by mouth 2 (two) times daily.        . fluticasone (FLONASE) 50 MCG/ACT nasal spray Place 2 sprays into the nose daily.        . Fluticasone-Salmeterol (ADVAIR DISKUS) 250-50 MCG/DOSE AEPB Inhale 1 puff into the lungs 2 (two) times daily.        . Guaifenesin-Codeine (GUAIFENESIN AC PO) Take by mouth daily.        . montelukast (SINGULAIR) 10 MG tablet Take 10 mg by mouth at bedtime.        . NON FORMULARY allergy shots       . omeprazole (PRILOSEC) 20 MG capsule Take 20 mg by mouth daily.        . rosuvastatin (CRESTOR) 5 MG tablet Take 5 mg by mouth daily.        . temazepam (RESTORIL) 15 MG capsule Take 15 mg by mouth daily.        Marland Kitchen warfarin (COUMADIN) 5 MG tablet Take by mouth as directed.        . zolpidem (AMBIEN) 10 MG tablet Take 10 mg by mouth at bedtime as needed.           Past Medical History  Diagnosis Date  . Bradycardia   .  Palpitations   . Asthma   . Hemorrhoids   . Hx of adenomatous colonic polyps   . Atrial fibrillation   . Hypertension   . Pulmonary aspergillosis     ROS:   All systems reviewed and negative except as noted in the HPI.   Past Surgical History  Procedure Date  . Colon resection   . Total hip arthroplasty   . Appendectomy   . Laminectomy   . Cystoscopy      No family history on file.   History   Social History  . Marital Status: Married    Spouse Name: N/A    Number of Children: N/A  . Years of Education: N/A   Occupational History  . retired physician    Social History Main Topics  . Smoking status: Former Games developer  . Smokeless tobacco: Not on file  . Alcohol Use: 0.0 oz/week  . Drug Use: No  . Sexually Active: Not  on file   Other Topics Concern  . Not on file   Social History Narrative  . No narrative on file     BP 110/66  Pulse 65  Ht 5\' 10"  (1.778 m)  Wt 80.287 kg (177 lb)  BMI 25.40 kg/m2  Physical Exam:  Well appearing elderly man, NAD HEENT: Unremarkable Neck:  No JVD, no thyromegally Lymphatics:  No adenopathy Back:  No CVA tenderness Lungs:  Clear with no wheezes, rales, or rhonchi. HEART:  IRegular rate rhythm, no murmurs, no rubs, no clicks Abd:  soft, positive bowel sounds, no organomegally, no rebound, no guarding Ext:  2 plus pulses, no edema, no cyanosis, no clubbing Skin:  No rashes no nodules Neuro:  CN II through XII intact, motor grossly intact  EKG Atrial fibrillation with right axis deviation.  Assess/Plan:

## 2011-03-15 ENCOUNTER — Ambulatory Visit (INDEPENDENT_AMBULATORY_CARE_PROVIDER_SITE_OTHER): Payer: Medicare Other | Admitting: *Deleted

## 2011-03-15 DIAGNOSIS — I4891 Unspecified atrial fibrillation: Secondary | ICD-10-CM

## 2011-03-15 LAB — POCT INR: INR: 2.4

## 2011-04-09 ENCOUNTER — Other Ambulatory Visit: Payer: Medicare Other

## 2011-04-09 ENCOUNTER — Encounter: Payer: Self-pay | Admitting: Neurology

## 2011-04-09 ENCOUNTER — Ambulatory Visit (INDEPENDENT_AMBULATORY_CARE_PROVIDER_SITE_OTHER): Payer: Medicare Other | Admitting: Neurology

## 2011-04-09 VITALS — BP 120/76 | HR 86 | Wt 167.0 lb

## 2011-04-09 DIAGNOSIS — IMO0002 Reserved for concepts with insufficient information to code with codable children: Secondary | ICD-10-CM

## 2011-04-09 DIAGNOSIS — M5410 Radiculopathy, site unspecified: Secondary | ICD-10-CM

## 2011-04-09 NOTE — Progress Notes (Signed)
Dear Dr. Clelia Croft,  Thank you for having me see [name] in consultation today at [facility] for his problem with a possible peripheral neuropathy.  As you may recall, he is a 78 y.o. year old male with a history of a atrial fibrillation, hypertension, right L4-L5 laminectomy who presents with worsening numbness in his extremities.  He endorses as multi-year history of parasthesias in his hands, face and feet.  However, in July of last year he took a trip to [place].  There was significant travel time involved and he noticed that throughout his stay that he had increasing numbness and loss of sensation in his legs.  This got so bad it made it difficult for him to walk.  He denies intercurrent viral illness.  He did think it also affected him arms. It was not clearly progressive after his trip, but continued, and thinking that it may be his drinking that was causing it, he stopped EtOH  in August.  He was also given a medrol dose pack in that same time frame.  At the end of August he had significant improvement.  An MRI of his head was done that did not reveal significant abnormality except for mild atrophy and WMD.  In October and November his leg numbness got significantly worse again and it has been progressive since then.  He did not return to EtOH.  Having read that flecainide may cause neuropathy he has recently stopped it.  In addition, he notes difficulty walking over the last 2 years with about 10 falls.  He is worse in the dark or when he showers.  He thinks has walking was considerably worse with the worsening numbness in his arms or legs.  Past Medical History  Diagnosis Date  . Bradycardia   . Palpitations   . Asthma   . Hemorrhoids   . Hx of adenomatous colonic polyps   . Atrial fibrillation   . Hypertension   . Pulmonary aspergillosis     Past Surgical History  Procedure Date  . Colon resection   . Total hip arthroplasty   . Appendectomy   . Laminectomy     Right L4-L5  Laminectomy  . Cystoscopy     History   Social History  . Marital Status: Married    Spouse Name: N/A    Number of Children: N/A  . Years of Education: N/A   Occupational History  . retired physician    Social History Main Topics  . Smoking status: Former Smoker    Quit date: 03/29/1956  . Smokeless tobacco: Never Used  . Alcohol Use: No     Quit drinking EtOH in 2012.  Used to drink on weekends 2-3 bottles of wine.  . Drug Use: No  . Sexually Active: None   Other Topics Concern  . None   Social History Narrative  . None    No family history on file.  Current Outpatient Prescriptions on File Prior to Visit  Medication Sig Dispense Refill  . B Complex Vitamins (VITAMIN-B COMPLEX PO) 1 tab po qd       . Calcium 600-200 MG-UNIT per tablet Take 1 tablet by mouth daily.        . celecoxib (CELEBREX) 200 MG capsule Take 200 mg by mouth daily.        . Cetirizine HCl (ZYRTEC ALLERGY PO) Take 10 mg by mouth daily.        . cholecalciferol (VITAMIN D) 400 UNITS TABS Take 1,000  Units by mouth.        . finasteride (PROSCAR) 5 MG tablet Take 5 mg by mouth daily.        . fluticasone (FLONASE) 50 MCG/ACT nasal spray Place 2 sprays into the nose daily.        . Guaifenesin-Codeine (GUAIFENESIN AC PO) Take by mouth daily.        . montelukast (SINGULAIR) 10 MG tablet Take 10 mg by mouth at bedtime.        . NON FORMULARY allergy shots       . omeprazole (PRILOSEC) 20 MG capsule Take 20 mg by mouth daily.        . rosuvastatin (CRESTOR) 5 MG tablet Take 5 mg by mouth daily.        . temazepam (RESTORIL) 15 MG capsule Take 15 mg by mouth daily.        Marland Kitchen warfarin (COUMADIN) 5 MG tablet Take by mouth as directed.        . zolpidem (AMBIEN) 10 MG tablet Take 10 mg by mouth at bedtime as needed.        . flecainide (TAMBOCOR) 150 MG tablet Take 150 mg by mouth 2 (two) times daily.        . Fluticasone-Salmeterol (ADVAIR DISKUS) 250-50 MCG/DOSE AEPB Inhale 1 puff into the lungs 2 (two)  times daily.          Allergies  Allergen Reactions  . Sulfonamide Derivatives       ROS:  13 systems were reviewed and are notable for urinary urgency as well as frequency but no incontinence.  All other review of systems are unremarkable.   Examination:  Filed Vitals:   04/09/11 1325  BP: 120/76  Pulse: 86  Weight: 167 lb (75.751 kg)     In general, very well appearing older man.  Cardiovascular: The patient has a irregularly irregular and no carotid bruits.  Fundoscopy:  Disks are flat. Vessel caliber within normal limits.  Mental status:   The patient is oriented to person, place and time. Recent and remote memory are intact. Attention span and concentration are normal. Language including repetition, naming, following commands are intact. Fund of knowledge of current and historical events, as well as vocabulary are normal.  Cranial Nerves: Pupils are equally round and reactive to light. Visual fields full to confrontation. Extraocular movements are intact without nystagmus. Facial sensation and muscles of mastication are intact. Muscles of facial expression are symmetric. Hearing intact to bilateral finger rub. Tongue protrusion, uvula, palate midline.  Shoulder shrug intact  Motor:  The patient has normal bulk and tone(some paratonia while lying), no pronator drift.  There is a mild postural tremor, likely essential.  5/5 bilaterally.  Reflexes:   Biceps  Triceps Brachioradialis Knee Ankle  Right 3+  3+  3+   3+ 0  Left  3+  3+  3+   3+ 0  Toes down  Coordination:  Normal finger to nose.  No dysdiadokinesia.  Sensation is decreased to temperature(normalization at knee) and vibration in a length dependent manner in his arms and legs, position sense mildly impaired.  Gait and Station are wide based.  Tandem gait was not checked.  Romberg is positive.   Impression/Recs:   Probable peripheral neuropathy.  However, I am also struck by his sensory ataxia which is  clearly causing his falls.  While this could be caused by a peripheral neuropathy his reflexes are quite brisk at his knees making me worried  he may have some posterior column dysfunction perhaps due to a cervical myelopathy.  The quick evolution then improvement then worsening of his neuropathy is also interesting, making me wonder if there is a demyelinating component to his neuropathy.  I am going to proceed with an EMG/NCS of his left hemibody(as he had a lumbar laminectomy on the right) to see if there are signs of demyelination.  I am also going to get an MRI of his C-spine to look for stenosis.  Finally, I have ordered an MMA, Vitamin E, Copper level to look for other causes of neuropathy that you have not already ordered.  I may want to consider a OGTT in the future as well - but frankly I don't think impaired glucose tolerance can explain this degree of neuropathy.  The suggestion that flecainide may have caused this is intriguing -- there certainly are case reports of this, so we will see if he improves off of the flecainide.   We will see the patient back in 6 weeks.  Thank you for having [place] see Zamarion Longest Saine in consultation.  Feel free to contact me with any questions.  Lupita Raider Modesto Charon, MD [facility], [place] [address] [place], [place] [address] Phone: [phone] Fax: [phone].

## 2011-04-09 NOTE — Patient Instructions (Addendum)
Go to the basement to have your labs drawn today.  Your MRI is scheduled at the [facility] right beside [facility]. The address is [address] [place]. Your appointment is Sunday, January 13th at 10:00 am. Please arrive at 9:45 am. [phone]   Your follow up appointment with Dr. Modesto Charon is scheduled on February  25th at 10:00 am.  Your NCV/EMG is scheduled at [facility] located at [address] in [place] on February 11th at 9:00 am. Please arrive 15 minutes early.  [phone]

## 2011-04-11 ENCOUNTER — Encounter: Payer: Self-pay | Admitting: Neurology

## 2011-04-11 ENCOUNTER — Ambulatory Visit (HOSPITAL_COMMUNITY)
Admission: RE | Admit: 2011-04-11 | Discharge: 2011-04-11 | Disposition: A | Discharge status: Home / Self Care | Payer: Medicare Other | Source: Ambulatory Visit | Attending: Neurology | Admitting: Neurology

## 2011-04-11 DIAGNOSIS — R29898 Other symptoms and signs involving the musculoskeletal system: Secondary | ICD-10-CM | POA: Insufficient documentation

## 2011-04-11 DIAGNOSIS — M5410 Radiculopathy, site unspecified: Secondary | ICD-10-CM

## 2011-04-11 DIAGNOSIS — M47812 Spondylosis without myelopathy or radiculopathy, cervical region: Secondary | ICD-10-CM | POA: Diagnosis not present

## 2011-04-11 DIAGNOSIS — M542 Cervicalgia: Secondary | ICD-10-CM | POA: Diagnosis not present

## 2011-04-11 DIAGNOSIS — M129 Arthropathy, unspecified: Secondary | ICD-10-CM | POA: Insufficient documentation

## 2011-04-11 DIAGNOSIS — M5020 Other cervical disc displacement, unspecified cervical region: Secondary | ICD-10-CM | POA: Diagnosis not present

## 2011-04-11 DIAGNOSIS — M5030 Other cervical disc degeneration, unspecified cervical region: Secondary | ICD-10-CM | POA: Diagnosis not present

## 2011-04-11 NOTE — Progress Notes (Signed)
Reviewed outside records I was able to find labs - normal BMP, normal CBC, normal TSH, A1C 5.5%, normal SPEP, B12 in 300s.  Did not see Thiamine

## 2011-04-12 ENCOUNTER — Ambulatory Visit (INDEPENDENT_AMBULATORY_CARE_PROVIDER_SITE_OTHER): Payer: Medicare Other | Admitting: *Deleted

## 2011-04-12 DIAGNOSIS — I4891 Unspecified atrial fibrillation: Secondary | ICD-10-CM | POA: Diagnosis not present

## 2011-04-12 LAB — POCT INR: INR: 2.9

## 2011-04-13 ENCOUNTER — Ambulatory Visit: Payer: Medicare Other | Attending: Internal Medicine | Admitting: Physical Therapy

## 2011-04-13 DIAGNOSIS — Z96649 Presence of unspecified artificial hip joint: Secondary | ICD-10-CM | POA: Diagnosis not present

## 2011-04-13 DIAGNOSIS — IMO0001 Reserved for inherently not codable concepts without codable children: Secondary | ICD-10-CM | POA: Diagnosis not present

## 2011-04-13 DIAGNOSIS — R269 Unspecified abnormalities of gait and mobility: Secondary | ICD-10-CM | POA: Diagnosis not present

## 2011-04-13 DIAGNOSIS — Z9181 History of falling: Secondary | ICD-10-CM | POA: Diagnosis not present

## 2011-04-13 LAB — METHYLMALONIC ACID, SERUM: Methylmalonic Acid, Quant: 0.34 umol/L (ref <0.40)

## 2011-04-15 LAB — COPPER, SERUM: Copper: 86 ug/dL (ref 70–175)

## 2011-04-19 ENCOUNTER — Telehealth: Payer: Self-pay | Admitting: Neurology

## 2011-04-19 NOTE — Telephone Encounter (Signed)
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

## 2011-04-19 NOTE — Telephone Encounter (Signed)
Called and spoke with the patient. Information given as directed by Dr. Modesto Charon re: normal lab values. No other issues voiced at this time.

## 2011-04-21 ENCOUNTER — Encounter: Payer: Medicare Other | Admitting: Physical Therapy

## 2011-04-21 ENCOUNTER — Telehealth: Payer: Self-pay | Admitting: *Deleted

## 2011-04-21 NOTE — Telephone Encounter (Signed)
Pt called  To inform us that he will start Avelox today, appt s/c for recheck on Monday.

## 2011-04-26 ENCOUNTER — Ambulatory Visit (INDEPENDENT_AMBULATORY_CARE_PROVIDER_SITE_OTHER): Payer: Medicare Other

## 2011-04-26 DIAGNOSIS — I4891 Unspecified atrial fibrillation: Secondary | ICD-10-CM

## 2011-04-26 LAB — POCT INR: INR: 5.1

## 2011-04-27 ENCOUNTER — Ambulatory Visit: Payer: Medicare Other | Admitting: Physical Therapy

## 2011-04-27 ENCOUNTER — Encounter: Payer: Self-pay | Admitting: Internal Medicine

## 2011-04-27 ENCOUNTER — Encounter: Payer: Medicare Other | Admitting: Physical Therapy

## 2011-04-27 DIAGNOSIS — Z96649 Presence of unspecified artificial hip joint: Secondary | ICD-10-CM | POA: Diagnosis not present

## 2011-04-27 DIAGNOSIS — Z9181 History of falling: Secondary | ICD-10-CM | POA: Diagnosis not present

## 2011-04-27 DIAGNOSIS — IMO0001 Reserved for inherently not codable concepts without codable children: Secondary | ICD-10-CM | POA: Diagnosis not present

## 2011-04-27 DIAGNOSIS — R269 Unspecified abnormalities of gait and mobility: Secondary | ICD-10-CM | POA: Diagnosis not present

## 2011-05-03 ENCOUNTER — Ambulatory Visit (INDEPENDENT_AMBULATORY_CARE_PROVIDER_SITE_OTHER): Payer: Medicare Other | Admitting: Internal Medicine

## 2011-05-03 ENCOUNTER — Ambulatory Visit (INDEPENDENT_AMBULATORY_CARE_PROVIDER_SITE_OTHER): Payer: Medicare Other | Admitting: *Deleted

## 2011-05-03 ENCOUNTER — Ambulatory Visit: Payer: Medicare Other | Attending: Internal Medicine | Admitting: Physical Therapy

## 2011-05-03 ENCOUNTER — Encounter: Payer: Self-pay | Admitting: Internal Medicine

## 2011-05-03 VITALS — BP 120/80 | HR 94 | Wt 168.0 lb

## 2011-05-03 DIAGNOSIS — I4891 Unspecified atrial fibrillation: Secondary | ICD-10-CM

## 2011-05-03 DIAGNOSIS — I10 Essential (primary) hypertension: Secondary | ICD-10-CM | POA: Diagnosis not present

## 2011-05-03 DIAGNOSIS — IMO0001 Reserved for inherently not codable concepts without codable children: Secondary | ICD-10-CM | POA: Insufficient documentation

## 2011-05-03 DIAGNOSIS — R269 Unspecified abnormalities of gait and mobility: Secondary | ICD-10-CM | POA: Insufficient documentation

## 2011-05-03 LAB — POCT INR: INR: 2.6

## 2011-05-03 MED ORDER — VERAPAMIL HCL ER 180 MG PO TBCR: 180.0000 mg | EXTENDED_RELEASE_TABLET | Freq: Every day | ORAL | Status: DC

## 2011-05-03 NOTE — Assessment & Plan Note (Signed)
His blood pressure appears to be well-controlled. He has borderline hypertension at baseline.

## 2011-05-03 NOTE — Patient Instructions (Signed)
Your physician recommends that you schedule a follow-up appointment in: 8 weeks with Dr [name]   Your physician has recommended you make the following change in your medication:  1) Start Verapamil 180mg  daily

## 2011-05-03 NOTE — Assessment & Plan Note (Signed)
Today we discussed the treatment options. Rate versus rhythm control were considered. We'll try him on rate control and I've started verapamil 180 mg daily. If verapamil is unsuccessful at controlling his rate, then we would consider dofetilide for rhythm control.

## 2011-05-03 NOTE — Progress Notes (Signed)
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 He is a very pleasant 78 year old man with paroxysmal atrial fibrillation. He was maintained in sinus rhythm very nicely for many years on flecainide. He has developed breakthrough atrial fibrillation on flecainide and this drug was discontinued several weeks ago. The patient now feels palpitations on a regular basis. At rest he feels okay but with any activity he feels his heart race and gets short of breath very quickly. His activity has been curtailed by his atrial fibrillation. He denies chest pain or peripheral edema. Allergies  Allergen Reactions  . Sulfonamide Derivatives      Current Outpatient Prescriptions  Medication Sig Dispense Refill  . B Complex Vitamins (VITAMIN-B COMPLEX PO) 1 tab po qd       . beclomethasone (QVAR) 80 MCG/ACT inhaler Inhale 2 puffs into the lungs 2 (two) times daily.      . Calcium 600-200 MG-UNIT per tablet Take 1 tablet by mouth daily.        . celecoxib (CELEBREX) 200 MG capsule Take 200 mg by mouth daily.        . Cetirizine HCl (ZYRTEC ALLERGY PO) Take 10 mg by mouth daily.        . cholecalciferol (VITAMIN D) 400 UNITS TABS Take 1,000 Units by mouth.        . finasteride (PROSCAR) 5 MG tablet Take 5 mg by mouth daily.        . fluticasone (FLONASE) 50 MCG/ACT nasal spray Place 2 sprays into the nose daily.        . Fluticasone-Salmeterol (ADVAIR DISKUS) 250-50 MCG/DOSE AEPB Inhale 1 puff into the lungs 2 (two) times daily.        . Guaifenesin-Codeine (GUAIFENESIN AC PO) Take by mouth daily.        . montelukast (SINGULAIR) 10 MG tablet Take 10 mg by mouth at bedtime.        . NON FORMULARY allergy shots       . omeprazole (PRILOSEC) 20 MG capsule Take 20 mg by mouth daily.        . rosuvastatin (CRESTOR) 5 MG tablet Take 5 mg by mouth daily.        . temazepam (RESTORIL) 15 MG capsule Take 15 mg by mouth daily.        Marland Kitchen warfarin (COUMADIN) 5 MG tablet Take by mouth as directed.        . zolpidem (AMBIEN) 10  MG tablet Take 10 mg by mouth at bedtime as needed.        . verapamil (CALAN-SR) 180 MG CR tablet Take 1 tablet (180 mg total) by mouth daily.  30 tablet  11     Past Medical History  Diagnosis Date  . Bradycardia   . Palpitations   . Asthma   . Hemorrhoids   . Hx of adenomatous colonic polyps   . Atrial fibrillation   . Hypertension   . Pulmonary aspergillosis   . Diverticulosis of colon   . COPD (chronic obstructive pulmonary disease)     mild    ROS:   All systems reviewed and negative except as noted in the HPI.   Past Surgical History  Procedure Date  . Colon resection   . Total hip arthroplasty   . Appendectomy   . Laminectomy     Right L4-L5 Laminectomy  . Cystoscopy   . Rotator cuff repair   . Umbilical hernia repair      Family History  Problem Relation  Age of Onset  . Lung cancer Father 60  . Stroke Mother 22  . Leukemia Brother   . Coronary artery disease Brother   . Heart failure Brother      History   Social History  . Marital Status: Married    Spouse Name: N/A    Number of Children: N/A  . Years of Education: N/A   Occupational History  . retired physician    Social History Main Topics  . Smoking status: Former Smoker    Quit date: 03/29/1956  . Smokeless tobacco: Never Used  . Alcohol Use: No     Quit drinking EtOH in 2012.  Used to drink on weekends 2-3 bottles of wine.  . Drug Use: No  . Sexually Active: Not on file   Other Topics Concern  . Not on file   Social History Narrative  . No narrative on file     BP 120/80  Pulse 94  Wt 76.204 kg (168 lb)  Physical Exam:  Well appearing elderly man, NAD HEENT: Unremarkable Neck:  No JVD, no thyromegally Lungs:  Clear with no wheezes, rales, or rhonchi. HEART:  Regular rate rhythm, no murmurs, no rubs, no clicks Abd:  soft, positive bowel sounds, no organomegally, no rebound, no guarding Ext:  2 plus pulses, no edema, no cyanosis, no clubbing Skin:  No rashes no  nodules Neuro:  CN II through XII intact, motor grossly intact  EKG Atrial fibrillation with a controlled ventricular response.  Assess/Plan:

## 2011-05-06 ENCOUNTER — Encounter: Payer: Medicare Other | Admitting: Physical Therapy

## 2011-05-10 ENCOUNTER — Encounter: Payer: Medicare Other | Admitting: Physical Therapy

## 2011-05-10 ENCOUNTER — Encounter: Payer: Medicare Other | Admitting: *Deleted

## 2011-05-10 DIAGNOSIS — G608 Other hereditary and idiopathic neuropathies: Secondary | ICD-10-CM | POA: Diagnosis not present

## 2011-05-12 ENCOUNTER — Telehealth: Payer: Self-pay | Admitting: Neurology

## 2011-05-12 NOTE — Telephone Encounter (Signed)
Pt called for results of NCS done in [facility]

## 2011-05-13 ENCOUNTER — Encounter: Payer: Medicare Other | Admitting: Physical Therapy

## 2011-05-13 ENCOUNTER — Ambulatory Visit: Payer: Medicare Other | Admitting: Physical Therapy

## 2011-05-13 DIAGNOSIS — IMO0001 Reserved for inherently not codable concepts without codable children: Secondary | ICD-10-CM | POA: Diagnosis not present

## 2011-05-13 DIAGNOSIS — R269 Unspecified abnormalities of gait and mobility: Secondary | ICD-10-CM | POA: Diagnosis not present

## 2011-05-13 NOTE — Telephone Encounter (Signed)
talked to patient.  multiple radiculopathies, axonal sensorimotor neuropathy.  patient feels he is improving.  thinks the flecanide may have caused it.  Jan - Could you set up a MRI L-spine without contrast. thx.

## 2011-05-14 ENCOUNTER — Telehealth: Payer: Self-pay | Admitting: Internal Medicine

## 2011-05-14 ENCOUNTER — Telehealth: Payer: Self-pay | Admitting: Neurology

## 2011-05-14 ENCOUNTER — Other Ambulatory Visit: Payer: Self-pay | Admitting: Neurology

## 2011-05-14 DIAGNOSIS — G959 Disease of spinal cord, unspecified: Secondary | ICD-10-CM

## 2011-05-14 DIAGNOSIS — I4891 Unspecified atrial fibrillation: Secondary | ICD-10-CM

## 2011-05-14 DIAGNOSIS — M5410 Radiculopathy, site unspecified: Secondary | ICD-10-CM

## 2011-05-14 NOTE — Telephone Encounter (Signed)
Follow-up:    Patient just called to leave his cell phone number to contact him this afternoon.

## 2011-05-14 NOTE — Telephone Encounter (Signed)
Called and spoke with the pt. Per Dr. Nash Dimmer instruction, pt sch for MR L-spine on 2/18 at [facility] at 1:00 pm.

## 2011-05-14 NOTE — Telephone Encounter (Signed)
Spoke with pt, he has had great improvement in his tachycardia with the addition of the verapamil. He still reports 10 to 15 times daily having tachy palpitataions. He wants to know if ok to increase verapamil to 240 mgs. He is having no side effects from the verapamil. Offered to talk to the DOD in the office today but pt prefers to wait for dr taylor's input. Will forward for dr taylor's review

## 2011-05-14 NOTE — Telephone Encounter (Signed)
New msg Pt has question about verapamil Please call him back

## 2011-05-17 ENCOUNTER — Ambulatory Visit (HOSPITAL_COMMUNITY)
Admission: RE | Admit: 2011-05-17 | Discharge: 2011-05-17 | Disposition: A | Discharge status: Home / Self Care | Payer: Medicare Other | Source: Ambulatory Visit | Attending: Neurology | Admitting: Neurology

## 2011-05-17 DIAGNOSIS — M545 Low back pain, unspecified: Secondary | ICD-10-CM | POA: Diagnosis not present

## 2011-05-17 DIAGNOSIS — M5137 Other intervertebral disc degeneration, lumbosacral region: Secondary | ICD-10-CM | POA: Insufficient documentation

## 2011-05-17 DIAGNOSIS — M5126 Other intervertebral disc displacement, lumbar region: Secondary | ICD-10-CM | POA: Insufficient documentation

## 2011-05-17 DIAGNOSIS — G959 Disease of spinal cord, unspecified: Secondary | ICD-10-CM

## 2011-05-17 DIAGNOSIS — M51379 Other intervertebral disc degeneration, lumbosacral region without mention of lumbar back pain or lower extremity pain: Secondary | ICD-10-CM | POA: Insufficient documentation

## 2011-05-17 DIAGNOSIS — M5410 Radiculopathy, site unspecified: Secondary | ICD-10-CM

## 2011-05-17 DIAGNOSIS — I77811 Abdominal aortic ectasia: Secondary | ICD-10-CM | POA: Insufficient documentation

## 2011-05-17 DIAGNOSIS — R209 Unspecified disturbances of skin sensation: Secondary | ICD-10-CM | POA: Insufficient documentation

## 2011-05-17 DIAGNOSIS — M47817 Spondylosis without myelopathy or radiculopathy, lumbosacral region: Secondary | ICD-10-CM | POA: Diagnosis not present

## 2011-05-18 ENCOUNTER — Encounter: Payer: Medicare Other | Admitting: Physical Therapy

## 2011-05-20 ENCOUNTER — Encounter: Payer: Medicare Other | Admitting: Physical Therapy

## 2011-05-20 ENCOUNTER — Encounter: Payer: Self-pay | Admitting: Neurology

## 2011-05-20 MED ORDER — VERAPAMIL HCL ER 240 MG PO TBCR: 240.0000 mg | EXTENDED_RELEASE_TABLET | Freq: Every day | ORAL | Status: DC

## 2011-05-20 NOTE — Telephone Encounter (Signed)
Discussed with Dr [name] Will increase his Verapamil to 240mg  daily  Patient aware

## 2011-05-24 ENCOUNTER — Encounter: Payer: Self-pay | Admitting: Neurology

## 2011-05-24 ENCOUNTER — Other Ambulatory Visit: Payer: Medicare Other

## 2011-05-24 ENCOUNTER — Ambulatory Visit (INDEPENDENT_AMBULATORY_CARE_PROVIDER_SITE_OTHER): Payer: Medicare Other | Admitting: Neurology

## 2011-05-24 VITALS — BP 122/74 | HR 74 | Ht 69.5 in | Wt 170.0 lb

## 2011-05-24 DIAGNOSIS — G609 Hereditary and idiopathic neuropathy, unspecified: Secondary | ICD-10-CM | POA: Diagnosis not present

## 2011-05-24 DIAGNOSIS — IMO0002 Reserved for concepts with insufficient information to code with codable children: Secondary | ICD-10-CM | POA: Diagnosis not present

## 2011-05-24 DIAGNOSIS — M5410 Radiculopathy, site unspecified: Secondary | ICD-10-CM

## 2011-05-24 DIAGNOSIS — G629 Polyneuropathy, unspecified: Secondary | ICD-10-CM

## 2011-05-24 NOTE — Patient Instructions (Signed)
Go to the basement to have your labs drawn today.  . 

## 2011-05-24 NOTE — Progress Notes (Signed)
Dear Dr. Clelia Croft,  I saw  [name] back in [facility] for his problem with numbness and tingling in his legs, arms and face.  As you may recall, he is a 78 y.o. year old male with a history of recent worsening in the summer of sensory changes in his arms, legs and face that he attributed to the stress of taking a long trip to [place].  He felt that after several weeks the changes got better.  At his last visit I felt he likely had a distal symmetric peripheral neuropathy and ordered an EMG/NCS.  This revealed an sensorimotor axonal neuropathy as well as multilevel lumbar radiculopathies.  A C-spine MRI did not reveal significant stenosis(ordered for his brisk reflexes) and MRI L-spine did not show any significant stenosis, but did show multilevel foraminal disease.  He returns today saying that overall his symptoms are better.  However, after a swimming session in the pool last Thursday he had transient worsening of his symptoms in his legs, hands and face.  The parasthesias are not painful, but rather just bothersome.  He went to the gait clinic [facility] the [facility] center and they felt his gait was fine.  He mentions a diagnosis of neuronopathy, and wonders if he may have Sjogren's disease.    Medical history, social history, and family history were reviewed and have not changed since the last clinic visit.  Current Outpatient Prescriptions on File Prior to Visit  Medication Sig Dispense Refill  . B Complex Vitamins (VITAMIN-B COMPLEX PO) 1 tab po qd       . beclomethasone (QVAR) 80 MCG/ACT inhaler Inhale 2 puffs into the lungs 2 (two) times daily.      . Calcium 600-200 MG-UNIT per tablet Take 1 tablet by mouth daily.        . celecoxib (CELEBREX) 200 MG capsule Take 200 mg by mouth daily.        . Cetirizine HCl (ZYRTEC ALLERGY PO) Take 10 mg by mouth daily.        . cholecalciferol (VITAMIN D) 400 UNITS TABS Take 1,000 Units by mouth.        . finasteride (PROSCAR) 5 MG tablet  Take 5 mg by mouth daily.        . fluticasone (FLONASE) 50 MCG/ACT nasal spray Place 2 sprays into the nose daily.        . Fluticasone-Salmeterol (ADVAIR DISKUS) 250-50 MCG/DOSE AEPB Inhale 1 puff into the lungs 2 (two) times daily.        . Guaifenesin-Codeine (GUAIFENESIN AC PO) Take by mouth daily.        . montelukast (SINGULAIR) 10 MG tablet Take 10 mg by mouth at bedtime.        . NON FORMULARY allergy shots       . omeprazole (PRILOSEC) 20 MG capsule Take 20 mg by mouth daily.        . rosuvastatin (CRESTOR) 5 MG tablet Take 5 mg by mouth daily.        . temazepam (RESTORIL) 15 MG capsule Take 15 mg by mouth daily.        . verapamil (CALAN-SR) 240 MG CR tablet Take 1 tablet (240 mg total) by mouth daily.  30 tablet  11  . warfarin (COUMADIN) 5 MG tablet Take by mouth as directed.        . zolpidem (AMBIEN) 10 MG tablet Take 10 mg by mouth at bedtime as needed.  Allergies  Allergen Reactions  . Sulfonamide Derivatives     ROS:  13 systems were reviewed and are notable for dry eyes and dry mouth.  All other review of systems are unremarkable.  Exam: . Filed Vitals:   05/24/11 1006  BP: 122/74  Pulse: 74  Height: 5' 9.5" (1.765 m)  Weight: 170 lb (77.111 kg)    In general, well appearing man.  Mental status:   The patient is oriented to person, place and time. Recent and remote memory are intact. Attention span and concentration are normal. Language including repetition, naming, following commands are intact. Fund of knowledge of current and historical events, as well as vocabulary are normal.    Motor:  Normal bulk and tone, no drift and 5/5 muscle strength bilaterally.  Reflexes:  3+ thoughout arms and legs, except absent patella.  Sensation:  Vibratory, temperature loss length dep manner. ?Decreased temp sense in left face in length dep manner. Position sense decreased in feet as well.  Gait:  Mildly wide based gait and station.  Romberg  -  Impression/Recommendations:  1.  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  I am going to send off ANA, RF and Sjogren's labs as this is associated with a neuronopathy. Some of his gait instability may be from his multilevel radicular disease as well.  We will see the patient back in 3 months.  Lupita Raider Modesto Charon, MD [facility], [place]

## 2011-05-25 LAB — RHEUMATOID FACTOR: Rhuematoid fact SerPl-aCnc: <10 IU/mL (ref <=14)

## 2011-05-27 ENCOUNTER — Encounter: Payer: Medicare Other | Admitting: Physical Therapy

## 2011-05-31 ENCOUNTER — Ambulatory Visit (INDEPENDENT_AMBULATORY_CARE_PROVIDER_SITE_OTHER): Payer: Medicare Other | Admitting: *Deleted

## 2011-05-31 DIAGNOSIS — I4891 Unspecified atrial fibrillation: Secondary | ICD-10-CM | POA: Diagnosis not present

## 2011-05-31 LAB — POCT INR: INR: 3.5

## 2011-06-01 ENCOUNTER — Telehealth: Payer: Self-pay | Admitting: Neurology

## 2011-06-01 NOTE — Telephone Encounter (Signed)
Spoke with Mrs. Broski. Informed all lab work was normal for her husband. No other issues voiced at this time.

## 2011-06-01 NOTE — Telephone Encounter (Signed)
Message copied by Benay Spice on Tue Jun 01, 2011 11:17 AM ------      Message from: Milas Gain      Created: Mon May 31, 2011  8:48 PM       Jan.  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

## 2011-06-01 NOTE — Progress Notes (Signed)
Called and spoke with the patient's wife. Information given re: normal lab results. No other issues voiced at this time.

## 2011-06-14 ENCOUNTER — Ambulatory Visit (INDEPENDENT_AMBULATORY_CARE_PROVIDER_SITE_OTHER): Payer: Medicare Other | Admitting: *Deleted

## 2011-06-14 DIAGNOSIS — I4891 Unspecified atrial fibrillation: Secondary | ICD-10-CM | POA: Diagnosis not present

## 2011-06-23 ENCOUNTER — Encounter: Payer: Self-pay | Admitting: Internal Medicine

## 2011-06-23 ENCOUNTER — Ambulatory Visit (INDEPENDENT_AMBULATORY_CARE_PROVIDER_SITE_OTHER): Payer: Medicare Other | Admitting: Internal Medicine

## 2011-06-23 VITALS — BP 110/60 | HR 63 | Ht 69.5 in | Wt 170.1 lb

## 2011-06-23 DIAGNOSIS — I4891 Unspecified atrial fibrillation: Secondary | ICD-10-CM

## 2011-06-23 DIAGNOSIS — I10 Essential (primary) hypertension: Secondary | ICD-10-CM | POA: Diagnosis not present

## 2011-06-23 NOTE — Progress Notes (Signed)
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 He is a very pleasant 78 year old man who now has permanent atrial flutter ablation. The patient is have rapid ventricular rates in A. fib until we started the calcium channel blocker verapamil. Since then he has felt much improved. He is able to ambulate without shortness of breath. He denies palpitations and has had no chest pain. He notes minimal palpitation when he has very strenuous activity. No syncope. Allergies  Allergen Reactions  . Sulfonamide Derivatives      Current Outpatient Prescriptions  Medication Sig Dispense Refill  . B Complex Vitamins (VITAMIN-B COMPLEX PO) 1 tab po qd       . Calcium 600-200 MG-UNIT per tablet Take 1 tablet by mouth daily.        . celecoxib (CELEBREX) 200 MG capsule Take 200 mg by mouth daily.        . Cetirizine HCl (ZYRTEC ALLERGY PO) Take 10 mg by mouth daily.        . cholecalciferol (VITAMIN D) 400 UNITS TABS Take 1,000 Units by mouth.        Marland Kitchen FINACEA 15 % cream as needed.      . finasteride (PROSCAR) 5 MG tablet Take 5 mg by mouth daily.        . fluticasone (FLONASE) 50 MCG/ACT nasal spray Place 2 sprays into the nose daily.        . Fluticasone-Salmeterol (ADVAIR DISKUS) 250-50 MCG/DOSE AEPB Inhale 1 puff into the lungs 2 (two) times daily.        . Guaifenesin-Codeine (GUAIFENESIN AC PO) Take by mouth as needed.       . montelukast (SINGULAIR) 10 MG tablet Take 10 mg by mouth at bedtime.        . NON FORMULARY allergy shots       . omeprazole (PRILOSEC) 20 MG capsule Take 20 mg by mouth daily.        . rosuvastatin (CRESTOR) 5 MG tablet Take 5 mg by mouth daily.        . temazepam (RESTORIL) 15 MG capsule Take 15 mg by mouth daily.        . verapamil (CALAN-SR) 240 MG CR tablet Take 1 tablet (240 mg total) by mouth daily.  30 tablet  11  . warfarin (COUMADIN) 5 MG tablet Take by mouth as directed.        . zolpidem (AMBIEN) 10 MG tablet Take 10 mg by mouth at bedtime as needed.        Marland Kitchen DISCONTD:  beclomethasone (QVAR) 80 MCG/ACT inhaler Inhale 2 puffs into the lungs 2 (two) times daily.         Past Medical History  Diagnosis Date  . Bradycardia   . Palpitations   . Asthma   . Hemorrhoids   . Hx of adenomatous colonic polyps   . Atrial fibrillation   . Hypertension   . Pulmonary aspergillosis   . Diverticulosis of colon   . COPD (chronic obstructive pulmonary disease)     mild    ROS:   All systems reviewed and negative except as noted in the HPI.   Past Surgical History  Procedure Date  . Colon resection   . Total hip arthroplasty   . Appendectomy   . Laminectomy     Right L4-L5 Laminectomy  . Cystoscopy   . Rotator cuff repair   . Umbilical hernia repair      Family History  Problem Relation Age of Onset  .  Lung cancer Father 86  . Stroke Mother 60  . Leukemia Brother   . Coronary artery disease Brother   . Heart failure Brother      History   Social History  . Marital Status: Married    Spouse Name: N/A    Number of Children: N/A  . Years of Education: N/A   Occupational History  . retired physician    Social History Main Topics  . Smoking status: Former Smoker    Quit date: 03/29/1956  . Smokeless tobacco: Never Used  . Alcohol Use: No     Quit drinking EtOH in 2012.  Used to drink on weekends 2-3 bottles of wine.  . Drug Use: No  . Sexually Active: Not on file   Other Topics Concern  . Not on file   Social History Narrative  . No narrative on file     BP 110/60  Pulse 63  Ht 5' 9.5" (1.765 m)  Wt 77.166 kg (170 lb 1.9 oz)  BMI 24.76 kg/m2  Physical Exam:  Well appearing elderly man, NAD HEENT: Unremarkable Neck:  No JVD, no thyromegally Lungs:  Clear with no wheezes, rales, or rhonchi. HEART:  Regular rate rhythm, no murmurs, no rubs, no clicks Abd:  soft, positive bowel sounds, no organomegally, no rebound, no guarding Ext:  2 plus pulses, no edema, no cyanosis, no clubbing Skin:  No rashes no nodules Neuro:  CN  II through XII intact, motor grossly intact  DEVICE  Normal device function.  See PaceArt for details.   Assess/Plan:

## 2011-06-23 NOTE — Assessment & Plan Note (Signed)
His blood pressure is well controlled. He'll continue his current medical therapy. 

## 2011-06-23 NOTE — Patient Instructions (Signed)
Your physician wants you to follow-up in: 6 months with Dr Taylor You will receive a reminder letter in the mail two months in advance. If you don't receive a letter, please call our office to schedule the follow-up appointment.  

## 2011-06-23 NOTE — Assessment & Plan Note (Signed)
He is now undergoing a right strategy of rate control. He appears to be very well we controlled with verapamil. No change in medications today. We discussed the possibility of switching warfarin to a different agent but for now he would like to stay on warfarin.

## 2011-06-28 ENCOUNTER — Ambulatory Visit (INDEPENDENT_AMBULATORY_CARE_PROVIDER_SITE_OTHER): Payer: Medicare Other | Admitting: Pharmacist

## 2011-06-28 DIAGNOSIS — I4891 Unspecified atrial fibrillation: Secondary | ICD-10-CM

## 2011-07-08 DIAGNOSIS — Z961 Presence of intraocular lens: Secondary | ICD-10-CM | POA: Diagnosis not present

## 2011-07-08 DIAGNOSIS — H35379 Puckering of macula, unspecified eye: Secondary | ICD-10-CM | POA: Diagnosis not present

## 2011-07-08 DIAGNOSIS — H2510 Age-related nuclear cataract, unspecified eye: Secondary | ICD-10-CM | POA: Diagnosis not present

## 2011-07-08 DIAGNOSIS — H40019 Open angle with borderline findings, low risk, unspecified eye: Secondary | ICD-10-CM | POA: Diagnosis not present

## 2011-07-08 DIAGNOSIS — H01009 Unspecified blepharitis unspecified eye, unspecified eyelid: Secondary | ICD-10-CM | POA: Diagnosis not present

## 2011-07-19 ENCOUNTER — Ambulatory Visit (INDEPENDENT_AMBULATORY_CARE_PROVIDER_SITE_OTHER): Payer: Medicare Other

## 2011-07-19 DIAGNOSIS — I4891 Unspecified atrial fibrillation: Secondary | ICD-10-CM

## 2011-07-19 LAB — POCT INR: INR: 2.1

## 2011-07-20 ENCOUNTER — Other Ambulatory Visit: Payer: Self-pay | Admitting: Dermatology

## 2011-07-20 DIAGNOSIS — L570 Actinic keratosis: Secondary | ICD-10-CM | POA: Diagnosis not present

## 2011-07-20 DIAGNOSIS — L578 Other skin changes due to chronic exposure to nonionizing radiation: Secondary | ICD-10-CM | POA: Diagnosis not present

## 2011-07-21 DIAGNOSIS — N401 Enlarged prostate with lower urinary tract symptoms: Secondary | ICD-10-CM | POA: Diagnosis not present

## 2011-07-30 DIAGNOSIS — J309 Allergic rhinitis, unspecified: Secondary | ICD-10-CM | POA: Diagnosis not present

## 2011-08-16 ENCOUNTER — Ambulatory Visit (INDEPENDENT_AMBULATORY_CARE_PROVIDER_SITE_OTHER): Payer: Medicare Other

## 2011-08-16 DIAGNOSIS — I4891 Unspecified atrial fibrillation: Secondary | ICD-10-CM | POA: Diagnosis not present

## 2011-08-24 ENCOUNTER — Ambulatory Visit: Payer: Medicare Other | Admitting: Neurology

## 2011-08-26 ENCOUNTER — Encounter: Payer: Self-pay | Admitting: Neurology

## 2011-08-26 ENCOUNTER — Ambulatory Visit (INDEPENDENT_AMBULATORY_CARE_PROVIDER_SITE_OTHER): Payer: Medicare Other | Admitting: Neurology

## 2011-08-26 VITALS — BP 112/68 | HR 70 | Wt 172.0 lb

## 2011-08-26 DIAGNOSIS — G609 Hereditary and idiopathic neuropathy, unspecified: Secondary | ICD-10-CM | POA: Diagnosis not present

## 2011-08-26 DIAGNOSIS — G629 Polyneuropathy, unspecified: Secondary | ICD-10-CM

## 2011-08-26 NOTE — Progress Notes (Signed)
Dear Dr. Clelia Croft,  I saw [name] back in [facility] for his problem with numbness and tingling in his legs, arms and face. As you may recall, he is a 78 y.o. year old male with a history of recent worsening in the summer of sensory changes in his arms, legs and face that he attributed to the stress of taking a long trip to [place]. He felt that after several weeks the changes got better. At his last visit I felt he likely had a distal symmetric peripheral neuropathy and ordered an EMG/NCS. This revealed an sensorimotor axonal neuropathy as well as multilevel lumbar radiculopathies. A C-spine MRI did not reveal significant stenosis(ordered for his brisk reflexes) and MRI L-spine did not show any significant stenosis, but did show multilevel foraminal disease.  He returns today saying that overall his symptoms are better. However, after a swimming session in the pool last Thursday he had transient worsening of his symptoms in his legs, hands and face. The parasthesias are not painful, but rather just bothersome.  He went to the gait clinic at the neurorehab center and they felt his gait was fine.  He mentions a diagnosis of neuronopathy, and wonders if he may have Sjogren's disease.  ----  At his last visit I sent off Sjogren's labs which were negative.  He continues to improve.  He thinks stopping the flecanide may explain his improvement of symptoms.  His gait remains stable.   Medical history, social history, and family history were reviewed and have not changed since the last clinic visit.  Current Outpatient Prescriptions on File Prior to Visit  Medication Sig Dispense Refill  . B Complex Vitamins (VITAMIN-B COMPLEX PO) 1 tab po qd       . Calcium 600-200 MG-UNIT per tablet Take 1 tablet by mouth daily.        . celecoxib (CELEBREX) 200 MG capsule Take 200 mg by mouth daily.        . Cetirizine HCl (ZYRTEC ALLERGY PO) Take 10 mg by mouth daily.        . cholecalciferol (VITAMIN  D) 400 UNITS TABS Take 1,000 Units by mouth.        Marland Kitchen FINACEA 15 % cream as needed.      . finasteride (PROSCAR) 5 MG tablet Take 5 mg by mouth daily.        . fluticasone (FLONASE) 50 MCG/ACT nasal spray Place 2 sprays into the nose daily.        . Fluticasone-Salmeterol (ADVAIR DISKUS) 250-50 MCG/DOSE AEPB Inhale 1 puff into the lungs 2 (two) times daily.        . Guaifenesin-Codeine (GUAIFENESIN AC PO) Take by mouth as needed.       . montelukast (SINGULAIR) 10 MG tablet Take 10 mg by mouth at bedtime.        . NON FORMULARY allergy shots       . omeprazole (PRILOSEC) 20 MG capsule Take 20 mg by mouth daily.        . rosuvastatin (CRESTOR) 5 MG tablet Take 5 mg by mouth daily.        . temazepam (RESTORIL) 15 MG capsule Take 15 mg by mouth daily.        . verapamil (CALAN-SR) 240 MG CR tablet Take 1 tablet (240 mg total) by mouth daily.  30 tablet  11  . warfarin (COUMADIN) 5 MG tablet Take by mouth as directed.        Marland Kitchen  zolpidem (AMBIEN) 10 MG tablet Take 10 mg by mouth at bedtime as needed.        Marland Kitchen DISCONTD: beclomethasone (QVAR) 80 MCG/ACT inhaler Inhale 2 puffs into the lungs 2 (two) times daily.        Allergies  Allergen Reactions  . Sulfonamide Derivatives     ROS:  13 systems were reviewed and  are unremarkable.  Exam: . Filed Vitals:   08/26/11 1001  BP: 112/68  Pulse: 70  Weight: 172 lb (78.019 kg)    In general, well appearing older man.   Motor:  Normal bulk and tone, no drift and 5/5 muscle strength bilaterally.  Reflexes:  2+ thoughout UE, 2+ at knees, absent at ankles.  Coordination:  Normal finger to nose  Sensation:  Decreased temp to knees.  Insensate to vibration in toes, but not ankle.  Position sense mildly impaired.  Gait:  Normal gait and station.  Romberg unsteady.  Impression/Recommendations:  1.  Numbness in legs - likely multifactorial, lumbar spine disease, idiopathic sensorimotor neuropathy, perhaps worsened with flecanide.  However, the  relationship to position makes me think much of his symptoms may be arising from his lower back.  However, he is doing well, and he will let me know if he worsens.  I don't think any other investigations or treatment are needed at this point in time.  Lupita Raider Modesto Charon, MD [facility], [place]

## 2011-09-06 DIAGNOSIS — J0100 Acute maxillary sinusitis, unspecified: Secondary | ICD-10-CM | POA: Diagnosis not present

## 2011-09-06 DIAGNOSIS — I10 Essential (primary) hypertension: Secondary | ICD-10-CM | POA: Diagnosis not present

## 2011-09-06 DIAGNOSIS — J449 Chronic obstructive pulmonary disease, unspecified: Secondary | ICD-10-CM | POA: Diagnosis not present

## 2011-09-06 DIAGNOSIS — I4891 Unspecified atrial fibrillation: Secondary | ICD-10-CM | POA: Diagnosis not present

## 2011-09-13 ENCOUNTER — Ambulatory Visit (INDEPENDENT_AMBULATORY_CARE_PROVIDER_SITE_OTHER): Payer: Medicare Other | Admitting: *Deleted

## 2011-09-13 DIAGNOSIS — I4891 Unspecified atrial fibrillation: Secondary | ICD-10-CM | POA: Diagnosis not present

## 2011-10-11 ENCOUNTER — Ambulatory Visit (INDEPENDENT_AMBULATORY_CARE_PROVIDER_SITE_OTHER): Payer: Medicare Other

## 2011-10-11 DIAGNOSIS — I4891 Unspecified atrial fibrillation: Secondary | ICD-10-CM | POA: Diagnosis not present

## 2011-10-11 LAB — POCT INR: INR: 2.8

## 2011-10-12 ENCOUNTER — Other Ambulatory Visit: Payer: Self-pay | Admitting: *Deleted

## 2011-10-12 DIAGNOSIS — I4891 Unspecified atrial fibrillation: Secondary | ICD-10-CM

## 2011-10-12 MED ORDER — VERAPAMIL HCL ER 240 MG PO TBCR: 240.0000 mg | EXTENDED_RELEASE_TABLET | Freq: Every day | ORAL | Status: DC

## 2011-10-22 DIAGNOSIS — L570 Actinic keratosis: Secondary | ICD-10-CM | POA: Diagnosis not present

## 2011-10-22 DIAGNOSIS — L578 Other skin changes due to chronic exposure to nonionizing radiation: Secondary | ICD-10-CM | POA: Diagnosis not present

## 2011-11-05 ENCOUNTER — Ambulatory Visit (INDEPENDENT_AMBULATORY_CARE_PROVIDER_SITE_OTHER): Payer: Medicare Other | Admitting: *Deleted

## 2011-11-05 DIAGNOSIS — I4891 Unspecified atrial fibrillation: Secondary | ICD-10-CM | POA: Diagnosis not present

## 2011-11-05 LAB — POCT INR: INR: 3

## 2011-11-15 ENCOUNTER — Ambulatory Visit (INDEPENDENT_AMBULATORY_CARE_PROVIDER_SITE_OTHER): Payer: Medicare Other | Admitting: *Deleted

## 2011-11-15 DIAGNOSIS — I4891 Unspecified atrial fibrillation: Secondary | ICD-10-CM | POA: Diagnosis not present

## 2011-11-15 LAB — POCT INR: INR: 3.7

## 2011-11-17 ENCOUNTER — Encounter: Payer: Self-pay | Admitting: Gastroenterology

## 2011-11-26 ENCOUNTER — Encounter: Payer: Self-pay | Admitting: Gastroenterology

## 2011-12-01 ENCOUNTER — Ambulatory Visit (INDEPENDENT_AMBULATORY_CARE_PROVIDER_SITE_OTHER): Payer: Medicare Other | Admitting: *Deleted

## 2011-12-01 DIAGNOSIS — I4891 Unspecified atrial fibrillation: Secondary | ICD-10-CM | POA: Diagnosis not present

## 2011-12-01 LAB — POCT INR: INR: 2

## 2011-12-15 DIAGNOSIS — H2510 Age-related nuclear cataract, unspecified eye: Secondary | ICD-10-CM | POA: Diagnosis not present

## 2011-12-15 DIAGNOSIS — H35379 Puckering of macula, unspecified eye: Secondary | ICD-10-CM | POA: Diagnosis not present

## 2011-12-15 DIAGNOSIS — H43819 Vitreous degeneration, unspecified eye: Secondary | ICD-10-CM | POA: Diagnosis not present

## 2011-12-29 ENCOUNTER — Ambulatory Visit (INDEPENDENT_AMBULATORY_CARE_PROVIDER_SITE_OTHER): Payer: Medicare Other | Admitting: *Deleted

## 2011-12-29 ENCOUNTER — Encounter: Payer: Self-pay | Admitting: Internal Medicine

## 2011-12-29 ENCOUNTER — Ambulatory Visit (INDEPENDENT_AMBULATORY_CARE_PROVIDER_SITE_OTHER): Payer: Medicare Other | Admitting: Internal Medicine

## 2011-12-29 VITALS — BP 104/68 | Resp 18 | Ht 70.0 in | Wt 173.0 lb

## 2011-12-29 DIAGNOSIS — I4891 Unspecified atrial fibrillation: Secondary | ICD-10-CM

## 2011-12-29 LAB — BASIC METABOLIC PANEL
BUN: 20 mg/dL (ref 6–23)
CO2: 28 mEq/L (ref 19–32)
Chloride: 105 mEq/L (ref 96–112)
Creatinine, Ser: 1.2 mg/dL (ref 0.4–1.5)
Potassium: 4.2 mEq/L (ref 3.5–5.1)

## 2011-12-29 LAB — POCT INR: INR: 3.4

## 2011-12-29 MED ORDER — APIXABAN 5 MG PO TABS: 5.0000 mg | ORAL_TABLET | Freq: Two times a day (BID) | ORAL | Status: DC

## 2011-12-29 NOTE — Assessment & Plan Note (Signed)
His ventricular rate appears to be well-controlled. We discussed the treatment options with the patient. He would like to switch away from warfarin and to one of the newer agents. We will start Eliquis. I am not certain of the proper dose for this patient but we'll plan on checking his renal function. 5 mg or 2-1/2 mg twice daily would be recommended depending on his serum creatinine.

## 2011-12-29 NOTE — Patient Instructions (Addendum)
Your physician recommends that you schedule a follow-up appointment in: 6 months with Dr [name] Your physician recommends that you schedule a follow-up appointment in: 4 weeks with Weston Brass, Pharmacist for Eliquis follow up  Your physician has recommended you make the following change in your medication: START Eliquis 2.5 - 5 mg twice daily after we call  you

## 2011-12-29 NOTE — Progress Notes (Signed)
HPI [name] returns today for followup. 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 In the interim, he has done well except that he notes that times his Coumadin levels have been increased. He denies chest pain, shortness of breath, or palpitations. His activity has moderated in recent years. No syncope. Allergies  Allergen Reactions  . Sulfonamide Derivatives      Current Outpatient Prescriptions  Medication Sig Dispense Refill  . B Complex Vitamins (VITAMIN-B COMPLEX PO) 1 tab po qd       . Calcium 600-200 MG-UNIT per tablet Take 1 tablet by mouth daily.        . celecoxib (CELEBREX) 200 MG capsule Take 200 mg by mouth daily.        . Cetirizine HCl (ZYRTEC ALLERGY PO) Take 10 mg by mouth daily.        . cholecalciferol (VITAMIN D) 400 UNITS TABS Take 1,000 Units by mouth.        . cyclobenzaprine (FLEXERIL) 10 MG tablet Take 10 mg by mouth at bedtime as needed.      Marland Kitchen FINACEA 15 % cream as needed.      . finasteride (PROSCAR) 5 MG tablet Take 5 mg by mouth daily.        . fluticasone (FLONASE) 50 MCG/ACT nasal spray Place 2 sprays into the nose daily.        . Fluticasone-Salmeterol (ADVAIR DISKUS) 250-50 MCG/DOSE AEPB Inhale 1 puff into the lungs 2 (two) times daily.        . Guaifenesin-Codeine (GUAIFENESIN AC PO) Take by mouth as needed.       . montelukast (SINGULAIR) 10 MG tablet Take 10 mg by mouth at bedtime.        . NON FORMULARY allergy shots       . omeprazole (PRILOSEC) 20 MG capsule Take 20 mg by mouth daily.        . rosuvastatin (CRESTOR) 5 MG tablet Take 5 mg by mouth daily.        . temazepam (RESTORIL) 15 MG capsule Take 15 mg by mouth daily.        . verapamil (COVERA HS) 180 MG (CO) 24 hr tablet Take 180 mg by mouth at bedtime.      Marland Kitchen warfarin (COUMADIN) 5 MG tablet Take by mouth as directed.        . zolpidem (AMBIEN) 10 MG tablet Take 10 mg by mouth at bedtime as needed.        Marland Kitchen apixaban  (ELIQUIS) 5 MG TABS tablet Take 1 tablet (5 mg total) by mouth 2 (two) times daily.  60 tablet  6  . DISCONTD: beclomethasone (QVAR) 80 MCG/ACT inhaler Inhale 2 puffs into the lungs 2 (two) times daily.         Past Medical History  Diagnosis Date  . Bradycardia   . Palpitations   . Asthma   . Hemorrhoids   . Hx of adenomatous colonic polyps   . Atrial fibrillation   . Hypertension   . Pulmonary aspergillosis   . Diverticulosis of colon   . COPD (chronic obstructive pulmonary disease)     mild    ROS:   All systems reviewed and negative except as noted in the HPI.   Past Surgical History  Procedure Date  . Colon resection   . Total hip arthroplasty   . Appendectomy   . Laminectomy     Right L4-L5  Laminectomy  . Cystoscopy   . Rotator cuff repair   . Umbilical hernia repair      Family History  Problem Relation Age of Onset  . Lung cancer Father 61  . Stroke Mother 80  . Leukemia Brother   . Coronary artery disease Brother   . Heart failure Brother      History   Social History  . Marital Status: Married    Spouse Name: N/A    Number of Children: N/A  . Years of Education: N/A   Occupational History  . retired physician    Social History Main Topics  . Smoking status: Former Smoker    Quit date: 03/29/1956  . Smokeless tobacco: Never Used  . Alcohol Use: No     Quit drinking EtOH in 2012.  Used to drink on weekends 2-3 bottles of wine.  . Drug Use: No  . Sexually Active: Not on file   Other Topics Concern  . Not on file   Social History Narrative  . No narrative on file     BP 104/68  Resp 18  Ht 5\' 10"  (1.778 m)  Wt 173 lb (78.472 kg)  BMI 24.82 kg/m2  Physical Exam:  Well appearing elderly man, NAD HEENT: Unremarkable Neck:  No JVD, no thyromegally Lungs:  Clear with no wheezes, rales, or rhonchi. HEART:  IRegular rate rhythm, no murmurs, no rubs, no clicks Abd:  soft, positive bowel sounds, no organomegally, no rebound, no  guarding Ext:  2 plus pulses, no edema, no cyanosis, no clubbing Skin:  No rashes no nodules Neuro:  CN II through XII intact, motor grossly intact  EKG Atrial fibrillation with a controlled ventricular response. Poor R-wave progression.  Assess/Plan:

## 2012-01-03 ENCOUNTER — Ambulatory Visit (INDEPENDENT_AMBULATORY_CARE_PROVIDER_SITE_OTHER): Payer: Medicare Other | Admitting: *Deleted

## 2012-01-03 DIAGNOSIS — I4891 Unspecified atrial fibrillation: Secondary | ICD-10-CM

## 2012-01-03 LAB — POCT INR: INR: 1.4

## 2012-01-04 ENCOUNTER — Ambulatory Visit (INDEPENDENT_AMBULATORY_CARE_PROVIDER_SITE_OTHER): Payer: Medicare Other | Admitting: Gastroenterology

## 2012-01-04 ENCOUNTER — Encounter: Payer: Self-pay | Admitting: Gastroenterology

## 2012-01-04 VITALS — BP 100/70 | HR 78 | Ht 70.0 in | Wt 172.0 lb

## 2012-01-04 DIAGNOSIS — Z86010 Personal history of colonic polyps: Secondary | ICD-10-CM

## 2012-01-04 DIAGNOSIS — M999 Biomechanical lesion, unspecified: Secondary | ICD-10-CM | POA: Diagnosis not present

## 2012-01-04 DIAGNOSIS — M5137 Other intervertebral disc degeneration, lumbosacral region: Secondary | ICD-10-CM | POA: Diagnosis not present

## 2012-01-04 DIAGNOSIS — Z7901 Long term (current) use of anticoagulants: Secondary | ICD-10-CM | POA: Diagnosis not present

## 2012-01-04 DIAGNOSIS — M25559 Pain in unspecified hip: Secondary | ICD-10-CM | POA: Diagnosis not present

## 2012-01-04 NOTE — Progress Notes (Signed)
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 Adenomatous colon polyps were originally diagnosed in 1998. His last colonoscopy in 2008 had small hyperplastic colon polyps. He has been on anticoagulation and recently changed from warfarin to apixaban. He had a prior left hemicolectomy. He has no ongoing gastrointestinal complaints except for occasional looser stools related to certain foods. Since starting verapamil this problem has improved. Denies weight loss, abdominal pain, constipation, diarrhea, change in stool caliber, melena, hematochezia, nausea, vomiting, dysphagia, reflux symptoms, chest pain.  Review of Systems: Pertinent positive and negative review of systems were noted in the above HPI section. All other review of systems were otherwise negative.  Current Medications, Allergies, Past Medical History, Past Surgical History, Family History and Social History were reviewed in [employer] record.  Physical Exam: General: Well developed , well nourished, no acute distress Head: Normocephalic and atraumatic Eyes:  sclerae anicteric, EOMI Ears: Normal auditory acuity Mouth: No deformity or lesions Neck: Supple, no masses or thyromegaly Lungs: Clear throughout to auscultation Heart: Regular rate and rhythm; no murmurs, rubs or bruits Abdomen: Soft, non tender and non distended. No masses, hepatosplenomegaly or hernias noted. Normal Bowel sounds Musculoskeletal: Symmetrical with no gross deformities  Skin: No lesions on visible extremities Pulses:  Normal pulses noted Extremities: No clubbing, cyanosis, edema or deformities noted Neurological: Alert oriented x 4, grossly nonfocal Cervical Nodes:  No significant cervical adenopathy Inguinal Nodes: No significant inguinal adenopathy Psychological:  Alert and cooperative. Normal mood and affect  Assessment and Recommendations:  1.  Personal history of adenomatous colon polyps, initial diagnosis in 1998. Last colonoscopy in 2008 did not show any adenomatous polyps. Given his age over 34 and his need for anticoagulation he has decided against undergoing a routine surveillance colonoscopy. I will see him back as needed.  2. Diverticulosis. Long-term high fiber diet with adequate daily water intake.

## 2012-01-04 NOTE — Patient Instructions (Signed)
cc: Kari Baars, MD

## 2012-01-06 DIAGNOSIS — M25559 Pain in unspecified hip: Secondary | ICD-10-CM | POA: Diagnosis not present

## 2012-01-06 DIAGNOSIS — Z961 Presence of intraocular lens: Secondary | ICD-10-CM | POA: Diagnosis not present

## 2012-01-06 DIAGNOSIS — H35039 Hypertensive retinopathy, unspecified eye: Secondary | ICD-10-CM | POA: Diagnosis not present

## 2012-01-06 DIAGNOSIS — H2510 Age-related nuclear cataract, unspecified eye: Secondary | ICD-10-CM | POA: Diagnosis not present

## 2012-01-06 DIAGNOSIS — M5137 Other intervertebral disc degeneration, lumbosacral region: Secondary | ICD-10-CM | POA: Diagnosis not present

## 2012-01-06 DIAGNOSIS — H35379 Puckering of macula, unspecified eye: Secondary | ICD-10-CM | POA: Diagnosis not present

## 2012-01-06 DIAGNOSIS — M999 Biomechanical lesion, unspecified: Secondary | ICD-10-CM | POA: Diagnosis not present

## 2012-01-06 DIAGNOSIS — H40019 Open angle with borderline findings, low risk, unspecified eye: Secondary | ICD-10-CM | POA: Diagnosis not present

## 2012-01-07 DIAGNOSIS — M25559 Pain in unspecified hip: Secondary | ICD-10-CM | POA: Diagnosis not present

## 2012-01-07 DIAGNOSIS — M999 Biomechanical lesion, unspecified: Secondary | ICD-10-CM | POA: Diagnosis not present

## 2012-01-07 DIAGNOSIS — M5137 Other intervertebral disc degeneration, lumbosacral region: Secondary | ICD-10-CM | POA: Diagnosis not present

## 2012-01-10 DIAGNOSIS — M999 Biomechanical lesion, unspecified: Secondary | ICD-10-CM | POA: Diagnosis not present

## 2012-01-10 DIAGNOSIS — M25559 Pain in unspecified hip: Secondary | ICD-10-CM | POA: Diagnosis not present

## 2012-01-10 DIAGNOSIS — M5137 Other intervertebral disc degeneration, lumbosacral region: Secondary | ICD-10-CM | POA: Diagnosis not present

## 2012-01-11 DIAGNOSIS — M999 Biomechanical lesion, unspecified: Secondary | ICD-10-CM | POA: Diagnosis not present

## 2012-01-11 DIAGNOSIS — M25559 Pain in unspecified hip: Secondary | ICD-10-CM | POA: Diagnosis not present

## 2012-01-11 DIAGNOSIS — M5137 Other intervertebral disc degeneration, lumbosacral region: Secondary | ICD-10-CM | POA: Diagnosis not present

## 2012-01-11 DIAGNOSIS — Z23 Encounter for immunization: Secondary | ICD-10-CM | POA: Diagnosis not present

## 2012-01-13 DIAGNOSIS — M5137 Other intervertebral disc degeneration, lumbosacral region: Secondary | ICD-10-CM | POA: Diagnosis not present

## 2012-01-13 DIAGNOSIS — M25559 Pain in unspecified hip: Secondary | ICD-10-CM | POA: Diagnosis not present

## 2012-01-13 DIAGNOSIS — M999 Biomechanical lesion, unspecified: Secondary | ICD-10-CM | POA: Diagnosis not present

## 2012-01-17 DIAGNOSIS — M999 Biomechanical lesion, unspecified: Secondary | ICD-10-CM | POA: Diagnosis not present

## 2012-01-17 DIAGNOSIS — M25559 Pain in unspecified hip: Secondary | ICD-10-CM | POA: Diagnosis not present

## 2012-01-17 DIAGNOSIS — M5137 Other intervertebral disc degeneration, lumbosacral region: Secondary | ICD-10-CM | POA: Diagnosis not present

## 2012-01-19 DIAGNOSIS — M5137 Other intervertebral disc degeneration, lumbosacral region: Secondary | ICD-10-CM | POA: Diagnosis not present

## 2012-01-19 DIAGNOSIS — M25559 Pain in unspecified hip: Secondary | ICD-10-CM | POA: Diagnosis not present

## 2012-01-19 DIAGNOSIS — M999 Biomechanical lesion, unspecified: Secondary | ICD-10-CM | POA: Diagnosis not present

## 2012-01-20 DIAGNOSIS — M25559 Pain in unspecified hip: Secondary | ICD-10-CM | POA: Diagnosis not present

## 2012-01-20 DIAGNOSIS — M999 Biomechanical lesion, unspecified: Secondary | ICD-10-CM | POA: Diagnosis not present

## 2012-01-20 DIAGNOSIS — M5137 Other intervertebral disc degeneration, lumbosacral region: Secondary | ICD-10-CM | POA: Diagnosis not present

## 2012-01-20 NOTE — Addendum Note (Signed)
Addended by: Marrion Coy L on: 01/20/2012 12:07 PM   Modules accepted: Orders

## 2012-01-24 DIAGNOSIS — M999 Biomechanical lesion, unspecified: Secondary | ICD-10-CM | POA: Diagnosis not present

## 2012-01-24 DIAGNOSIS — M5137 Other intervertebral disc degeneration, lumbosacral region: Secondary | ICD-10-CM | POA: Diagnosis not present

## 2012-01-24 DIAGNOSIS — M25559 Pain in unspecified hip: Secondary | ICD-10-CM | POA: Diagnosis not present

## 2012-01-25 DIAGNOSIS — M999 Biomechanical lesion, unspecified: Secondary | ICD-10-CM | POA: Diagnosis not present

## 2012-01-25 DIAGNOSIS — M5137 Other intervertebral disc degeneration, lumbosacral region: Secondary | ICD-10-CM | POA: Diagnosis not present

## 2012-01-25 DIAGNOSIS — M25559 Pain in unspecified hip: Secondary | ICD-10-CM | POA: Diagnosis not present

## 2012-01-27 DIAGNOSIS — M999 Biomechanical lesion, unspecified: Secondary | ICD-10-CM | POA: Diagnosis not present

## 2012-01-27 DIAGNOSIS — M5137 Other intervertebral disc degeneration, lumbosacral region: Secondary | ICD-10-CM | POA: Diagnosis not present

## 2012-01-27 DIAGNOSIS — M25559 Pain in unspecified hip: Secondary | ICD-10-CM | POA: Diagnosis not present

## 2012-01-31 DIAGNOSIS — M999 Biomechanical lesion, unspecified: Secondary | ICD-10-CM | POA: Diagnosis not present

## 2012-01-31 DIAGNOSIS — M5137 Other intervertebral disc degeneration, lumbosacral region: Secondary | ICD-10-CM | POA: Diagnosis not present

## 2012-01-31 DIAGNOSIS — M25559 Pain in unspecified hip: Secondary | ICD-10-CM | POA: Diagnosis not present

## 2012-02-03 DIAGNOSIS — M5137 Other intervertebral disc degeneration, lumbosacral region: Secondary | ICD-10-CM | POA: Diagnosis not present

## 2012-02-03 DIAGNOSIS — M999 Biomechanical lesion, unspecified: Secondary | ICD-10-CM | POA: Diagnosis not present

## 2012-02-03 DIAGNOSIS — M25559 Pain in unspecified hip: Secondary | ICD-10-CM | POA: Diagnosis not present

## 2012-02-17 DIAGNOSIS — M999 Biomechanical lesion, unspecified: Secondary | ICD-10-CM | POA: Diagnosis not present

## 2012-02-17 DIAGNOSIS — M25559 Pain in unspecified hip: Secondary | ICD-10-CM | POA: Diagnosis not present

## 2012-02-17 DIAGNOSIS — M5137 Other intervertebral disc degeneration, lumbosacral region: Secondary | ICD-10-CM | POA: Diagnosis not present

## 2012-03-14 DIAGNOSIS — J45909 Unspecified asthma, uncomplicated: Secondary | ICD-10-CM | POA: Diagnosis not present

## 2012-03-14 DIAGNOSIS — J3089 Other allergic rhinitis: Secondary | ICD-10-CM | POA: Diagnosis not present

## 2012-03-14 DIAGNOSIS — J301 Allergic rhinitis due to pollen: Secondary | ICD-10-CM | POA: Diagnosis not present

## 2012-03-31 DIAGNOSIS — J309 Allergic rhinitis, unspecified: Secondary | ICD-10-CM | POA: Diagnosis not present

## 2012-04-14 DIAGNOSIS — G609 Hereditary and idiopathic neuropathy, unspecified: Secondary | ICD-10-CM | POA: Diagnosis not present

## 2012-04-14 DIAGNOSIS — E785 Hyperlipidemia, unspecified: Secondary | ICD-10-CM | POA: Diagnosis not present

## 2012-04-14 DIAGNOSIS — Z87898 Personal history of other specified conditions: Secondary | ICD-10-CM | POA: Diagnosis not present

## 2012-04-14 DIAGNOSIS — I10 Essential (primary) hypertension: Secondary | ICD-10-CM | POA: Diagnosis not present

## 2012-04-14 DIAGNOSIS — R7301 Impaired fasting glucose: Secondary | ICD-10-CM | POA: Diagnosis not present

## 2012-04-14 DIAGNOSIS — Z125 Encounter for screening for malignant neoplasm of prostate: Secondary | ICD-10-CM | POA: Diagnosis not present

## 2012-04-14 DIAGNOSIS — Z0000 Encounter for general adult medical examination without abnormal findings: Secondary | ICD-10-CM | POA: Diagnosis not present

## 2012-04-20 DIAGNOSIS — Z0000 Encounter for general adult medical examination without abnormal findings: Secondary | ICD-10-CM | POA: Diagnosis not present

## 2012-04-20 DIAGNOSIS — I4891 Unspecified atrial fibrillation: Secondary | ICD-10-CM | POA: Diagnosis not present

## 2012-04-20 DIAGNOSIS — Z1331 Encounter for screening for depression: Secondary | ICD-10-CM | POA: Diagnosis not present

## 2012-04-24 DIAGNOSIS — Z1212 Encounter for screening for malignant neoplasm of rectum: Secondary | ICD-10-CM | POA: Diagnosis not present

## 2012-04-28 DIAGNOSIS — L819 Disorder of pigmentation, unspecified: Secondary | ICD-10-CM | POA: Diagnosis not present

## 2012-04-28 DIAGNOSIS — D235 Other benign neoplasm of skin of trunk: Secondary | ICD-10-CM | POA: Diagnosis not present

## 2012-04-28 DIAGNOSIS — L570 Actinic keratosis: Secondary | ICD-10-CM | POA: Diagnosis not present

## 2012-04-28 DIAGNOSIS — L578 Other skin changes due to chronic exposure to nonionizing radiation: Secondary | ICD-10-CM | POA: Diagnosis not present

## 2012-04-28 DIAGNOSIS — L821 Other seborrheic keratosis: Secondary | ICD-10-CM | POA: Diagnosis not present

## 2012-05-13 ENCOUNTER — Other Ambulatory Visit: Payer: Self-pay

## 2012-05-22 ENCOUNTER — Other Ambulatory Visit: Payer: Self-pay | Admitting: Cardiology

## 2012-05-22 MED ORDER — VERAPAMIL HCL 180 MG (CO) PO TB24: 180.0000 mg | ORAL_TABLET | Freq: Every day | ORAL | Status: DC

## 2012-06-14 DIAGNOSIS — M48061 Spinal stenosis, lumbar region without neurogenic claudication: Secondary | ICD-10-CM | POA: Diagnosis not present

## 2012-06-19 DIAGNOSIS — G603 Idiopathic progressive neuropathy: Secondary | ICD-10-CM | POA: Diagnosis not present

## 2012-06-19 DIAGNOSIS — M48061 Spinal stenosis, lumbar region without neurogenic claudication: Secondary | ICD-10-CM | POA: Diagnosis not present

## 2012-06-19 DIAGNOSIS — M5137 Other intervertebral disc degeneration, lumbosacral region: Secondary | ICD-10-CM | POA: Diagnosis not present

## 2012-06-19 DIAGNOSIS — IMO0002 Reserved for concepts with insufficient information to code with codable children: Secondary | ICD-10-CM | POA: Diagnosis not present

## 2012-06-26 ENCOUNTER — Other Ambulatory Visit: Payer: Self-pay | Admitting: *Deleted

## 2012-06-26 MED ORDER — APIXABAN 5 MG PO TABS: 5.0000 mg | ORAL_TABLET | Freq: Two times a day (BID) | ORAL | Status: DC

## 2012-06-27 DIAGNOSIS — IMO0002 Reserved for concepts with insufficient information to code with codable children: Secondary | ICD-10-CM | POA: Diagnosis not present

## 2012-06-27 DIAGNOSIS — G603 Idiopathic progressive neuropathy: Secondary | ICD-10-CM | POA: Diagnosis not present

## 2012-06-27 DIAGNOSIS — M5137 Other intervertebral disc degeneration, lumbosacral region: Secondary | ICD-10-CM | POA: Diagnosis not present

## 2012-06-27 DIAGNOSIS — M48061 Spinal stenosis, lumbar region without neurogenic claudication: Secondary | ICD-10-CM | POA: Diagnosis not present

## 2012-06-29 DIAGNOSIS — IMO0002 Reserved for concepts with insufficient information to code with codable children: Secondary | ICD-10-CM | POA: Diagnosis not present

## 2012-06-29 DIAGNOSIS — M5137 Other intervertebral disc degeneration, lumbosacral region: Secondary | ICD-10-CM | POA: Diagnosis not present

## 2012-06-29 DIAGNOSIS — G603 Idiopathic progressive neuropathy: Secondary | ICD-10-CM | POA: Diagnosis not present

## 2012-06-29 DIAGNOSIS — M48061 Spinal stenosis, lumbar region without neurogenic claudication: Secondary | ICD-10-CM | POA: Diagnosis not present

## 2012-07-04 DIAGNOSIS — M48061 Spinal stenosis, lumbar region without neurogenic claudication: Secondary | ICD-10-CM | POA: Diagnosis not present

## 2012-07-04 DIAGNOSIS — M5137 Other intervertebral disc degeneration, lumbosacral region: Secondary | ICD-10-CM | POA: Diagnosis not present

## 2012-07-04 DIAGNOSIS — IMO0002 Reserved for concepts with insufficient information to code with codable children: Secondary | ICD-10-CM | POA: Diagnosis not present

## 2012-07-04 DIAGNOSIS — G603 Idiopathic progressive neuropathy: Secondary | ICD-10-CM | POA: Diagnosis not present

## 2012-07-11 DIAGNOSIS — M48061 Spinal stenosis, lumbar region without neurogenic claudication: Secondary | ICD-10-CM | POA: Diagnosis not present

## 2012-07-11 DIAGNOSIS — M5137 Other intervertebral disc degeneration, lumbosacral region: Secondary | ICD-10-CM | POA: Diagnosis not present

## 2012-07-11 DIAGNOSIS — IMO0002 Reserved for concepts with insufficient information to code with codable children: Secondary | ICD-10-CM | POA: Diagnosis not present

## 2012-07-11 DIAGNOSIS — G603 Idiopathic progressive neuropathy: Secondary | ICD-10-CM | POA: Diagnosis not present

## 2012-07-14 DIAGNOSIS — H43819 Vitreous degeneration, unspecified eye: Secondary | ICD-10-CM | POA: Diagnosis not present

## 2012-07-14 DIAGNOSIS — H04129 Dry eye syndrome of unspecified lacrimal gland: Secondary | ICD-10-CM | POA: Diagnosis not present

## 2012-07-14 DIAGNOSIS — H43399 Other vitreous opacities, unspecified eye: Secondary | ICD-10-CM | POA: Diagnosis not present

## 2012-07-14 DIAGNOSIS — H2510 Age-related nuclear cataract, unspecified eye: Secondary | ICD-10-CM | POA: Diagnosis not present

## 2012-07-14 DIAGNOSIS — H35039 Hypertensive retinopathy, unspecified eye: Secondary | ICD-10-CM | POA: Diagnosis not present

## 2012-07-14 DIAGNOSIS — H40019 Open angle with borderline findings, low risk, unspecified eye: Secondary | ICD-10-CM | POA: Diagnosis not present

## 2012-07-14 DIAGNOSIS — H04219 Epiphora due to excess lacrimation, unspecified lacrimal gland: Secondary | ICD-10-CM | POA: Diagnosis not present

## 2012-07-25 DIAGNOSIS — IMO0002 Reserved for concepts with insufficient information to code with codable children: Secondary | ICD-10-CM | POA: Diagnosis not present

## 2012-07-25 DIAGNOSIS — M48061 Spinal stenosis, lumbar region without neurogenic claudication: Secondary | ICD-10-CM | POA: Diagnosis not present

## 2012-07-25 DIAGNOSIS — G603 Idiopathic progressive neuropathy: Secondary | ICD-10-CM | POA: Diagnosis not present

## 2012-07-25 DIAGNOSIS — M5137 Other intervertebral disc degeneration, lumbosacral region: Secondary | ICD-10-CM | POA: Diagnosis not present

## 2012-07-27 DIAGNOSIS — IMO0002 Reserved for concepts with insufficient information to code with codable children: Secondary | ICD-10-CM | POA: Diagnosis not present

## 2012-07-27 DIAGNOSIS — G603 Idiopathic progressive neuropathy: Secondary | ICD-10-CM | POA: Diagnosis not present

## 2012-07-27 DIAGNOSIS — M5137 Other intervertebral disc degeneration, lumbosacral region: Secondary | ICD-10-CM | POA: Diagnosis not present

## 2012-07-27 DIAGNOSIS — M48061 Spinal stenosis, lumbar region without neurogenic claudication: Secondary | ICD-10-CM | POA: Diagnosis not present

## 2012-08-01 DIAGNOSIS — M5137 Other intervertebral disc degeneration, lumbosacral region: Secondary | ICD-10-CM | POA: Diagnosis not present

## 2012-08-01 DIAGNOSIS — IMO0002 Reserved for concepts with insufficient information to code with codable children: Secondary | ICD-10-CM | POA: Diagnosis not present

## 2012-08-01 DIAGNOSIS — G603 Idiopathic progressive neuropathy: Secondary | ICD-10-CM | POA: Diagnosis not present

## 2012-08-01 DIAGNOSIS — M48061 Spinal stenosis, lumbar region without neurogenic claudication: Secondary | ICD-10-CM | POA: Diagnosis not present

## 2012-08-08 DIAGNOSIS — M216X9 Other acquired deformities of unspecified foot: Secondary | ICD-10-CM | POA: Diagnosis not present

## 2012-08-08 DIAGNOSIS — L608 Other nail disorders: Secondary | ICD-10-CM | POA: Diagnosis not present

## 2012-08-08 DIAGNOSIS — Q6689 Other  specified congenital deformities of feet: Secondary | ICD-10-CM | POA: Diagnosis not present

## 2012-08-08 DIAGNOSIS — M2040 Other hammer toe(s) (acquired), unspecified foot: Secondary | ICD-10-CM | POA: Diagnosis not present

## 2012-08-14 ENCOUNTER — Telehealth: Payer: Self-pay | Admitting: Internal Medicine

## 2012-08-14 NOTE — Telephone Encounter (Signed)
New Prob     Pt is having some bleeding issues and is concerned. Would like to speak to nurse regarding this.

## 2012-08-14 NOTE — Telephone Encounter (Signed)
New Prob      Pt is currently taking ELIQUIS and last night pt had spontaneous bleeding from nose. Would like to speak to nurse regarding changing the dosage.

## 2012-08-14 NOTE — Telephone Encounter (Signed)
Telephoned pt and spoke with him about his bleeding issues. He states last night while out to dinner he had a nose bleed and was concerned bout he needed to cut his Eliquis dose down. He states he has not had any further bleeding since that episode. I also informed him that we do need to do his Eliquis follow up that was scheduled for April so we can check labs to see if dosing is still appropriate. Appt made for Wednesday.

## 2012-08-16 ENCOUNTER — Ambulatory Visit (INDEPENDENT_AMBULATORY_CARE_PROVIDER_SITE_OTHER): Payer: Medicare Other | Admitting: *Deleted

## 2012-08-16 DIAGNOSIS — I4891 Unspecified atrial fibrillation: Secondary | ICD-10-CM | POA: Diagnosis not present

## 2012-08-16 LAB — CBC WITH DIFFERENTIAL/PLATELET
Basophils Absolute: 0 10*3/uL (ref 0.0–0.1)
Eosinophils Absolute: 0.2 10*3/uL (ref 0.0–0.7)
HCT: 37 % — ABNORMAL LOW (ref 39.0–52.0)
Hemoglobin: 12.7 g/dL — ABNORMAL LOW (ref 13.0–17.0)
Lymphocytes Relative: 25.5 % (ref 12.0–46.0)
Lymphs Abs: 1.9 10*3/uL (ref 0.7–4.0)
MCHC: 34.3 g/dL (ref 30.0–36.0)
MCV: 90.6 fl (ref 78.0–100.0)
Monocytes Absolute: 0.9 10*3/uL (ref 0.1–1.0)
Neutro Abs: 4.3 10*3/uL (ref 1.4–7.7)
RDW: 14.4 % (ref 11.5–14.6)

## 2012-08-16 LAB — BASIC METABOLIC PANEL
BUN: 17 mg/dL (ref 6–23)
Calcium: 9.2 mg/dL (ref 8.4–10.5)
Creatinine, Ser: 1.2 mg/dL (ref 0.4–1.5)
GFR: 64.1 mL/min (ref >60.00)

## 2012-08-16 NOTE — Progress Notes (Signed)
Patient states increased amount brusing arms, bruised around ankles from tight socks, some mild gum bleeds, has had 1 nose bleed  5/18 pretty moderate, very unusual for pt to have a nose bleed, generally feels the same. Dr [name] is prescribing MD.  Will notify MD. Labs cbc and bmet drawn today. Does he stop agent, change his dose or does he need to change agents  Pt was started on Eliquis for AF on Oct 2013  Reviewed patients medication list.  Pt currently on any combined P-gp and strong CYP3A4 inhibitors/inducers (ketoconazole, traconazole, ritonavir, carbamazepine, phenytoin, rifampin, St. John's wort).  Reviewed labs.  SCr 1.2  Weight 78.01, CrCl- 49.65.  Dose appropriate based on CrCl yes (5 mg bid)   Hgb and HCT 12.7, 37.0  A full discussion of the nature of anticoagulants has been carried out.  A benefit/risk analysis has been presented to the patient, so that they understand the justification for choosing anticoagulation with Eliquis at this time.  The need for compliance is stressed.  Pt is aware to take the medication twice daily.  Side effects of potential bleeding are discussed, including unusual colored urine or stools, coughing up blood or coffee ground emesis, nose bleeds or serious fall or head trauma.  Discussed signs and symptoms of stroke. The patient should avoid any OTC items containing aspirin or ibuprofen.  Avoid alcohol consumption.   Call if any signs of abnormal bleeding.  Discussed financial obligations and resolved any difficulty in obtaining medication.  Next lab test test in 6 months.   4:30 pm called lab results to patient

## 2012-08-18 ENCOUNTER — Telehealth: Payer: Self-pay | Admitting: *Deleted

## 2012-08-18 NOTE — Telephone Encounter (Signed)
Message copied by Carmela Hurt on Fri Aug 18, 2012  8:01 AM ------      Message from: Deliah Boston      Created: Wed Aug 16, 2012  5:58 PM      Regarding: RE: recent changes with pt on eliquis for 60 days       Dr [name] says to decrease his Eliquis 2.5mg  bid   Thanks       Tresa Endo      ----- Message -----         From: Carmela Hurt, RN         Sent: 08/16/2012   1:43 PM           To: Deliah Boston, RN, Marinus Maw, MD      Subject: recent changes with pt on eliquis for 60 days            Patient states increased amount brusing arms, bruised around ankles from tight socks, some mild gum bleeds, has had 1 nose bleed  5/18 moderate, very unusual for pt to have a nose bleed, generally feels the same. Dr [name] is prescribing MD.                   Does he stop agent, change his dose or does he need to change agents       ------

## 2012-08-18 NOTE — Telephone Encounter (Signed)
Called patient and gave him information regarding decreasing dose of eliquis from 5 mg to 2.5 mg, pt will comply.

## 2012-08-29 DIAGNOSIS — J309 Allergic rhinitis, unspecified: Secondary | ICD-10-CM | POA: Diagnosis not present

## 2012-09-13 DIAGNOSIS — J45909 Unspecified asthma, uncomplicated: Secondary | ICD-10-CM | POA: Diagnosis not present

## 2012-09-13 DIAGNOSIS — J3089 Other allergic rhinitis: Secondary | ICD-10-CM | POA: Diagnosis not present

## 2012-09-13 DIAGNOSIS — J309 Allergic rhinitis, unspecified: Secondary | ICD-10-CM | POA: Diagnosis not present

## 2012-09-13 DIAGNOSIS — J301 Allergic rhinitis due to pollen: Secondary | ICD-10-CM | POA: Diagnosis not present

## 2012-09-19 DIAGNOSIS — L819 Disorder of pigmentation, unspecified: Secondary | ICD-10-CM | POA: Diagnosis not present

## 2012-09-19 DIAGNOSIS — J309 Allergic rhinitis, unspecified: Secondary | ICD-10-CM | POA: Diagnosis not present

## 2012-09-19 DIAGNOSIS — L719 Rosacea, unspecified: Secondary | ICD-10-CM | POA: Diagnosis not present

## 2012-09-19 DIAGNOSIS — L578 Other skin changes due to chronic exposure to nonionizing radiation: Secondary | ICD-10-CM | POA: Diagnosis not present

## 2012-09-19 DIAGNOSIS — Z85828 Personal history of other malignant neoplasm of skin: Secondary | ICD-10-CM | POA: Diagnosis not present

## 2012-09-19 DIAGNOSIS — L821 Other seborrheic keratosis: Secondary | ICD-10-CM | POA: Diagnosis not present

## 2012-09-22 DIAGNOSIS — J309 Allergic rhinitis, unspecified: Secondary | ICD-10-CM | POA: Diagnosis not present

## 2012-09-26 DIAGNOSIS — J309 Allergic rhinitis, unspecified: Secondary | ICD-10-CM | POA: Diagnosis not present

## 2012-09-28 DIAGNOSIS — J309 Allergic rhinitis, unspecified: Secondary | ICD-10-CM | POA: Diagnosis not present

## 2012-10-03 DIAGNOSIS — J309 Allergic rhinitis, unspecified: Secondary | ICD-10-CM | POA: Diagnosis not present

## 2012-10-10 DIAGNOSIS — J309 Allergic rhinitis, unspecified: Secondary | ICD-10-CM | POA: Diagnosis not present

## 2012-10-17 DIAGNOSIS — J309 Allergic rhinitis, unspecified: Secondary | ICD-10-CM | POA: Diagnosis not present

## 2012-10-23 ENCOUNTER — Other Ambulatory Visit: Payer: Self-pay | Admitting: *Deleted

## 2012-10-24 ENCOUNTER — Other Ambulatory Visit: Payer: Self-pay | Admitting: *Deleted

## 2012-10-24 DIAGNOSIS — J309 Allergic rhinitis, unspecified: Secondary | ICD-10-CM | POA: Diagnosis not present

## 2012-10-24 MED ORDER — APIXABAN 5 MG PO TABS: ORAL_TABLET | ORAL | Status: DC

## 2012-10-24 NOTE — Telephone Encounter (Signed)
Ok per kelly to refill.

## 2012-11-01 ENCOUNTER — Other Ambulatory Visit: Payer: Self-pay

## 2012-11-07 DIAGNOSIS — L608 Other nail disorders: Secondary | ICD-10-CM | POA: Diagnosis not present

## 2012-11-07 DIAGNOSIS — J309 Allergic rhinitis, unspecified: Secondary | ICD-10-CM | POA: Diagnosis not present

## 2012-11-14 DIAGNOSIS — J309 Allergic rhinitis, unspecified: Secondary | ICD-10-CM | POA: Diagnosis not present

## 2012-11-15 DIAGNOSIS — H903 Sensorineural hearing loss, bilateral: Secondary | ICD-10-CM | POA: Diagnosis not present

## 2012-11-21 DIAGNOSIS — J309 Allergic rhinitis, unspecified: Secondary | ICD-10-CM | POA: Diagnosis not present

## 2012-11-22 ENCOUNTER — Ambulatory Visit (INDEPENDENT_AMBULATORY_CARE_PROVIDER_SITE_OTHER): Payer: Medicare Other | Admitting: Internal Medicine

## 2012-11-22 ENCOUNTER — Encounter: Payer: Self-pay | Admitting: Internal Medicine

## 2012-11-22 VITALS — BP 115/85 | HR 70 | Ht 70.5 in | Wt 172.2 lb

## 2012-11-22 DIAGNOSIS — I4891 Unspecified atrial fibrillation: Secondary | ICD-10-CM | POA: Diagnosis not present

## 2012-11-22 DIAGNOSIS — Z7901 Long term (current) use of anticoagulants: Secondary | ICD-10-CM

## 2012-11-22 MED ORDER — APIXABAN 5 MG PO TABS: 5.0000 mg | ORAL_TABLET | Freq: Two times a day (BID) | ORAL | Status: DC

## 2012-11-22 NOTE — Assessment & Plan Note (Signed)
His ventricular rate appears to be well-controlled, and he is asymptomatic. No change in medical therapy at this time. His rate appears to be well-controlled on calcium channel blockers.

## 2012-11-22 NOTE — Progress Notes (Signed)
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 He is a pleasant 79 yo man with a h/o atrial fibrillation, returns today for followup. We place the patient on long-term anti-coagulation with Eliquis and he notes that he had an episode of epistaxis. He decreased his dose of Eliquis to 2.5 mg twice daily. His creatinine clearance would suggest that 5 mg twice daily was more appropriate dose. In light of his epistaxis however, we have him reduce his dose. The patient is otherwise been stable. He does have some hypertension which has been well-controlled on low-dose calcium channel blockade. He denies syncope or near-syncope. Allergies  Allergen Reactions  . Sulfonamide Derivatives      Current Outpatient Prescriptions  Medication Sig Dispense Refill  . apixaban (ELIQUIS) 5 MG TABS tablet Take 1 tablet (5 mg total) by mouth 2 (two) times daily.      . B Complex Vitamins (VITAMIN-B COMPLEX PO) 1 tab po qd       . Calcium 600-200 MG-UNIT per tablet Take 1 tablet by mouth daily.        . celecoxib (CELEBREX) 200 MG capsule Take 200 mg by mouth daily.        . Cetirizine HCl (ZYRTEC ALLERGY PO) Take 10 mg by mouth daily.        . cholecalciferol (VITAMIN D) 400 UNITS TABS Take 1,000 Units by mouth.        . cyclobenzaprine (FLEXERIL) 10 MG tablet Take 10 mg by mouth at bedtime as needed.      Marland Kitchen FINACEA 15 % cream as needed.      . finasteride (PROSCAR) 5 MG tablet Take 5 mg by mouth daily.        . fluticasone (FLONASE) 50 MCG/ACT nasal spray Place 2 sprays into the nose daily.        . Fluticasone-Salmeterol (ADVAIR DISKUS) 250-50 MCG/DOSE AEPB Inhale 1 puff into the lungs 2 (two) times daily.        Marland Kitchen gabapentin (NEURONTIN) 300 MG capsule       . Guaifenesin-Codeine (GUAIFENESIN AC PO) Take by mouth as needed.       . montelukast (SINGULAIR) 10 MG tablet Take 10 mg by mouth at bedtime.        . NON FORMULARY allergy shots       . omeprazole (PRILOSEC) 20 MG capsule Take 20 mg by mouth daily.        .  rosuvastatin (CRESTOR) 5 MG tablet Take 5 mg by mouth daily.        . temazepam (RESTORIL) 15 MG capsule Take 15 mg by mouth daily.        . verapamil (COVERA HS) 180 MG (CO) 24 hr tablet Take 1 tablet (180 mg total) by mouth at bedtime.  30 tablet  5  . zolpidem (AMBIEN) 10 MG tablet Take 10 mg by mouth at bedtime as needed.        . [DISCONTINUED] beclomethasone (QVAR) 80 MCG/ACT inhaler Inhale 2 puffs into the lungs 2 (two) times daily.       No current facility-administered medications for this visit.     Past Medical History  Diagnosis Date  . Bradycardia   . Palpitations   . Asthma   . Hemorrhoids   . Hx of adenomatous colonic polyps 02/1997  . Atrial fibrillation   . Hypertension   . Pulmonary aspergillosis   . Diverticulosis of colon   . COPD (chronic obstructive pulmonary disease)     mild  .  Bowel obstruction     ROS:   All systems reviewed and negative except as noted in the HPI.   Past Surgical History  Procedure Laterality Date  . Colon resection    . Total hip arthroplasty    . Appendectomy    . Laminectomy      Right L4-L5 Laminectomy  . Cystoscopy    . Rotator cuff repair    . Umbilical hernia repair       Family History  Problem Relation Age of Onset  . Lung cancer Father 48  . Stroke Mother 67  . Leukemia Brother   . Coronary artery disease Brother   . Heart failure Brother      History   Social History  . Marital Status: Married    Spouse Name: N/A    Number of Children: N/A  . Years of Education: N/A   Occupational History  . retired physician    Social History Main Topics  . Smoking status: Former Smoker    Quit date: 03/29/1956  . Smokeless tobacco: Never Used  . Alcohol Use: No     Comment: Quit drinking EtOH in 2012.  Used to drink on weekends 2-3 bottles of wine.  . Drug Use: No  . Sexual Activity: Not on file   Other Topics Concern  . Not on file   Social History Narrative  . No narrative on file     BP 115/85   Pulse 70  Ht 5' 10.5" (1.791 m)  Wt 172 lb 3.2 oz (78.109 kg)  BMI 24.35 kg/m2  Physical Exam:  Well appearing elderly man, NAD HEENT: Unremarkable Neck:  No JVD, no thyromegally Back:  No CVA tenderness Lungs:  Clear with no wheezes, rales, or rhonchi. HEART:  Regular rate rhythm, no murmurs, no rubs, no clicks Abd:  soft, positive bowel sounds, no organomegally, no rebound, no guarding Ext:  2 plus pulses, no edema, no cyanosis, no clubbing Skin:  No rashes no nodules Neuro:  CN II through XII intact, motor grossly intact  EKG - atrial fibrillation with a controlled ventricular response  Assess/Plan:

## 2012-11-22 NOTE — Assessment & Plan Note (Signed)
He will continue his chronic anticoagulant with Eliquis. I've asked the patient to take a total of 7.5 mg daily in 3 divided doses. We have had an extensive discussion about the merits of slight increase in his dose. He had bleeding on 5 mg twice a day, making me think that this is too much for him. I am uncertain as to whether 2.5 mg twice a day is enough drug and for this reason the total of 7.5 mg daily  Seems appropriate. We'll see him back in several months.Marland Kitchen

## 2012-11-22 NOTE — Patient Instructions (Signed)
Your physician wants you to follow-up in: 6 months with Dr Taylor You will receive a reminder letter in the mail two months in advance. If you don't receive a letter, please call our office to schedule the follow-up appointment.  

## 2012-11-28 DIAGNOSIS — J309 Allergic rhinitis, unspecified: Secondary | ICD-10-CM | POA: Diagnosis not present

## 2012-12-04 DIAGNOSIS — Z23 Encounter for immunization: Secondary | ICD-10-CM | POA: Diagnosis not present

## 2012-12-04 DIAGNOSIS — IMO0002 Reserved for concepts with insufficient information to code with codable children: Secondary | ICD-10-CM | POA: Diagnosis not present

## 2012-12-04 DIAGNOSIS — I4891 Unspecified atrial fibrillation: Secondary | ICD-10-CM | POA: Diagnosis not present

## 2012-12-04 DIAGNOSIS — R7301 Impaired fasting glucose: Secondary | ICD-10-CM | POA: Diagnosis not present

## 2012-12-05 DIAGNOSIS — J309 Allergic rhinitis, unspecified: Secondary | ICD-10-CM | POA: Diagnosis not present

## 2012-12-12 DIAGNOSIS — J309 Allergic rhinitis, unspecified: Secondary | ICD-10-CM | POA: Diagnosis not present

## 2012-12-19 DIAGNOSIS — J309 Allergic rhinitis, unspecified: Secondary | ICD-10-CM | POA: Diagnosis not present

## 2012-12-26 DIAGNOSIS — J309 Allergic rhinitis, unspecified: Secondary | ICD-10-CM | POA: Diagnosis not present

## 2013-01-02 DIAGNOSIS — J309 Allergic rhinitis, unspecified: Secondary | ICD-10-CM | POA: Diagnosis not present

## 2013-01-09 DIAGNOSIS — J309 Allergic rhinitis, unspecified: Secondary | ICD-10-CM | POA: Diagnosis not present

## 2013-01-10 DIAGNOSIS — M778 Other enthesopathies, not elsewhere classified: Secondary | ICD-10-CM | POA: Diagnosis not present

## 2013-01-16 ENCOUNTER — Other Ambulatory Visit: Payer: Self-pay

## 2013-01-16 DIAGNOSIS — J309 Allergic rhinitis, unspecified: Secondary | ICD-10-CM | POA: Diagnosis not present

## 2013-01-16 MED ORDER — VERAPAMIL HCL 180 MG (CO) PO TB24: 180.0000 mg | ORAL_TABLET | Freq: Every day | ORAL | Status: DC

## 2013-01-23 DIAGNOSIS — J309 Allergic rhinitis, unspecified: Secondary | ICD-10-CM | POA: Diagnosis not present

## 2013-01-30 DIAGNOSIS — J309 Allergic rhinitis, unspecified: Secondary | ICD-10-CM | POA: Diagnosis not present

## 2013-02-01 ENCOUNTER — Other Ambulatory Visit: Payer: Self-pay

## 2013-02-06 DIAGNOSIS — J309 Allergic rhinitis, unspecified: Secondary | ICD-10-CM | POA: Diagnosis not present

## 2013-02-06 DIAGNOSIS — L608 Other nail disorders: Secondary | ICD-10-CM | POA: Diagnosis not present

## 2013-02-08 DIAGNOSIS — J309 Allergic rhinitis, unspecified: Secondary | ICD-10-CM | POA: Diagnosis not present

## 2013-02-13 DIAGNOSIS — Z85828 Personal history of other malignant neoplasm of skin: Secondary | ICD-10-CM | POA: Diagnosis not present

## 2013-02-13 DIAGNOSIS — J309 Allergic rhinitis, unspecified: Secondary | ICD-10-CM | POA: Diagnosis not present

## 2013-02-13 DIAGNOSIS — L821 Other seborrheic keratosis: Secondary | ICD-10-CM | POA: Diagnosis not present

## 2013-02-13 DIAGNOSIS — L570 Actinic keratosis: Secondary | ICD-10-CM | POA: Diagnosis not present

## 2013-02-15 DIAGNOSIS — H35039 Hypertensive retinopathy, unspecified eye: Secondary | ICD-10-CM | POA: Diagnosis not present

## 2013-02-15 DIAGNOSIS — H40019 Open angle with borderline findings, low risk, unspecified eye: Secondary | ICD-10-CM | POA: Diagnosis not present

## 2013-02-15 DIAGNOSIS — H04129 Dry eye syndrome of unspecified lacrimal gland: Secondary | ICD-10-CM | POA: Diagnosis not present

## 2013-02-15 DIAGNOSIS — H01009 Unspecified blepharitis unspecified eye, unspecified eyelid: Secondary | ICD-10-CM | POA: Diagnosis not present

## 2013-02-15 DIAGNOSIS — H43819 Vitreous degeneration, unspecified eye: Secondary | ICD-10-CM | POA: Diagnosis not present

## 2013-02-15 DIAGNOSIS — H04209 Unspecified epiphora, unspecified lacrimal gland: Secondary | ICD-10-CM | POA: Diagnosis not present

## 2013-02-15 DIAGNOSIS — H35379 Puckering of macula, unspecified eye: Secondary | ICD-10-CM | POA: Diagnosis not present

## 2013-02-20 DIAGNOSIS — J309 Allergic rhinitis, unspecified: Secondary | ICD-10-CM | POA: Diagnosis not present

## 2013-03-06 DIAGNOSIS — J309 Allergic rhinitis, unspecified: Secondary | ICD-10-CM | POA: Diagnosis not present

## 2013-03-13 DIAGNOSIS — J309 Allergic rhinitis, unspecified: Secondary | ICD-10-CM | POA: Diagnosis not present

## 2013-03-15 DIAGNOSIS — J309 Allergic rhinitis, unspecified: Secondary | ICD-10-CM | POA: Diagnosis not present

## 2013-03-19 DIAGNOSIS — M25569 Pain in unspecified knee: Secondary | ICD-10-CM | POA: Diagnosis not present

## 2013-03-20 DIAGNOSIS — J309 Allergic rhinitis, unspecified: Secondary | ICD-10-CM | POA: Diagnosis not present

## 2013-03-27 DIAGNOSIS — J309 Allergic rhinitis, unspecified: Secondary | ICD-10-CM | POA: Diagnosis not present

## 2013-03-28 DIAGNOSIS — J301 Allergic rhinitis due to pollen: Secondary | ICD-10-CM | POA: Diagnosis not present

## 2013-03-28 DIAGNOSIS — J3089 Other allergic rhinitis: Secondary | ICD-10-CM | POA: Diagnosis not present

## 2013-03-28 DIAGNOSIS — J45909 Unspecified asthma, uncomplicated: Secondary | ICD-10-CM | POA: Diagnosis not present

## 2013-04-17 DIAGNOSIS — I10 Essential (primary) hypertension: Secondary | ICD-10-CM | POA: Diagnosis not present

## 2013-04-17 DIAGNOSIS — E785 Hyperlipidemia, unspecified: Secondary | ICD-10-CM | POA: Diagnosis not present

## 2013-04-17 DIAGNOSIS — Z125 Encounter for screening for malignant neoplasm of prostate: Secondary | ICD-10-CM | POA: Diagnosis not present

## 2013-04-17 DIAGNOSIS — J309 Allergic rhinitis, unspecified: Secondary | ICD-10-CM | POA: Diagnosis not present

## 2013-04-17 DIAGNOSIS — R7301 Impaired fasting glucose: Secondary | ICD-10-CM | POA: Diagnosis not present

## 2013-04-19 DIAGNOSIS — J309 Allergic rhinitis, unspecified: Secondary | ICD-10-CM | POA: Diagnosis not present

## 2013-04-24 DIAGNOSIS — E785 Hyperlipidemia, unspecified: Secondary | ICD-10-CM | POA: Diagnosis not present

## 2013-04-24 DIAGNOSIS — Z23 Encounter for immunization: Secondary | ICD-10-CM | POA: Diagnosis not present

## 2013-04-24 DIAGNOSIS — Z87891 Personal history of nicotine dependence: Secondary | ICD-10-CM | POA: Diagnosis not present

## 2013-04-24 DIAGNOSIS — R7301 Impaired fasting glucose: Secondary | ICD-10-CM | POA: Diagnosis not present

## 2013-04-24 DIAGNOSIS — I4891 Unspecified atrial fibrillation: Secondary | ICD-10-CM | POA: Diagnosis not present

## 2013-04-24 DIAGNOSIS — Z0000 Encounter for general adult medical examination without abnormal findings: Secondary | ICD-10-CM | POA: Diagnosis not present

## 2013-04-24 DIAGNOSIS — I498 Other specified cardiac arrhythmias: Secondary | ICD-10-CM | POA: Diagnosis not present

## 2013-04-24 DIAGNOSIS — J309 Allergic rhinitis, unspecified: Secondary | ICD-10-CM | POA: Diagnosis not present

## 2013-04-24 DIAGNOSIS — J449 Chronic obstructive pulmonary disease, unspecified: Secondary | ICD-10-CM | POA: Diagnosis not present

## 2013-04-24 DIAGNOSIS — IMO0002 Reserved for concepts with insufficient information to code with codable children: Secondary | ICD-10-CM | POA: Diagnosis not present

## 2013-04-24 DIAGNOSIS — Z1331 Encounter for screening for depression: Secondary | ICD-10-CM | POA: Diagnosis not present

## 2013-04-24 DIAGNOSIS — I10 Essential (primary) hypertension: Secondary | ICD-10-CM | POA: Diagnosis not present

## 2013-04-24 DIAGNOSIS — G609 Hereditary and idiopathic neuropathy, unspecified: Secondary | ICD-10-CM | POA: Diagnosis not present

## 2013-04-27 DIAGNOSIS — J309 Allergic rhinitis, unspecified: Secondary | ICD-10-CM | POA: Diagnosis not present

## 2013-04-30 DIAGNOSIS — Z1212 Encounter for screening for malignant neoplasm of rectum: Secondary | ICD-10-CM | POA: Diagnosis not present

## 2013-05-01 DIAGNOSIS — J309 Allergic rhinitis, unspecified: Secondary | ICD-10-CM | POA: Diagnosis not present

## 2013-05-08 DIAGNOSIS — J309 Allergic rhinitis, unspecified: Secondary | ICD-10-CM | POA: Diagnosis not present

## 2013-05-22 ENCOUNTER — Ambulatory Visit: Payer: Medicare Other | Admitting: Internal Medicine

## 2013-05-29 DIAGNOSIS — J309 Allergic rhinitis, unspecified: Secondary | ICD-10-CM | POA: Diagnosis not present

## 2013-06-05 DIAGNOSIS — J309 Allergic rhinitis, unspecified: Secondary | ICD-10-CM | POA: Diagnosis not present

## 2013-06-12 DIAGNOSIS — J309 Allergic rhinitis, unspecified: Secondary | ICD-10-CM | POA: Diagnosis not present

## 2013-06-19 DIAGNOSIS — J309 Allergic rhinitis, unspecified: Secondary | ICD-10-CM | POA: Diagnosis not present

## 2013-06-19 DIAGNOSIS — M25569 Pain in unspecified knee: Secondary | ICD-10-CM | POA: Diagnosis not present

## 2013-06-26 DIAGNOSIS — J309 Allergic rhinitis, unspecified: Secondary | ICD-10-CM | POA: Diagnosis not present

## 2013-06-27 ENCOUNTER — Other Ambulatory Visit: Payer: Self-pay

## 2013-06-27 ENCOUNTER — Ambulatory Visit: Payer: Medicare Other | Admitting: Internal Medicine

## 2013-06-28 ENCOUNTER — Ambulatory Visit (INDEPENDENT_AMBULATORY_CARE_PROVIDER_SITE_OTHER): Payer: Medicare Other | Admitting: Internal Medicine

## 2013-06-28 ENCOUNTER — Encounter: Payer: Self-pay | Admitting: Internal Medicine

## 2013-06-28 VITALS — BP 128/79 | HR 67 | Ht 70.0 in | Wt 168.0 lb

## 2013-06-28 DIAGNOSIS — I4891 Unspecified atrial fibrillation: Secondary | ICD-10-CM | POA: Diagnosis not present

## 2013-06-28 DIAGNOSIS — I10 Essential (primary) hypertension: Secondary | ICD-10-CM

## 2013-06-28 NOTE — Patient Instructions (Signed)
Your physician wants you to follow-up in: 12 months with Dr. Taylor. You will receive a reminder letter in the mail two months in advance. If you don't receive a letter, please call our office to schedule the follow-up appointment.    

## 2013-06-28 NOTE — Assessment & Plan Note (Signed)
His blood pressure is well controlled. He will continue his current meds. 

## 2013-06-28 NOTE — Assessment & Plan Note (Signed)
His ventricular rate is well controlled. He will continue his current meds. 

## 2013-06-28 NOTE — Progress Notes (Signed)
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 He is a pleasant 80 yo man with a h/o atrial fibrillation, returns today for followup. We place the patient on long-term anti-coagulation with Eliquis and he notes that he had an episode of epistaxis. He decreased his dose of Eliquis to 2.5 mg twice daily. His creatinine clearance would suggest that 5 mg twice daily was more appropriate dose. In light of his epistaxis however, we have him reduce his dose. The patient has subsequently decided that he should take 2.5 mg three times a day.  The patient is otherwise been stable. He does have some hypertension which has been well-controlled on low-dose calcium channel blockade. He denies syncope or near-syncope. Allergies  Allergen Reactions  . Sulfonamide Derivatives      Current Outpatient Prescriptions  Medication Sig Dispense Refill  . apixaban (ELIQUIS) 5 MG TABS tablet Take 1 tablet (5 mg total) by mouth 2 (two) times daily.      . B Complex Vitamins (VITAMIN-B COMPLEX PO) 1 tab po qd       . Calcium 600-200 MG-UNIT per tablet Take 1 tablet by mouth daily.        . celecoxib (CELEBREX) 200 MG capsule Take 200 mg by mouth daily.        . Cetirizine HCl (ZYRTEC ALLERGY PO) Take 10 mg by mouth daily.        . cholecalciferol (VITAMIN D) 400 UNITS TABS Take 1,000 Units by mouth.        . cyclobenzaprine (FLEXERIL) 10 MG tablet Take 10 mg by mouth at bedtime as needed.      Marland Kitchen FINACEA 15 % cream as needed.      . finasteride (PROSCAR) 5 MG tablet Take 5 mg by mouth daily.        . fluticasone (FLONASE) 50 MCG/ACT nasal spray Place 2 sprays into the nose daily.        . Fluticasone-Salmeterol (ADVAIR DISKUS) 250-50 MCG/DOSE AEPB Inhale 1 puff into the lungs 2 (two) times daily.        Marland Kitchen gabapentin (NEURONTIN) 300 MG capsule       . Guaifenesin-Codeine (GUAIFENESIN AC PO) Take by mouth as needed.       . montelukast (SINGULAIR) 10 MG tablet Take 10 mg by mouth at bedtime.        . NON FORMULARY allergy shots        . omeprazole (PRILOSEC) 20 MG capsule Take 20 mg by mouth daily.        . rosuvastatin (CRESTOR) 5 MG tablet Take 5 mg by mouth daily.        . temazepam (RESTORIL) 15 MG capsule Take 15 mg by mouth daily.        . verapamil (COVERA HS) 180 MG (CO) 24 hr tablet Take 1 tablet (180 mg total) by mouth at bedtime.  30 tablet  5  . zolpidem (AMBIEN) 10 MG tablet Take 10 mg by mouth at bedtime as needed.        . [DISCONTINUED] beclomethasone (QVAR) 80 MCG/ACT inhaler Inhale 2 puffs into the lungs 2 (two) times daily.       No current facility-administered medications for this visit.     Past Medical History  Diagnosis Date  . Bradycardia   . Palpitations   . Asthma   . Hemorrhoids   . Hx of adenomatous colonic polyps 02/1997  . Atrial fibrillation   . Hypertension   . Pulmonary aspergillosis   .  Diverticulosis of colon   . COPD (chronic obstructive pulmonary disease)     mild  . Bowel obstruction     ROS:   All systems reviewed and negative except as noted in the HPI.   Past Surgical History  Procedure Laterality Date  . Colon resection    . Total hip arthroplasty    . Appendectomy    . Laminectomy      Right L4-L5 Laminectomy  . Cystoscopy    . Rotator cuff repair    . Umbilical hernia repair       Family History  Problem Relation Age of Onset  . Lung cancer Father 30  . Stroke Mother 25  . Leukemia Brother   . Coronary artery disease Brother   . Heart failure Brother      History   Social History  . Marital Status: Married    Spouse Name: N/A    Number of Children: N/A  . Years of Education: N/A   Occupational History  . retired physician    Social History Main Topics  . Smoking status: Former Smoker    Quit date: 03/29/1956  . Smokeless tobacco: Never Used  . Alcohol Use: No     Comment: Quit drinking EtOH in 2012.  Used to drink on weekends 2-3 bottles of wine.  . Drug Use: No  . Sexual Activity: Not on file   Other Topics Concern  .  Not on file   Social History Narrative  . No narrative on file     BP 128/79  Pulse 67  Ht 5\' 10"  (1.778 m)  Wt 168 lb (76.204 kg)  BMI 24.11 kg/m2  Physical Exam:  Well appearing elderly man, NAD HEENT: Unremarkable Neck:  No JVD, no thyromegally Back:  No CVA tenderness Lungs:  Clear with no wheezes, rales, or rhonchi. HEART:  IRegular rate rhythm, no murmurs, no rubs, no clicks Abd:  soft, positive bowel sounds, no organomegally, no rebound, no guarding Ext:  2 plus pulses, no edema, no cyanosis, no clubbing Skin:  No rashes no nodules Neuro:  CN II through XII intact, motor grossly intact  EKG - atrial fibrillation with a controlled ventricular response  Assess/Plan:

## 2013-07-03 DIAGNOSIS — J309 Allergic rhinitis, unspecified: Secondary | ICD-10-CM | POA: Diagnosis not present

## 2013-07-10 DIAGNOSIS — J309 Allergic rhinitis, unspecified: Secondary | ICD-10-CM | POA: Diagnosis not present

## 2013-07-17 DIAGNOSIS — J309 Allergic rhinitis, unspecified: Secondary | ICD-10-CM | POA: Diagnosis not present

## 2013-07-24 DIAGNOSIS — J309 Allergic rhinitis, unspecified: Secondary | ICD-10-CM | POA: Diagnosis not present

## 2013-07-31 DIAGNOSIS — M1710 Unilateral primary osteoarthritis, unspecified knee: Secondary | ICD-10-CM | POA: Diagnosis not present

## 2013-07-31 DIAGNOSIS — IMO0002 Reserved for concepts with insufficient information to code with codable children: Secondary | ICD-10-CM | POA: Diagnosis not present

## 2013-07-31 DIAGNOSIS — J309 Allergic rhinitis, unspecified: Secondary | ICD-10-CM | POA: Diagnosis not present

## 2013-08-07 DIAGNOSIS — M25569 Pain in unspecified knee: Secondary | ICD-10-CM | POA: Diagnosis not present

## 2013-08-14 DIAGNOSIS — J309 Allergic rhinitis, unspecified: Secondary | ICD-10-CM | POA: Diagnosis not present

## 2013-08-16 DIAGNOSIS — J309 Allergic rhinitis, unspecified: Secondary | ICD-10-CM | POA: Diagnosis not present

## 2013-08-21 DIAGNOSIS — J309 Allergic rhinitis, unspecified: Secondary | ICD-10-CM | POA: Diagnosis not present

## 2013-08-23 DIAGNOSIS — M25569 Pain in unspecified knee: Secondary | ICD-10-CM | POA: Diagnosis not present

## 2013-08-27 DIAGNOSIS — J45909 Unspecified asthma, uncomplicated: Secondary | ICD-10-CM | POA: Diagnosis not present

## 2013-08-27 DIAGNOSIS — J309 Allergic rhinitis, unspecified: Secondary | ICD-10-CM | POA: Diagnosis not present

## 2013-08-27 DIAGNOSIS — J301 Allergic rhinitis due to pollen: Secondary | ICD-10-CM | POA: Diagnosis not present

## 2013-08-27 DIAGNOSIS — J3089 Other allergic rhinitis: Secondary | ICD-10-CM | POA: Diagnosis not present

## 2013-09-04 DIAGNOSIS — J309 Allergic rhinitis, unspecified: Secondary | ICD-10-CM | POA: Diagnosis not present

## 2013-09-11 DIAGNOSIS — J309 Allergic rhinitis, unspecified: Secondary | ICD-10-CM | POA: Diagnosis not present

## 2013-09-14 DIAGNOSIS — L738 Other specified follicular disorders: Secondary | ICD-10-CM | POA: Diagnosis not present

## 2013-09-14 DIAGNOSIS — Z85828 Personal history of other malignant neoplasm of skin: Secondary | ICD-10-CM | POA: Diagnosis not present

## 2013-09-14 DIAGNOSIS — L821 Other seborrheic keratosis: Secondary | ICD-10-CM | POA: Diagnosis not present

## 2013-09-14 DIAGNOSIS — D1801 Hemangioma of skin and subcutaneous tissue: Secondary | ICD-10-CM | POA: Diagnosis not present

## 2013-09-14 DIAGNOSIS — L820 Inflamed seborrheic keratosis: Secondary | ICD-10-CM | POA: Diagnosis not present

## 2013-09-14 DIAGNOSIS — D239 Other benign neoplasm of skin, unspecified: Secondary | ICD-10-CM | POA: Diagnosis not present

## 2013-09-14 DIAGNOSIS — D219 Benign neoplasm of connective and other soft tissue, unspecified: Secondary | ICD-10-CM | POA: Diagnosis not present

## 2013-09-14 DIAGNOSIS — L570 Actinic keratosis: Secondary | ICD-10-CM | POA: Diagnosis not present

## 2013-09-18 DIAGNOSIS — J309 Allergic rhinitis, unspecified: Secondary | ICD-10-CM | POA: Diagnosis not present

## 2013-09-25 DIAGNOSIS — J309 Allergic rhinitis, unspecified: Secondary | ICD-10-CM | POA: Diagnosis not present

## 2013-09-27 DIAGNOSIS — J309 Allergic rhinitis, unspecified: Secondary | ICD-10-CM | POA: Diagnosis not present

## 2013-10-02 DIAGNOSIS — J309 Allergic rhinitis, unspecified: Secondary | ICD-10-CM | POA: Diagnosis not present

## 2013-10-04 DIAGNOSIS — J309 Allergic rhinitis, unspecified: Secondary | ICD-10-CM | POA: Diagnosis not present

## 2013-10-09 DIAGNOSIS — J309 Allergic rhinitis, unspecified: Secondary | ICD-10-CM | POA: Diagnosis not present

## 2013-10-16 DIAGNOSIS — J309 Allergic rhinitis, unspecified: Secondary | ICD-10-CM | POA: Diagnosis not present

## 2013-10-23 DIAGNOSIS — J309 Allergic rhinitis, unspecified: Secondary | ICD-10-CM | POA: Diagnosis not present

## 2013-10-24 DIAGNOSIS — R7301 Impaired fasting glucose: Secondary | ICD-10-CM | POA: Diagnosis not present

## 2013-10-24 DIAGNOSIS — J449 Chronic obstructive pulmonary disease, unspecified: Secondary | ICD-10-CM | POA: Diagnosis not present

## 2013-10-24 DIAGNOSIS — I4891 Unspecified atrial fibrillation: Secondary | ICD-10-CM | POA: Diagnosis not present

## 2013-10-24 DIAGNOSIS — IMO0002 Reserved for concepts with insufficient information to code with codable children: Secondary | ICD-10-CM | POA: Diagnosis not present

## 2013-10-24 DIAGNOSIS — I10 Essential (primary) hypertension: Secondary | ICD-10-CM | POA: Diagnosis not present

## 2013-10-24 DIAGNOSIS — S83289A Other tear of lateral meniscus, current injury, unspecified knee, initial encounter: Secondary | ICD-10-CM | POA: Diagnosis not present

## 2013-10-24 DIAGNOSIS — E785 Hyperlipidemia, unspecified: Secondary | ICD-10-CM | POA: Diagnosis not present

## 2013-11-06 DIAGNOSIS — J309 Allergic rhinitis, unspecified: Secondary | ICD-10-CM | POA: Diagnosis not present

## 2013-11-08 DIAGNOSIS — I872 Venous insufficiency (chronic) (peripheral): Secondary | ICD-10-CM | POA: Diagnosis not present

## 2013-11-08 DIAGNOSIS — L608 Other nail disorders: Secondary | ICD-10-CM | POA: Diagnosis not present

## 2013-11-08 DIAGNOSIS — M25579 Pain in unspecified ankle and joints of unspecified foot: Secondary | ICD-10-CM | POA: Diagnosis not present

## 2013-11-08 DIAGNOSIS — M7989 Other specified soft tissue disorders: Secondary | ICD-10-CM | POA: Diagnosis not present

## 2013-11-13 DIAGNOSIS — S83289A Other tear of lateral meniscus, current injury, unspecified knee, initial encounter: Secondary | ICD-10-CM | POA: Diagnosis not present

## 2013-11-13 DIAGNOSIS — J309 Allergic rhinitis, unspecified: Secondary | ICD-10-CM | POA: Diagnosis not present

## 2013-11-20 DIAGNOSIS — J309 Allergic rhinitis, unspecified: Secondary | ICD-10-CM | POA: Diagnosis not present

## 2013-11-23 DIAGNOSIS — M7989 Other specified soft tissue disorders: Secondary | ICD-10-CM | POA: Diagnosis not present

## 2013-11-23 DIAGNOSIS — R262 Difficulty in walking, not elsewhere classified: Secondary | ICD-10-CM | POA: Diagnosis not present

## 2013-11-27 DIAGNOSIS — J309 Allergic rhinitis, unspecified: Secondary | ICD-10-CM | POA: Diagnosis not present

## 2013-12-04 DIAGNOSIS — J309 Allergic rhinitis, unspecified: Secondary | ICD-10-CM | POA: Diagnosis not present

## 2013-12-11 DIAGNOSIS — J309 Allergic rhinitis, unspecified: Secondary | ICD-10-CM | POA: Diagnosis not present

## 2013-12-18 DIAGNOSIS — J309 Allergic rhinitis, unspecified: Secondary | ICD-10-CM | POA: Diagnosis not present

## 2013-12-25 DIAGNOSIS — J309 Allergic rhinitis, unspecified: Secondary | ICD-10-CM | POA: Diagnosis not present

## 2013-12-27 DIAGNOSIS — M7672 Peroneal tendinitis, left leg: Secondary | ICD-10-CM | POA: Diagnosis not present

## 2013-12-31 DIAGNOSIS — J301 Allergic rhinitis due to pollen: Secondary | ICD-10-CM | POA: Diagnosis not present

## 2014-01-01 DIAGNOSIS — J3089 Other allergic rhinitis: Secondary | ICD-10-CM | POA: Diagnosis not present

## 2014-01-01 DIAGNOSIS — J301 Allergic rhinitis due to pollen: Secondary | ICD-10-CM | POA: Diagnosis not present

## 2014-01-07 DIAGNOSIS — Z96641 Presence of right artificial hip joint: Secondary | ICD-10-CM | POA: Diagnosis not present

## 2014-01-07 DIAGNOSIS — M25552 Pain in left hip: Secondary | ICD-10-CM | POA: Diagnosis not present

## 2014-01-07 DIAGNOSIS — Z471 Aftercare following joint replacement surgery: Secondary | ICD-10-CM | POA: Diagnosis not present

## 2014-01-08 DIAGNOSIS — M7672 Peroneal tendinitis, left leg: Secondary | ICD-10-CM | POA: Diagnosis not present

## 2014-01-11 ENCOUNTER — Other Ambulatory Visit: Payer: Self-pay

## 2014-01-14 DIAGNOSIS — Z283 Underimmunization status: Secondary | ICD-10-CM | POA: Diagnosis not present

## 2014-01-14 DIAGNOSIS — Z23 Encounter for immunization: Secondary | ICD-10-CM | POA: Diagnosis not present

## 2014-01-15 DIAGNOSIS — J301 Allergic rhinitis due to pollen: Secondary | ICD-10-CM | POA: Diagnosis not present

## 2014-01-15 DIAGNOSIS — J3089 Other allergic rhinitis: Secondary | ICD-10-CM | POA: Diagnosis not present

## 2014-01-22 DIAGNOSIS — J3089 Other allergic rhinitis: Secondary | ICD-10-CM | POA: Diagnosis not present

## 2014-01-22 DIAGNOSIS — J301 Allergic rhinitis due to pollen: Secondary | ICD-10-CM | POA: Diagnosis not present

## 2014-01-29 DIAGNOSIS — J3089 Other allergic rhinitis: Secondary | ICD-10-CM | POA: Diagnosis not present

## 2014-01-29 DIAGNOSIS — J301 Allergic rhinitis due to pollen: Secondary | ICD-10-CM | POA: Diagnosis not present

## 2014-01-30 DIAGNOSIS — L859 Epidermal thickening, unspecified: Secondary | ICD-10-CM | POA: Diagnosis not present

## 2014-01-31 DIAGNOSIS — J3089 Other allergic rhinitis: Secondary | ICD-10-CM | POA: Diagnosis not present

## 2014-02-03 DIAGNOSIS — S50811A Abrasion of right forearm, initial encounter: Secondary | ICD-10-CM | POA: Diagnosis not present

## 2014-02-05 DIAGNOSIS — M1711 Unilateral primary osteoarthritis, right knee: Secondary | ICD-10-CM | POA: Diagnosis not present

## 2014-02-05 DIAGNOSIS — J3089 Other allergic rhinitis: Secondary | ICD-10-CM | POA: Diagnosis not present

## 2014-02-05 DIAGNOSIS — J301 Allergic rhinitis due to pollen: Secondary | ICD-10-CM | POA: Diagnosis not present

## 2014-02-07 DIAGNOSIS — R600 Localized edema: Secondary | ICD-10-CM | POA: Diagnosis not present

## 2014-02-07 DIAGNOSIS — Z7901 Long term (current) use of anticoagulants: Secondary | ICD-10-CM | POA: Diagnosis not present

## 2014-02-07 DIAGNOSIS — I480 Paroxysmal atrial fibrillation: Secondary | ICD-10-CM | POA: Diagnosis not present

## 2014-02-07 DIAGNOSIS — Z6823 Body mass index (BMI) 23.0-23.9, adult: Secondary | ICD-10-CM | POA: Diagnosis not present

## 2014-02-12 DIAGNOSIS — J301 Allergic rhinitis due to pollen: Secondary | ICD-10-CM | POA: Diagnosis not present

## 2014-02-12 DIAGNOSIS — J3089 Other allergic rhinitis: Secondary | ICD-10-CM | POA: Diagnosis not present

## 2014-02-13 DIAGNOSIS — R600 Localized edema: Secondary | ICD-10-CM | POA: Diagnosis not present

## 2014-02-20 DIAGNOSIS — H524 Presbyopia: Secondary | ICD-10-CM | POA: Diagnosis not present

## 2014-02-20 DIAGNOSIS — H40013 Open angle with borderline findings, low risk, bilateral: Secondary | ICD-10-CM | POA: Diagnosis not present

## 2014-02-20 DIAGNOSIS — H2512 Age-related nuclear cataract, left eye: Secondary | ICD-10-CM | POA: Diagnosis not present

## 2014-02-20 DIAGNOSIS — H35373 Puckering of macula, bilateral: Secondary | ICD-10-CM | POA: Diagnosis not present

## 2014-02-26 DIAGNOSIS — J3089 Other allergic rhinitis: Secondary | ICD-10-CM | POA: Diagnosis not present

## 2014-02-26 DIAGNOSIS — J301 Allergic rhinitis due to pollen: Secondary | ICD-10-CM | POA: Diagnosis not present

## 2014-03-05 DIAGNOSIS — J3089 Other allergic rhinitis: Secondary | ICD-10-CM | POA: Diagnosis not present

## 2014-03-05 DIAGNOSIS — J301 Allergic rhinitis due to pollen: Secondary | ICD-10-CM | POA: Diagnosis not present

## 2014-03-12 DIAGNOSIS — J3089 Other allergic rhinitis: Secondary | ICD-10-CM | POA: Diagnosis not present

## 2014-03-12 DIAGNOSIS — J301 Allergic rhinitis due to pollen: Secondary | ICD-10-CM | POA: Diagnosis not present

## 2014-03-13 DIAGNOSIS — M1711 Unilateral primary osteoarthritis, right knee: Secondary | ICD-10-CM | POA: Diagnosis not present

## 2014-03-18 ENCOUNTER — Other Ambulatory Visit: Payer: Self-pay | Admitting: Dermatology

## 2014-03-18 DIAGNOSIS — Z85828 Personal history of other malignant neoplasm of skin: Secondary | ICD-10-CM | POA: Diagnosis not present

## 2014-03-18 DIAGNOSIS — D1801 Hemangioma of skin and subcutaneous tissue: Secondary | ICD-10-CM | POA: Diagnosis not present

## 2014-03-18 DIAGNOSIS — L814 Other melanin hyperpigmentation: Secondary | ICD-10-CM | POA: Diagnosis not present

## 2014-03-18 DIAGNOSIS — D692 Other nonthrombocytopenic purpura: Secondary | ICD-10-CM | POA: Diagnosis not present

## 2014-03-18 DIAGNOSIS — L821 Other seborrheic keratosis: Secondary | ICD-10-CM | POA: Diagnosis not present

## 2014-03-18 DIAGNOSIS — D3617 Benign neoplasm of peripheral nerves and autonomic nervous system of trunk, unspecified: Secondary | ICD-10-CM | POA: Diagnosis not present

## 2014-03-18 DIAGNOSIS — L570 Actinic keratosis: Secondary | ICD-10-CM | POA: Diagnosis not present

## 2014-03-20 DIAGNOSIS — M1711 Unilateral primary osteoarthritis, right knee: Secondary | ICD-10-CM | POA: Diagnosis not present

## 2014-03-25 DIAGNOSIS — J3089 Other allergic rhinitis: Secondary | ICD-10-CM | POA: Diagnosis not present

## 2014-03-25 DIAGNOSIS — J301 Allergic rhinitis due to pollen: Secondary | ICD-10-CM | POA: Diagnosis not present

## 2014-03-25 DIAGNOSIS — J4530 Mild persistent asthma, uncomplicated: Secondary | ICD-10-CM | POA: Diagnosis not present

## 2014-03-27 DIAGNOSIS — M1711 Unilateral primary osteoarthritis, right knee: Secondary | ICD-10-CM | POA: Diagnosis not present

## 2014-04-02 DIAGNOSIS — J301 Allergic rhinitis due to pollen: Secondary | ICD-10-CM | POA: Diagnosis not present

## 2014-04-02 DIAGNOSIS — J3089 Other allergic rhinitis: Secondary | ICD-10-CM | POA: Diagnosis not present

## 2014-04-09 DIAGNOSIS — J301 Allergic rhinitis due to pollen: Secondary | ICD-10-CM | POA: Diagnosis not present

## 2014-04-09 DIAGNOSIS — J3089 Other allergic rhinitis: Secondary | ICD-10-CM | POA: Diagnosis not present

## 2014-04-16 DIAGNOSIS — J301 Allergic rhinitis due to pollen: Secondary | ICD-10-CM | POA: Diagnosis not present

## 2014-04-16 DIAGNOSIS — J3089 Other allergic rhinitis: Secondary | ICD-10-CM | POA: Diagnosis not present

## 2014-04-18 DIAGNOSIS — R7301 Impaired fasting glucose: Secondary | ICD-10-CM | POA: Diagnosis not present

## 2014-04-18 DIAGNOSIS — I10 Essential (primary) hypertension: Secondary | ICD-10-CM | POA: Diagnosis not present

## 2014-04-18 DIAGNOSIS — Z0000 Encounter for general adult medical examination without abnormal findings: Secondary | ICD-10-CM | POA: Diagnosis not present

## 2014-04-18 DIAGNOSIS — Z125 Encounter for screening for malignant neoplasm of prostate: Secondary | ICD-10-CM | POA: Diagnosis not present

## 2014-04-18 DIAGNOSIS — E785 Hyperlipidemia, unspecified: Secondary | ICD-10-CM | POA: Diagnosis not present

## 2014-04-23 DIAGNOSIS — J301 Allergic rhinitis due to pollen: Secondary | ICD-10-CM | POA: Diagnosis not present

## 2014-04-23 DIAGNOSIS — M1711 Unilateral primary osteoarthritis, right knee: Secondary | ICD-10-CM | POA: Diagnosis not present

## 2014-04-23 DIAGNOSIS — J3089 Other allergic rhinitis: Secondary | ICD-10-CM | POA: Diagnosis not present

## 2014-04-25 DIAGNOSIS — Z7901 Long term (current) use of anticoagulants: Secondary | ICD-10-CM | POA: Diagnosis not present

## 2014-04-25 DIAGNOSIS — J449 Chronic obstructive pulmonary disease, unspecified: Secondary | ICD-10-CM | POA: Diagnosis not present

## 2014-04-25 DIAGNOSIS — Z6823 Body mass index (BMI) 23.0-23.9, adult: Secondary | ICD-10-CM | POA: Diagnosis not present

## 2014-04-25 DIAGNOSIS — I480 Paroxysmal atrial fibrillation: Secondary | ICD-10-CM | POA: Diagnosis not present

## 2014-04-25 DIAGNOSIS — J82 Pulmonary eosinophilia, not elsewhere classified: Secondary | ICD-10-CM | POA: Diagnosis not present

## 2014-04-25 DIAGNOSIS — Z1389 Encounter for screening for other disorder: Secondary | ICD-10-CM | POA: Diagnosis not present

## 2014-04-25 DIAGNOSIS — I471 Supraventricular tachycardia: Secondary | ICD-10-CM | POA: Diagnosis not present

## 2014-04-25 DIAGNOSIS — R7301 Impaired fasting glucose: Secondary | ICD-10-CM | POA: Diagnosis not present

## 2014-04-25 DIAGNOSIS — E785 Hyperlipidemia, unspecified: Secondary | ICD-10-CM | POA: Diagnosis not present

## 2014-04-25 DIAGNOSIS — I10 Essential (primary) hypertension: Secondary | ICD-10-CM | POA: Diagnosis not present

## 2014-04-25 DIAGNOSIS — Z0000 Encounter for general adult medical examination without abnormal findings: Secondary | ICD-10-CM | POA: Diagnosis not present

## 2014-04-25 DIAGNOSIS — J309 Allergic rhinitis, unspecified: Secondary | ICD-10-CM | POA: Diagnosis not present

## 2014-04-30 DIAGNOSIS — J301 Allergic rhinitis due to pollen: Secondary | ICD-10-CM | POA: Diagnosis not present

## 2014-04-30 DIAGNOSIS — J3089 Other allergic rhinitis: Secondary | ICD-10-CM | POA: Diagnosis not present

## 2014-05-02 DIAGNOSIS — Z1212 Encounter for screening for malignant neoplasm of rectum: Secondary | ICD-10-CM | POA: Diagnosis not present

## 2014-05-07 DIAGNOSIS — J3089 Other allergic rhinitis: Secondary | ICD-10-CM | POA: Diagnosis not present

## 2014-05-07 DIAGNOSIS — J301 Allergic rhinitis due to pollen: Secondary | ICD-10-CM | POA: Diagnosis not present

## 2014-05-14 DIAGNOSIS — J301 Allergic rhinitis due to pollen: Secondary | ICD-10-CM | POA: Diagnosis not present

## 2014-05-14 DIAGNOSIS — J3089 Other allergic rhinitis: Secondary | ICD-10-CM | POA: Diagnosis not present

## 2014-05-28 DIAGNOSIS — J301 Allergic rhinitis due to pollen: Secondary | ICD-10-CM | POA: Diagnosis not present

## 2014-05-28 DIAGNOSIS — J3089 Other allergic rhinitis: Secondary | ICD-10-CM | POA: Diagnosis not present

## 2014-06-04 DIAGNOSIS — J3089 Other allergic rhinitis: Secondary | ICD-10-CM | POA: Diagnosis not present

## 2014-06-04 DIAGNOSIS — J301 Allergic rhinitis due to pollen: Secondary | ICD-10-CM | POA: Diagnosis not present

## 2014-06-11 DIAGNOSIS — J301 Allergic rhinitis due to pollen: Secondary | ICD-10-CM | POA: Diagnosis not present

## 2014-06-11 DIAGNOSIS — J3089 Other allergic rhinitis: Secondary | ICD-10-CM | POA: Diagnosis not present

## 2014-06-18 DIAGNOSIS — J3089 Other allergic rhinitis: Secondary | ICD-10-CM | POA: Diagnosis not present

## 2014-06-18 DIAGNOSIS — J301 Allergic rhinitis due to pollen: Secondary | ICD-10-CM | POA: Diagnosis not present

## 2014-06-25 DIAGNOSIS — J3089 Other allergic rhinitis: Secondary | ICD-10-CM | POA: Diagnosis not present

## 2014-06-25 DIAGNOSIS — J301 Allergic rhinitis due to pollen: Secondary | ICD-10-CM | POA: Diagnosis not present

## 2014-07-09 DIAGNOSIS — J301 Allergic rhinitis due to pollen: Secondary | ICD-10-CM | POA: Diagnosis not present

## 2014-07-09 DIAGNOSIS — J3089 Other allergic rhinitis: Secondary | ICD-10-CM | POA: Diagnosis not present

## 2014-07-16 DIAGNOSIS — J3089 Other allergic rhinitis: Secondary | ICD-10-CM | POA: Diagnosis not present

## 2014-07-16 DIAGNOSIS — J301 Allergic rhinitis due to pollen: Secondary | ICD-10-CM | POA: Diagnosis not present

## 2014-07-23 DIAGNOSIS — J3089 Other allergic rhinitis: Secondary | ICD-10-CM | POA: Diagnosis not present

## 2014-07-23 DIAGNOSIS — J301 Allergic rhinitis due to pollen: Secondary | ICD-10-CM | POA: Diagnosis not present

## 2014-07-30 DIAGNOSIS — J3089 Other allergic rhinitis: Secondary | ICD-10-CM | POA: Diagnosis not present

## 2014-07-30 DIAGNOSIS — J301 Allergic rhinitis due to pollen: Secondary | ICD-10-CM | POA: Diagnosis not present

## 2014-08-08 DIAGNOSIS — Z85828 Personal history of other malignant neoplasm of skin: Secondary | ICD-10-CM | POA: Diagnosis not present

## 2014-08-08 DIAGNOSIS — L820 Inflamed seborrheic keratosis: Secondary | ICD-10-CM | POA: Diagnosis not present

## 2014-08-13 DIAGNOSIS — J3089 Other allergic rhinitis: Secondary | ICD-10-CM | POA: Diagnosis not present

## 2014-08-13 DIAGNOSIS — J301 Allergic rhinitis due to pollen: Secondary | ICD-10-CM | POA: Diagnosis not present

## 2014-08-14 DIAGNOSIS — J301 Allergic rhinitis due to pollen: Secondary | ICD-10-CM | POA: Diagnosis not present

## 2014-08-14 DIAGNOSIS — J3089 Other allergic rhinitis: Secondary | ICD-10-CM | POA: Diagnosis not present

## 2014-08-20 DIAGNOSIS — J301 Allergic rhinitis due to pollen: Secondary | ICD-10-CM | POA: Diagnosis not present

## 2014-08-20 DIAGNOSIS — J3089 Other allergic rhinitis: Secondary | ICD-10-CM | POA: Diagnosis not present

## 2014-08-27 DIAGNOSIS — J301 Allergic rhinitis due to pollen: Secondary | ICD-10-CM | POA: Diagnosis not present

## 2014-08-27 DIAGNOSIS — J3089 Other allergic rhinitis: Secondary | ICD-10-CM | POA: Diagnosis not present

## 2014-08-29 DIAGNOSIS — J3089 Other allergic rhinitis: Secondary | ICD-10-CM | POA: Diagnosis not present

## 2014-08-29 DIAGNOSIS — J301 Allergic rhinitis due to pollen: Secondary | ICD-10-CM | POA: Diagnosis not present

## 2014-09-03 DIAGNOSIS — J3089 Other allergic rhinitis: Secondary | ICD-10-CM | POA: Diagnosis not present

## 2014-09-03 DIAGNOSIS — J301 Allergic rhinitis due to pollen: Secondary | ICD-10-CM | POA: Diagnosis not present

## 2014-09-05 DIAGNOSIS — J3089 Other allergic rhinitis: Secondary | ICD-10-CM | POA: Diagnosis not present

## 2014-09-05 DIAGNOSIS — J301 Allergic rhinitis due to pollen: Secondary | ICD-10-CM | POA: Diagnosis not present

## 2014-09-10 DIAGNOSIS — J3089 Other allergic rhinitis: Secondary | ICD-10-CM | POA: Diagnosis not present

## 2014-09-10 DIAGNOSIS — J301 Allergic rhinitis due to pollen: Secondary | ICD-10-CM | POA: Diagnosis not present

## 2014-09-11 ENCOUNTER — Ambulatory Visit (INDEPENDENT_AMBULATORY_CARE_PROVIDER_SITE_OTHER): Payer: Medicare Other | Admitting: Sports Medicine

## 2014-09-11 ENCOUNTER — Encounter: Payer: Self-pay | Admitting: Sports Medicine

## 2014-09-11 VITALS — BP 120/78 | HR 63 | Ht 70.0 in | Wt 155.0 lb

## 2014-09-11 DIAGNOSIS — M1711 Unilateral primary osteoarthritis, right knee: Secondary | ICD-10-CM

## 2014-09-11 DIAGNOSIS — M5136 Other intervertebral disc degeneration, lumbar region: Secondary | ICD-10-CM | POA: Diagnosis not present

## 2014-09-11 DIAGNOSIS — M51369 Other intervertebral disc degeneration, lumbar region without mention of lumbar back pain or lower extremity pain: Secondary | ICD-10-CM | POA: Insufficient documentation

## 2014-09-11 NOTE — Progress Notes (Signed)
Patient ID: [name], male   DOB: 1924-11-10, 81 y.o.   MRN: [id number]  Retired occupational medicine physician He had a fall in the bathtub about a month ago and has some persistent back pain He has had a remote lumbar fusion Back films which he shows me his telephone shows diffuse degenerative disc disease and arthritis change  Walking brings on back pain in sitting causes it to resolve  Since the fall he has some increased pain more on the left but also over the right quadratus lumborum No neurologic symptoms He continues to do a series of core and abdominal exercises and these help his back  A second problem is chronic knee pain This hurts too much for him to do much walking Right knee sometimes feels a bit unstable  Physical exam Alert pleasant and in no acute distress BP 120/78 mmHg  Pulse 63  Ht '5\' 10"'$  (1.778 m)  Wt 155 lb (70.308 kg)  BMI 22.24 kg/m2  Lumbar spine reveals some spasm particularly over the left quadratus lumborum Spine itself shows some scoliotic change plus a loss of lumbar lordosis There is a step off noted between L4 and L5  Neurologic testing was unremarkable  Right knee show some chronic degenerative changes on appearance There is an effusion Valgus shift Crepitation with rotation He lacks a few degrees of full extension and can flex to 120  Ultrasound Right knee shows a large effusion with calcification Medial meniscus is preserved Lateral meniscus is markedly degenerated and there are some areas of bone-on-bone Patellar tendon intact

## 2014-09-11 NOTE — Assessment & Plan Note (Signed)
I suggested that we add compression because the degree of swelling probably causes most of his pain  We may at a later want to add some support to shoes to lessen the degree of valgus shift

## 2014-09-11 NOTE — Patient Instructions (Signed)
  T exercises holding a light weight in each hand  Side to side - do 6 repeats Rotate front to back  - do 6 repeats Later increase to 2 or 3 sets  Knee to chest stretch while sitting Knee to the opposite shoulder  Keep up your abdominal and core exercises  Try the compression when walking or standing a lot Leave on for 30 minutes after to keep swelling down  You have a large knee effusion on RT that is probably painful

## 2014-09-11 NOTE — Assessment & Plan Note (Signed)
He is on a good set of core exercises  I gave him 4 additional exercises to help them resolve the spasm over his quadratus lumborum  Careful with use of Celebrex because of his cardiac status

## 2014-09-13 DIAGNOSIS — J301 Allergic rhinitis due to pollen: Secondary | ICD-10-CM | POA: Diagnosis not present

## 2014-09-16 DIAGNOSIS — J301 Allergic rhinitis due to pollen: Secondary | ICD-10-CM | POA: Diagnosis not present

## 2014-09-16 DIAGNOSIS — J3089 Other allergic rhinitis: Secondary | ICD-10-CM | POA: Diagnosis not present

## 2014-09-16 DIAGNOSIS — J4530 Mild persistent asthma, uncomplicated: Secondary | ICD-10-CM | POA: Diagnosis not present

## 2014-09-17 DIAGNOSIS — J3089 Other allergic rhinitis: Secondary | ICD-10-CM | POA: Diagnosis not present

## 2014-09-17 DIAGNOSIS — J301 Allergic rhinitis due to pollen: Secondary | ICD-10-CM | POA: Diagnosis not present

## 2014-09-19 DIAGNOSIS — D1801 Hemangioma of skin and subcutaneous tissue: Secondary | ICD-10-CM | POA: Diagnosis not present

## 2014-09-19 DIAGNOSIS — L570 Actinic keratosis: Secondary | ICD-10-CM | POA: Diagnosis not present

## 2014-09-19 DIAGNOSIS — L821 Other seborrheic keratosis: Secondary | ICD-10-CM | POA: Diagnosis not present

## 2014-09-19 DIAGNOSIS — Z85828 Personal history of other malignant neoplasm of skin: Secondary | ICD-10-CM | POA: Diagnosis not present

## 2014-09-19 DIAGNOSIS — L720 Epidermal cyst: Secondary | ICD-10-CM | POA: Diagnosis not present

## 2014-09-19 DIAGNOSIS — D692 Other nonthrombocytopenic purpura: Secondary | ICD-10-CM | POA: Diagnosis not present

## 2014-09-19 DIAGNOSIS — D3617 Benign neoplasm of peripheral nerves and autonomic nervous system of trunk, unspecified: Secondary | ICD-10-CM | POA: Diagnosis not present

## 2014-09-23 ENCOUNTER — Other Ambulatory Visit: Payer: Self-pay

## 2014-09-24 DIAGNOSIS — J301 Allergic rhinitis due to pollen: Secondary | ICD-10-CM | POA: Diagnosis not present

## 2014-09-24 DIAGNOSIS — J3089 Other allergic rhinitis: Secondary | ICD-10-CM | POA: Diagnosis not present

## 2014-09-27 DIAGNOSIS — J3089 Other allergic rhinitis: Secondary | ICD-10-CM | POA: Diagnosis not present

## 2014-10-02 DIAGNOSIS — M1711 Unilateral primary osteoarthritis, right knee: Secondary | ICD-10-CM | POA: Diagnosis not present

## 2014-10-08 DIAGNOSIS — J301 Allergic rhinitis due to pollen: Secondary | ICD-10-CM | POA: Diagnosis not present

## 2014-10-08 DIAGNOSIS — J3089 Other allergic rhinitis: Secondary | ICD-10-CM | POA: Diagnosis not present

## 2014-10-10 ENCOUNTER — Ambulatory Visit (INDEPENDENT_AMBULATORY_CARE_PROVIDER_SITE_OTHER): Payer: Medicare Other | Admitting: Sports Medicine

## 2014-10-10 ENCOUNTER — Encounter: Payer: Self-pay | Admitting: Sports Medicine

## 2014-10-10 VITALS — BP 119/61 | HR 57 | Ht 70.0 in | Wt 155.0 lb

## 2014-10-10 DIAGNOSIS — M5136 Other intervertebral disc degeneration, lumbar region: Secondary | ICD-10-CM

## 2014-10-10 MED ORDER — GABAPENTIN 100 MG PO CAPS: 100.0000 mg | ORAL_CAPSULE | Freq: Every day | ORAL | Status: DC

## 2014-10-10 NOTE — Patient Instructions (Signed)
When things are better consider trying the gabapentin at 100 mgm at night If you get side effects you can stop  Keep up exercise program but moderate this on bad days  I will call you with Xray result - do the day that works best for you  See me in 4 weeks  We may try a type of compression for your back on that visit

## 2014-10-10 NOTE — Assessment & Plan Note (Signed)
Clearly shown some signs of improvement Keep up the exercise regimen  Since we have no current x-rays I will be sure the slip has not progressed  Schedule lumbosacral spine films  No change in medications  Recheck 4 weeks

## 2014-10-10 NOTE — Progress Notes (Signed)
Patient ID: [name], male   DOB: 1924-05-04, 81 y.o.   MRN: [id number]  Patient returns for followup of low back pain Last visit I felt he had a spondylolisthesis based on x-ray and the fact that I could feel a step-off on his examination He started on a series of core exercises and abdominal strengthening We added a few stretches to get him to neutral extension  Since that time he feels when he does the exercises that he has much less pain Standing for an hour or too much walking will cause the pain to radiate down into his upper hamstrings  He has chronic insomnia for which he is on a fairly powerful series of medications This includes zolpidem, temazepam and  either  gabapentin or Flexeril at night Recently gabapentin gave him ataxia so he is not taking that This regimen will allow him to get to 6 hours of sleep nightly  Physical examination No acute distress BP 119/61 mmHg  Pulse 57  Ht '5\' 10"'$  (1.778 m)  Wt 155 lb (70.308 kg)  BMI 22.24 kg/m2  Low back shows less spasm The degree of forward flexion of the lumbar spine is less Step off I felt before seems much less Limited extension Flexion is limited by about 20 but probably normal for age Lateral bend and rotation are limited  Walking gait shows better pelvic motion and less Trendelenburg

## 2014-10-14 ENCOUNTER — Ambulatory Visit
Admission: RE | Admit: 2014-10-14 | Discharge: 2014-10-14 | Disposition: A | Discharge status: Home / Self Care | Payer: Medicare Other | Source: Ambulatory Visit | Attending: Sports Medicine | Admitting: Sports Medicine

## 2014-10-14 ENCOUNTER — Other Ambulatory Visit: Payer: Medicare Other

## 2014-10-14 DIAGNOSIS — M5136 Other intervertebral disc degeneration, lumbar region: Secondary | ICD-10-CM | POA: Diagnosis not present

## 2014-10-14 DIAGNOSIS — M47816 Spondylosis without myelopathy or radiculopathy, lumbar region: Secondary | ICD-10-CM | POA: Diagnosis not present

## 2014-10-15 DIAGNOSIS — J301 Allergic rhinitis due to pollen: Secondary | ICD-10-CM | POA: Diagnosis not present

## 2014-10-15 DIAGNOSIS — J3089 Other allergic rhinitis: Secondary | ICD-10-CM | POA: Diagnosis not present

## 2014-10-16 DIAGNOSIS — R05 Cough: Secondary | ICD-10-CM | POA: Diagnosis not present

## 2014-10-16 DIAGNOSIS — J189 Pneumonia, unspecified organism: Secondary | ICD-10-CM | POA: Diagnosis not present

## 2014-10-16 DIAGNOSIS — J449 Chronic obstructive pulmonary disease, unspecified: Secondary | ICD-10-CM | POA: Diagnosis not present

## 2014-10-16 DIAGNOSIS — J82 Pulmonary eosinophilia, not elsewhere classified: Secondary | ICD-10-CM | POA: Diagnosis not present

## 2014-10-22 DIAGNOSIS — J301 Allergic rhinitis due to pollen: Secondary | ICD-10-CM | POA: Diagnosis not present

## 2014-10-22 DIAGNOSIS — J3089 Other allergic rhinitis: Secondary | ICD-10-CM | POA: Diagnosis not present

## 2014-11-05 DIAGNOSIS — J3089 Other allergic rhinitis: Secondary | ICD-10-CM | POA: Diagnosis not present

## 2014-11-05 DIAGNOSIS — J301 Allergic rhinitis due to pollen: Secondary | ICD-10-CM | POA: Diagnosis not present

## 2014-11-06 DIAGNOSIS — M1711 Unilateral primary osteoarthritis, right knee: Secondary | ICD-10-CM | POA: Diagnosis not present

## 2014-11-07 ENCOUNTER — Ambulatory Visit (INDEPENDENT_AMBULATORY_CARE_PROVIDER_SITE_OTHER): Payer: Medicare Other | Admitting: Sports Medicine

## 2014-11-07 VITALS — BP 127/67 | HR 61 | Ht 70.0 in | Wt 155.0 lb

## 2014-11-07 DIAGNOSIS — S46012A Strain of muscle(s) and tendon(s) of the rotator cuff of left shoulder, initial encounter: Secondary | ICD-10-CM

## 2014-11-07 DIAGNOSIS — S32010A Wedge compression fracture of first lumbar vertebra, initial encounter for closed fracture: Secondary | ICD-10-CM

## 2014-11-07 DIAGNOSIS — S46019A Strain of muscle(s) and tendon(s) of the rotator cuff of unspecified shoulder, initial encounter: Secondary | ICD-10-CM | POA: Insufficient documentation

## 2014-11-07 NOTE — Assessment & Plan Note (Signed)
At his age and with this combination of medicines I hesitated to start nitroglycerin protocol  His pain is not limiting  I suggested these range of motion  If pain increases or becomes uncomfortable we can consider corticosteroid injection or possibly nitroglycerin  He will continue use Celebrex

## 2014-11-07 NOTE — Assessment & Plan Note (Signed)
I reviewed his x-rays and modified the back exercises we've given him in the past  I think the most important thing is to do some easy motion and posture exercises for the low back  Continue to do aerobic activities that do not cause too much pain  Recheck in 6 weeks

## 2014-11-07 NOTE — Progress Notes (Signed)
Patient ID: ELEANOR DIMICHELE, male   DOB: 01/06/1925, 81 y.o.   MRN: [id number]  [address] 10 days ago Left shoulder hurt after He actually fell back and hit his head This happened at 1 am (had taken flexeril 5/ ambien and had a beer before sleep) Now it hurts him to put on a shirt It also hurts to turn the steering wheel of a car He was able to get it overhead but experiences pain  LBP Last week XRay showed he has an L1 compression fracture Grade 1 spondy This is gradually getting better Feels better using a lumbar support  Physical exam Elderly white male who is alert and well oriented BP 127/67 mmHg  Pulse 61  Ht '5\' 10"'$  (1.778 m)  Wt 155 lb (70.308 kg)  BMI 22.24 kg/m2  Left shoulder He has a nearly full range of motion but gets pain with elevation Pain on the upper humerus with external rotation Weakness and pain on resisted internal rotation Only slight weakness of impingement testing or supraspinatous testing External rotation seems normal Speeds test normal  Right shoulder full range of motion He has a lipoma or soft tissue mass on the right biceps History of complete rotator cuff tear but has good functional strength throughout  Low back exam is unchanged except he has less tenderness  X-rays reviewed with the patient that showed a compression fracture and the spondylolysis and spondylolisthesis

## 2014-11-15 DIAGNOSIS — M1711 Unilateral primary osteoarthritis, right knee: Secondary | ICD-10-CM | POA: Diagnosis not present

## 2014-11-19 DIAGNOSIS — J301 Allergic rhinitis due to pollen: Secondary | ICD-10-CM | POA: Diagnosis not present

## 2014-11-19 DIAGNOSIS — J3089 Other allergic rhinitis: Secondary | ICD-10-CM | POA: Diagnosis not present

## 2014-11-20 DIAGNOSIS — S32019D Unspecified fracture of first lumbar vertebra, subsequent encounter for fracture with routine healing: Secondary | ICD-10-CM | POA: Diagnosis not present

## 2014-11-20 DIAGNOSIS — R7301 Impaired fasting glucose: Secondary | ICD-10-CM | POA: Diagnosis not present

## 2014-11-20 DIAGNOSIS — Z6823 Body mass index (BMI) 23.0-23.9, adult: Secondary | ICD-10-CM | POA: Diagnosis not present

## 2014-11-20 DIAGNOSIS — R350 Frequency of micturition: Secondary | ICD-10-CM | POA: Diagnosis not present

## 2014-11-20 DIAGNOSIS — Z7901 Long term (current) use of anticoagulants: Secondary | ICD-10-CM | POA: Diagnosis not present

## 2014-11-20 DIAGNOSIS — G4700 Insomnia, unspecified: Secondary | ICD-10-CM | POA: Diagnosis not present

## 2014-11-20 DIAGNOSIS — W19XXXA Unspecified fall, initial encounter: Secondary | ICD-10-CM | POA: Diagnosis not present

## 2014-11-20 DIAGNOSIS — I480 Paroxysmal atrial fibrillation: Secondary | ICD-10-CM | POA: Diagnosis not present

## 2014-11-20 DIAGNOSIS — J189 Pneumonia, unspecified organism: Secondary | ICD-10-CM | POA: Diagnosis not present

## 2014-11-20 DIAGNOSIS — J82 Pulmonary eosinophilia, not elsewhere classified: Secondary | ICD-10-CM | POA: Diagnosis not present

## 2014-11-21 ENCOUNTER — Other Ambulatory Visit (HOSPITAL_COMMUNITY): Payer: Self-pay | Admitting: Respiratory Therapy

## 2014-11-21 DIAGNOSIS — J82 Pulmonary eosinophilia, not elsewhere classified: Principal | ICD-10-CM

## 2014-11-21 DIAGNOSIS — J8289 Other pulmonary eosinophilia, not elsewhere classified: Secondary | ICD-10-CM

## 2014-11-22 DIAGNOSIS — M1711 Unilateral primary osteoarthritis, right knee: Secondary | ICD-10-CM | POA: Diagnosis not present

## 2014-11-25 ENCOUNTER — Other Ambulatory Visit: Payer: Self-pay | Admitting: Internal Medicine

## 2014-11-25 ENCOUNTER — Ambulatory Visit (HOSPITAL_COMMUNITY)
Admission: RE | Admit: 2014-11-25 | Discharge: 2014-11-25 | Disposition: A | Discharge status: Home / Self Care | Payer: Medicare Other | Source: Ambulatory Visit | Attending: Internal Medicine | Admitting: Internal Medicine

## 2014-11-25 DIAGNOSIS — J82 Pulmonary eosinophilia, not elsewhere classified: Secondary | ICD-10-CM | POA: Insufficient documentation

## 2014-11-25 DIAGNOSIS — R9389 Abnormal findings on diagnostic imaging of other specified body structures: Secondary | ICD-10-CM

## 2014-11-27 ENCOUNTER — Other Ambulatory Visit: Payer: Self-pay | Admitting: Internal Medicine

## 2014-11-27 DIAGNOSIS — R9389 Abnormal findings on diagnostic imaging of other specified body structures: Secondary | ICD-10-CM

## 2014-11-27 LAB — PULMONARY FUNCTION TEST
DL/VA % pred: 91 %
DL/VA: 4.17 ml/min/mmHg/L
DLCO UNC: 17.76 ml/min/mmHg
DLCO unc % pred: 54 %
FEF 25-75 Pre: 0.85 L/sec
FEF2575-%PRED-PRE: 54 %
FEV1-%Pred-Pre: 58 %
FEV1-Pre: 1.47 L
FEV1FVC-%PRED-PRE: 100 %
FEV6-%Pred-Pre: 61 %
FEV6-PRE: 2.06 L
FEV6FVC-%Pred-Pre: 107 %
FVC-%Pred-Pre: 56 %
FVC-Pre: 2.09 L
PRE FEV1/FVC RATIO: 70 %
Pre FEV6/FVC Ratio: 99 %
RV % pred: 92 %
RV: 2.63 L
TLC % pred: 71 %
TLC: 5.07 L

## 2014-11-28 DIAGNOSIS — J90 Pleural effusion, not elsewhere classified: Secondary | ICD-10-CM

## 2014-11-28 HISTORY — DX: Pleural effusion, not elsewhere classified: J90

## 2014-11-29 ENCOUNTER — Ambulatory Visit
Admission: RE | Admit: 2014-11-29 | Discharge: 2014-11-29 | Disposition: A | Discharge status: Home / Self Care | Payer: Medicare Other | Source: Ambulatory Visit | Attending: Internal Medicine | Admitting: Internal Medicine

## 2014-11-29 DIAGNOSIS — R05 Cough: Secondary | ICD-10-CM | POA: Diagnosis not present

## 2014-11-29 DIAGNOSIS — R918 Other nonspecific abnormal finding of lung field: Secondary | ICD-10-CM | POA: Diagnosis not present

## 2014-11-29 DIAGNOSIS — R9389 Abnormal findings on diagnostic imaging of other specified body structures: Secondary | ICD-10-CM

## 2014-11-29 MED ORDER — IOPAMIDOL (ISOVUE-300) INJECTION 61%
75.0000 mL | Freq: Once | INTRAVENOUS | Status: AC | PRN
  Administered 2014-11-29: 75 mL via INTRAVENOUS

## 2014-12-03 ENCOUNTER — Other Ambulatory Visit (HOSPITAL_COMMUNITY): Payer: Self-pay | Admitting: Internal Medicine

## 2014-12-03 DIAGNOSIS — J3089 Other allergic rhinitis: Secondary | ICD-10-CM | POA: Diagnosis not present

## 2014-12-03 DIAGNOSIS — J301 Allergic rhinitis due to pollen: Secondary | ICD-10-CM | POA: Diagnosis not present

## 2014-12-03 DIAGNOSIS — R911 Solitary pulmonary nodule: Secondary | ICD-10-CM

## 2014-12-09 ENCOUNTER — Encounter (HOSPITAL_COMMUNITY)
Admission: RE | Admit: 2014-12-09 | Discharge: 2014-12-09 | Disposition: A | Discharge status: Home / Self Care | Payer: Medicare Other | Source: Ambulatory Visit | Attending: Internal Medicine | Admitting: Internal Medicine

## 2014-12-09 DIAGNOSIS — Z79899 Other long term (current) drug therapy: Secondary | ICD-10-CM | POA: Insufficient documentation

## 2014-12-09 DIAGNOSIS — J984 Other disorders of lung: Secondary | ICD-10-CM | POA: Insufficient documentation

## 2014-12-09 DIAGNOSIS — R918 Other nonspecific abnormal finding of lung field: Secondary | ICD-10-CM | POA: Diagnosis not present

## 2014-12-09 DIAGNOSIS — R911 Solitary pulmonary nodule: Secondary | ICD-10-CM

## 2014-12-09 LAB — GLUCOSE, CAPILLARY: GLUCOSE-CAPILLARY: 91 mg/dL (ref 65–99)

## 2014-12-09 MED ORDER — FLUDEOXYGLUCOSE F - 18 (FDG) INJECTION
7.6600 | Freq: Once | INTRAVENOUS | Status: DC | PRN
  Administered 2014-12-09: 7.66 via INTRAVENOUS
  Filled 2014-12-09: qty 7.66

## 2014-12-11 ENCOUNTER — Ambulatory Visit (INDEPENDENT_AMBULATORY_CARE_PROVIDER_SITE_OTHER): Payer: Medicare Other | Admitting: Pulmonary Disease

## 2014-12-11 ENCOUNTER — Encounter: Payer: Self-pay | Admitting: Pulmonary Disease

## 2014-12-11 VITALS — BP 128/76 | Ht 70.0 in | Wt 169.0 lb

## 2014-12-11 DIAGNOSIS — R918 Other nonspecific abnormal finding of lung field: Secondary | ICD-10-CM

## 2014-12-11 DIAGNOSIS — C3491 Malignant neoplasm of unspecified part of right bronchus or lung: Secondary | ICD-10-CM | POA: Insufficient documentation

## 2014-12-11 HISTORY — DX: Malignant neoplasm of unspecified part of right bronchus or lung: C34.91

## 2014-12-11 NOTE — Patient Instructions (Signed)
I will review the images from your CT chest with Dr. Lamonte Sakai Telemetry your grandson to call me to discuss your situation Call me back once you have decided how you want to have this biopsied.

## 2014-12-11 NOTE — Progress Notes (Signed)
Subjective:    Patient ID: [name], male    DOB: 02/23/1925, 81 y.o.   MRN: [id number]  HPI Chief Complaint  Patient presents with  . Advice Only    referred by Dr. Brigitte Pulse for possible lung ca.  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 He has a history of Loeffler syndrome this experienced repeated episodes of chest infections and various respiratory complaints over the years. These eventrated off and on with prednisone and at times antibiotics. He notes that he smoked about 2-1/2 packs of cigarettes daily for approximately 12 years and quit in 1957.   This summer he said that he was experiencing a cough which she attributed to his typical respiratory complaints and allergies. However, he required 3 rounds of antibiotics and prednisone so he came in to see his physician with the third round to have a chest x-ray. This showed an abnormality in the left lung which led to a CT scan of his chest. This demonstrated a 3 cm lesion in the left upper lobe. A follow-up PET/CT scan showed increased FDG activity in the nodule but no other activity elsewhere.  He says that his cough has resolved. He has actually gained weight recently and not lost weight. He does have some leg swelling which he attributes to the weight gain. He does not have any hemoptysis. He denies chest pain. He does not have fevers or chills.   Past Medical History  Diagnosis Date  . Bradycardia   . Palpitations   . Asthma   . Hemorrhoids   . Hx of adenomatous colonic polyps 02/1997  . Atrial fibrillation   . Hypertension   . Pulmonary aspergillosis   . Diverticulosis of colon   . COPD (chronic obstructive pulmonary disease)     mild  . Bowel obstruction      Family History  Problem Relation Age of Onset  . Lung cancer Father 57  . Stroke Mother 79  . Leukemia Brother   . Coronary artery disease Brother   . Heart  failure Brother      Social History   Social History  . Marital Status: Married    Spouse Name: N/A  . Number of Children: N/A  . Years of Education: N/A   Occupational History  . retired physician    Social History Main Topics  . Smoking status: Former Smoker -- 2.00 packs/day for 13 years    Types: Cigarettes    Quit date: 03/29/1956  . Smokeless tobacco: Never Used  . Alcohol Use: No     Comment: Quit drinking EtOH in 2012.  Used to drink on weekends 2-3 bottles of wine.  . Drug Use: No  . Sexual Activity: Not on file   Other Topics Concern  . Not on file   Social History Narrative     Allergies  Allergen Reactions  . Sulfonamide Derivatives      Outpatient Prescriptions Prior to Visit  Medication Sig Dispense Refill  . apixaban (ELIQUIS) 5 MG TABS tablet Take 1 tablet (5 mg total) by mouth 2 (two) times daily.    . celecoxib (CELEBREX) 200 MG capsule Take 200 mg by mouth daily.      . Cetirizine HCl (ZYRTEC ALLERGY PO) Take 10 mg by mouth daily.      . CRESTOR 10 MG tablet     . finasteride (  PROSCAR) 5 MG tablet Take 5 mg by mouth daily.      . fluticasone (FLONASE) 50 MCG/ACT nasal spray Place 2 sprays into the nose daily.      . Fluticasone-Salmeterol (ADVAIR DISKUS) 250-50 MCG/DOSE AEPB Inhale 1 puff into the lungs 2 (two) times daily.      Marland Kitchen gabapentin (NEURONTIN) 300 MG capsule     . Guaifenesin-Codeine (GUAIFENESIN AC PO) Take by mouth as needed.     . montelukast (SINGULAIR) 10 MG tablet Take 10 mg by mouth at bedtime.      . NON FORMULARY allergy shots     . omeprazole (PRILOSEC) 20 MG capsule Take 20 mg by mouth daily.      . temazepam (RESTORIL) 15 MG capsule Take 15 mg by mouth daily.      . verapamil (COVERA HS) 180 MG (CO) 24 hr tablet Take 1 tablet (180 mg total) by mouth at bedtime. 30 tablet 5  . XOPENEX HFA 45 MCG/ACT inhaler     . zolpidem (AMBIEN) 10 MG tablet Take 10 mg by mouth at bedtime as needed.      . B Complex Vitamins (VITAMIN-B  COMPLEX PO) 1 tab po qd     . Calcium 600-200 MG-UNIT per tablet Take 1 tablet by mouth daily.      . cholecalciferol (VITAMIN D) 400 UNITS TABS Take 1,000 Units by mouth.      . cyclobenzaprine (FLEXERIL) 10 MG tablet Take 10 mg by mouth at bedtime as needed.    Marland Kitchen FINACEA 15 % cream as needed.    . gabapentin (NEURONTIN) 100 MG capsule Take 1 capsule (100 mg total) by mouth at bedtime. (Patient not taking: Reported on 11/07/2014) 30 capsule 5  . levofloxacin (LEVAQUIN) 500 MG tablet     . methylPREDNISolone (MEDROL DOSEPAK) 4 MG TBPK tablet     . rosuvastatin (CRESTOR) 5 MG tablet Take 5 mg by mouth daily.      . verapamil (CALAN-SR) 180 MG CR tablet      Facility-Administered Medications Prior to Visit  Medication Dose Route Frequency Provider Last Rate Last Dose  . fludeoxyglucose F - 18 (FDG) injection 7.66 milli Curie  7.66 milli Curie Intravenous Once PRN Medication Radiologist, MD   7.66 milli Curie at 12/09/14 0710       Review of Systems  Constitutional: Negative for fever and unexpected weight change.  HENT: Negative for congestion, dental problem, ear pain, nosebleeds, postnasal drip, rhinorrhea, sinus pressure, sneezing, sore throat and trouble swallowing.   Eyes: Negative for redness and itching.  Respiratory: Positive for cough, shortness of breath and wheezing. Negative for chest tightness.   Cardiovascular: Negative for palpitations and leg swelling.  Gastrointestinal: Negative for nausea and vomiting.  Genitourinary: Negative for dysuria.  Musculoskeletal: Negative for joint swelling.  Skin: Negative for rash.  Neurological: Negative for headaches.  Hematological: Does not bruise/bleed easily.  Psychiatric/Behavioral: Negative for dysphoric mood. The patient is not nervous/anxious.        Objective:   Physical Exam Filed Vitals:   12/11/14 1518  BP: 128/76  Height: '5\' 10"'$  (1.778 m)  Weight: 169 lb (76.658 kg)   Gen: well appearing, no acute distress HENT:  NCAT, OP clear, neck supple without masses Eyes: PERRL, EOMi Lymph: no cervical lymphadenopathy PULM: CTA B CV: RRR, no mgr, no JVD GI: BS+, soft, nontender, no hsm Derm: no rash or skin breakdown MSK: normal bulk and tone Neuro: A&Ox4, CN II-XII intact, strength 5/5 in  all 4 extremities Psyche: normal mood and affect   Case was discussed at length with his primary care physician Dr. Brigitte Pulse Images from the CT scan of his chest were personally reviewed, see my description below.      Assessment & Plan:  Lung mass Today in clinic we reviewed the images of his CT chest together which shows a well circumscribed mass approximately 3 cm in size in the left upper lobe. There is no associated lymphadenopathy any recent PET/CT scan did not show evidence of FDG activity outside of the mass. I explained to him today in clinic that the differential diagnosis includes malignancy but I suppose that with all of the respiratory infections he's had over the years (including most recently this summer) that there is a remote chance that this could represent some sort of an infection. That said, the most likely etiology is bronchogenic carcinoma.  I explained to him today that there is 2 ways to get a diagnosis here. We could either perform a navigational bronchoscopy to biopsy the mass or he could have a transthoracic needle aspiration. I explained the risks and benefits of both procedures indicating that a navigational bronchoscopy would require general anesthesia, but the risk of pneumothorax is higher with a transthoracic needle aspiration. He is absolutely not interested in any sort of "open chest surgery".  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 He prefers to discuss the case with his grandson after his grandson has reviewed the images.  Plan: He is going to get back with me after he has discussed the situation and the details with his grandson Depending on his conversation with his  grandson I will make arrangements for a navigational bronchoscopy or a transthoracic needle aspiration locally.     Current outpatient prescriptions:  .  apixaban (ELIQUIS) 5 MG TABS tablet, Take 1 tablet (5 mg total) by mouth 2 (two) times daily., Disp: , Rfl:  .  celecoxib (CELEBREX) 200 MG capsule, Take 200 mg by mouth daily.  , Disp: , Rfl:  .  Cetirizine HCl (ZYRTEC ALLERGY PO), Take 10 mg by mouth daily.  , Disp: , Rfl:  .  CRESTOR 10 MG tablet, , Disp: , Rfl:  .  finasteride (PROSCAR) 5 MG tablet, Take 5 mg by mouth daily.  , Disp: , Rfl:  .  fluticasone (FLONASE) 50 MCG/ACT nasal spray, Place 2 sprays into the nose daily.  , Disp: , Rfl:  .  Fluticasone-Salmeterol (ADVAIR DISKUS) 250-50 MCG/DOSE AEPB, Inhale 1 puff into the lungs 2 (two) times daily.  , Disp: , Rfl:  .  gabapentin (NEURONTIN) 300 MG capsule, , Disp: , Rfl:  .  Guaifenesin-Codeine (GUAIFENESIN AC PO), Take by mouth as needed. , Disp: , Rfl:  .  montelukast (SINGULAIR) 10 MG tablet, Take 10 mg by mouth at bedtime.  , Disp: , Rfl:  .  NON FORMULARY, allergy shots , Disp: , Rfl:  .  omeprazole (PRILOSEC) 20 MG capsule, Take 20 mg by mouth daily.  , Disp: , Rfl:  .  temazepam (RESTORIL) 15 MG capsule, Take 15 mg by mouth daily.  , Disp: , Rfl:  .  Tiotropium Bromide Monohydrate (SPIRIVA RESPIMAT) 2.5 MCG/ACT AERS, Inhale 2 puffs into the lungs daily., Disp: , Rfl:  .  verapamil (COVERA HS) 180 MG (CO) 24 hr tablet, Take 1 tablet (180 mg total) by mouth at bedtime., Disp: 30 tablet, Rfl: 5 .  XOPENEX HFA 45 MCG/ACT inhaler, , Disp: ,  Rfl:  .  zolpidem (AMBIEN) 10 MG tablet, Take 10 mg by mouth at bedtime as needed.  , Disp: , Rfl:  .  [DISCONTINUED] beclomethasone (QVAR) 80 MCG/ACT inhaler, Inhale 2 puffs into the lungs 2 (two) times daily., Disp: , Rfl:  No current facility-administered medications for this visit.  Facility-Administered Medications Ordered in Other Visits:  .  fludeoxyglucose F - 18 (FDG) injection 7.66  milli Curie, 7.66 milli Curie, Intravenous, Once PRN, Medication Radiologist, MD, 7.66 milli Curie at 12/09/14 0710

## 2014-12-11 NOTE — Assessment & Plan Note (Signed)
Today in clinic we reviewed the images of his CT chest together which shows a well circumscribed mass approximately 3 cm in size in the left upper lobe. There is no associated lymphadenopathy any recent PET/CT scan did not show evidence of FDG activity outside of the mass. I explained to him today in clinic that the differential diagnosis includes malignancy but I suppose that with all of the respiratory infections he's had over the years (including most recently this summer) that there is a remote chance that this could represent some sort of an infection. That said, the most likely etiology is bronchogenic carcinoma.  I explained to him today that there is 2 ways to get a diagnosis here. We could either perform a navigational bronchoscopy to biopsy the mass or he could have a transthoracic needle aspiration. I explained the risks and benefits of both procedures indicating that a navigational bronchoscopy would require general anesthesia, but the risk of pneumothorax is higher with a transthoracic needle aspiration. He is absolutely not interested in any sort of "open chest surgery".  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 He prefers to discuss the case with his grandson after his grandson has reviewed the images.  Plan: He is going to get back with me after he has discussed the situation and the details with his grandson Depending on his conversation with his grandson I will make arrangements for a navigational bronchoscopy or a transthoracic needle aspiration locally.

## 2014-12-12 ENCOUNTER — Ambulatory Visit (INDEPENDENT_AMBULATORY_CARE_PROVIDER_SITE_OTHER): Payer: Medicare Other | Admitting: Sports Medicine

## 2014-12-12 ENCOUNTER — Telehealth: Payer: Self-pay | Admitting: Pulmonary Disease

## 2014-12-12 ENCOUNTER — Encounter: Payer: Self-pay | Admitting: Sports Medicine

## 2014-12-12 VITALS — BP 137/66 | HR 61 | Ht 70.0 in | Wt 169.0 lb

## 2014-12-12 DIAGNOSIS — R918 Other nonspecific abnormal finding of lung field: Secondary | ICD-10-CM

## 2014-12-12 DIAGNOSIS — M7651 Patellar tendinitis, right knee: Secondary | ICD-10-CM

## 2014-12-12 NOTE — Progress Notes (Signed)
  [name] - 81 y.o. male MRN [id number]  Date of birth: 01-Aug-1924  SUBJECTIVE:  Including CC & ROS.  [name] is a 81 y.o. male who presents today for right knee pain.    Knee Pain right - patient states that he has had 1 day of anterior knee pain located distal to the joint line. He was pushing his wife in a wheelchair yesterday when he felt pain in the anterior aspect of his knee. He denies any popping tearing or acute swelling at that time. It is more of a dull ache with any type of knee extension. It has improved with ice and Celebrex since the injury.he is also been wearing a body helix sleeve to the knee which has helped quite a bit as well.  PMHx - Updated and reviewed.  Contributory factors include: tricompartmental osteoarthritis of the right knee, medial meniscus tear of the right knee. Supraspinatus tear of the right shoulder, compression fracture L1  PSHx - Updated and reviewed.  Contributory factors include:  Laminectomy, rotator cuff repair, colon resection FHx - Updated and reviewed.  Contributory factors include:  Non-contributory Medications - Celebrex, eliquis  Exam:  Filed Vitals:   12/12/14 1051  BP: 137/66  Pulse: 61    Gen: NAD Cardiorespiratory - Normal respiratory effort/rate.  RRR Right knee:  Normal to inspection with no erythema or effusion or obvious bony abnormalities.  No obvious Baker's cysts Tenderness palpation along the distal patella tendon. No medial lateral joint line tenderness ROM normal in flexion (135 degrees) and extension (0 degrees) and lower leg rotation. Negative meniscal testing Non painful patellar compression.  Normal Patellar glide.  No apprehension  Patellar and quadriceps tendons unremarkable. Hamstring and quadriceps strength is normal.  Neurovascularly intact B/L LE   Imaging:  Ultrasound showing right patellar tendon hypoechoic and hypoechoic changes consistent with calcific tendinopathy

## 2014-12-12 NOTE — Telephone Encounter (Signed)
Also, per BQ- pt needs to come off his Eliquis 2 days prior to the procedure.  Thanks!

## 2014-12-12 NOTE — Telephone Encounter (Signed)
Pt has ENB scheduled with dr byrum 12/18/14'@[facility]  and he is aware of this appt and been notified to stop blood thinner after Sunday [name]

## 2014-12-12 NOTE — Telephone Encounter (Signed)
Spoke with BQ, who spoke with pt.  Pt has decided that he would like to proceed with a navigational bronch.  BQ is talking to RB about performing this next week.  I've requested a Super-D disk of pt's CT chest on 9/2 which will be sent at lunch today via courier from [place] to our office-at [facility] attention.   Per BQ pt needs to have a CBC c diff drawn before procedure.  Pt aware of this and will have this drawn this week.   Order placed for labs and nav. Bronch.   Forwarding to [facility] to make aware.

## 2014-12-12 NOTE — Assessment & Plan Note (Signed)
Ultrasound shows chronic changes consistent with tendinopathy including ill-defined hypoechoic heterogeneous regions of the distal patella tendon along with some calcific tendinosis. Continue Tylenol and Celebrex when necessary for pain. Continue with ice and body helix sleeve pain for pain. Can consider eccentric exercises versus nitroglycerin patch if no improvement in 6-8 weeks.

## 2014-12-17 ENCOUNTER — Encounter (HOSPITAL_COMMUNITY): Payer: Self-pay | Admitting: *Deleted

## 2014-12-17 DIAGNOSIS — J3089 Other allergic rhinitis: Secondary | ICD-10-CM | POA: Diagnosis not present

## 2014-12-17 DIAGNOSIS — J301 Allergic rhinitis due to pollen: Secondary | ICD-10-CM | POA: Diagnosis not present

## 2014-12-17 NOTE — Progress Notes (Signed)
Anesthesia Chart Review:  Pt is 81 year old male scheduled for LLL video bronchoscopy with endobronchial navigation on 12/18/2014 with Dr. Lamonte Sakai.   Pt is a same day work up.   PCP is Dr. [name].   PMH includes: atrial fibrillation, HTN, Loeffler syndrome, COPD, asthma, bradycardia, palpitations. Former smoker. BMI 24.   Medications include: eliquis, crestor, advair, methylprednisone, prilosec, spiriva, verapamil, xopenex.   CBC, CMET, PT, PTT will be obtained DOS.   EKG to be obtained DOS. EKG in 2015 showed rate controlled atrial fibrillation.   Echo 08/31/2007: - False chord noted in LV. Overall left ventricular systolic function was normal. Left ventricular wall thickness was mildly increased. - There was mild aortic root dilatation. - There was mild mitral valvular regurgitation.  If EKG and labs stable DOS, I anticipate pt can proceed with surgery as scheduled.   Willeen Cass, FNP-BC [facility]/Anesthesiology Phone: [phone] 12/17/2014 1:38 PM

## 2014-12-17 NOTE — Progress Notes (Signed)
Pt denies any recent chest pain or sob. Hx of A-fib, has seen Dr. Lovena Le in the past, pt states that Dr. Lovena Le has released him to his PCP, Dr. [name].

## 2014-12-18 ENCOUNTER — Ambulatory Visit (HOSPITAL_COMMUNITY): Payer: Medicare Other

## 2014-12-18 ENCOUNTER — Ambulatory Visit (HOSPITAL_COMMUNITY): Payer: Medicare Other | Admitting: Emergency Medicine

## 2014-12-18 ENCOUNTER — Ambulatory Visit (HOSPITAL_COMMUNITY)
Admission: RE | Admit: 2014-12-18 | Discharge: 2014-12-18 | Disposition: A | Discharge status: Home / Self Care | Payer: Medicare Other | Source: Ambulatory Visit | Attending: Emergency Medicine | Admitting: Emergency Medicine

## 2014-12-18 ENCOUNTER — Encounter (HOSPITAL_COMMUNITY): Payer: Self-pay | Admitting: Certified Registered Nurse Anesthetist

## 2014-12-18 ENCOUNTER — Encounter (HOSPITAL_COMMUNITY)
Admission: RE | Disposition: A | Discharge status: Home / Self Care | Payer: Self-pay | Source: Ambulatory Visit | Attending: Emergency Medicine

## 2014-12-18 DIAGNOSIS — C3432 Malignant neoplasm of lower lobe, left bronchus or lung: Secondary | ICD-10-CM | POA: Diagnosis not present

## 2014-12-18 DIAGNOSIS — Z419 Encounter for procedure for purposes other than remedying health state, unspecified: Secondary | ICD-10-CM

## 2014-12-18 DIAGNOSIS — D1432 Benign neoplasm of left bronchus and lung: Secondary | ICD-10-CM | POA: Insufficient documentation

## 2014-12-18 DIAGNOSIS — J45909 Unspecified asthma, uncomplicated: Secondary | ICD-10-CM | POA: Diagnosis not present

## 2014-12-18 DIAGNOSIS — Z7901 Long term (current) use of anticoagulants: Secondary | ICD-10-CM | POA: Insufficient documentation

## 2014-12-18 DIAGNOSIS — I4891 Unspecified atrial fibrillation: Secondary | ICD-10-CM | POA: Diagnosis not present

## 2014-12-18 DIAGNOSIS — Z9889 Other specified postprocedural states: Secondary | ICD-10-CM | POA: Diagnosis not present

## 2014-12-18 DIAGNOSIS — J449 Chronic obstructive pulmonary disease, unspecified: Secondary | ICD-10-CM | POA: Insufficient documentation

## 2014-12-18 DIAGNOSIS — M1990 Unspecified osteoarthritis, unspecified site: Secondary | ICD-10-CM | POA: Diagnosis not present

## 2014-12-18 DIAGNOSIS — Z87891 Personal history of nicotine dependence: Secondary | ICD-10-CM | POA: Diagnosis not present

## 2014-12-18 DIAGNOSIS — R918 Other nonspecific abnormal finding of lung field: Secondary | ICD-10-CM | POA: Diagnosis not present

## 2014-12-18 DIAGNOSIS — I10 Essential (primary) hypertension: Secondary | ICD-10-CM | POA: Insufficient documentation

## 2014-12-18 DIAGNOSIS — C3491 Malignant neoplasm of unspecified part of right bronchus or lung: Secondary | ICD-10-CM | POA: Diagnosis present

## 2014-12-18 DIAGNOSIS — Z01811 Encounter for preprocedural respiratory examination: Secondary | ICD-10-CM

## 2014-12-18 HISTORY — DX: Other pulmonary eosinophilia, not elsewhere classified: J82.89

## 2014-12-18 HISTORY — PX: VIDEO BRONCHOSCOPY WITH ENDOBRONCHIAL NAVIGATION: SHX6175

## 2014-12-18 HISTORY — DX: Pulmonary eosinophilia, not elsewhere classified: J82

## 2014-12-18 LAB — CBC
HEMATOCRIT: 32.1 % — AB (ref 39.0–52.0)
HEMOGLOBIN: 10.7 g/dL — AB (ref 13.0–17.0)
MCH: 31 pg (ref 26.0–34.0)
MCHC: 33.3 g/dL (ref 30.0–36.0)
MCV: 93 fL (ref 78.0–100.0)
Platelets: 152 10*3/uL (ref 150–400)
RBC: 3.45 MIL/uL — AB (ref 4.22–5.81)
RDW: 15.4 % (ref 11.5–15.5)
WBC: 5.7 10*3/uL (ref 4.0–10.5)

## 2014-12-18 LAB — COMPREHENSIVE METABOLIC PANEL
ALBUMIN: 3.3 g/dL — AB (ref 3.5–5.0)
ALT: 17 U/L (ref 17–63)
ANION GAP: 6 (ref 5–15)
AST: 26 U/L (ref 15–41)
Alkaline Phosphatase: 58 U/L (ref 38–126)
BILIRUBIN TOTAL: 1 mg/dL (ref 0.3–1.2)
BUN: 14 mg/dL (ref 6–20)
CALCIUM: 9.2 mg/dL (ref 8.9–10.3)
CO2: 26 mmol/L (ref 22–32)
Chloride: 103 mmol/L (ref 101–111)
Creatinine, Ser: 0.95 mg/dL (ref 0.61–1.24)
Glucose, Bld: 96 mg/dL (ref 65–99)
POTASSIUM: 4.3 mmol/L (ref 3.5–5.1)
Sodium: 135 mmol/L (ref 135–145)
TOTAL PROTEIN: 5.6 g/dL — AB (ref 6.5–8.1)

## 2014-12-18 LAB — PROTIME-INR
INR: 1.33 (ref 0.00–1.49)
PROTHROMBIN TIME: 16.6 s — AB (ref 11.6–15.2)

## 2014-12-18 LAB — APTT: APTT: 34 s (ref 24–37)

## 2014-12-18 SURGERY — VIDEO BRONCHOSCOPY WITH ENDOBRONCHIAL NAVIGATION
Anesthesia: General | Site: Chest

## 2014-12-18 MED ORDER — 0.9 % SODIUM CHLORIDE (POUR BTL) OPTIME
TOPICAL | Status: DC | PRN
  Administered 2014-12-18: 1000 mL

## 2014-12-18 MED ORDER — PROMETHAZINE HCL 25 MG/ML IJ SOLN: 6.2500 mg | INTRAMUSCULAR | Status: DC | PRN

## 2014-12-18 MED ORDER — PROPOFOL 10 MG/ML IV BOLUS
INTRAVENOUS | Status: DC | PRN
  Administered 2014-12-18: 120 mg via INTRAVENOUS
  Administered 2014-12-18: 20 mg via INTRAVENOUS

## 2014-12-18 MED ORDER — MIDAZOLAM HCL 2 MG/2ML IJ SOLN: 0.5000 mg | Freq: Once | INTRAMUSCULAR | Status: DC | PRN

## 2014-12-18 MED ORDER — ALBUTEROL SULFATE (2.5 MG/3ML) 0.083% IN NEBU: 2.5000 mg | INHALATION_SOLUTION | Freq: Four times a day (QID) | RESPIRATORY_TRACT | Status: DC

## 2014-12-18 MED ORDER — FENTANYL CITRATE (PF) 100 MCG/2ML IJ SOLN
INTRAMUSCULAR | Status: DC | PRN
  Administered 2014-12-18: 50 ug via INTRAVENOUS

## 2014-12-18 MED ORDER — SUGAMMADEX SODIUM 200 MG/2ML IV SOLN
INTRAVENOUS | Status: AC
  Filled 2014-12-18: qty 2

## 2014-12-18 MED ORDER — FENTANYL CITRATE (PF) 250 MCG/5ML IJ SOLN
INTRAMUSCULAR | Status: AC
  Filled 2014-12-18: qty 5

## 2014-12-18 MED ORDER — SUGAMMADEX SODIUM 200 MG/2ML IV SOLN
INTRAVENOUS | Status: DC | PRN
  Administered 2014-12-18: 153.4 mg via INTRAVENOUS

## 2014-12-18 MED ORDER — FENTANYL CITRATE (PF) 100 MCG/2ML IJ SOLN: 25.0000 ug | INTRAMUSCULAR | Status: DC | PRN

## 2014-12-18 MED ORDER — ONDANSETRON HCL 4 MG/2ML IJ SOLN
INTRAMUSCULAR | Status: DC | PRN
  Administered 2014-12-18: 4 mg via INTRAVENOUS

## 2014-12-18 MED ORDER — LIDOCAINE HCL (CARDIAC) 20 MG/ML IV SOLN
INTRAVENOUS | Status: DC | PRN
  Administered 2014-12-18: 30 mg via INTRAVENOUS

## 2014-12-18 MED ORDER — MEPERIDINE HCL 25 MG/ML IJ SOLN: 6.2500 mg | INTRAMUSCULAR | Status: DC | PRN

## 2014-12-18 MED ORDER — LACTATED RINGERS IV SOLN
INTRAVENOUS | Status: DC
  Administered 2014-12-18: 09:00:00 via INTRAVENOUS

## 2014-12-18 MED ORDER — LEVALBUTEROL TARTRATE 45 MCG/ACT IN AERO: 2.0000 | INHALATION_SPRAY | Freq: Four times a day (QID) | RESPIRATORY_TRACT | Status: DC

## 2014-12-18 MED ORDER — ROCURONIUM BROMIDE 100 MG/10ML IV SOLN
INTRAVENOUS | Status: DC | PRN
  Administered 2014-12-18: 10 mg via INTRAVENOUS
  Administered 2014-12-18: 40 mg via INTRAVENOUS

## 2014-12-18 MED ORDER — PHENYLEPHRINE HCL 10 MG/ML IJ SOLN
INTRAMUSCULAR | Status: DC | PRN
  Administered 2014-12-18: 40 ug via INTRAVENOUS
  Administered 2014-12-18 (×2): 80 ug via INTRAVENOUS
  Administered 2014-12-18 (×3): 40 ug via INTRAVENOUS
  Administered 2014-12-18: 80 ug via INTRAVENOUS

## 2014-12-18 SURGICAL SUPPLY — 40 items
BRUSH BIOPSY BRONCH 10 SDTNB (MISCELLANEOUS) ×1 IMPLANT
BRUSH BIOPSY BRONCH 10MM SDTNB (MISCELLANEOUS) ×1
BRUSH CYTOL CELLEBRITY 1.5X140 (MISCELLANEOUS) ×4 IMPLANT
BRUSH SUPERTRAX BIOPSY (INSTRUMENTS) IMPLANT
BRUSH SUPERTRAX NDL-TIP CYTO (INSTRUMENTS) ×2 IMPLANT
CANISTER SUCTION 2500CC (MISCELLANEOUS) ×3 IMPLANT
CHANNEL WORK EXTEND EDGE 180 (KITS) IMPLANT
CHANNEL WORK EXTEND EDGE 45 (KITS) IMPLANT
CHANNEL WORK EXTEND EDGE 90 (KITS) IMPLANT
CONT SPEC 4OZ CLIKSEAL STRL BL (MISCELLANEOUS) ×5 IMPLANT
COVER TABLE BACK 60X90 (DRAPES) ×3 IMPLANT
FILTER STRAW FLUID ASPIR (MISCELLANEOUS) IMPLANT
FORCEPS BIOP SUPERTRX PREMAR (INSTRUMENTS) ×2 IMPLANT
GAUZE SPONGE 4X4 12PLY STRL (GAUZE/BANDAGES/DRESSINGS) ×3 IMPLANT
GLOVE BIO SURGEON STRL SZ7.5 (GLOVE) ×6 IMPLANT
GLOVE BIOGEL PI IND STRL 6.5 (GLOVE) IMPLANT
GLOVE BIOGEL PI INDICATOR 6.5 (GLOVE) ×2
GLOVE SURG SS PI 7.0 STRL IVOR (GLOVE) ×2 IMPLANT
KIT CLEAN ENDO COMPLIANCE (KITS) ×3 IMPLANT
KIT LOCATABLE GUIDE (CANNULA) IMPLANT
KIT MARKER FIDUCIAL DELIVERY (KITS) IMPLANT
KIT PROCEDURE EDGE 180 (KITS) IMPLANT
KIT PROCEDURE EDGE 45 (KITS) IMPLANT
KIT PROCEDURE EDGE 90 (KITS) ×2 IMPLANT
KIT ROOM TURNOVER OR (KITS) ×3 IMPLANT
MARKER SKIN DUAL TIP RULER LAB (MISCELLANEOUS) ×3 IMPLANT
NDL SUPERTRX PREMARK BIOPSY (NEEDLE) IMPLANT
NEEDLE SUPERTRX PREMARK BIOPSY (NEEDLE) ×3 IMPLANT
NS IRRIG 1000ML POUR BTL (IV SOLUTION) ×3 IMPLANT
OIL SILICONE PENTAX (PARTS (SERVICE/REPAIRS)) ×3 IMPLANT
PAD ARMBOARD 7.5X6 YLW CONV (MISCELLANEOUS) ×6 IMPLANT
PATCHES PATIENT (LABEL) ×3 IMPLANT
SYR 20CC LL (SYRINGE) ×3 IMPLANT
SYR 20ML ECCENTRIC (SYRINGE) ×3 IMPLANT
SYR 50ML SLIP (SYRINGE) ×3 IMPLANT
SYSTEM GENCUT CORE BIOPSY (NEEDLE) ×2 IMPLANT
TOWEL OR 17X24 6PK STRL BLUE (TOWEL DISPOSABLE) ×3 IMPLANT
TRAP SPECIMEN MUCOUS 40CC (MISCELLANEOUS) IMPLANT
TUBE CONNECTING 20'X1/4 (TUBING) ×1
TUBE CONNECTING 20X1/4 (TUBING) ×2 IMPLANT

## 2014-12-18 NOTE — Anesthesia Postprocedure Evaluation (Signed)
  Anesthesia Post-op Note  Patient: [name]  Procedure(s) Performed: Procedure(s): VIDEO BRONCHOSCOPY WITH ENDOBRONCHIAL NAVIGATION  (N/A)  Patient Location: PACU  Anesthesia Type:General  Level of Consciousness: awake, alert , oriented and patient cooperative  Airway and Oxygen Therapy: Patient Spontanous Breathing  Post-op Pain: none  Post-op Assessment: Post-op Vital signs reviewed, Patient's Cardiovascular Status Stable, Respiratory Function Stable, Patent Airway, No signs of Nausea or vomiting and Pain level controlled              Post-op Vital Signs: Reviewed and stable  Last Vitals:  Filed Vitals:   12/18/14 1210  BP:   Pulse: 59  Temp:   Resp: 13    Complications: No apparent anesthesia complications

## 2014-12-18 NOTE — Progress Notes (Addendum)
Inhaler had not arrived from pharmacy before ready in the OR.  Informed Company secretary of need for 2 puffs and she said she could get it in the back.   (Xopenex inhaler)

## 2014-12-18 NOTE — Transfer of Care (Signed)
Immediate Anesthesia Transfer of Care Note  Patient: [name]  Procedure(s) Performed: Procedure(s): VIDEO BRONCHOSCOPY WITH ENDOBRONCHIAL NAVIGATION  (N/A)  Patient Location: PACU  Anesthesia Type:General  Level of Consciousness: awake, alert , oriented and patient cooperative  Airway & Oxygen Therapy: Patient Spontanous Breathing and Patient connected to face mask oxygen  Post-op Assessment: Report given to RN, Post -op Vital signs reviewed and stable and Patient moving all extremities X 4  Post vital signs: Reviewed and stable  Last Vitals:  Filed Vitals:   12/18/14 0806  BP: 128/64  Pulse: 65  Temp: 36.6 C  Resp: 20    Complications: No apparent anesthesia complications

## 2014-12-18 NOTE — H&P (View-Only) (Signed)
Subjective:    Patient ID: [name], male    DOB: 1924/08/13, 81 y.o.   MRN: [id number]  HPI Chief Complaint  Patient presents with  . Advice Only    referred by Dr. Brigitte Pulse for possible lung ca.  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 He has a history of Loeffler syndrome this experienced repeated episodes of chest infections and various respiratory complaints over the years. These eventrated off and on with prednisone and at times antibiotics. He notes that he smoked about 2-1/2 packs of cigarettes daily for approximately 12 years and quit in 1957.   This summer he said that he was experiencing a cough which she attributed to his typical respiratory complaints and allergies. However, he required 3 rounds of antibiotics and prednisone so he came in to see his physician with the third round to have a chest x-ray. This showed an abnormality in the left lung which led to a CT scan of his chest. This demonstrated a 3 cm lesion in the left upper lobe. A follow-up PET/CT scan showed increased FDG activity in the nodule but no other activity elsewhere.  He says that his cough has resolved. He has actually gained weight recently and not lost weight. He does have some leg swelling which he attributes to the weight gain. He does not have any hemoptysis. He denies chest pain. He does not have fevers or chills.   Past Medical History  Diagnosis Date  . Bradycardia   . Palpitations   . Asthma   . Hemorrhoids   . Hx of adenomatous colonic polyps 02/1997  . Atrial fibrillation   . Hypertension   . Pulmonary aspergillosis   . Diverticulosis of colon   . COPD (chronic obstructive pulmonary disease)     mild  . Bowel obstruction      Family History  Problem Relation Age of Onset  . Lung cancer Father 65  . Stroke Mother 66  . Leukemia Brother   . Coronary artery disease Brother   . Heart  failure Brother      Social History   Social History  . Marital Status: Married    Spouse Name: N/A  . Number of Children: N/A  . Years of Education: N/A   Occupational History  . retired physician    Social History Main Topics  . Smoking status: Former Smoker -- 2.00 packs/day for 13 years    Types: Cigarettes    Quit date: 03/29/1956  . Smokeless tobacco: Never Used  . Alcohol Use: No     Comment: Quit drinking EtOH in 2012.  Used to drink on weekends 2-3 bottles of wine.  . Drug Use: No  . Sexual Activity: Not on file   Other Topics Concern  . Not on file   Social History Narrative     Allergies  Allergen Reactions  . Sulfonamide Derivatives      Outpatient Prescriptions Prior to Visit  Medication Sig Dispense Refill  . apixaban (ELIQUIS) 5 MG TABS tablet Take 1 tablet (5 mg total) by mouth 2 (two) times daily.    . celecoxib (CELEBREX) 200 MG capsule Take 200 mg by mouth daily.      . Cetirizine HCl (ZYRTEC ALLERGY PO) Take 10 mg by mouth daily.      . CRESTOR 10 MG tablet     . finasteride (  PROSCAR) 5 MG tablet Take 5 mg by mouth daily.      . fluticasone (FLONASE) 50 MCG/ACT nasal spray Place 2 sprays into the nose daily.      . Fluticasone-Salmeterol (ADVAIR DISKUS) 250-50 MCG/DOSE AEPB Inhale 1 puff into the lungs 2 (two) times daily.      Marland Kitchen gabapentin (NEURONTIN) 300 MG capsule     . Guaifenesin-Codeine (GUAIFENESIN AC PO) Take by mouth as needed.     . montelukast (SINGULAIR) 10 MG tablet Take 10 mg by mouth at bedtime.      . NON FORMULARY allergy shots     . omeprazole (PRILOSEC) 20 MG capsule Take 20 mg by mouth daily.      . temazepam (RESTORIL) 15 MG capsule Take 15 mg by mouth daily.      . verapamil (COVERA HS) 180 MG (CO) 24 hr tablet Take 1 tablet (180 mg total) by mouth at bedtime. 30 tablet 5  . XOPENEX HFA 45 MCG/ACT inhaler     . zolpidem (AMBIEN) 10 MG tablet Take 10 mg by mouth at bedtime as needed.      . B Complex Vitamins (VITAMIN-B  COMPLEX PO) 1 tab po qd     . Calcium 600-200 MG-UNIT per tablet Take 1 tablet by mouth daily.      . cholecalciferol (VITAMIN D) 400 UNITS TABS Take 1,000 Units by mouth.      . cyclobenzaprine (FLEXERIL) 10 MG tablet Take 10 mg by mouth at bedtime as needed.    Marland Kitchen FINACEA 15 % cream as needed.    . gabapentin (NEURONTIN) 100 MG capsule Take 1 capsule (100 mg total) by mouth at bedtime. (Patient not taking: Reported on 11/07/2014) 30 capsule 5  . levofloxacin (LEVAQUIN) 500 MG tablet     . methylPREDNISolone (MEDROL DOSEPAK) 4 MG TBPK tablet     . rosuvastatin (CRESTOR) 5 MG tablet Take 5 mg by mouth daily.      . verapamil (CALAN-SR) 180 MG CR tablet      Facility-Administered Medications Prior to Visit  Medication Dose Route Frequency Provider Last Rate Last Dose  . fludeoxyglucose F - 18 (FDG) injection 7.66 milli Curie  7.66 milli Curie Intravenous Once PRN Medication Radiologist, MD   7.66 milli Curie at 12/09/14 0710       Review of Systems  Constitutional: Negative for fever and unexpected weight change.  HENT: Negative for congestion, dental problem, ear pain, nosebleeds, postnasal drip, rhinorrhea, sinus pressure, sneezing, sore throat and trouble swallowing.   Eyes: Negative for redness and itching.  Respiratory: Positive for cough, shortness of breath and wheezing. Negative for chest tightness.   Cardiovascular: Negative for palpitations and leg swelling.  Gastrointestinal: Negative for nausea and vomiting.  Genitourinary: Negative for dysuria.  Musculoskeletal: Negative for joint swelling.  Skin: Negative for rash.  Neurological: Negative for headaches.  Hematological: Does not bruise/bleed easily.  Psychiatric/Behavioral: Negative for dysphoric mood. The patient is not nervous/anxious.        Objective:   Physical Exam Filed Vitals:   12/11/14 1518  BP: 128/76  Height: '5\' 10"'$  (1.778 m)  Weight: 169 lb (76.658 kg)   Gen: well appearing, no acute distress HENT:  NCAT, OP clear, neck supple without masses Eyes: PERRL, EOMi Lymph: no cervical lymphadenopathy PULM: CTA B CV: RRR, no mgr, no JVD GI: BS+, soft, nontender, no hsm Derm: no rash or skin breakdown MSK: normal bulk and tone Neuro: A&Ox4, CN II-XII intact, strength 5/5 in  all 4 extremities Psyche: normal mood and affect   Case was discussed at length with his primary care physician Dr. Brigitte Pulse Images from the CT scan of his chest were personally reviewed, see my description below.      Assessment & Plan:  Lung mass Today in clinic we reviewed the images of his CT chest together which shows a well circumscribed mass approximately 3 cm in size in the left upper lobe. There is no associated lymphadenopathy any recent PET/CT scan did not show evidence of FDG activity outside of the mass. I explained to him today in clinic that the differential diagnosis includes malignancy but I suppose that with all of the respiratory infections he's had over the years (including most recently this summer) that there is a remote chance that this could represent some sort of an infection. That said, the most likely etiology is bronchogenic carcinoma.  I explained to him today that there is 2 ways to get a diagnosis here. We could either perform a navigational bronchoscopy to biopsy the mass or he could have a transthoracic needle aspiration. I explained the risks and benefits of both procedures indicating that a navigational bronchoscopy would require general anesthesia, but the risk of pneumothorax is higher with a transthoracic needle aspiration. He is absolutely not interested in any sort of "open chest surgery".  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 He prefers to discuss the case with his grandson after his grandson has reviewed the images.  Plan: He is going to get back with me after he has discussed the situation and the details with his grandson Depending on his conversation with his  grandson I will make arrangements for a navigational bronchoscopy or a transthoracic needle aspiration locally.     Current outpatient prescriptions:  .  apixaban (ELIQUIS) 5 MG TABS tablet, Take 1 tablet (5 mg total) by mouth 2 (two) times daily., Disp: , Rfl:  .  celecoxib (CELEBREX) 200 MG capsule, Take 200 mg by mouth daily.  , Disp: , Rfl:  .  Cetirizine HCl (ZYRTEC ALLERGY PO), Take 10 mg by mouth daily.  , Disp: , Rfl:  .  CRESTOR 10 MG tablet, , Disp: , Rfl:  .  finasteride (PROSCAR) 5 MG tablet, Take 5 mg by mouth daily.  , Disp: , Rfl:  .  fluticasone (FLONASE) 50 MCG/ACT nasal spray, Place 2 sprays into the nose daily.  , Disp: , Rfl:  .  Fluticasone-Salmeterol (ADVAIR DISKUS) 250-50 MCG/DOSE AEPB, Inhale 1 puff into the lungs 2 (two) times daily.  , Disp: , Rfl:  .  gabapentin (NEURONTIN) 300 MG capsule, , Disp: , Rfl:  .  Guaifenesin-Codeine (GUAIFENESIN AC PO), Take by mouth as needed. , Disp: , Rfl:  .  montelukast (SINGULAIR) 10 MG tablet, Take 10 mg by mouth at bedtime.  , Disp: , Rfl:  .  NON FORMULARY, allergy shots , Disp: , Rfl:  .  omeprazole (PRILOSEC) 20 MG capsule, Take 20 mg by mouth daily.  , Disp: , Rfl:  .  temazepam (RESTORIL) 15 MG capsule, Take 15 mg by mouth daily.  , Disp: , Rfl:  .  Tiotropium Bromide Monohydrate (SPIRIVA RESPIMAT) 2.5 MCG/ACT AERS, Inhale 2 puffs into the lungs daily., Disp: , Rfl:  .  verapamil (COVERA HS) 180 MG (CO) 24 hr tablet, Take 1 tablet (180 mg total) by mouth at bedtime., Disp: 30 tablet, Rfl: 5 .  XOPENEX HFA 45 MCG/ACT inhaler, , Disp: ,  Rfl:  .  zolpidem (AMBIEN) 10 MG tablet, Take 10 mg by mouth at bedtime as needed.  , Disp: , Rfl:  .  [DISCONTINUED] beclomethasone (QVAR) 80 MCG/ACT inhaler, Inhale 2 puffs into the lungs 2 (two) times daily., Disp: , Rfl:  No current facility-administered medications for this visit.  Facility-Administered Medications Ordered in Other Visits:  .  fludeoxyglucose F - 18 (FDG) injection 7.66  milli Curie, 7.66 milli Curie, Intravenous, Once PRN, Medication Radiologist, MD, 7.66 milli Curie at 12/09/14 0710

## 2014-12-18 NOTE — Op Note (Signed)
Video Bronchoscopy with Electromagnetic Navigation Procedure Note  Date of Operation: 12/18/2014  Pre-op Diagnosis: LLL Mass  Post-op Diagnosis: same  Surgeon: Baltazar Apo  Assistants: none  Anesthesia: General endotracheal anesthesia  Operation: Flexible video fiberoptic bronchoscopy with electromagnetic navigation and biopsies.  Estimated Blood Loss: Minimal  Complications: none apparent  Indications and History: [name] is a 81 y.o. male with hx of former tobacco use and enlarging, PET positive LLL Mass. Recommendation was made to seek tissue diagnosis via navigational bronchoscopy.  The risks, benefits, complications, treatment options and expected outcomes were discussed with the patient.  The possibilities of pneumothorax, pneumonia, reaction to medication, pulmonary aspiration, perforation of a viscus, bleeding, failure to diagnose a condition and creating a complication requiring transfusion or operation were discussed with the patient who freely signed the consent.    Description of Procedure: The patient was seen in the Preoperative Area, was examined and was deemed appropriate to proceed.  The patient was taken to [facility], identified as Joneen Boers R [facility] and the procedure verified as Flexible Video Fiberoptic Bronchoscopy.  A Time Out was held and the above information confirmed.   Prior to the date of the procedure a high-resolution CT scan of the chest was performed. Utilizing [place] a virtual tracheobronchial tree was generated to allow the creation of distinct navigation pathways to the patient's LLL parenchymal abnormality. After being taken to the operating room general anesthesia was initiated and the patient  was orally intubated. The video fiberoptic bronchoscope was introduced via the endotracheal tube and a general inspection was performed which showed normal airways with the exception of some narrowing of the left lower lobe superior segmental  bronchi and also a misshapen right middle lobe bronchus with some protruding cartilage. No overt endobronchial lesions were seen. 2 separate right mainstem bronchus endobronchial brushings were obtained to be sent for Percepta testing. The extendable working channel and locator guide were introduced into the bronchoscope. The distinct navigation pathways prepared prior to this procedure were then utilized to navigate to within 0.5-1.5 of patient's lesion identified on CT scan. The extendable working channel was secured into place and the locator guide was withdrawn. Under fluoroscopic guidance transbronchial needle brushings, triple brushings, transbronchial Wang needle biopsies, transbronchial GenCut biopsies and transbronchial forceps biopsies were performed to be sent for cytology and pathology. A bronchioalveolar lavage was performed in the left lower lobe and sent for cytology.. At the end of the procedure a general airway inspection was performed and there was no evidence of active bleeding. The bronchoscope was removed.  The patient tolerated the procedure well. There was no significant blood loss and there were no obvious complications. A post-procedural chest x-ray is pending.  Samples: 1. Transbronchial needle brushings and triple brushings from left lower lobe mass 2. Transbronchial Wang needle biopsies and GenCut biopsies from left lower lobe mass 3. Transbronchial forceps biopsies from left lower lobe mass 4. Bronchoalveolar lavage from left lower lobe 5. Endobronchial brushings from right mainstem bronchus for Percepta testing.   Plans:  The patient will be discharged from the PACU to home when recovered from anesthesia and after chest x-ray is reviewed. We will review the cytology, pathology results with the patient when they become available. Outpatient followup will be with Dr Lake Bells or Dr Lamonte Sakai   Baltazar Apo, MD, PhD 12/18/2014, 11:50 AM [facility] [phone]  or if no answer [phone]

## 2014-12-18 NOTE — OR Nursing (Signed)
Right mainstem bronchus for endobronchial brushing sent for genetic testing taken by Dr. Lamonte Sakai.

## 2014-12-18 NOTE — Research (Signed)
Prospective Registry to Evaluate Percepta Bronchial Genomic Classifier Patient Data: [name].  This patient, [name], consented to the above clinical trial according to FDA regulations, GCP guidelines and [facility] SOPs. The study design has been explained to this patient and the patient demonstrated comprehension. No study procedures have been initiated before consenting of this patient. This patient was given sufficient time for reading the consent and asking questions. All risks, benefits and options have been thoroughly discussed with this RN and Dr. Lamonte Sakai. This patient was not coerced in any way to participate or to continue participation in this clinical trial. The patient has signed voluntarily at 09:15am on December 18, 2014. This patient was given a copy of this consent.  Daughter, Eustaquio Maize, was present for the informed consent process  Doreatha Martin, RN [phone]

## 2014-12-18 NOTE — Interval H&P Note (Signed)
PCCM Interval Note  S: [name] presents today for further eval of his LLL mass. He reports that he has been feeling well, has some cough and wheeze but no hemoptysis. He was started on Spiriva by Dr Lake Bells and believes that he is benefiting from it.  His daughter [name] is with him today.   Filed Vitals:   12/18/14 0806  BP: 128/64  Pulse: 65  Temp: 97.9 F (36.6 C)  TempSrc: Oral  Resp: 20  Height: '5\' 10"'$  (1.778 m)  Weight: 76.658 kg (169 lb)  SpO2: 99%   Gen: Pleasant, well-nourished, in no distress,  normal affect  ENT: No lesions,  mouth clear,  oropharynx clear, no postnasal drip  Neck: No JVD, no TMG, no carotid bruits  Lungs: No use of accessory muscles, clear on L, there is a late R mid lung exp squeak  Cardiovascular: RRR, heart sounds normal, no peripheral edema  Musculoskeletal: No deformities, no cyanosis or clubbing  Neuro: alert, non focal    PET 12/09/14 --  COMPARISON: Chest CT on 11/29/2014  FINDINGS: NECK  No hypermetabolic lymph nodes in the neck.  CHEST  An irregular pulmonary nodule seen in the superior segment left lower lobe measuring 3.1 cm in maximum diameter. This is hypermetabolic with SUV max of 01.7. A 4 mm pulmonary nodule in the right lower lobe on image 46 of series 6 and 6 mm pulmonary nodule in right upper lobe on image 31 of series 6 show no metabolic activity, but are too small to characterize by PET. Right upper lobe bronchiectasis again noted.  No hypermetabolic hilar or mediastinal lymph nodes identified.  ABDOMEN/PELVIS  No abnormal hypermetabolic activity within the liver, pancreas, adrenal glands, or spleen. No hypermetabolic lymph nodes in the abdomen or pelvis.  Simple appearing right renal cyst noted. Atherosclerotic calcification of aorta noted. Right hip prosthesis also demonstrated.  SKELETON  No focal hypermetabolic activity to suggest skeletal metastasis.  IMPRESSION: 3.1 cm hypermetabolic  mass in superior segment of left lower lobe, consistent with bronchogenic carcinoma.  No evidence of thoracic nodal or distant metastatic disease.  Tiny sub-cm indeterminate right lung nodules. Continued attention recommended on follow-up CT.   Impression / Plan:  LLL superior segmental mass, hypermetabolic on PET, high pre-test probability for malignancy. Plan to obtain tissue via navigational FOB. Will also send Percepta testing for risk stratification in event of a negative biopsy.   Baltazar Apo, MD, PhD 12/18/2014, 9:35 AM [facility] [phone] or if no answer [phone]

## 2014-12-18 NOTE — Anesthesia Procedure Notes (Signed)
Procedure Name: Intubation Date/Time: 12/18/2014 10:03 AM Performed by: Rogers Blocker Pre-anesthesia Checklist: Patient identified, Timeout performed, Emergency Drugs available, Suction available and Patient being monitored Patient Re-evaluated:Patient Re-evaluated prior to inductionOxygen Delivery Method: Circle system utilized Preoxygenation: Pre-oxygenation with 100% oxygen Intubation Type: IV induction Ventilation: Mask ventilation without difficulty Laryngoscope Size: Glidescope and 4 Grade View: Grade I Tube type: Oral Tube size: 8.5 mm Number of attempts: 1 Airway Equipment and Method: Video-laryngoscopy,  Stylet and Rigid stylet Placement Confirmation: ETT inserted through vocal cords under direct vision,  positive ETCO2,  CO2 detector and breath sounds checked- equal and bilateral Secured at: 21 cm Tube secured with: Tape Dental Injury: Teeth and Oropharynx as per pre-operative assessment  Difficulty Due To: Difficulty was anticipated and Difficult Airway- due to immobile epiglottis Future Recommendations: Recommend- induction with short-acting agent, and alternative techniques readily available Comments: Grade 3 view with Miller 2 and with Mac 3. Unable to manipulate soft tissues for epiglottis exposure. Short thyromental distance (2 fingerbreadths) and small oral aperture.  Changed to GlydeScope 4.0.

## 2014-12-18 NOTE — Anesthesia Preprocedure Evaluation (Addendum)
Anesthesia Evaluation  Patient identified by MRN, date of birth, ID band Patient awake    Reviewed: Allergy & Precautions, NPO status , Patient's Chart, lab work & pertinent test results  History of Anesthesia Complications Negative for: history of anesthetic complications  Airway Mallampati: II  TM Distance: >3 FB Neck ROM: Full    Dental  (+) Dental Advisory Given, Caps   Pulmonary asthma , COPD,  COPD inhaler, former smoker (quit 1958),     + wheezing (faint bilateral wheezing)      Cardiovascular hypertension, Pt. on medications (-) angina+ dysrhythmias Atrial Fibrillation  Rhythm:Irregular Rate:Normal  '09 ECHO: normal LVF, calcified aortic valve (no mention of AS), mild MR   Neuro/Psych negative neurological ROS     GI/Hepatic negative GI ROS, Neg liver ROS,   Endo/Other  negative endocrine ROS  Renal/GU negative Renal ROS     Musculoskeletal  (+) Arthritis ,   Abdominal   Peds  Hematology  (+) Blood dyscrasia (eliquis (last taken 9/18)), ,   Anesthesia Other Findings   Reproductive/Obstetrics                          Anesthesia Physical Anesthesia Plan  ASA: III  Anesthesia Plan: General   Post-op Pain Management:    Induction: Intravenous  Airway Management Planned: Oral ETT  Additional Equipment:   Intra-op Plan:   Post-operative Plan: Extubation in OR  Informed Consent: I have reviewed the patients History and Physical, chart, labs and discussed the procedure including the risks, benefits and alternatives for the proposed anesthesia with the patient or authorized representative who has indicated his/her understanding and acceptance.   Dental advisory given  Plan Discussed with: CRNA and Surgeon  Anesthesia Plan Comments: (Plan routine monitors, GETA)        Anesthesia Quick Evaluation

## 2014-12-18 NOTE — Progress Notes (Signed)
Assisted pt to br voided qs clear yellow urine w/o difficulty

## 2014-12-18 NOTE — Discharge Instructions (Signed)
Flexible Bronchoscopy, Care After °These instructions give you information on caring for yourself after your procedure. Your doctor may also give you more specific instructions. Call your doctor if you have any problems or questions after your procedure. °HOME CARE °· Do not eat or drink anything for 2 hours after your procedure. If you try to eat or drink before the medicine wears off, food or drink could go into your lungs. You could also burn yourself. °· After 2 hours have passed and when you can cough and gag normally, you may eat soft food and drink liquids slowly. °· The day after the test, you may eat your normal diet. °· You may do your normal activities. °· Keep all doctor visits. °GET HELP RIGHT AWAY IF: °· You get more and more short of breath. °· You get light-headed. °· You feel like you are going to pass out (faint). °· You have chest pain. °· You have new problems that worry you. °· You cough up more than a little blood. °· You cough up more blood than before. °MAKE SURE YOU: °· Understand these instructions. °· Will watch your condition. °· Will get help right away if you are not doing well or get worse. °Document Released: 01/10/2009 Document Revised: 03/20/2013 Document Reviewed: 11/17/2012 °ExitCare® Patient Information ©2015 [employer]. This information is not intended to replace advice given to you by your health care provider. Make sure you discuss any questions you have with your health care provider. ° °Please call our office for any questions or concerns. [phone].  ° ° ° °

## 2014-12-19 ENCOUNTER — Encounter (HOSPITAL_COMMUNITY): Payer: Self-pay | Admitting: Emergency Medicine

## 2014-12-20 ENCOUNTER — Telehealth: Payer: Self-pay | Admitting: Pulmonary Disease

## 2014-12-20 ENCOUNTER — Telehealth: Payer: Self-pay | Admitting: Emergency Medicine

## 2014-12-20 DIAGNOSIS — Z87891 Personal history of nicotine dependence: Secondary | ICD-10-CM | POA: Diagnosis not present

## 2014-12-20 DIAGNOSIS — R918 Other nonspecific abnormal finding of lung field: Secondary | ICD-10-CM | POA: Diagnosis not present

## 2014-12-20 DIAGNOSIS — I4891 Unspecified atrial fibrillation: Secondary | ICD-10-CM | POA: Diagnosis not present

## 2014-12-20 DIAGNOSIS — J479 Bronchiectasis, uncomplicated: Secondary | ICD-10-CM | POA: Diagnosis not present

## 2014-12-20 DIAGNOSIS — C3490 Malignant neoplasm of unspecified part of unspecified bronchus or lung: Secondary | ICD-10-CM | POA: Diagnosis not present

## 2014-12-20 DIAGNOSIS — J82 Pulmonary eosinophilia, not elsewhere classified: Secondary | ICD-10-CM | POA: Diagnosis not present

## 2014-12-20 DIAGNOSIS — I10 Essential (primary) hypertension: Secondary | ICD-10-CM | POA: Diagnosis not present

## 2014-12-20 DIAGNOSIS — C3432 Malignant neoplasm of lower lobe, left bronchus or lung: Secondary | ICD-10-CM | POA: Diagnosis not present

## 2014-12-20 DIAGNOSIS — G629 Polyneuropathy, unspecified: Secondary | ICD-10-CM | POA: Diagnosis not present

## 2014-12-20 DIAGNOSIS — C3492 Malignant neoplasm of unspecified part of left bronchus or lung: Secondary | ICD-10-CM | POA: Diagnosis not present

## 2014-12-20 NOTE — Telephone Encounter (Signed)
Spoke with pt, states he is seeing IR through [facility] this afternoon and wants to make sure his providers can see his report.  I advised that they should be able to see the report in Care everywhere.  Pt also states he has the report on his MyChart.  Nothing further needed.

## 2014-12-20 NOTE — Telephone Encounter (Signed)
I reviewed the pathology results with [name]. Shows non-small cell lung cancer probably adenocarcinoma. He is planning to be seen in interventional radiology at [facility] this afternoon. The data should be available for them to review.

## 2014-12-25 DIAGNOSIS — Z23 Encounter for immunization: Secondary | ICD-10-CM | POA: Diagnosis not present

## 2014-12-25 DIAGNOSIS — R042 Hemoptysis: Secondary | ICD-10-CM | POA: Diagnosis not present

## 2014-12-25 NOTE — Telephone Encounter (Signed)
I believe we already reviewed the results. Nothing else to add unless he has questions.

## 2014-12-25 NOTE — Telephone Encounter (Signed)
Pt returning call.[name] ° °

## 2014-12-31 DIAGNOSIS — J3089 Other allergic rhinitis: Secondary | ICD-10-CM | POA: Diagnosis not present

## 2014-12-31 DIAGNOSIS — J301 Allergic rhinitis due to pollen: Secondary | ICD-10-CM | POA: Diagnosis not present

## 2015-01-02 DIAGNOSIS — J90 Pleural effusion, not elsewhere classified: Secondary | ICD-10-CM | POA: Diagnosis not present

## 2015-01-02 DIAGNOSIS — I4891 Unspecified atrial fibrillation: Secondary | ICD-10-CM | POA: Diagnosis not present

## 2015-01-02 DIAGNOSIS — I10 Essential (primary) hypertension: Secondary | ICD-10-CM | POA: Diagnosis not present

## 2015-01-02 DIAGNOSIS — Z96641 Presence of right artificial hip joint: Secondary | ICD-10-CM | POA: Diagnosis not present

## 2015-01-02 DIAGNOSIS — N400 Enlarged prostate without lower urinary tract symptoms: Secondary | ICD-10-CM | POA: Diagnosis not present

## 2015-01-02 DIAGNOSIS — G629 Polyneuropathy, unspecified: Secondary | ICD-10-CM | POA: Diagnosis not present

## 2015-01-02 DIAGNOSIS — J942 Hemothorax: Secondary | ICD-10-CM | POA: Diagnosis not present

## 2015-01-02 DIAGNOSIS — R918 Other nonspecific abnormal finding of lung field: Secondary | ICD-10-CM | POA: Diagnosis not present

## 2015-01-02 DIAGNOSIS — Z87891 Personal history of nicotine dependence: Secondary | ICD-10-CM | POA: Diagnosis not present

## 2015-01-02 DIAGNOSIS — J9811 Atelectasis: Secondary | ICD-10-CM | POA: Diagnosis not present

## 2015-01-02 DIAGNOSIS — C3432 Malignant neoplasm of lower lobe, left bronchus or lung: Secondary | ICD-10-CM | POA: Diagnosis not present

## 2015-01-02 DIAGNOSIS — Z79899 Other long term (current) drug therapy: Secondary | ICD-10-CM | POA: Diagnosis not present

## 2015-01-02 DIAGNOSIS — J479 Bronchiectasis, uncomplicated: Secondary | ICD-10-CM | POA: Diagnosis not present

## 2015-01-02 DIAGNOSIS — J984 Other disorders of lung: Secondary | ICD-10-CM | POA: Diagnosis not present

## 2015-01-02 DIAGNOSIS — J948 Other specified pleural conditions: Secondary | ICD-10-CM | POA: Diagnosis not present

## 2015-01-02 DIAGNOSIS — M19011 Primary osteoarthritis, right shoulder: Secondary | ICD-10-CM | POA: Diagnosis not present

## 2015-01-03 DIAGNOSIS — J90 Pleural effusion, not elsewhere classified: Secondary | ICD-10-CM | POA: Diagnosis not present

## 2015-01-03 DIAGNOSIS — I10 Essential (primary) hypertension: Secondary | ICD-10-CM | POA: Diagnosis not present

## 2015-01-03 DIAGNOSIS — J948 Other specified pleural conditions: Secondary | ICD-10-CM | POA: Diagnosis not present

## 2015-01-03 DIAGNOSIS — C3432 Malignant neoplasm of lower lobe, left bronchus or lung: Secondary | ICD-10-CM | POA: Diagnosis not present

## 2015-01-03 DIAGNOSIS — Z48813 Encounter for surgical aftercare following surgery on the respiratory system: Secondary | ICD-10-CM | POA: Diagnosis not present

## 2015-01-03 DIAGNOSIS — R918 Other nonspecific abnormal finding of lung field: Secondary | ICD-10-CM | POA: Diagnosis not present

## 2015-01-03 DIAGNOSIS — J942 Hemothorax: Secondary | ICD-10-CM | POA: Diagnosis not present

## 2015-01-03 DIAGNOSIS — J9811 Atelectasis: Secondary | ICD-10-CM | POA: Diagnosis not present

## 2015-01-03 DIAGNOSIS — J479 Bronchiectasis, uncomplicated: Secondary | ICD-10-CM | POA: Diagnosis not present

## 2015-01-06 ENCOUNTER — Encounter (HOSPITAL_COMMUNITY): Payer: Self-pay | Admitting: Emergency Medicine

## 2015-01-06 ENCOUNTER — Emergency Department (HOSPITAL_COMMUNITY): Payer: Medicare Other

## 2015-01-06 ENCOUNTER — Inpatient Hospital Stay (HOSPITAL_COMMUNITY)
Admission: EM | Admit: 2015-01-06 | Discharge: 2015-01-09 | DRG: 919 | Disposition: A | Discharge status: Home / Self Care | Payer: Medicare Other | Attending: Family Medicine | Admitting: Family Medicine

## 2015-01-06 DIAGNOSIS — J189 Pneumonia, unspecified organism: Secondary | ICD-10-CM | POA: Diagnosis not present

## 2015-01-06 DIAGNOSIS — Z8249 Family history of ischemic heart disease and other diseases of the circulatory system: Secondary | ICD-10-CM | POA: Diagnosis not present

## 2015-01-06 DIAGNOSIS — Z87891 Personal history of nicotine dependence: Secondary | ICD-10-CM

## 2015-01-06 DIAGNOSIS — J45909 Unspecified asthma, uncomplicated: Secondary | ICD-10-CM | POA: Diagnosis present

## 2015-01-06 DIAGNOSIS — Z96641 Presence of right artificial hip joint: Secondary | ICD-10-CM | POA: Diagnosis present

## 2015-01-06 DIAGNOSIS — J942 Hemothorax: Secondary | ICD-10-CM | POA: Diagnosis not present

## 2015-01-06 DIAGNOSIS — R0600 Dyspnea, unspecified: Secondary | ICD-10-CM | POA: Diagnosis not present

## 2015-01-06 DIAGNOSIS — J948 Other specified pleural conditions: Secondary | ICD-10-CM | POA: Diagnosis not present

## 2015-01-06 DIAGNOSIS — J9690 Respiratory failure, unspecified, unspecified whether with hypoxia or hypercapnia: Secondary | ICD-10-CM

## 2015-01-06 DIAGNOSIS — Z823 Family history of stroke: Secondary | ICD-10-CM

## 2015-01-06 DIAGNOSIS — J4520 Mild intermittent asthma, uncomplicated: Secondary | ICD-10-CM | POA: Diagnosis not present

## 2015-01-06 DIAGNOSIS — Z85828 Personal history of other malignant neoplasm of skin: Secondary | ICD-10-CM | POA: Diagnosis not present

## 2015-01-06 DIAGNOSIS — I248 Other forms of acute ischemic heart disease: Secondary | ICD-10-CM | POA: Diagnosis present

## 2015-01-06 DIAGNOSIS — Z801 Family history of malignant neoplasm of trachea, bronchus and lung: Secondary | ICD-10-CM

## 2015-01-06 DIAGNOSIS — R609 Edema, unspecified: Secondary | ICD-10-CM | POA: Diagnosis not present

## 2015-01-06 DIAGNOSIS — C3490 Malignant neoplasm of unspecified part of unspecified bronchus or lung: Secondary | ICD-10-CM | POA: Diagnosis present

## 2015-01-06 DIAGNOSIS — Z806 Family history of leukemia: Secondary | ICD-10-CM

## 2015-01-06 DIAGNOSIS — J441 Chronic obstructive pulmonary disease with (acute) exacerbation: Secondary | ICD-10-CM | POA: Diagnosis present

## 2015-01-06 DIAGNOSIS — J9600 Acute respiratory failure, unspecified whether with hypoxia or hypercapnia: Secondary | ICD-10-CM | POA: Diagnosis present

## 2015-01-06 DIAGNOSIS — Y95 Nosocomial condition: Secondary | ICD-10-CM | POA: Diagnosis present

## 2015-01-06 DIAGNOSIS — I4821 Permanent atrial fibrillation: Secondary | ICD-10-CM | POA: Diagnosis present

## 2015-01-06 DIAGNOSIS — Z86010 Personal history of colonic polyps: Secondary | ICD-10-CM

## 2015-01-06 DIAGNOSIS — J90 Pleural effusion, not elsewhere classified: Secondary | ICD-10-CM | POA: Diagnosis not present

## 2015-01-06 DIAGNOSIS — I5031 Acute diastolic (congestive) heart failure: Secondary | ICD-10-CM | POA: Diagnosis present

## 2015-01-06 DIAGNOSIS — D649 Anemia, unspecified: Secondary | ICD-10-CM | POA: Diagnosis present

## 2015-01-06 DIAGNOSIS — J95830 Postprocedural hemorrhage and hematoma of a respiratory system organ or structure following a respiratory system procedure: Principal | ICD-10-CM | POA: Diagnosis present

## 2015-01-06 DIAGNOSIS — I480 Paroxysmal atrial fibrillation: Secondary | ICD-10-CM | POA: Diagnosis present

## 2015-01-06 DIAGNOSIS — Z7901 Long term (current) use of anticoagulants: Secondary | ICD-10-CM | POA: Diagnosis not present

## 2015-01-06 DIAGNOSIS — M1990 Unspecified osteoarthritis, unspecified site: Secondary | ICD-10-CM | POA: Diagnosis present

## 2015-01-06 DIAGNOSIS — K5730 Diverticulosis of large intestine without perforation or abscess without bleeding: Secondary | ICD-10-CM | POA: Diagnosis present

## 2015-01-06 DIAGNOSIS — C3432 Malignant neoplasm of lower lobe, left bronchus or lung: Secondary | ICD-10-CM | POA: Diagnosis present

## 2015-01-06 DIAGNOSIS — R042 Hemoptysis: Secondary | ICD-10-CM | POA: Diagnosis present

## 2015-01-06 DIAGNOSIS — I110 Hypertensive heart disease with heart failure: Secondary | ICD-10-CM | POA: Diagnosis present

## 2015-01-06 DIAGNOSIS — J9601 Acute respiratory failure with hypoxia: Secondary | ICD-10-CM | POA: Diagnosis not present

## 2015-01-06 DIAGNOSIS — R0602 Shortness of breath: Secondary | ICD-10-CM | POA: Diagnosis not present

## 2015-01-06 DIAGNOSIS — J9811 Atelectasis: Secondary | ICD-10-CM | POA: Diagnosis present

## 2015-01-06 LAB — CBC WITH DIFFERENTIAL/PLATELET
Basophils Absolute: 0 10*3/uL (ref 0.0–0.1)
Basophils Relative: 0 %
EOS PCT: 0 %
Eosinophils Absolute: 0 10*3/uL (ref 0.0–0.7)
HCT: 27.4 % — ABNORMAL LOW (ref 39.0–52.0)
Hemoglobin: 8.9 g/dL — ABNORMAL LOW (ref 13.0–17.0)
LYMPHS ABS: 0.5 10*3/uL — AB (ref 0.7–4.0)
LYMPHS PCT: 5 %
MCH: 30.1 pg (ref 26.0–34.0)
MCHC: 32.5 g/dL (ref 30.0–36.0)
MCV: 92.6 fL (ref 78.0–100.0)
MONO ABS: 0.8 10*3/uL (ref 0.1–1.0)
MONOS PCT: 9 %
Neutro Abs: 8.1 10*3/uL — ABNORMAL HIGH (ref 1.7–7.7)
Neutrophils Relative %: 86 %
PLATELETS: 191 10*3/uL (ref 150–400)
RBC: 2.96 MIL/uL — ABNORMAL LOW (ref 4.22–5.81)
RDW: 15.3 % (ref 11.5–15.5)
WBC: 9.4 10*3/uL (ref 4.0–10.5)

## 2015-01-06 LAB — I-STAT TROPONIN, ED: Troponin i, poc: 0.06 ng/mL (ref 0.00–0.08)

## 2015-01-06 LAB — BASIC METABOLIC PANEL
Anion gap: 9 (ref 5–15)
BUN: 21 mg/dL — AB (ref 6–20)
CALCIUM: 9 mg/dL (ref 8.9–10.3)
CO2: 24 mmol/L (ref 22–32)
Chloride: 92 mmol/L — ABNORMAL LOW (ref 101–111)
Creatinine, Ser: 0.89 mg/dL (ref 0.61–1.24)
GFR calc Af Amer: >60 mL/min (ref >60)
GLUCOSE: 123 mg/dL — AB (ref 65–99)
Potassium: 4.7 mmol/L (ref 3.5–5.1)
Sodium: 125 mmol/L — ABNORMAL LOW (ref 135–145)

## 2015-01-06 LAB — I-STAT CG4 LACTIC ACID, ED: Lactic Acid, Venous: 1.23 mmol/L (ref 0.5–2.0)

## 2015-01-06 MED ORDER — IPRATROPIUM-ALBUTEROL 0.5-2.5 (3) MG/3ML IN SOLN
3.0000 mL | Freq: Once | RESPIRATORY_TRACT | Status: AC
  Administered 2015-01-06: 3 mL via RESPIRATORY_TRACT
  Filled 2015-01-06: qty 3

## 2015-01-06 MED ORDER — SODIUM CHLORIDE 0.9 % IV BOLUS (SEPSIS)
1000.0000 mL | Freq: Once | INTRAVENOUS | Status: AC
  Administered 2015-01-06: 1000 mL via INTRAVENOUS

## 2015-01-06 NOTE — ED Notes (Signed)
Pt from home with c/o SOB starting yesterday with no relief from home inhalers.  Pt states he had a transcutanous cryoablation for bronchiogenic carcenoma this past Thursday with thoracentesis this past Friday at [facility].  Worsening cough today.  Pt in NAD, A&O.

## 2015-01-06 NOTE — ED Provider Notes (Signed)
CSN: [id number]     Arrival date & time 01/06/15  1731 History   First MD Initiated Contact with Patient 01/06/15 1858     Chief Complaint  Patient presents with  . Shortness of Breath  . post op     Patient is a 81 y.o. male presenting with shortness of breath. The history is provided by the patient, a relative and medical records.  Shortness of Breath Severity:  Moderate Onset quality:  Gradual Duration:  2 days Timing:  Constant Progression:  Worsening Chronicity:  New Context comment:  Recent tx for bronchogenic carcinoma on left, complicated by hemothorax that was tx by thoracentesis Worsened by:  Exertion Ineffective treatments:  Inhaler and rest (medrol dose pack) Associated symptoms: cough and wheezing   Associated symptoms: no abdominal pain, no chest pain, no fever, no hemoptysis (following procedure- states expected during recovery- none currently), no rash and no vomiting   Risk factors: recent surgery     Past Medical History  Diagnosis Date  . Bradycardia   . Palpitations   . Asthma   . Hemorrhoids   . Hx of adenomatous colonic polyps 02/1997  . Atrial fibrillation ([place])   . Hypertension   . 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 Diverticulosis of colon   . COPD (chronic obstructive pulmonary disease) (HCC)     mild  . Bowel obstruction ([place])   . Arthritis     osteoarthritis  . Cancer ([place])     skin cancer  . 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 Colon resection    . Total hip arthroplasty Right   . Appendectomy    . Laminectomy      Right L4-L5 Laminectomy  . Cystoscopy    . Rotator cuff repair Right   . Umbilical hernia repair    . Video bronchoscopy with endobronchial navigation N/A 12/18/2014    Procedure: VIDEO BRONCHOSCOPY WITH ENDOBRONCHIAL NAVIGATION ;  Surgeon: Collene Gobble, MD;  Location: [facility] OR;  Service: Thoracic;  Laterality: N/A;   Family History  Problem Relation Age of Onset  . Lung cancer  Father 73  . Stroke Mother 24  . Leukemia Brother   . Coronary artery disease Brother   . Heart failure Brother    Social History  Substance Use Topics  . Smoking status: Former Smoker -- 2.00 packs/day for 13 years    Types: Cigarettes    Quit date: 03/29/1956  . Smokeless tobacco: Never Used  . Alcohol Use: 0.0 oz/week    0 Standard drinks or equivalent per week     Comment: Quit drinking EtOH in 2012.  on weekends 2-3 bottles of wine.    Review of Systems  Constitutional: Negative for fever.  HENT: Negative for rhinorrhea.   Eyes: Negative for visual disturbance.  Respiratory: Positive for cough, shortness of breath and wheezing. Negative for hemoptysis (following procedure- states expected during recovery- none currently).   Cardiovascular: Negative for chest pain.  Gastrointestinal: Negative for vomiting and abdominal pain.  Genitourinary: Negative for decreased urine volume.  Skin: Negative for rash.  Allergic/Immunologic: Negative for immunocompromised state.  Neurological: Negative for syncope.  Psychiatric/Behavioral: Negative for confusion.    Allergies  Sulfonamide derivatives  Home Medications   Prior to Admission medications   Medication Sig Start Date End Date Taking? Authorizing Provider  albuterol (PROVENTIL HFA;VENTOLIN HFA) 108 (90 BASE) MCG/ACT inhaler Inhale 1-2 puffs into the lungs every 6 (six) hours as needed  for wheezing or shortness of breath.   Yes Historical Provider, MD  apixaban (ELIQUIS) 5 MG TABS tablet Take 1 tablet (5 mg total) by mouth 2 (two) times daily. Patient taking differently: Take 2.5 mg by mouth 3 (three) times daily.  11/22/12  Yes Evans Lance, MD  celecoxib (CELEBREX) 200 MG capsule Take 200 mg by mouth at bedtime.    Yes Historical Provider, MD  Cetirizine HCl (ZYRTEC ALLERGY PO) Take 10 mg by mouth at bedtime.    Yes Historical Provider, MD  CRESTOR 10 MG tablet Take 5 mg by mouth at bedtime.  08/19/14  Yes Historical  Provider, MD  finasteride (PROSCAR) 5 MG tablet Take 5 mg by mouth daily.     Yes Historical Provider, MD  fluticasone (FLONASE) 50 MCG/ACT nasal spray Place 2 sprays into the nose daily.     Yes Historical Provider, MD  Fluticasone-Salmeterol (ADVAIR DISKUS) 250-50 MCG/DOSE AEPB Inhale 1 puff into the lungs 2 (two) times daily.     Yes Historical Provider, MD  furosemide (LASIX) 20 MG tablet Take 20 mg by mouth daily as needed for fluid.  12/25/14  Yes Historical Provider, MD  Guaifenesin 1200 MG TB12 Take 1,200 mg by mouth 2 (two) times daily.   Yes Historical Provider, MD  methylPREDNISolone (MEDROL DOSEPAK) 4 MG TBPK tablet Take by mouth as directed.  01/05/15  Yes Historical Provider, MD  montelukast (SINGULAIR) 10 MG tablet Take 10 mg by mouth at bedtime.     Yes Historical Provider, MD  omeprazole (PRILOSEC) 20 MG capsule Take 20 mg by mouth every evening.    Yes Historical Provider, MD  oxyCODONE-acetaminophen (PERCOCET/ROXICET) 5-325 MG tablet Take 1 tablet by mouth every 6 (six) hours as needed for moderate pain or severe pain.  01/03/15  Yes Historical Provider, MD  temazepam (RESTORIL) 15 MG capsule Take 15 mg by mouth at bedtime.    Yes Historical Provider, MD  Tiotropium Bromide Monohydrate (SPIRIVA RESPIMAT) 2.5 MCG/ACT AERS Inhale 2 puffs into the lungs daily.   Yes Historical Provider, MD  verapamil (COVERA HS) 180 MG (CO) 24 hr tablet Take 1 tablet (180 mg total) by mouth at bedtime. 01/16/13  Yes Evans Lance, MD  XOPENEX HFA 45 MCG/ACT inhaler 1-2 puffs every 6 (six) hours as needed for shortness of breath.  09/10/14  Yes Historical Provider, MD  zolpidem (AMBIEN) 10 MG tablet Take 10 mg by mouth at bedtime as needed for sleep.    Yes Historical Provider, MD   BP 136/80 mmHg  Pulse 79  Temp(Src) 97.7 F (36.5 C) (Oral)  Resp 18  Wt 170 lb 3.2 oz (77.202 kg)  SpO2 99% Physical Exam  Constitutional: He is oriented to person, place, and time. He appears well-developed and  well-nourished. No distress.  HENT:  Head: Normocephalic and atraumatic.  Eyes: Right eye exhibits no discharge. Left eye exhibits no discharge.  Neck: No tracheal deviation present.  Cardiovascular: Normal rate and regular rhythm.   Pulmonary/Chest: Effort normal. No respiratory distress. He has wheezes.  Diminished at left lung base. Dressing from recent thoracentesis c/d/i no drainage   Abdominal: Soft. He exhibits no distension. There is no tenderness.  Musculoskeletal: He exhibits no edema.  Neurological: He is alert and oriented to person, place, and time.  Skin: Skin is warm and dry.  Psychiatric: He has a normal mood and affect. His behavior is normal.  Nursing note and vitals reviewed.   ED Course  Procedures (including critical care  time) Labs Review Labs Reviewed  CBC WITH DIFFERENTIAL/PLATELET - Abnormal; Notable for the following:    RBC 2.96 (*)    Hemoglobin 8.9 (*)    HCT 27.4 (*)    Neutro Abs 8.1 (*)    Lymphs Abs 0.5 (*)    All other components within normal limits  BASIC METABOLIC PANEL - Abnormal; Notable for the following:    Sodium 125 (*)    Chloride 92 (*)    Glucose, Bld 123 (*)    BUN 21 (*)    All other components within normal limits  I-STAT TROPOININ, ED  I-STAT CG4 LACTIC ACID, ED    Imaging Review Dg Chest 2 View  01/06/2015   CLINICAL DATA:  Acute shortness of breath for 3 days. Patient with lung cancer.  EXAM: CHEST  2 VIEW  COMPARISON:  None.  FINDINGS: Cardiomediastinal silhouette is unchanged.  Increased opacity in the area of patient's known superior segment left lower lobe malignancy. This may represent adjacent hemorrhage, post obstructive inflammation or infection.  Left lower lung airspace disease/ atelectasis and new small left pleural effusion noted.  A trace right pleural effusion is again noted.  Emphysema/ COPD again noted.  There is no evidence of pneumothorax.  IMPRESSION: Increased opacity in the area the patient's known  superior segment left lower lobe malignancy which may represent adjacent hemorrhage, postobstructive inflammation or infection.  Left lower lung airspace disease/ atelectasis and new small left pleural effusion.  Emphysema/COPD.   Electronically Signed   By: Margarette Canada M.D.   On: 01/06/2015 18:42   I have personally reviewed and evaluated these images and lab results as part of my medical decision-making.   EKG Interpretation None      MDM   Final diagnoses:  Shortness of breath    81 yo M with hx bronchogenic carcinoma s/p recent cryotherapy complicated by hemothorax, asthma, pAF, HTN, HLD presenting with shortness of breath as above. Found to have pleural effusion on left, possible hemorrhage. Likely multifactorial cause for acute respiratory distress. Significant wheezing on exam so will give DuoNeb. However, pleural effusion, post obstructive hemorrhage or edema, or underlying/superimposed PNA possibly contributing, as well as possible acute anemia. AF, VSS. Admitted for further management.   Case discussed with Dr. Tomi Bamberger, who oversaw management of this patient.     Ivin Booty, MD 01/07/15 [phone]

## 2015-01-06 NOTE — Progress Notes (Signed)
PATIENT ARRIVED TO UNIT 2W FROM E.D. VIA STRETCHER. ASSISTED TO BED. TELE APPLIED. VITALS OBTAINED. ASSESSMENT PERFORMED.  PATIENT INSTRUCTED TO CALL FOR ASSISTANCE WHEN NEEDED.  DAUGHTER AT BEDSIDE.

## 2015-01-07 ENCOUNTER — Inpatient Hospital Stay (HOSPITAL_COMMUNITY): Payer: Medicare Other

## 2015-01-07 ENCOUNTER — Encounter (HOSPITAL_COMMUNITY): Payer: Self-pay | Admitting: Internal Medicine

## 2015-01-07 DIAGNOSIS — R609 Edema, unspecified: Secondary | ICD-10-CM

## 2015-01-07 DIAGNOSIS — J90 Pleural effusion, not elsewhere classified: Secondary | ICD-10-CM

## 2015-01-07 DIAGNOSIS — I4821 Permanent atrial fibrillation: Secondary | ICD-10-CM | POA: Diagnosis present

## 2015-01-07 DIAGNOSIS — J4520 Mild intermittent asthma, uncomplicated: Secondary | ICD-10-CM

## 2015-01-07 DIAGNOSIS — R0600 Dyspnea, unspecified: Secondary | ICD-10-CM

## 2015-01-07 DIAGNOSIS — J9601 Acute respiratory failure with hypoxia: Secondary | ICD-10-CM

## 2015-01-07 DIAGNOSIS — J9600 Acute respiratory failure, unspecified whether with hypoxia or hypercapnia: Secondary | ICD-10-CM | POA: Diagnosis present

## 2015-01-07 DIAGNOSIS — J189 Pneumonia, unspecified organism: Secondary | ICD-10-CM

## 2015-01-07 LAB — CBC WITH DIFFERENTIAL/PLATELET
BASOS ABS: 0 10*3/uL (ref 0.0–0.1)
BASOS PCT: 0 %
EOS PCT: 0 %
Eosinophils Absolute: 0 10*3/uL (ref 0.0–0.7)
HCT: 25.8 % — ABNORMAL LOW (ref 39.0–52.0)
Hemoglobin: 8.5 g/dL — ABNORMAL LOW (ref 13.0–17.0)
Lymphocytes Relative: 7 %
Lymphs Abs: 0.7 10*3/uL (ref 0.7–4.0)
MCH: 30.5 pg (ref 26.0–34.0)
MCHC: 32.9 g/dL (ref 30.0–36.0)
MCV: 92.5 fL (ref 78.0–100.0)
MONO ABS: 1.5 10*3/uL — AB (ref 0.1–1.0)
Monocytes Relative: 15 %
NEUTROS ABS: 7.8 10*3/uL — AB (ref 1.7–7.7)
Neutrophils Relative %: 78 %
PLATELETS: 167 10*3/uL (ref 150–400)
RBC: 2.79 MIL/uL — AB (ref 4.22–5.81)
RDW: 15.4 % (ref 11.5–15.5)
WBC: 9.7 10*3/uL (ref 4.0–10.5)

## 2015-01-07 LAB — COMPREHENSIVE METABOLIC PANEL
ALBUMIN: 3 g/dL — AB (ref 3.5–5.0)
ALK PHOS: 60 U/L (ref 38–126)
ALT: 32 U/L (ref 17–63)
ANION GAP: 7 (ref 5–15)
AST: 52 U/L — ABNORMAL HIGH (ref 15–41)
BILIRUBIN TOTAL: 0.8 mg/dL (ref 0.3–1.2)
BUN: 20 mg/dL (ref 6–20)
CALCIUM: 8.9 mg/dL (ref 8.9–10.3)
CO2: 26 mmol/L (ref 22–32)
Chloride: 97 mmol/L — ABNORMAL LOW (ref 101–111)
Creatinine, Ser: 0.91 mg/dL (ref 0.61–1.24)
GFR calc Af Amer: >60 mL/min (ref >60)
GLUCOSE: 126 mg/dL — AB (ref 65–99)
POTASSIUM: 4.5 mmol/L (ref 3.5–5.1)
Sodium: 130 mmol/L — ABNORMAL LOW (ref 135–145)
TOTAL PROTEIN: 5.7 g/dL — AB (ref 6.5–8.1)

## 2015-01-07 LAB — PROTEIN, BODY FLUID

## 2015-01-07 LAB — LACTATE DEHYDROGENASE, PLEURAL OR PERITONEAL FLUID: LD, Fluid: 766 U/L — ABNORMAL HIGH (ref 3–23)

## 2015-01-07 LAB — BRAIN NATRIURETIC PEPTIDE: B Natriuretic Peptide: 559 pg/mL — ABNORMAL HIGH (ref 0.0–100.0)

## 2015-01-07 LAB — BODY FLUID CELL COUNT WITH DIFFERENTIAL
EOS FL: 8 %
LYMPHS FL: 18 %
MONOCYTE-MACROPHAGE-SEROUS FLUID: 29 % — AB (ref 50–90)
Neutrophil Count, Fluid: 45 % — ABNORMAL HIGH (ref 0–25)
Total Nucleated Cell Count, Fluid: 3953 cu mm — ABNORMAL HIGH (ref 0–1000)

## 2015-01-07 LAB — HEMATOCRIT, BODY FLUID: Hematocrit, Fluid: 22.3 %

## 2015-01-07 LAB — PROTEIN, TOTAL: TOTAL PROTEIN: 6 g/dL — AB (ref 6.5–8.1)

## 2015-01-07 LAB — GLUCOSE, SEROUS FLUID: Glucose, Fluid: 63 mg/dL

## 2015-01-07 LAB — PROCALCITONIN: Procalcitonin: <0.1 ng/mL

## 2015-01-07 LAB — TROPONIN I: Troponin I: 0.06 ng/mL — ABNORMAL HIGH (ref <0.031)

## 2015-01-07 LAB — LACTATE DEHYDROGENASE: LDH: 214 U/L — AB (ref 98–192)

## 2015-01-07 MED ORDER — MOMETASONE FURO-FORMOTEROL FUM 100-5 MCG/ACT IN AERO
2.0000 | INHALATION_SPRAY | Freq: Two times a day (BID) | RESPIRATORY_TRACT | Status: DC
  Administered 2015-01-07 – 2015-01-09 (×5): 2 via RESPIRATORY_TRACT
  Filled 2015-01-07: qty 8.8

## 2015-01-07 MED ORDER — FINASTERIDE 5 MG PO TABS
5.0000 mg | ORAL_TABLET | Freq: Every day | ORAL | Status: DC
  Administered 2015-01-07 – 2015-01-09 (×3): 5 mg via ORAL
  Filled 2015-01-07 (×3): qty 1

## 2015-01-07 MED ORDER — FUROSEMIDE 10 MG/ML IJ SOLN
20.0000 mg | Freq: Once | INTRAMUSCULAR | Status: AC
  Administered 2015-01-07: 20 mg via INTRAVENOUS
  Filled 2015-01-07: qty 2

## 2015-01-07 MED ORDER — LORATADINE 10 MG PO TABS
10.0000 mg | ORAL_TABLET | Freq: Every day | ORAL | Status: DC
  Administered 2015-01-07 – 2015-01-08 (×2): 10 mg via ORAL
  Filled 2015-01-07 (×2): qty 1

## 2015-01-07 MED ORDER — LEVALBUTEROL HCL 0.63 MG/3ML IN NEBU: 0.6300 mg | INHALATION_SOLUTION | RESPIRATORY_TRACT | Status: DC | PRN

## 2015-01-07 MED ORDER — OXYCODONE-ACETAMINOPHEN 5-325 MG PO TABS
1.0000 | ORAL_TABLET | Freq: Four times a day (QID) | ORAL | Status: DC | PRN
  Administered 2015-01-07 – 2015-01-09 (×4): 1 via ORAL
  Filled 2015-01-07 (×4): qty 1

## 2015-01-07 MED ORDER — CELECOXIB 200 MG PO CAPS
200.0000 mg | ORAL_CAPSULE | Freq: Every day | ORAL | Status: DC
  Administered 2015-01-08: 200 mg via ORAL
  Filled 2015-01-07 (×4): qty 1

## 2015-01-07 MED ORDER — FUROSEMIDE 20 MG PO TABS
20.0000 mg | ORAL_TABLET | Freq: Once | ORAL | Status: AC
  Administered 2015-01-07: 20 mg via ORAL
  Filled 2015-01-07: qty 1

## 2015-01-07 MED ORDER — SODIUM CHLORIDE 0.9 % IJ SOLN
3.0000 mL | Freq: Two times a day (BID) | INTRAMUSCULAR | Status: DC
  Administered 2015-01-07 – 2015-01-09 (×5): 3 mL via INTRAVENOUS

## 2015-01-07 MED ORDER — LEVALBUTEROL HCL 0.63 MG/3ML IN NEBU: 0.6300 mg | INHALATION_SOLUTION | Freq: Four times a day (QID) | RESPIRATORY_TRACT | Status: DC | PRN

## 2015-01-07 MED ORDER — ROSUVASTATIN CALCIUM 5 MG PO TABS
5.0000 mg | ORAL_TABLET | Freq: Every day | ORAL | Status: DC
  Administered 2015-01-07 – 2015-01-08 (×2): 5 mg via ORAL
  Filled 2015-01-07 (×4): qty 1

## 2015-01-07 MED ORDER — TIOTROPIUM BROMIDE MONOHYDRATE 18 MCG IN CAPS
18.0000 ug | ORAL_CAPSULE | Freq: Every day | RESPIRATORY_TRACT | Status: DC
  Administered 2015-01-07 – 2015-01-09 (×3): 18 ug via RESPIRATORY_TRACT
  Filled 2015-01-07: qty 5

## 2015-01-07 MED ORDER — PIPERACILLIN-TAZOBACTAM 3.375 G IVPB
3.3750 g | Freq: Three times a day (TID) | INTRAVENOUS | Status: DC
  Administered 2015-01-07 – 2015-01-08 (×3): 3.375 g via INTRAVENOUS
  Filled 2015-01-07 (×5): qty 50

## 2015-01-07 MED ORDER — PANTOPRAZOLE SODIUM 40 MG PO TBEC
40.0000 mg | DELAYED_RELEASE_TABLET | Freq: Every day | ORAL | Status: DC
  Administered 2015-01-07 – 2015-01-09 (×3): 40 mg via ORAL
  Filled 2015-01-07 (×3): qty 1

## 2015-01-07 MED ORDER — VANCOMYCIN HCL IN DEXTROSE 750-5 MG/150ML-% IV SOLN
750.0000 mg | Freq: Two times a day (BID) | INTRAVENOUS | Status: DC
  Administered 2015-01-07 – 2015-01-08 (×2): 750 mg via INTRAVENOUS
  Filled 2015-01-07 (×3): qty 150

## 2015-01-07 MED ORDER — ACETAMINOPHEN 325 MG PO TABS: 650.0000 mg | ORAL_TABLET | Freq: Four times a day (QID) | ORAL | Status: DC | PRN

## 2015-01-07 MED ORDER — FUROSEMIDE 10 MG/ML IJ SOLN: 20.0000 mg | Freq: Once | INTRAMUSCULAR | Status: DC

## 2015-01-07 MED ORDER — FLUTICASONE PROPIONATE 50 MCG/ACT NA SUSP
2.0000 | Freq: Every day | NASAL | Status: DC
  Administered 2015-01-07 – 2015-01-09 (×3): 2 via NASAL
  Filled 2015-01-07: qty 16

## 2015-01-07 MED ORDER — ACETAMINOPHEN 650 MG RE SUPP: 650.0000 mg | Freq: Four times a day (QID) | RECTAL | Status: DC | PRN

## 2015-01-07 MED ORDER — MONTELUKAST SODIUM 10 MG PO TABS
10.0000 mg | ORAL_TABLET | Freq: Every day | ORAL | Status: DC
  Administered 2015-01-07 – 2015-01-08 (×2): 10 mg via ORAL
  Filled 2015-01-07 (×2): qty 1

## 2015-01-07 MED ORDER — LEVALBUTEROL HCL 0.63 MG/3ML IN NEBU
0.6300 mg | INHALATION_SOLUTION | Freq: Four times a day (QID) | RESPIRATORY_TRACT | Status: DC
  Administered 2015-01-07 (×2): 0.63 mg via RESPIRATORY_TRACT
  Filled 2015-01-07 (×2): qty 3

## 2015-01-07 MED ORDER — ZOLPIDEM TARTRATE 5 MG PO TABS
5.0000 mg | ORAL_TABLET | Freq: Every evening | ORAL | Status: DC | PRN
  Administered 2015-01-07 – 2015-01-08 (×3): 5 mg via ORAL
  Filled 2015-01-07 (×3): qty 1

## 2015-01-07 MED ORDER — IOHEXOL 300 MG/ML  SOLN
100.0000 mL | Freq: Once | INTRAMUSCULAR | Status: AC | PRN
  Administered 2015-01-07: 100 mL via INTRAVENOUS

## 2015-01-07 MED ORDER — VERAPAMIL HCL ER 180 MG PO TBCR
180.0000 mg | EXTENDED_RELEASE_TABLET | Freq: Every day | ORAL | Status: DC
  Administered 2015-01-07 – 2015-01-08 (×3): 180 mg via ORAL
  Filled 2015-01-07 (×4): qty 1

## 2015-01-07 MED ORDER — ONDANSETRON HCL 4 MG PO TABS: 4.0000 mg | ORAL_TABLET | Freq: Four times a day (QID) | ORAL | Status: DC | PRN

## 2015-01-07 MED ORDER — ONDANSETRON HCL 4 MG/2ML IJ SOLN: 4.0000 mg | Freq: Four times a day (QID) | INTRAMUSCULAR | Status: DC | PRN

## 2015-01-07 MED ORDER — TEMAZEPAM 15 MG PO CAPS
15.0000 mg | ORAL_CAPSULE | Freq: Every day | ORAL | Status: DC
  Administered 2015-01-07 – 2015-01-08 (×3): 15 mg via ORAL
  Filled 2015-01-07 (×3): qty 1

## 2015-01-07 MED ORDER — METHYLPREDNISOLONE SODIUM SUCC 40 MG IJ SOLR
40.0000 mg | Freq: Two times a day (BID) | INTRAMUSCULAR | Status: DC
  Administered 2015-01-07 – 2015-01-09 (×4): 40 mg via INTRAVENOUS
  Filled 2015-01-07 (×5): qty 1

## 2015-01-07 NOTE — Progress Notes (Signed)
*  PRELIMINARY RESULTS* Echocardiogram 2D Echocardiogram has been performed.  Leavy Cella 01/07/2015, 2:15 PM

## 2015-01-07 NOTE — Consult Note (Signed)
Name: [name] MRN: [id number] DOB: 05-26-1924    ADMISSION DATE:  01/06/2015 CONSULTATION DATE:  01/07/15  REFERRING MD :  Algis [name]  CHIEF COMPLAINT:  Exertional SOB and hemoptysis  BRIEF PATIENT DESCRIPTION: 81 y.o. M with recently diagnosed NSCLC s/p cryoablation of LLL at [facility] 01/02/15, now presenting to [facility] with exertional SOB and hemoptysis.  PCCM consulted.   SIGNIFICANT EVENTS  12/11/14 - seen in clinic by [name] for lung mass 12/18/14 - navigational bronchoscopy with biopsies by [name] > pathology noteable for NSCLC; probably adenocarcinoma. 01/02/15 - cryoablation of left lower lobe (at [name]). 01/03/15 - left sided thoracentesis yielding 930m serous fluid. [facility] 01/07/15 - PCCM consulted.   STUDIES:  CT chest 10/10 >>> nodule in the superior segment of the LLL which has become more diffuse with focal gas collections, likely a result of recent intervention.  Interval development of a left pleural effusion, possibly hemorrhagic, along with atelectasis of the left lower lung.  Small right pleural effusion. Chronic bronchiectasis mostly on the right with associated scarring.    HISTORY OF PRESENT ILLNESS:  [name] CRATTYis a 81y.o. M who has been evaluated by [name] LLL mass and underwent navigational bronchoscopy with biopsies on 12/18/14.  Pathology returned and showed NSCLC, probably adenocarcinoma.  [name].  This was [facility] 01/[facility].  Procedure was unfortunately complicated by left sided hemothorax for which he had thoracentesis performed 01/03/15 which yielded 9667mof serous fluid.  His apixaban (which he is on for PAF) was held prior to procedure and was re-started 01/04/15.  On 01/05/15, pt began to experience exertional SOB along with intermittent hemoptysis.  [name]  CT chest showed the known nodule in the superior  segment of the LLL which has become more diffuse with focal gas collections, likely a result of recent intervention.  There was also b/l pleural effusions, L > R, along with atelectasis of left lower lung.  He was subsequently admitted for further evaluation and management.  Due to his exertional dyspnea and hemoptysis in the setting of known NSCLC, PCCM was consulted 10/11 for further recs.   PAST MEDICAL HISTORY :   has a past medical history of Bradycardia; Palpitations; Asthma; Hemorrhoids; adenomatous colonic polyps (02/1997); Atrial fibrillation (HCV[place] Hypertension; Pulmonary aspergillosis (HCRivers[place] Diverticulosis of colon; COPD (chronic obstructive pulmonary disease) (HCHodges Bowel obstruction (HCMacon Arthritis; Cancer (HCTeachey and 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HCEdge[place]  has past surgical history that includes Colon resection; Total hip arthroplasty (Right); Appendectomy; Laminectomy; Cystoscopy; Rotator cuff repair (Right); Umbilical hernia repair; and Video bronchoscopy with endobronchial navigation (N/A, 12/18/2014). Prior to Admission medications   Medication Sig Start Date End Date Taking? Authorizing Provider  albuterol (PROVENTIL HFA;VENTOLIN HFA) 108 (90 BASE) MCG/ACT inhaler Inhale 1-2 puffs into the lungs every 6 (six) hours as needed for wheezing or shortness of breath.   Yes Historical Provider, MD  apixaban (ELIQUIS) 5 MG TABS tablet Take 1 tablet (5 mg total) by mouth 2 (two) times daily. Patient taking differently: Take 2.5 mg by mouth 3 (three) times daily.  11/22/12  Yes GrEvans LanceMD  celecoxib (CELEBREX) 200 MG capsule Take 200 mg by mouth at bedtime.    Yes Historical Provider, MD  Cetirizine HCl (ZYRTEC ALLERGY PO) Take 10 mg by mouth at bedtime.    Yes Historical Provider, MD  CRESTOR 10 MG tablet Take  5 mg by mouth at bedtime.  08/19/14  Yes Historical Provider, MD  finasteride (PROSCAR) 5 MG tablet Take 5 mg by mouth daily.     Yes Historical Provider, MD  fluticasone (FLONASE) 50  MCG/ACT nasal spray Place 2 sprays into the nose daily.     Yes Historical Provider, MD  Fluticasone-Salmeterol (ADVAIR DISKUS) 250-50 MCG/DOSE AEPB Inhale 1 puff into the lungs 2 (two) times daily.     Yes Historical Provider, MD  furosemide (LASIX) 20 MG tablet Take 20 mg by mouth daily as needed for fluid.  12/25/14  Yes Historical Provider, MD  Guaifenesin 1200 MG TB12 Take 1,200 mg by mouth 2 (two) times daily.   Yes Historical Provider, MD  methylPREDNISolone (MEDROL DOSEPAK) 4 MG TBPK tablet Take by mouth as directed.  01/05/15  Yes Historical Provider, MD  montelukast (SINGULAIR) 10 MG tablet Take 10 mg by mouth at bedtime.     Yes Historical Provider, MD  omeprazole (PRILOSEC) 20 MG capsule Take 20 mg by mouth every evening.    Yes Historical Provider, MD  oxyCODONE-acetaminophen (PERCOCET/ROXICET) 5-325 MG tablet Take 1 tablet by mouth every 6 (six) hours as needed for moderate pain or severe pain.  01/03/15  Yes Historical Provider, MD  temazepam (RESTORIL) 15 MG capsule Take 15 mg by mouth at bedtime.    Yes Historical Provider, MD  Tiotropium Bromide Monohydrate (SPIRIVA RESPIMAT) 2.5 MCG/ACT AERS Inhale 2 puffs into the lungs daily.   Yes Historical Provider, MD  verapamil (COVERA HS) 180 MG (CO) 24 hr tablet Take 1 tablet (180 mg total) by mouth at bedtime. 01/16/13  Yes Evans Lance, MD  XOPENEX HFA 45 MCG/ACT inhaler 1-2 puffs every 6 (six) hours as needed for shortness of breath.  09/10/14  Yes Historical Provider, MD  zolpidem (AMBIEN) 10 MG tablet Take 10 mg by mouth at bedtime as needed for sleep.    Yes Historical Provider, MD   Allergies  Allergen Reactions  . Sulfonamide Derivatives Other (See Comments)    Unk    FAMILY HISTORY:  family history includes Coronary artery disease in his brother; Heart failure in his brother; Leukemia in his brother; Lung cancer (age of onset: 97) in his father; Stroke (age of onset: 43) in his mother. SOCIAL HISTORY:  reports that he quit  smoking about 58 years ago. His smoking use included Cigarettes. He has a 26 pack-year smoking history. He has never used smokeless tobacco. He reports that he drinks alcohol. He reports that he does not use illicit drugs.  REVIEW OF SYSTEMS:   All negative; except for those that are bolded, which indicate positives.  Constitutional: weight loss, weight gain, night sweats, fevers, chills, fatigue, weakness.  HEENT: headaches, sore throat, sneezing, nasal congestion, post nasal drip, difficulty swallowing, tooth/dental problems, visual complaints, visual changes, ear aches. Neuro: difficulty with speech, weakness, numbness, ataxia. CV:  chest pain, orthopnea, PND, swelling in lower extremities, dizziness, palpitations, syncope.  Resp: cough, hemoptysis, dyspnea, wheezing. GI  heartburn, indigestion, abdominal pain, nausea, vomiting, diarrhea, constipation, change in bowel habits, loss of appetite, hematemesis, melena, hematochezia.  GU: dysuria, change in color of urine, urgency or frequency, flank pain, hematuria. MSK: joint pain or swelling, decreased range of motion. Psych: change in mood or affect, depression, anxiety, suicidal ideations, homicidal ideations. Skin: rash, itching, bruising.   SUBJECTIVE:  Denies any further frank hemoptysis.  Had 1 episode of coughing this morning where he coughed up thick phlegm that had a small amount  of dark blood.  Besides that 1 episode, no further. Denies chest pain.  Still having SOB mainly with exertion.  Is anxious to get out of bed and ambulate and states diuresis has helped his dyspnea and is asking for more lasix.  VITAL SIGNS: Temp:  [97.6 F (36.4 C)-97.9 F (36.6 C)] 97.9 F (36.6 C) (10/11 0449) Pulse Rate:  [70-90] 80 (10/11 0449) Resp:  [14-26] 18 (10/11 0449) BP: (112-140)/(54-86) 112/54 mmHg (10/11 0449) SpO2:  [92 %-100 %] 96 % (10/11 0826) FiO2 (%):  [0 %] 0 % (10/11 0148) Weight:  [77.202 kg (170 lb 3.2 oz)] 77.202 kg (170 lb  3.2 oz) (10/10 2314)  PHYSICAL EXAMINATION: General: Pleasant elderly male, resting in bed, in NAD. Neuro: A&O x 3, non-focal.  HEENT: [place]/AT. PERRL, sclerae anicteric. Cardiovascular: IRIR, no M/R/G.  Lungs: Respirations even and unlabored.  Diminished left base.  Expiratory wheezes bilaterally. Abdomen: BS x 4, soft, NT/ND.  Musculoskeletal: No gross deformities, no edema.  Skin: Intact, warm, no rashes.     Recent Labs Lab 01/06/15 2058 01/07/15 0215  NA 125* 130*  K 4.7 4.5  CL 92* 97*  CO2 24 26  BUN 21* 20  CREATININE 0.89 0.91  GLUCOSE 123* 126*    Recent Labs Lab 01/06/15 2058 01/07/15 0215  HGB 8.9* 8.5*  HCT 27.4* 25.8*  WBC 9.4 9.7  PLT 191 167   Dg Chest 2 View  01/06/2015   CLINICAL DATA:  Acute shortness of breath for 3 days. Patient with lung cancer.  EXAM: CHEST  2 VIEW  COMPARISON:  None.  FINDINGS: Cardiomediastinal silhouette is unchanged.  Increased opacity in the area of patient's known superior segment left lower lobe malignancy. This may represent adjacent hemorrhage, post obstructive inflammation or infection.  Left lower lung airspace disease/ atelectasis and new small left pleural effusion noted.  A trace right pleural effusion is again noted.  Emphysema/ COPD again noted.  There is no evidence of pneumothorax.  IMPRESSION: Increased opacity in the area the patient's known superior segment left lower lobe malignancy which may represent adjacent hemorrhage, postobstructive inflammation or infection.  Left lower lung airspace disease/ atelectasis and new small left pleural effusion.  Emphysema/COPD.   Electronically Signed   By: Margarette Canada M.D.   On: 01/06/2015 18:42   Ct Chest W Contrast  01/07/2015   CLINICAL DATA:  Shortness of breath starting yesterday. Recent cryoablation for bronchogenic carcinoma on Thursday with subsequent thoracentesis at [facility]. Worsening cough today.  EXAM: CT CHEST WITH CONTRAST  TECHNIQUE: Multidetector CT  imaging of the chest was performed during intravenous contrast administration.  CONTRAST:  169m OMNIPAQUE IOHEXOL 300 MG/ML  SOLN  COMPARISON:  11/29/2014  FINDINGS: The pulmonary nodule previously identified in the superior segment of left lower lobe now appears larger and more diffuse with internal gas collections. This is likely result of interval cryoablation procedure. This can serve as a baseline for follow-up imaging to assess for response to therapy. Since the previous study, there is interval development of a small to moderate left pleural effusion with somewhat increased density suggesting hemorrhagic effusion. There is atelectasis in the left lower lung. Minimal right pleural effusion and basilar atelectasis. Bronchiectasis, greater on the right. Mild scarring in the right lung. No focal consolidation. No pneumothorax.  Normal heart size. Normal caliber thoracic aorta. No aortic dissection. Scattered aortic calcification. Visualized central pulmonary arteries are well opacified without evidence of large pulmonary embolus. Esophagus is decompressed. Mediastinal and  hilar lymph nodes are not pathologically enlarged. Fatty tumor anterior to the tip of the scapula in the right lateral chest consistent lipoma.  Included portions of the upper abdominal organs demonstrate calcified granulomas in the liver and spleen. Degenerative changes in the spine. Compression of the L1 vertebra without change since previous study.  IMPRESSION: Nodule in the superior segment of left lower lobe has become more diffuse with focal gas collections. This is likely result of interval therapy. There is interval development of left pleural effusion, probably hemorrhagic, with atelectasis in the left lower lung. Small right pleural effusion. No pneumothorax. Chronic bronchiectasis mostly on the right with associated scarring.   Electronically Signed   By: Lucienne Capers M.D.   On: 01/07/2015 01:14   Ultrasound of Left Pleural  Space 01/07/15:          ASSESSMENT / PLAN:  Dyspnea. Hx asthma and Loeffler's syndrome. Plan: Diuresis as able, additional dose this AM. Monitor I/O's. Follow echo. Continue BD's. Add steroids for bronchospasm. CXR in AM.  Pleural effusion - s/p left thoracetesis 01/03/15; now recurred (see images above). Plan: Will discuss with attending for possible thoracentesis.  Concern for HCAP / infectious process in light of recent multiple interventions / instrumentation. Plan: Start Vanc / Zosyn. Check PCT. Monitor clinically. CXR in AM.  Hemoptysis - in setting of recently diagnosed NSCLC s/p cryoablation and chronic anticoagulation for PAF. Plan: Continue to hold apixaban. Evaluate for further episodes of hemoptysis.  Non-small cell lung cancer - s/p cryoablation of LLL 01/02/15 at [facility]. Plan: Outpatient follow up.   Montey Hora, [place] [facility] Pager: [phone]  or [phone] 01/07/2015, 10:25 AM    Attending:  I have seen and examined the patient with nurse practitioner/resident and agree with the note above.   Mr. Lewers is well known to me.  After [name] diagnosed him with a bronchoscopy Mr. Mielke had cryoablation performed on his left upper lobe NSCLC on 10/7.  This was complicated by a hemothorax the following day.  He then developed worsening dyspnea, cough and wheezing on 10/11.  No fever or chills.  He resumed Eliquis on 10/8, then held it again on 10/9.  On exam: diminished breath sounds left base, mild wheezing  CT images reviewed: lots of ground glass and atelectasis around his mass in the left upper lobe with a moderate sized effusion  Impression/Plan: His acute dyspnea is likely due in part to the pleural effusion (hemothorax) which came back on Eliquis.  This is a procedural related complication.  I don't think that the effusion is malignant.  The consolidation/ggo in his lungs is likely inflammatory and  also procedural related, but could be HCAP vs related to his long standing Loffler's syndrome.  He does not desire chest surgery of any kind.  -thoracentesis now> therapeutic and diagnostic, will send appropriate studies -plan repeat CXR 10/13 -hold eliquis -could restart ASA later this week if no recurrence of effusion -start HCAP treatment -ambulate as able -will follow  Roselie Awkward, MD [place] [facility] Pager: [phone] Cell: [phone] After 3pm or if no response, call [phone]

## 2015-01-07 NOTE — Progress Notes (Signed)
Utilization review completed.  

## 2015-01-07 NOTE — Progress Notes (Signed)
PROGRESS NOTE    [name] SEG:[id number] DOB: 03-15-1925 DOA: 01/06/2015 PCP: [name], MD    HPI/Brief narrative 81 year old male with history of PAF on Apixaban, recently diagnosed NSCLC s/p cryoablation of LLL at [facility] 01/02/15, left pleural effusion s/p thoracentesis 960 mL on 10/7 and resumed on Apixaban the following day, HTN, COPD, Loeffler syndrome, presented to [facility] ED on 01/06/15 with 2 days history of worsening dyspnea and on and off hemoptysis. In ED, CT chest showed left lower lobe nodule with focal gas collections and interval development of left pleural effusion, probably hemorrhagic. Pulmonology was consulted and patient underwent 700 mL of bloody pleural fluid by thoracentesis.   Assessment/Plan:  Acute hypoxic respiratory failure - Multifactorial from recurrent left pleural effusion, COPD exacerbation and possible CHF - Received a dose of Lasix 20 mg IV 1 on 10/11. Follow 2-D echo - Pulmonology was consulted and patient underwent left thoracentesis yielding 700 mL of bloody fluid. - Continue oxygen, bronchodilators and steroids added for COPD.  Left pleural effusion-hemorrhagic, recurrent - Status post thoracentesis yielding 700 mL bloody fluid on 10/11  COPD exacerbation - Oxygen, bronchodilators and steroids added. Pulmonary input appreciated.  Possible acute CHF-presumed diastolic - Treated with a dose of IV Lasix 20 mg 1 on 10/11. When necessary Lasix as blood pressure and renal functions tolerated. - Follow 2-D echo. - Minimally elevated troponin of 0.06 likely from demand ischemia related to dyspnea. Cycle troponin.  Recently diagnosed NSCLS  - Status post cryoablation of LLL at [facility] on 10/6 - Outpatient follow-up.  Paroxysmal A. Fib - Chads 2 vasc score is 3 if patient has CHF. Apixaban is on hold due to hemoptysis.  Anemia - Stable compared to 12 hours ago. Follow CBC in a.m. and transfuse if hemoglobin less than 77 g per DL  Asthma &  Loeffler syndrome - Management as above.  Concern for HCAP - As per pulmonology input, concern for healthcare associated pneumonia in light of recent multiple interventions/instrumentation. Checking pro-calcitonin and have started vancomycin and Zosyn.  Hemoptysis in recently diagnosed NSCLS - Hold anticoagulation for now  Hyponatremia - ? SIADH. Follow BMP in a.m. after diuretics. Improved compared to last night from 125 >130  DVT prophylaxis: SCDs Code Status: Full Family Communication: None at bedside Disposition Plan: DC home when medically stable, possibly in 3-4 days   Consultants:  Pulmonology  Procedures:  Left therapeutic thoracentesis 01/07/15  Antibiotics:  IV Zosyn 01/07/15 >  IV vancomycin 01/07/15 >   Subjective: States that he did not sleep well last night. Dyspnea may be slightly better. No hemoptysis since admission. Denies chest pain. Not on home oxygen. Dyspnea on minimal exertion. Patient was seen prior to thoracentesis this morning.  Objective: Filed Vitals:   01/07/15 0500 01/07/15 0815 01/07/15 0826 01/07/15 1418  BP:    109/56  Pulse:    83  Temp:    98.1 F (36.7 C)  TempSrc:    Oral  Resp:    16  Height: '5\' 10"'$  (1.778 m)     Weight:      SpO2:  95% 96% 98%    Intake/Output Summary (Last 24 hours) at 01/07/15 1451 Last data filed at 01/07/15 1400  Gross per 24 hour  Intake    480 ml  Output   2600 ml  Net  -2120 ml   Filed Weights   01/06/15 2314  Weight: 77.202 kg (170 lb 3.2 oz)     Exam:  General exam: Pleasant  elderly male lying comfortably propped up in bed. Respiratory system: Reduced breath sounds in the bases, left >right with occasional basal crackles and scattered bilateral expiratory rhonchi. No increased work of breathing. Cardiovascular system: S1 & S2 heard, RRR. No JVD, murmurs, gallops, clicks or pedal edema. Telemetry: A. fib with controlled ventricular rate. Gastrointestinal system: Abdomen is nondistended,  soft and nontender. Normal bowel sounds heard. Central nervous system: Alert and oriented. No focal neurological deficits. Extremities: Symmetric 5 x 5 power.   Data Reviewed: Basic Metabolic Panel:  Recent Labs Lab 01/06/15 2058 01/07/15 0215  NA 125* 130*  K 4.7 4.5  CL 92* 97*  CO2 24 26  GLUCOSE 123* 126*  BUN 21* 20  CREATININE 0.89 0.91  CALCIUM 9.0 8.9   Liver Function Tests:  Recent Labs Lab 01/07/15 0215 01/07/15 1335  AST 52*  --   ALT 32  --   ALKPHOS 60  --   BILITOT 0.8  --   PROT 5.7* 6.0*  ALBUMIN 3.0*  --    No results for input(s): LIPASE, AMYLASE in the last 168 hours. No results for input(s): AMMONIA in the last 168 hours. CBC:  Recent Labs Lab 01/06/15 2058 01/07/15 0215  WBC 9.4 9.7  NEUTROABS 8.1* 7.8*  HGB 8.9* 8.5*  HCT 27.4* 25.8*  MCV 92.6 92.5  PLT 191 167   Cardiac Enzymes:  Recent Labs Lab 01/07/15 0215  TROPONINI 0.06*   BNP (last 3 results) No results for input(s): PROBNP in the last 8760 hours. CBG: No results for input(s): GLUCAP in the last 168 hours.  No results found for this or any previous visit (from the past 240 hour(s)).         Studies: Dg Chest 2 View  01/06/2015   CLINICAL DATA:  Acute shortness of breath for 3 days. Patient with lung cancer.  EXAM: CHEST  2 VIEW  COMPARISON:  None.  FINDINGS: Cardiomediastinal silhouette is unchanged.  Increased opacity in the area of patient's known superior segment left lower lobe malignancy. This may represent adjacent hemorrhage, post obstructive inflammation or infection.  Left lower lung airspace disease/ atelectasis and new small left pleural effusion noted.  A trace right pleural effusion is again noted.  Emphysema/ COPD again noted.  There is no evidence of pneumothorax.  IMPRESSION: Increased opacity in the area the patient's known superior segment left lower lobe malignancy which may represent adjacent hemorrhage, postobstructive inflammation or infection.   Left lower lung airspace disease/ atelectasis and new small left pleural effusion.  Emphysema/COPD.   Electronically Signed   By: Margarette Canada M.D.   On: 01/06/2015 18:42   Ct Chest W Contrast  01/07/2015   CLINICAL DATA:  Shortness of breath starting yesterday. Recent cryoablation for bronchogenic carcinoma on Thursday with subsequent thoracentesis at [facility]. Worsening cough today.  EXAM: CT CHEST WITH CONTRAST  TECHNIQUE: Multidetector CT imaging of the chest was performed during intravenous contrast administration.  CONTRAST:  128m OMNIPAQUE IOHEXOL 300 MG/ML  SOLN  COMPARISON:  11/29/2014  FINDINGS: The pulmonary nodule previously identified in the superior segment of left lower lobe now appears larger and more diffuse with internal gas collections. This is likely result of interval cryoablation procedure. This can serve as a baseline for follow-up imaging to assess for response to therapy. Since the previous study, there is interval development of a small to moderate left pleural effusion with somewhat increased density suggesting hemorrhagic effusion. There is atelectasis in the left lower lung.  Minimal right pleural effusion and basilar atelectasis. Bronchiectasis, greater on the right. Mild scarring in the right lung. No focal consolidation. No pneumothorax.  Normal heart size. Normal caliber thoracic aorta. No aortic dissection. Scattered aortic calcification. Visualized central pulmonary arteries are well opacified without evidence of large pulmonary embolus. Esophagus is decompressed. Mediastinal and hilar lymph nodes are not pathologically enlarged. Fatty tumor anterior to the tip of the scapula in the right lateral chest consistent lipoma.  Included portions of the upper abdominal organs demonstrate calcified granulomas in the liver and spleen. Degenerative changes in the spine. Compression of the L1 vertebra without change since previous study.  IMPRESSION: Nodule in the superior segment  of left lower lobe has become more diffuse with focal gas collections. This is likely result of interval therapy. There is interval development of left pleural effusion, probably hemorrhagic, with atelectasis in the left lower lung. Small right pleural effusion. No pneumothorax. Chronic bronchiectasis mostly on the right with associated scarring.   Electronically Signed   By: Lucienne Capers M.D.   On: 01/07/2015 01:14   Dg Chest Port 1 View  01/07/2015   CLINICAL DATA:  Left pleural effusion.  EXAM: PORTABLE CHEST 1 VIEW  COMPARISON:  CT chest January 07, 2015, chest x-ray January 06, 2015  FINDINGS: The mediastinal contour and cardiac silhouette are stable. There is left pleural effusion unchanged compared to prior exam. There is opacity in the superior segment of the left lower lobe unchanged compared to prior chest x-ray consistent with patient's known lung cancer. The visualized skeletal structures are stable.  IMPRESSION: Left pleural effusion is unchanged.   Electronically Signed   By: Abelardo Diesel M.D.   On: 01/07/2015 14:40        Scheduled Meds: . celecoxib  200 mg Oral QHS  . finasteride  5 mg Oral Daily  . fluticasone  2 spray Each Nare Daily  . loratadine  10 mg Oral QHS  . methylPREDNISolone (SOLU-MEDROL) injection  40 mg Intravenous Q12H  . mometasone-formoterol  2 puff Inhalation BID  . montelukast  10 mg Oral QHS  . pantoprazole  40 mg Oral Daily  . piperacillin-tazobactam (ZOSYN)  IV  3.375 g Intravenous Q8H  . rosuvastatin  5 mg Oral QHS  . sodium chloride  3 mL Intravenous Q12H  . temazepam  15 mg Oral QHS  . tiotropium  18 mcg Inhalation Daily  . vancomycin  750 mg Intravenous Q12H  . verapamil  180 mg Oral QHS   Continuous Infusions:   Principal Problem:   Acute respiratory failure with hypoxia (HCC) Active Problems:   Asthma   PAF (paroxysmal atrial fibrillation) (HCC)   Acute respiratory failure (HCC)    Time spent: 35 minutes    Lailani Tool,  MD, FACP, FHM. [facility] Pager [phone]  If 7PM-7AM, please contact night-coverage [email or web address] Password [email or web address] 01/07/2015, 2:51 PM    LOS: 1 day

## 2015-01-07 NOTE — Progress Notes (Signed)
ANTIBIOTIC CONSULT NOTE - INITIAL  Pharmacy Consult for Vancomycin and Zosyn Indication: pneumonia  Allergies  Allergen Reactions  . Sulfonamide Derivatives Other (See Comments)    Unk    Patient Measurements: Height: '5\' 10"'$  (177.8 cm) Weight: 170 lb 3.2 oz (77.202 kg) IBW/kg (Calculated) : 73  Vital Signs: Temp: 97.9 F (36.6 C) (10/11 0449) Temp Source: Oral (10/11 0449) BP: 112/54 mmHg (10/11 0449) Pulse Rate: 80 (10/11 0449) Intake/Output from previous day: 10/10 0701 - 10/11 0700 In: 120 [P.O.:120] Out: 1300 [Urine:1300] Intake/Output from this shift: Total I/O In: 240 [P.O.:240] Out: 250 [Urine:250]  Labs:  Recent Labs  01/06/15 2058 01/07/15 0215  WBC 9.4 9.7  HGB 8.9* 8.5*  PLT 191 167  CREATININE 0.89 0.91   Estimated Creatinine Clearance: 57.9 mL/min (by C-G formula based on Cr of 0.91). No results for input(s): VANCOTROUGH, VANCOPEAK, VANCORANDOM, GENTTROUGH, GENTPEAK, GENTRANDOM, TOBRATROUGH, TOBRAPEAK, TOBRARND, AMIKACINPEAK, AMIKACINTROU, AMIKACIN in the last 72 hours.   Microbiology: No results found for this or any previous visit (from the past 720 hour(s)).  Medical History: Past Medical History  Diagnosis Date  . Bradycardia   . Palpitations   . Asthma   . Hemorrhoids   . Hx of adenomatous colonic polyps 02/1997  . Atrial fibrillation ([place])   . Hypertension   . 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 Diverticulosis of colon   . COPD (chronic obstructive pulmonary disease) (HCC)     mild  . Bowel obstruction ([place])   . Arthritis     osteoarthritis  . Cancer ([place])     skin cancer  . Loffler's syndrome [facility])    Assessment: 81 year old male with recently diagnosed NSCLC s/p cryoablation of LLL at [facility] 01/02/15, now presenting to [facility] with exertional SOB and hemoptysis To begin Vancomycin and Zosyn for possible HCAP  Goal of Therapy:  Vancomycin trough level 15-20 mcg/ml  Appropriate Zosyn dosing  Plan:  Zosyn 3.375 grams iv Q 8 hours -  4 hr infusion Vancomycin 750 mg iv Q 12 hours Follow up fever trend, Scr, cultures, progress  Thank you Anette Guarneri, PharmD [phone]  01/07/2015,12:54 PM

## 2015-01-07 NOTE — ED Provider Notes (Signed)
Pt with history of lung ca.  Recent procedure at [facility].  Progressive shortness of breath recently. Physical Exam  BP 112/54 mmHg  Pulse 80  Temp(Src) 97.9 F (36.6 C) (Oral)  Resp 18  Ht '5\' 10"'$  (1.778 m)  Wt 170 lb 3.2 oz (77.202 kg)  BMI 24.42 kg/m2  SpO2 96%  Physical Exam  Constitutional: No distress.  HENT:  Head: Normocephalic and atraumatic.  Right Ear: External ear normal.  Left Ear: External ear normal.  Eyes: Conjunctivae are normal. Right eye exhibits no discharge. Left eye exhibits no discharge. No scleral icterus.  Neck: Neck supple. No tracheal deviation present.  Cardiovascular: Normal rate.   Pulmonary/Chest: Effort normal. No stridor. No respiratory distress. He has wheezes. He has rales.  Slight decrease breath sounds left base  Musculoskeletal: He exhibits no edema.  Neurological: He is alert. Cranial nerve deficit: no gross deficits.  Skin: Skin is warm and dry. No rash noted.  Psychiatric: He has a normal mood and affect.  Nursing note and vitals reviewed.   ED Course  Procedures  EKG Interpretation  Date/Time:  Monday January 06 2015 17:39:04 EDT Ventricular Rate:  82 PR Interval:    QRS Duration: 86 QT Interval:  374 QTC Calculation: 436 R Axis:   106 Text Interpretation:  Atrial fibrillation Rightward axis Anterior infarct , age undetermined Abnormal ECG No significant change since last tracing Confirmed by Antwuan Eckley  MD-J, Margi Edmundson ([id number]) on 01/07/2015 12:15:19 AM       MDM New infiltrate on CXR.  Small effusion on CXR not likely to be symptomatic.  Worsening anemia on labs.  Dyspnea may be multifactorial.  Consider treatment of possible PNA.  MOnitor stools for occult blood loss.  Admit for further evaluation.  I saw and evaluated the patient, reviewed the resident's note and I agree with the findings and plan.        Dorie Rank, MD 01/07/15 [phone]

## 2015-01-07 NOTE — Procedures (Addendum)
Thoracentesis Procedure Note  Pre-operative Diagnosis: Pleural effusion, left  Post-operative Diagnosis: same  Indications: Shortness of breath  Procedure Details  Consent: Informed consent was obtained. Risks of the procedure were discussed including: infection, bleeding, pain, pneumothorax.  Ultrasound used for procedural guidance.  Under sterile conditions the patient was positioned. Betadine solution and sterile drapes were utilized.  1% buffered lidocaine was used to anesthetize the 10th rib space. Fluid was obtained without any difficulties and minimal blood loss.  A dressing was applied to the wound and wound care instructions were provided.   Findings 700 ml of bloody pleural fluid was obtained. A sample was sent to Pathology for cytogenetics, flow, and cell counts, as well as for infection analysis.  Complications:  None; patient tolerated the procedure well.          Condition: stable  Plan A follow up chest x-ray was ordered. Bed Rest for 0 hours. Tylenol 650 mg. for pain.  Attending Attestation: Procedure performed by Montey Hora, PA; I was present and scrubbed for the entire procedure.  Roselie Awkward, MD [facility] Pager: [phone] Cell: [phone] After 3pm or if no response, call [phone]

## 2015-01-07 NOTE — H&P (Signed)
[facility] History and Physical  [place] Code [place] HCW:[id number] DOB: 1924-10-02 DOA: 01/06/2015  Referring physician: Dr.Beaty. PCP: [name], MD  Specialists: Patient follows up at [facility] for recently diagnosed lung cancer.  Chief Complaint: Shortness of breath.  HPI: [name] is a 81 y.o. male with history of paroxysmal atrial fibrillation on Apixaban, recently diagnosed non-small cell lung cancer and was treated on 01/02/2015 with cryoablation following which patient had left-sided hemothorax which was drained with thoracentesis on October 7 and patient was restarted on Apixaban the following day started developing shortness of breath on exertion over the last 2 days. Denies any fever chills or chest pain. Patient has been having some hemoptysis off and on last 2 days. In the ER chest x-ray shows increasing opacity at the patient's known lung cancer area with possible inflammation versus infection was a hemorrhage. CT chest was done which shows pleural effusion. With the gas collection around the known cancer area of which had recent cryoablation. On exam patient has elevated JVD and also has lower extremity edema. On exam patient also has bilateral crackles. Patient admitted for acute hypoxic respiratory failure.  Review of Systems: As presented in the history of presenting illness, rest negative.  Past Medical History  Diagnosis Date  . Bradycardia   . Palpitations   . Asthma   . Hemorrhoids   . Hx of adenomatous colonic polyps 02/1997  . Atrial fibrillation ([place])   . Hypertension   . 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 Diverticulosis of colon   . COPD (chronic obstructive pulmonary disease) (HCC)     mild  . Bowel obstruction ([place])   . Arthritis     osteoarthritis  . Cancer ([place])     skin cancer  . 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 Colon resection    . Total hip arthroplasty Right   . Appendectomy    .  Laminectomy      Right L4-L5 Laminectomy  . Cystoscopy    . Rotator cuff repair Right   . Umbilical hernia repair    . Video bronchoscopy with endobronchial navigation N/A 12/18/2014    Procedure: VIDEO BRONCHOSCOPY WITH ENDOBRONCHIAL NAVIGATION ;  Surgeon: Collene Gobble, MD;  Location: [place];  Service: Thoracic;  Laterality: N/A;   Social History:  reports that he quit smoking about 58 years ago. His smoking use included Cigarettes. He has a 26 pack-year smoking history. He has never used smokeless tobacco. He reports that he drinks alcohol. He reports that he does not use illicit drugs. Where does patient live home. Can patient participate in ADLs? Yes.  Allergies  Allergen Reactions  . Sulfonamide Derivatives Other (See Comments)    Unk    Family History:  Family History  Problem Relation Age of Onset  . Lung cancer Father 35  . Stroke Mother 73  . Leukemia Brother   . Coronary artery disease Brother   . Heart failure Brother       Prior to Admission medications   Medication Sig Start Date End Date Taking? Authorizing Provider  albuterol (PROVENTIL HFA;VENTOLIN HFA) 108 (90 BASE) MCG/ACT inhaler Inhale 1-2 puffs into the lungs every 6 (six) hours as needed for wheezing or shortness of breath.   Yes Historical Provider, MD  apixaban (ELIQUIS) 5 MG TABS tablet Take 1 tablet (5 mg total) by mouth 2 (two) times daily. Patient taking differently: Take 2.5 mg by mouth 3 (  three) times daily.  11/22/12  Yes Evans Lance, MD  celecoxib (CELEBREX) 200 MG capsule Take 200 mg by mouth at bedtime.    Yes Historical Provider, MD  Cetirizine HCl (ZYRTEC ALLERGY PO) Take 10 mg by mouth at bedtime.    Yes Historical Provider, MD  CRESTOR 10 MG tablet Take 5 mg by mouth at bedtime.  08/19/14  Yes Historical Provider, MD  finasteride (PROSCAR) 5 MG tablet Take 5 mg by mouth daily.     Yes Historical Provider, MD  fluticasone (FLONASE) 50 MCG/ACT nasal spray Place 2 sprays into the nose daily.      Yes Historical Provider, MD  Fluticasone-Salmeterol (ADVAIR DISKUS) 250-50 MCG/DOSE AEPB Inhale 1 puff into the lungs 2 (two) times daily.     Yes Historical Provider, MD  furosemide (LASIX) 20 MG tablet Take 20 mg by mouth daily as needed for fluid.  12/25/14  Yes Historical Provider, MD  Guaifenesin 1200 MG TB12 Take 1,200 mg by mouth 2 (two) times daily.   Yes Historical Provider, MD  methylPREDNISolone (MEDROL DOSEPAK) 4 MG TBPK tablet Take by mouth as directed.  01/05/15  Yes Historical Provider, MD  montelukast (SINGULAIR) 10 MG tablet Take 10 mg by mouth at bedtime.     Yes Historical Provider, MD  omeprazole (PRILOSEC) 20 MG capsule Take 20 mg by mouth every evening.    Yes Historical Provider, MD  oxyCODONE-acetaminophen (PERCOCET/ROXICET) 5-325 MG tablet Take 1 tablet by mouth every 6 (six) hours as needed for moderate pain or severe pain.  01/03/15  Yes Historical Provider, MD  temazepam (RESTORIL) 15 MG capsule Take 15 mg by mouth at bedtime.    Yes Historical Provider, MD  Tiotropium Bromide Monohydrate (SPIRIVA RESPIMAT) 2.5 MCG/ACT AERS Inhale 2 puffs into the lungs daily.   Yes Historical Provider, MD  verapamil (COVERA HS) 180 MG (CO) 24 hr tablet Take 1 tablet (180 mg total) by mouth at bedtime. 01/16/13  Yes Evans Lance, MD  XOPENEX HFA 45 MCG/ACT inhaler 1-2 puffs every 6 (six) hours as needed for shortness of breath.  09/10/14  Yes Historical Provider, MD  zolpidem (AMBIEN) 10 MG tablet Take 10 mg by mouth at bedtime as needed for sleep.    Yes Historical Provider, MD    Physical Exam: Filed Vitals:   01/06/15 2130 01/06/15 2200 01/06/15 2239 01/06/15 2314  BP: 137/66 132/68  136/80  Pulse: 81 84 90 79  Temp:    97.7 F (36.5 C)  TempSrc:    Oral  Resp: '19 26 17 18  '$ Weight:    77.202 kg (170 lb 3.2 oz)  SpO2: 95% 95% 92% 99%     General:  Moderately built and nourished.  Eyes: Anicteric no pallor.  ENT: No discharge from the ears eyes nose and mouth.  Neck:  Elevated JVD.  Cardiovascular: S1-S2 heard.  Respiratory: Bilateral crackles nor rhonchi.  Abdomen: Soft nontender bowel sounds present.  Skin: No rash.  Musculoskeletal: Lower extremity are more on the right side.  Psychiatric: Appears normal.  Neurologic: Alert awake oriented to time place and person. Moves all extremities.  Labs on Admission:  Basic Metabolic Panel:  Recent Labs Lab 01/06/15 2058  NA 125*  K 4.7  CL 92*  CO2 24  GLUCOSE 123*  BUN 21*  CREATININE 0.89  CALCIUM 9.0   Liver Function Tests: No results for input(s): AST, ALT, ALKPHOS, BILITOT, PROT, ALBUMIN in the last 168 hours. No results for input(s): LIPASE, AMYLASE  in the last 168 hours. No results for input(s): AMMONIA in the last 168 hours. CBC:  Recent Labs Lab 01/06/15 2058  WBC 9.4  NEUTROABS 8.1*  HGB 8.9*  HCT 27.4*  MCV 92.6  PLT 191   Cardiac Enzymes: No results for input(s): CKTOTAL, CKMB, CKMBINDEX, TROPONINI in the last 168 hours.  BNP (last 3 results) No results for input(s): BNP in the last 8760 hours.  ProBNP (last 3 results) No results for input(s): PROBNP in the last 8760 hours.  CBG: No results for input(s): GLUCAP in the last 168 hours.  Radiological Exams on Admission: Dg Chest 2 View  01/06/2015   CLINICAL DATA:  Acute shortness of breath for 3 days. Patient with lung cancer.  EXAM: CHEST  2 VIEW  COMPARISON:  None.  FINDINGS: Cardiomediastinal silhouette is unchanged.  Increased opacity in the area of patient's known superior segment left lower lobe malignancy. This may represent adjacent hemorrhage, post obstructive inflammation or infection.  Left lower lung airspace disease/ atelectasis and new small left pleural effusion noted.  A trace right pleural effusion is again noted.  Emphysema/ COPD again noted.  There is no evidence of pneumothorax.  IMPRESSION: Increased opacity in the area the patient's known superior segment left lower lobe malignancy which may  represent adjacent hemorrhage, postobstructive inflammation or infection.  Left lower lung airspace disease/ atelectasis and new small left pleural effusion.  Emphysema/COPD.   Electronically Signed   By: Margarette Canada M.D.   On: 01/06/2015 18:42   Ct Chest W Contrast  01/07/2015   CLINICAL DATA:  Shortness of breath starting yesterday. Recent cryoablation for bronchogenic carcinoma on Thursday with subsequent thoracentesis at [facility]. Worsening cough today.  EXAM: CT CHEST WITH CONTRAST  TECHNIQUE: Multidetector CT imaging of the chest was performed during intravenous contrast administration.  CONTRAST:  164m OMNIPAQUE IOHEXOL 300 MG/ML  SOLN  COMPARISON:  11/29/2014  FINDINGS: The pulmonary nodule previously identified in the superior segment of left lower lobe now appears larger and more diffuse with internal gas collections. This is likely result of interval cryoablation procedure. This can serve as a baseline for follow-up imaging to assess for response to therapy. Since the previous study, there is interval development of a small to moderate left pleural effusion with somewhat increased density suggesting hemorrhagic effusion. There is atelectasis in the left lower lung. Minimal right pleural effusion and basilar atelectasis. Bronchiectasis, greater on the right. Mild scarring in the right lung. No focal consolidation. No pneumothorax.  Normal heart size. Normal caliber thoracic aorta. No aortic dissection. Scattered aortic calcification. Visualized central pulmonary arteries are well opacified without evidence of large pulmonary embolus. Esophagus is decompressed. Mediastinal and hilar lymph nodes are not pathologically enlarged. Fatty tumor anterior to the tip of the scapula in the right lateral chest consistent lipoma.  Included portions of the upper abdominal organs demonstrate calcified granulomas in the liver and spleen. Degenerative changes in the spine. Compression of the L1 vertebra without  change since previous study.  IMPRESSION: Nodule in the superior segment of left lower lobe has become more diffuse with focal gas collections. This is likely result of interval therapy. There is interval development of left pleural effusion, probably hemorrhagic, with atelectasis in the left lower lung. Small right pleural effusion. No pneumothorax. Chronic bronchiectasis mostly on the right with associated scarring.   Electronically Signed   By: WLucienne CapersM.D.   On: 01/07/2015 01:14    EKG: Independently reviewed. Atrial fibrillation rate  controlled.  Assessment/Plan Principal Problem:   Acute respiratory failure with hypoxia (HCC) Active Problems:   Asthma   PAF (paroxysmal atrial fibrillation) (HCC)   Acute respiratory failure ([place])   1. Acute hypoxic respiratory failure - given the exertional symptoms and elevated JVD and lower extremity edema I suspect patient has CHF. I have ordered Lasix 20 mg IV 1 dose now and based on response we will have further doses. Check 2-D echo daily weights and intake output. 2. Recently diagnosed non-small cell lung cancer status post cryoablation at [facility] and required left-sided thoracentesis for left hemothorax - since patient still has mild hemoptysis and CT chest shows mild left pleural effusion I am holding off patient's Apixaban for now. Closely monitor. 3. Anemia with recent left hemothorax - holding off Apixaban due to ongoing mild hemoptysis. Follow CBC. 4. Paroxysmal atrial fibrillation - presently in A. fib. Chads 2 vasc score is 3 if patient has CHF. Apixaban is on hold due to hemoptysis. 5. History of asthma and Loeffler's syndrome - no active wheezing at this time. I'll continue patient on his inhalers and patient's home medications.  I have reviewed patient's old charts on labs. Personally reviewed patient's chest x-ray and EKG. I have ordered Doppler of the lower extremity because patient has more fluid on the right lower  extremity than the left.   DVT Prophylaxis SCDs.  Code Status: Full code.  Family Communication: Discussed patient.  Disposition Plan: Admit to inpatient.    Gavinn Collard N. [facility] Pager [phone].  If 7PM-7AM, please contact night-coverage [email or web address] Password [email or web address] 01/07/2015, 1:49 AM

## 2015-01-07 NOTE — Progress Notes (Signed)
VASCULAR LAB PRELIMINARY  PRELIMINARY  PRELIMINARY  PRELIMINARY  Bilateral lower extremity venous duplex  completed.    Preliminary report:  Bilateral:  No evidence of DVT.  Short segment of localized superficial thrombus of indeterminate age in the GSV of the mid calf bilaterally.  [name], RVT 01/07/2015, 11:03 AM

## 2015-01-08 ENCOUNTER — Inpatient Hospital Stay (HOSPITAL_COMMUNITY): Payer: Medicare Other

## 2015-01-08 DIAGNOSIS — I480 Paroxysmal atrial fibrillation: Secondary | ICD-10-CM

## 2015-01-08 DIAGNOSIS — J942 Hemothorax: Secondary | ICD-10-CM

## 2015-01-08 DIAGNOSIS — R0602 Shortness of breath: Secondary | ICD-10-CM

## 2015-01-08 LAB — PH, BODY FLUID: PH, BODY FLUID: 7.6

## 2015-01-08 LAB — BASIC METABOLIC PANEL
Anion gap: 11 (ref 5–15)
BUN: 23 mg/dL — ABNORMAL HIGH (ref 6–20)
CHLORIDE: 94 mmol/L — AB (ref 101–111)
CO2: 28 mmol/L (ref 22–32)
Calcium: 9.1 mg/dL (ref 8.9–10.3)
Creatinine, Ser: 1.23 mg/dL (ref 0.61–1.24)
GFR calc non Af Amer: 51 mL/min — ABNORMAL LOW (ref >60)
GFR, EST AFRICAN AMERICAN: 59 mL/min — AB (ref >60)
Glucose, Bld: 129 mg/dL — ABNORMAL HIGH (ref 65–99)
POTASSIUM: 4 mmol/L (ref 3.5–5.1)
SODIUM: 133 mmol/L — AB (ref 135–145)

## 2015-01-08 LAB — CBC
HEMATOCRIT: 29.5 % — AB (ref 39.0–52.0)
HEMOGLOBIN: 9.5 g/dL — AB (ref 13.0–17.0)
MCH: 30.1 pg (ref 26.0–34.0)
MCHC: 32.2 g/dL (ref 30.0–36.0)
MCV: 93.4 fL (ref 78.0–100.0)
PLATELETS: 218 10*3/uL (ref 150–400)
RBC: 3.16 MIL/uL — AB (ref 4.22–5.81)
RDW: 15.7 % — ABNORMAL HIGH (ref 11.5–15.5)
WBC: 9.2 10*3/uL (ref 4.0–10.5)

## 2015-01-08 LAB — MAGNESIUM: Magnesium: 2 mg/dL (ref 1.7–2.4)

## 2015-01-08 LAB — PHOSPHORUS: Phosphorus: 4.6 mg/dL (ref 2.5–4.6)

## 2015-01-08 MED ORDER — SACCHAROMYCES BOULARDII 250 MG PO CAPS
250.0000 mg | ORAL_CAPSULE | Freq: Two times a day (BID) | ORAL | Status: DC
  Administered 2015-01-08 – 2015-01-09 (×3): 250 mg via ORAL
  Filled 2015-01-08 (×3): qty 1

## 2015-01-08 MED ORDER — ASPIRIN 81 MG PO CHEW
81.0000 mg | CHEWABLE_TABLET | Freq: Every day | ORAL | Status: DC
  Administered 2015-01-08: 81 mg via ORAL
  Filled 2015-01-08 (×2): qty 1

## 2015-01-08 MED ORDER — AMOXICILLIN-POT CLAVULANATE 875-125 MG PO TABS
1.0000 | ORAL_TABLET | Freq: Two times a day (BID) | ORAL | Status: DC
  Administered 2015-01-08 – 2015-01-09 (×3): 1 via ORAL
  Filled 2015-01-08 (×3): qty 1

## 2015-01-08 MED ORDER — POLYETHYLENE GLYCOL 3350 17 G PO PACK
17.0000 g | PACK | Freq: Every day | ORAL | Status: DC
  Administered 2015-01-09: 17 g via ORAL
  Filled 2015-01-08: qty 1

## 2015-01-08 MED ORDER — BISACODYL 5 MG PO TBEC: 5.0000 mg | DELAYED_RELEASE_TABLET | Freq: Every day | ORAL | Status: DC | PRN

## 2015-01-08 NOTE — Progress Notes (Signed)
[facility] PROGRESS NOTE  [name] WEX:[id number] DOB: 04/22/1924 DOA: 01/06/2015 PCP: [name], MD  HPI/Brief narrative 81 year old male with history of PAF on Apixaban, recently diagnosed NSCLC s/p cryoablation of LLL at [facility] 01/02/15, left pleural effusion s/p thoracentesis 960 mL on 10/7 and resumed on Apixaban the following day, HTN, COPD, Loeffler syndrome, presented to [facility] ED on 01/06/15 with 2 days history of worsening dyspnea and on and off hemoptysis. In ED, CT chest showed left lower lobe nodule with focal gas collections and interval development of left pleural effusion, probably hemorrhagic. Pulmonology was consulted and patient underwent 700 mL of bloody pleural fluid by thoracentesis.  Assessment/Plan: Acute hypoxic respiratory failure - Multifactorial from recurrent left pleural effusion, COPD exacerbation and possible CHF - Pt is s/p Lasix 20 mg IV 1 on 10/11. 2-D echo with EF of 65-70% - Pulmonology was consulted and patient underwent left thoracentesis on 10/11 yielding 700 mL of bloody fluid. - Pt has reported feeling much improved - Pulmonary recs for continued Solumedrol through today and starting ASA tomorrow, then close outpt follow up thereafter.  Left pleural effusion-hemorrhagic, recurrent - Status post thoracentesis yielding 700 mL bloody fluid on 10/11  COPD exacerbation - Oxygen, bronchodilators and steroids added. Appreciate Pulmonary input  Possible acute CHF-presumed diastolic - Treated with a dose of IV Lasix 20 mg 1 on 10/11. Continued with PRN lasix. - 2-D echo with normal LV EF - Minimally elevated troponin of 0.06 likely from demand ischemia related to dyspnea. - Pt denies chest pain or sob  Recently diagnosed NSCLS  - Status post cryoablation of LLL at [facility] on 10/6 - Outpatient follow-up.  Paroxysmal A. Fib - Chads 2 vasc score is 3 if patient has CHF. Apixaban is on hold due to hemoptysis.  Anemia - Stable thus far  Asthma  & Loeffler syndrome - Management as above.  Concern for HCAP - As per pulmonology input, concern for healthcare associated pneumonia in light of recent multiple interventions/instrumentation. Pulm recs for augmentin  Hemoptysis in recently diagnosed NSCLS - Hold anticoagulation for now, plan to continue on ASA if stable tomorrow  Hyponatremia - ? SIADH. Cont to follow BMP a.m. after diuretics. Improved.  Code Status: Full Family Communication: Pt in room Disposition Plan: Pending   Consultants:  Pulmonary  Procedures:  Left Throacentesis 10/11  Antibiotics: Anti-infectives    Start     Dose/Rate Route Frequency Ordered Stop   01/08/15 1100  amoxicillin-clavulanate (AUGMENTIN) 875-125 MG per tablet 1 tablet     1 tablet Oral Every 12 hours 01/08/15 1048     01/07/15 1400  vancomycin (VANCOCIN) IVPB 750 mg/150 ml premix  Status:  Discontinued     750 mg 150 mL/hr over 60 Minutes Intravenous Every 12 hours 01/07/15 1257 01/08/15 1048   01/07/15 1400  piperacillin-tazobactam (ZOSYN) IVPB 3.375 g  Status:  Discontinued     3.375 g 12.5 mL/hr over 240 Minutes Intravenous Every 8 hours 01/07/15 1257 01/08/15 1048      HPI/Subjective: States feeling better today. Denies chest pain and sob  Objective: Filed Vitals:   01/07/15 2038 01/07/15 2054 01/08/15 0356 01/08/15 1324  BP: 121/58  106/57 109/55  Pulse: 84  73 70  Temp: 98.1 F (36.7 C)  97.8 F (36.6 C) 97.6 F (36.4 C)  TempSrc: Oral  Oral Oral  Resp: '18  18 18  '$ Height:      Weight:   71.532 kg (157 lb 11.2 oz)   SpO2: 96%  97% 96% 96%    Intake/Output Summary (Last 24 hours) at 01/08/15 1353 Last data filed at 01/08/15 1230  Gross per 24 hour  Intake    720 ml  Output   3320 ml  Net  -2600 ml   Filed Weights   01/06/15 2314 01/08/15 0356  Weight: 77.202 kg (170 lb 3.2 oz) 71.532 kg (157 lb 11.2 oz)    Exam:   General:  Awake, in nad  Cardiovascular: regular, s1, s2  Respiratory: normal resp  effort, no wheezing  Abdomen: soft,nondistended  Musculoskeletal: perfused, no clubbing   Data Reviewed: Basic Metabolic Panel:  Recent Labs Lab 01/06/15 2058 01/07/15 0215 01/08/15 0412  NA 125* 130* 133*  K 4.7 4.5 4.0  CL 92* 97* 94*  CO2 '24 26 28  '$ GLUCOSE 123* 126* 129*  BUN 21* 20 23*  CREATININE 0.89 0.91 1.23  CALCIUM 9.0 8.9 9.1  MG  --   --  2.0  PHOS  --   --  4.6   Liver Function Tests:  Recent Labs Lab 01/07/15 0215 01/07/15 1335  AST 52*  --   ALT 32  --   ALKPHOS 60  --   BILITOT 0.8  --   PROT 5.7* 6.0*  ALBUMIN 3.0*  --    No results for input(s): LIPASE, AMYLASE in the last 168 hours. No results for input(s): AMMONIA in the last 168 hours. CBC:  Recent Labs Lab 01/06/15 2058 01/07/15 0215 01/08/15 0412  WBC 9.4 9.7 9.2  NEUTROABS 8.1* 7.8*  --   HGB 8.9* 8.5* 9.5*  HCT 27.4* 25.8* 29.5*  MCV 92.6 92.5 93.4  PLT 191 167 218   Cardiac Enzymes:  Recent Labs Lab 01/07/15 0215  TROPONINI 0.06*   BNP (last 3 results)  Recent Labs  01/07/15 0215  BNP 559.0*    ProBNP (last 3 results) No results for input(s): PROBNP in the last 8760 hours.  CBG: No results for input(s): GLUCAP in the last 168 hours.  Recent Results (from the past 240 hour(s))  Body fluid culture     Status: None (Preliminary result)   Collection Time: 01/07/15  1:34 PM  Result Value Ref Range Status   Specimen Description FLUID PLEURAL LEFT  Final   Special Requests Normal  Final   Gram Stain   Final    ABUNDANT WBC PRESENT,BOTH PMN AND MONONUCLEAR NO ORGANISMS SEEN    Culture NO GROWTH < 24 HOURS  Final   Report Status PENDING  Incomplete     Studies: Dg Chest 2 View  01/06/2015  CLINICAL DATA:  Acute shortness of breath for 3 days. Patient with lung cancer. EXAM: CHEST  2 VIEW COMPARISON:  None. FINDINGS: Cardiomediastinal silhouette is unchanged. Increased opacity in the area of patient's known superior segment left lower lobe malignancy. This may  represent adjacent hemorrhage, post obstructive inflammation or infection. Left lower lung airspace disease/ atelectasis and new small left pleural effusion noted. A trace right pleural effusion is again noted. Emphysema/ COPD again noted. There is no evidence of pneumothorax. IMPRESSION: Increased opacity in the area the patient's known superior segment left lower lobe malignancy which may represent adjacent hemorrhage, postobstructive inflammation or infection. Left lower lung airspace disease/ atelectasis and new small left pleural effusion. Emphysema/COPD. Electronically Signed   By: Margarette Canada M.D.   On: 01/06/2015 18:42   Ct Chest W Contrast  01/07/2015  CLINICAL DATA:  Shortness of breath starting yesterday. Recent cryoablation for bronchogenic carcinoma on  Thursday with subsequent thoracentesis at [facility]. Worsening cough today. EXAM: CT CHEST WITH CONTRAST TECHNIQUE: Multidetector CT imaging of the chest was performed during intravenous contrast administration. CONTRAST:  166m OMNIPAQUE IOHEXOL 300 MG/ML  SOLN COMPARISON:  11/29/2014 FINDINGS: The pulmonary nodule previously identified in the superior segment of left lower lobe now appears larger and more diffuse with internal gas collections. This is likely result of interval cryoablation procedure. This can serve as a baseline for follow-up imaging to assess for response to therapy. Since the previous study, there is interval development of a small to moderate left pleural effusion with somewhat increased density suggesting hemorrhagic effusion. There is atelectasis in the left lower lung. Minimal right pleural effusion and basilar atelectasis. Bronchiectasis, greater on the right. Mild scarring in the right lung. No focal consolidation. No pneumothorax. Normal heart size. Normal caliber thoracic aorta. No aortic dissection. Scattered aortic calcification. Visualized central pulmonary arteries are well opacified without evidence of large  pulmonary embolus. Esophagus is decompressed. Mediastinal and hilar lymph nodes are not pathologically enlarged. Fatty tumor anterior to the tip of the scapula in the right lateral chest consistent lipoma. Included portions of the upper abdominal organs demonstrate calcified granulomas in the liver and spleen. Degenerative changes in the spine. Compression of the L1 vertebra without change since previous study. IMPRESSION: Nodule in the superior segment of left lower lobe has become more diffuse with focal gas collections. This is likely result of interval therapy. There is interval development of left pleural effusion, probably hemorrhagic, with atelectasis in the left lower lung. Small right pleural effusion. No pneumothorax. Chronic bronchiectasis mostly on the right with associated scarring. Electronically Signed   By: WLucienne CapersM.D.   On: 01/07/2015 01:14   Dg Chest Port 1 View  01/08/2015  CLINICAL DATA:  Followup respiratory failure EXAM: PORTABLE CHEST - 1 VIEW COMPARISON:  01/07/2015 FINDINGS: Cardiac shadow is stable. Persistent left perihilar density is noted. No new focal infiltrate is seen. Left-sided pleural effusion is again noted. No bony abnormality is seen. IMPRESSION: Stable left pleural effusion and left perihilar rounded density Electronically Signed   By: MInez CatalinaM.D.   On: 01/08/2015 08:10   Dg Chest Port 1 View  01/07/2015  CLINICAL DATA:  Left pleural effusion. EXAM: PORTABLE CHEST 1 VIEW COMPARISON:  CT chest January 07, 2015, chest x-ray January 06, 2015 FINDINGS: The mediastinal contour and cardiac silhouette are stable. There is left pleural effusion unchanged compared to prior exam. There is opacity in the superior segment of the left lower lobe unchanged compared to prior chest x-ray consistent with patient's known lung cancer. The visualized skeletal structures are stable. IMPRESSION: Left pleural effusion is unchanged. Electronically Signed   By: WAbelardo Diesel M.D.   On: 01/07/2015 14:40    Scheduled Meds: . amoxicillin-clavulanate  1 tablet Oral Q12H  . aspirin  81 mg Oral Daily  . celecoxib  200 mg Oral QHS  . finasteride  5 mg Oral Daily  . fluticasone  2 spray Each Nare Daily  . loratadine  10 mg Oral QHS  . methylPREDNISolone (SOLU-MEDROL) injection  40 mg Intravenous Q12H  . mometasone-formoterol  2 puff Inhalation BID  . montelukast  10 mg Oral QHS  . pantoprazole  40 mg Oral Daily  . rosuvastatin  5 mg Oral QHS  . saccharomyces boulardii  250 mg Oral BID  . sodium chloride  3 mL Intravenous Q12H  . temazepam  15 mg Oral QHS  .  tiotropium  18 mcg Inhalation Daily  . verapamil  180 mg Oral QHS   Continuous Infusions:   Principal Problem:   Acute respiratory failure with hypoxia (HCC) Active Problems:   Asthma   PAF (paroxysmal atrial fibrillation) (HCC)   Acute respiratory failure ([place])   [facility], [place] [facility] Pager [phone]. If 7PM-7AM, please contact night-coverage at [email or web address], password [email or web address] 01/08/2015, 1:53 PM  LOS: 2 days

## 2015-01-08 NOTE — Progress Notes (Signed)
Name: [name] MRN: [id number] DOB: [birth year]    ADMISSION DATE:  01/06/2015 CONSULTATION DATE:  01/07/15  REFERRING MD :  Algis Liming  CHIEF COMPLAINT:  Exertional SOB and hemoptysis  BRIEF PATIENT DESCRIPTION: 81 y.o. M with recently diagnosed NSCLC s/p cryoablation of LLL at [facility] 01/02/15, now presenting to [facility] with exertional SOB and hemoptysis.  PCCM consulted.   SIGNIFICANT EVENTS  12/11/14 - seen in clinic by Dr. Lake Bells for lung mass 12/18/14 - navigational bronchoscopy with biopsies by Dr. Lamonte Sakai > pathology noteable for NSCLC; probably adenocarcinoma. 01/02/15 - cryoablation of left lower lobe (at [facility]). 01/03/15 - left sided thoracentesis yielding 9100m serous fluid. [facility]. 01/07/15 - PCCM consulted.  Thora with 7048mbloody pleural fluid    STUDIES:  CT chest 10/10 >>> nodule in the superior segment of the LLL which has become more diffuse with focal gas collections, likely a result of recent intervention.  Interval development of a left pleural effusion, possibly hemorrhagic, along with atelectasis of the left lower lung.  Small right pleural effusion. Chronic bronchiectasis mostly on the right with associated scarring.  ECHO 10/11 >>  LV nml size, EF 65-70%, no RWMA, AV trivial regurgitation, MV mild regurgitation, LA mod dilation, RA mild dilation  SUBJECTIVE:  Net neg 3800 from diuresis. Pt reports feeling much better, breathing easier.  One episode of coughing up grey sputum.  No further hemoptysis.  ECHO results noted.    VITAL SIGNS: Temp:  [97.8 F (36.6 C)-98.1 F (36.7 C)] 97.8 F (36.6 C) (10/12 0356) Pulse Rate:  [73-84] 73 (10/12 0356) Resp:  [16-18] 18 (10/12 0356) BP: (106-121)/(56-58) 106/57 mmHg (10/12 0356) SpO2:  [96 %-98 %] 96 % (10/12 0356) Weight:  [157 lb 11.2 oz (71.532 kg)] 157 lb 11.2 oz (71.532 kg) (10/12 0356)  PHYSICAL EXAMINATION: General: Pleasant elderly male, resting in bed, in  NAD. Neuro: A&O x 3, non-focal.  HEENT: [place]/AT. PERRL, sclerae anicteric. Cardiovascular: IRIR, soft SEM   Lungs: Respirations even and unlabored on RA.  Faint basilar crackles posterior L>R Abdomen: BS x 4, soft, NT/ND.  Musculoskeletal: No gross deformities, no edema.  Skin: Intact, warm, no rashes.     Recent Labs Lab 01/06/15 2058 01/07/15 0215 01/08/15 0412  NA 125* 130* 133*  K 4.7 4.5 4.0  CL 92* 97* 94*  CO2 '24 26 28  '$ BUN 21* 20 23*  CREATININE 0.89 0.91 1.23  GLUCOSE 123* 126* 129*    Recent Labs Lab 01/06/15 2058 01/07/15 0215 01/08/15 0412  HGB 8.9* 8.5* 9.5*  HCT 27.4* 25.8* 29.5*  WBC 9.4 9.7 9.2  PLT 191 167 218   Dg Chest 2 View  01/06/2015  CLINICAL DATA:  Acute shortness of breath for 3 days. Patient with lung cancer. EXAM: CHEST  2 VIEW COMPARISON:  None. FINDINGS: Cardiomediastinal silhouette is unchanged. Increased opacity in the area of patient's known superior segment left lower lobe malignancy. This may represent adjacent hemorrhage, post obstructive inflammation or infection. Left lower lung airspace disease/ atelectasis and new small left pleural effusion noted. A trace right pleural effusion is again noted. Emphysema/ COPD again noted. There is no evidence of pneumothorax. IMPRESSION: Increased opacity in the area the patient's known superior segment left lower lobe malignancy which may represent adjacent hemorrhage, postobstructive inflammation or infection. Left lower lung airspace disease/ atelectasis and new small left pleural effusion. Emphysema/COPD. Electronically Signed   By: JeMargarette Canada.D.   On: 01/06/2015  18:42   Ct Chest W Contrast  01/07/2015  CLINICAL DATA:  Shortness of breath starting yesterday. Recent cryoablation for bronchogenic carcinoma on Thursday with subsequent thoracentesis at [facility]. Worsening cough today. EXAM: CT CHEST WITH CONTRAST TECHNIQUE: Multidetector CT imaging of the chest was performed during intravenous  contrast administration. CONTRAST:  170m OMNIPAQUE IOHEXOL 300 MG/ML  SOLN COMPARISON:  11/29/2014 FINDINGS: The pulmonary nodule previously identified in the superior segment of left lower lobe now appears larger and more diffuse with internal gas collections. This is likely result of interval cryoablation procedure. This can serve as a baseline for follow-up imaging to assess for response to therapy. Since the previous study, there is interval development of a small to moderate left pleural effusion with somewhat increased density suggesting hemorrhagic effusion. There is atelectasis in the left lower lung. Minimal right pleural effusion and basilar atelectasis. Bronchiectasis, greater on the right. Mild scarring in the right lung. No focal consolidation. No pneumothorax. Normal heart size. Normal caliber thoracic aorta. No aortic dissection. Scattered aortic calcification. Visualized central pulmonary arteries are well opacified without evidence of large pulmonary embolus. Esophagus is decompressed. Mediastinal and hilar lymph nodes are not pathologically enlarged. Fatty tumor anterior to the tip of the scapula in the right lateral chest consistent lipoma. Included portions of the upper abdominal organs demonstrate calcified granulomas in the liver and spleen. Degenerative changes in the spine. Compression of the L1 vertebra without change since previous study. IMPRESSION: Nodule in the superior segment of left lower lobe has become more diffuse with focal gas collections. This is likely result of interval therapy. There is interval development of left pleural effusion, probably hemorrhagic, with atelectasis in the left lower lung. Small right pleural effusion. No pneumothorax. Chronic bronchiectasis mostly on the right with associated scarring. Electronically Signed   By: WLucienne CapersM.D.   On: 01/07/2015 01:14   Dg Chest Port 1 View  01/08/2015  CLINICAL DATA:  Followup respiratory failure EXAM:  PORTABLE CHEST - 1 VIEW COMPARISON:  01/07/2015 FINDINGS: Cardiac shadow is stable. Persistent left perihilar density is noted. No new focal infiltrate is seen. Left-sided pleural effusion is again noted. No bony abnormality is seen. IMPRESSION: Stable left pleural effusion and left perihilar rounded density Electronically Signed   By: MInez CatalinaM.D.   On: 01/08/2015 08:10   Dg Chest Port 1 View  01/07/2015  CLINICAL DATA:  Left pleural effusion. EXAM: PORTABLE CHEST 1 VIEW COMPARISON:  CT chest January 07, 2015, chest x-ray January 06, 2015 FINDINGS: The mediastinal contour and cardiac silhouette are stable. There is left pleural effusion unchanged compared to prior exam. There is opacity in the superior segment of the left lower lobe unchanged compared to prior chest x-ray consistent with patient's known lung cancer. The visualized skeletal structures are stable. IMPRESSION: Left pleural effusion is unchanged. Electronically Signed   By: WAbelardo DieselM.D.   On: 01/07/2015 14:40   Ultrasound of Left Pleural Space 01/07/15:          ASSESSMENT / PLAN:  Dyspnea. Hx asthma and Loeffler's syndrome. Plan: Hold further diuresis 10/12 with rise in sr cr (0.9 to 1.23) Monitor I/O's. PRN BD's. Continue dulera Steroids 40 BID for bronchospasm >> consider d/c 10/13  Intermittent CXR  Exudative Pleural Effusion / Hemothorax - s/p left thoracetesis 01/03/15; now recurred (see images above).  Repeat thora 10/11 with bloody fluid returned.  Plan: Follow pleural studies:  LDH 766, protein <3, WBC 3953, Lymph 18, Eos 8,  neut 45, HCT 22.3 (serum Hct 25) AFB >> Culture >> Fungal >>   Concern for HCAP / infectious process in light of recent multiple interventions / instrumentation. Plan: Vanc / Zosyn, start 10/11, D2/x. Trend PCT > transition to Augmentin for 5 days total  Monitor clinically. CXR in AM. Florastor Incentive spirometry / mobilize / PT consult   Hemoptysis - in setting of  recently diagnosed NSCLC s/p cryoablation and chronic anticoagulation for PAF. Plan: Continue to hold apixaban. Monitor for further episodes of hemoptysis. Restart of ASA  Non-small cell lung cancer - s/p cryoablation of LLL 01/02/15 at [facility]. Plan: Outpatient follow up.   Noe Gens, NP-C [facility] Pgr: [phone] or if no answer [phone] 01/08/2015, 9:46 AM

## 2015-01-08 NOTE — Evaluation (Signed)
Physical Therapy Evaluation Patient Details Name: [name] MRN: [id number] DOB: December 12, 1924 Today's Date: 01/08/2015   History of Present Illness  pt is an 81 y.o. M with recently diagnosed NSCLC s/p cryoablation of LLL at [facility] 01/02/15, PMHx of afib, COPD and arthritis, now presenting to [facility] with exertional SOB and hemoptysis.  Clinical Impression  .Pt admitted with/for SOB and hemoptysis which appear to be more controlled at this time.  Pt currently limited functionally due to the problems listed below.  (see problems list.)  Pt will benefit from PT to maximize function and safety to be able to get home safely with available assist from family.     Follow Up Recommendations Home health PT;Other (comment) (up to 24 hour assist initially)    Equipment Recommendations       Recommendations for Other Services       Precautions / Restrictions Precautions Precautions: Fall Restrictions Weight Bearing Restrictions: No      Mobility  Bed Mobility Overal bed mobility: Needs Assistance Bed Mobility: Supine to Sit;Sit to Supine     Supine to sit: Supervision Sit to supine: Supervision      Transfers Overall transfer level: Needs assistance Equipment used: Rolling walker (2 wheeled) Transfers: Sit to/from Stand Sit to Stand: Supervision         General transfer comment: cued for safe hand placement  Ambulation/Gait Ambulation/Gait assistance: Supervision Ambulation Distance (Feet): 350 Feet Assistive device: Rolling walker (2 wheeled) Gait Pattern/deviations: Step-through pattern Gait velocity: slower Gait velocity interpretation: at or above normal speed for age/gender General Gait Details: mild unsteadiness held in check by safe use of the RW.  Weakness L>R hip with R mild hip drop.  Stairs            Wheelchair Mobility    Modified Rankin (Stroke Patients Only)       Balance Overall balance assessment: Needs assistance Sitting-balance support: No  upper extremity supported Sitting balance-Leahy Scale: Good     Standing balance support: No upper extremity supported Standing balance-Leahy Scale: Fair                               Pertinent Vitals/Pain Pain Assessment: No/denies pain    Home Living Family/patient expects to be discharged to:: Private residence Living Arrangements: Alone Available Help at Discharge: Available 24 hours/day (family and outside help to work toward 24/7 help) Type of Home: House Home Access: Stairs to enter Entrance Stairs-Rails: Psychiatric nurse of Steps: 2 Home Layout: One level;Two level;Able to live on main level with bedroom/bathroom Home Equipment: Walker - 2 wheels      Prior Function Level of Independence: Independent with assistive device(s)               Hand Dominance        Extremity/Trunk Assessment   Upper Extremity Assessment: Overall WFL for tasks assessed           Lower Extremity Assessment: Overall WFL for tasks assessed (with proximal/quad weaknesses and lower trunk weakness)         Communication   Communication: No difficulties  Cognition   Behavior During Therapy: WFL for tasks assessed/performed Overall Cognitive Status: Within Functional Limits for tasks assessed                      General Comments General comments (skin integrity, edema, etc.): SpO2 on RA 94%, EHR 100 bpm, mild  dyspnea.    Exercises        Assessment/Plan    PT Assessment Patient needs continued PT services  PT Diagnosis Generalized weakness   PT Problem List Decreased strength;Decreased activity tolerance;Decreased balance;Decreased knowledge of use of DME;Cardiopulmonary status limiting activity  PT Treatment Interventions DME instruction;Gait training;Stair training;Functional mobility training;Therapeutic activities;Patient/family education   PT Goals (Current goals can be found in the Care Plan section) Acute Rehab PT  Goals Patient Stated Goal: Independence PT Goal Formulation: With patient Time For Goal Achievement: 01/15/15 Potential to Achieve Goals: Good    Frequency Min 3X/week   Barriers to discharge        Co-evaluation               End of Session   Activity Tolerance: Patient tolerated treatment well Patient left: in bed;with call bell/phone within reach Nurse Communication: Mobility status         Time: 0177-9390 PT Time Calculation (min) (ACUTE ONLY): 29 min   Charges:   PT Evaluation $Initial PT Evaluation Tier I: 1 Procedure PT Treatments $Gait Training: 8-22 mins   PT G Codes:        Dempsey Knotek, Tessie Fass 01/08/2015, 11:02 AM 01/08/2015  Donnella Sham, PT [phone]  (pager)

## 2015-01-09 ENCOUNTER — Telehealth: Payer: Self-pay | Admitting: Pulmonary Disease

## 2015-01-09 ENCOUNTER — Inpatient Hospital Stay (HOSPITAL_COMMUNITY): Payer: Medicare Other

## 2015-01-09 LAB — BASIC METABOLIC PANEL
ANION GAP: 7 (ref 5–15)
BUN: 29 mg/dL — AB (ref 6–20)
CALCIUM: 8.9 mg/dL (ref 8.9–10.3)
CO2: 30 mmol/L (ref 22–32)
Chloride: 95 mmol/L — ABNORMAL LOW (ref 101–111)
Creatinine, Ser: 1.12 mg/dL (ref 0.61–1.24)
GFR calc Af Amer: >60 mL/min (ref >60)
GFR, EST NON AFRICAN AMERICAN: 57 mL/min — AB (ref >60)
Glucose, Bld: 130 mg/dL — ABNORMAL HIGH (ref 65–99)
POTASSIUM: 4.3 mmol/L (ref 3.5–5.1)
SODIUM: 132 mmol/L — AB (ref 135–145)

## 2015-01-09 MED ORDER — ASPIRIN 81 MG PO CHEW: 81.0000 mg | CHEWABLE_TABLET | Freq: Every day | ORAL | Status: DC

## 2015-01-09 MED ORDER — AMOXICILLIN-POT CLAVULANATE 875-125 MG PO TABS: 1.0000 | ORAL_TABLET | Freq: Two times a day (BID) | ORAL | Status: DC

## 2015-01-09 NOTE — Care Management Note (Signed)
Case Management Note Marvetta Gibbons RN, BSN Unit 2W-Case Manager [phone]  Patient Details  Name: [name] MRN: [id number] Date of Birth: 1924-03-31  Subjective/Objective:     Pt admitted with acute resp.Failure, exertional SOB and hemoptysis, s/p thoracentesis for pl. effusion           Action/Plan: PTA pt lived at home- independent- has DME that includes RW and transfer w/c- no other DME needs identified in conversation with pt and daughter- per PT recommendations -HH-PT recommended and pt does want [facility] services- states that he has used [place] in the past- list for [facility] in [facility] provided for review- per pt choice he would still like to use [place] for [facility] services- Will ask MD for HH-PT order- referral called to [facility] with Arville Go- awaiting HH-PT order with [facility].   Expected Discharge Date:  01/09/15               Expected Discharge Plan:  [name]  In-House Referral:     Discharge planning Services  CM Consult  Post Acute Care Choice:  Home Health Choice offered to:  Patient, Adult Children  DME Arranged:  N/A DME Agency:  NA  HH Arranged:  PT HH Agency:  [facility]  Status of Service:  Completed, signed off  Medicare Important Message Given:  Yes-second notification given Date Medicare IM Given:    Medicare IM give by:    Date Additional Medicare IM Given:    Additional Medicare Important Message give by:     If discussed at [place] of Stay Meetings, dates discussed:    Additional Comments:  Dawayne Patricia, RN 01/09/2015, 11:54 AM

## 2015-01-09 NOTE — Telephone Encounter (Signed)
Per BQ, pt is needing a HFU with TP or BQ in 2-3 weeks from 01/09/2015. Pt will need CXR on the same day as the visit before seeing the provider.   LMTCB for pt.

## 2015-01-09 NOTE — Progress Notes (Signed)
Primary Care Courtesy Visit- History and record reviewed with patient who was admitted for recurrent hemothorax post-procedure (cryoablation) at [facility] on 01/02/15.  Had 900 cc removed initially on Friday and additional 700cc on Monday.  He is at high risk for rebleeding.  I recommend that we hold Aspirin and Eliquis for 2 weeks (acknowledging increased risk for thromboembolic disease with malignancy) but do not feel that limited data regarding stroke prevention with ASA supports risk of recurrent bleeding.  I will see [name] back within the next week for [facility] visit and will discuss further after his follow-up visit at [facility] on Tuesday.  [facility] consult as outpatient to discuss other treatment options.  Greatly appreciate Hospitalist and Pulmonology care.

## 2015-01-09 NOTE — Care Management Important Message (Signed)
Important Message  Patient Details  Name: [name] MRN: [id number] Date of Birth: 1924/05/18   Medicare Important Message Given:  Yes-second notification given    Nathen May 01/09/2015, 10:42 AM

## 2015-01-09 NOTE — Discharge Summary (Signed)
Physician Discharge Summary  [name] KYH:[id number] DOB: June 18, 1924 DOA: 01/06/2015  PCP: [name], MD  Admit date: 01/06/2015 Discharge date: 01/09/2015  Time spent: 40 minutes  Recommendations for Outpatient Follow-up:  1. antiplatelet agents/anticoagulation to be restarted as per discussion with primary versus subspecialist at [facility]  2. patient will need home health follow-up 3. Recommend Chem-12 CBC in about one week 4. Continue other meds without specific change  Discharge Diagnoses:  Principal Problem:   Acute respiratory failure with hypoxia (HCC) Active Problems:   Asthma   PAF (paroxysmal atrial fibrillation) (HCC)   Acute respiratory failure [facility])   Discharge Condition: fair   Diet recommendation: hh   Filed Weights   01/06/15 2314 01/08/15 0356 01/09/15 0403  Weight: 77.202 kg (170 lb 3.2 oz) 71.532 kg (157 lb 11.2 oz) 71.714 kg (158 lb 1.6 oz)    History of present illness:  38 ?  PAF on Apixaban, recently diagnosed NSCLC s/p cryoablation of LLL at [facility] 01/02/15, left pleural effusion s/p thoracentesis 960 mL on 10/7 and resumed on Apixaban the following day  HTN,  COPD, Loeffler syndrome,  presented to [facility] ED on 01/06/15 with 2 days history of worsening dyspnea and on and off hemoptysis.   In ED, CT chest showed left lower lobe nodule with focal gas collections and interval development of left pleural effusion, probably hemorrhagic. Pulmonology was consulted and patient underwent 700 mL of bloody pleural fluid by thoracentesis.  Hospital Course:  hypoxic respiratory failure - Multifactorial from recurrent left pleural effusion, COPD exacerbation and possible CHF - Pt is s/p Lasix 20 mg IV 1 on 10/11. 2-D echo with EF of 65-70%  - Pulmonology was consulted and patient underwent left thoracentesis on 10/11 yielding 700 mL of bloody fluid. - Pt has reported feeling much improved - Pulmonary recs- Solumedroland other steroids d/c on d/c -patient  wishes to hold off starting ASA tomorrow--needs close outpt follow up thereafter.  Left pleural effusion-hemorrhagic, recurrent - Status post thoracentesis yielding 700 mL bloody fluid on 10/11  COPD exacerbation - Oxygen, bronchodilators and steroids added.  Appreciate Pulmonary input  Possible acute CHF-presumed diastolic - Treated with a dose of IV Lasix 20 mg 1 on 10/11. Continued with PRN lasix. - 2-D echo with normal LV EF - Minimally elevated troponin of 0.06 likely from demand ischemia related to dyspnea. - Pt denies chest pain or sob  Recently diagnosed NSCLS  - Status post cryoablation of LLL at [facility] on 10/6 - Outpatient follow-up for Lung [facility]  Paroxysmal A. Fib - Chads 2 vasc score is 3 if patient has CHF. Apixaban is on hold due to hemoptysis.  Anemia - Stable thus far  Asthma & Loeffler syndrome - Management as above.  Concern for HCAP - As per pulmonology input, concern for healthcare associated pneumonia in light of recent multiple interventions/instrumentation. Pulm recs for augmentin -complete course of Augmentin 01/14/15  Hemoptysis in recently diagnosed NSCLS - Hold anticoagulation for now, plan to continue on ASA if stable tomorrow  Procedures:  Thoracentesis as above  Consultations:  Pulmonary Dr. Pennie Banter  Discharge Exam: Filed Vitals:   01/09/15 0403  BP: 122/62  Pulse: 71  Temp: 97.8 F (36.6 C)  Resp: 18    General: alert pleasant  And in no distress Cardiovascular:  s1 s 2no m/r/g Respiratory: clear no added sound.  No rales no rhonchi.  No tvr/no tvf  Discharge Instructions   Discharge Instructions    Diet - low sodium heart  healthy    Complete by:  As directed      Discharge instructions    Complete by:  As directed   Stop apixaban until you see lung MD again or primary care physician.  You probably will need a repeat cxr prior to re-starting on this You may take aspirin 81 mg daily when it is determined by you and your  physicians to be safe to do so You will need 5 days of augmentin orally which we have called in for you to your pharmacy Please get labs done within the next 5-7 days as an OP     Increase activity slowly    Complete by:  As directed           Current Discharge Medication List    START taking these medications   Details  amoxicillin-clavulanate (AUGMENTIN) 875-125 MG tablet Take 1 tablet by mouth every 12 (twelve) hours. Qty: 10 tablet, Refills: 0    aspirin 81 MG chewable tablet Chew 1 tablet (81 mg total) by mouth daily.      CONTINUE these medications which have NOT CHANGED   Details  albuterol (PROVENTIL HFA;VENTOLIN HFA) 108 (90 BASE) MCG/ACT inhaler Inhale 1-2 puffs into the lungs every 6 (six) hours as needed for wheezing or shortness of breath.    Cetirizine HCl (ZYRTEC ALLERGY PO) Take 10 mg by mouth at bedtime.     CRESTOR 10 MG tablet Take 5 mg by mouth at bedtime.     finasteride (PROSCAR) 5 MG tablet Take 5 mg by mouth daily.      fluticasone (FLONASE) 50 MCG/ACT nasal spray Place 2 sprays into the nose daily.      Fluticasone-Salmeterol (ADVAIR DISKUS) 250-50 MCG/DOSE AEPB Inhale 1 puff into the lungs 2 (two) times daily.      furosemide (LASIX) 20 MG tablet Take 20 mg by mouth daily as needed for fluid.     Guaifenesin 1200 MG TB12 Take 1,200 mg by mouth 2 (two) times daily.    methylPREDNISolone (MEDROL DOSEPAK) 4 MG TBPK tablet Take by mouth as directed.     montelukast (SINGULAIR) 10 MG tablet Take 10 mg by mouth at bedtime.      omeprazole (PRILOSEC) 20 MG capsule Take 20 mg by mouth every evening.     oxyCODONE-acetaminophen (PERCOCET/ROXICET) 5-325 MG tablet Take 1 tablet by mouth every 6 (six) hours as needed for moderate pain or severe pain.     temazepam (RESTORIL) 15 MG capsule Take 15 mg by mouth at bedtime.     Tiotropium Bromide Monohydrate (SPIRIVA RESPIMAT) 2.5 MCG/ACT AERS Inhale 2 puffs into the lungs daily.    verapamil (COVERA HS) 180  MG (CO) 24 hr tablet Take 1 tablet (180 mg total) by mouth at bedtime. Qty: 30 tablet, Refills: 5    XOPENEX HFA 45 MCG/ACT inhaler 1-2 puffs every 6 (six) hours as needed for shortness of breath.     zolpidem (AMBIEN) 10 MG tablet Take 10 mg by mouth at bedtime as needed for sleep.       STOP taking these medications     apixaban (ELIQUIS) 5 MG TABS tablet      celecoxib (CELEBREX) 200 MG capsule        Allergies  Allergen Reactions  . Sulfonamide Derivatives Other (See Comments)    Unk   Follow-up Information    Follow up with [facility].   Why:  HH-PT   Contact information:   [address]  [place] [address] [phone]        The results of significant diagnostics from this hospitalization (including imaging, microbiology, ancillary and laboratory) are listed below for reference.    Significant Diagnostic Studies: Dg Chest 2 View  01/09/2015  EXAM: CHEST  2 VIEW history of effusions, followup COMPARISON:  Portable chest x-ray of 01/08/2015 and CT chest of 01/07/2015 FINDINGS: The opacity adjacent to the left hilum is again noted and by CT chest appears to represent a superior segment left lower lobe lesion. A small left pleural effusion remains. The right lung appears well aerated. Heart size is stable. IMPRESSION: No change in left lower lobe lesion with small left pleural effusion. Electronically Signed   By: Ivar Drape M.D.   On: 01/09/2015 08:01   Dg Chest 2 View  01/06/2015  CLINICAL DATA:  Acute shortness of breath for 3 days. Patient with lung cancer. EXAM: CHEST  2 VIEW COMPARISON:  None. FINDINGS: Cardiomediastinal silhouette is unchanged. Increased opacity in the area of patient's known superior segment left lower lobe malignancy. This may represent adjacent hemorrhage, post obstructive inflammation or infection. Left lower lung airspace disease/ atelectasis and new small left pleural effusion noted. A trace right pleural effusion is  again noted. Emphysema/ COPD again noted. There is no evidence of pneumothorax. IMPRESSION: Increased opacity in the area the patient's known superior segment left lower lobe malignancy which may represent adjacent hemorrhage, postobstructive inflammation or infection. Left lower lung airspace disease/ atelectasis and new small left pleural effusion. Emphysema/COPD. Electronically Signed   By: Margarette Canada M.D.   On: 01/06/2015 18:42   Dg Chest 2 View  12/18/2014  CLINICAL DATA:  Preop bronchoscopy.  Atrial fibrillation. EXAM: CHEST  2 VIEW COMPARISON:  PET-CT 12/09/2014 FINDINGS: The lungs are hyperinflated likely secondary to COPD. There is a 3.2 cm pulmonary mass in the superior segment of the left lower lobe. There is bilateral mild chronic interstitial thickening. There is chronic interstitial lung disease most prominent in the right upper lobe. There is no pleural effusion or pneumothorax. The heart and mediastinal contours are unremarkable. The osseous structures are unremarkable. IMPRESSION: 1. Stable pulmonary mass in the superior segment of the left lower lobe. Electronically Signed   By: Kathreen Devoid   On: 12/18/2014 08:48   Ct Chest W Contrast  01/07/2015  CLINICAL DATA:  Shortness of breath starting yesterday. Recent cryoablation for bronchogenic carcinoma on Thursday with subsequent thoracentesis at [facility]. Worsening cough today. EXAM: CT CHEST WITH CONTRAST TECHNIQUE: Multidetector CT imaging of the chest was performed during intravenous contrast administration. CONTRAST:  144m OMNIPAQUE IOHEXOL 300 MG/ML  SOLN COMPARISON:  11/29/2014 FINDINGS: The pulmonary nodule previously identified in the superior segment of left lower lobe now appears larger and more diffuse with internal gas collections. This is likely result of interval cryoablation procedure. This can serve as a baseline for follow-up imaging to assess for response to therapy. Since the previous study, there is interval  development of a small to moderate left pleural effusion with somewhat increased density suggesting hemorrhagic effusion. There is atelectasis in the left lower lung. Minimal right pleural effusion and basilar atelectasis. Bronchiectasis, greater on the right. Mild scarring in the right lung. No focal consolidation. No pneumothorax. Normal heart size. Normal caliber thoracic aorta. No aortic dissection. Scattered aortic calcification. Visualized central pulmonary arteries are well opacified without evidence of large pulmonary embolus. Esophagus is decompressed. Mediastinal and hilar lymph nodes are not pathologically enlarged. Fatty tumor  anterior to the tip of the scapula in the right lateral chest consistent lipoma. Included portions of the upper abdominal organs demonstrate calcified granulomas in the liver and spleen. Degenerative changes in the spine. Compression of the L1 vertebra without change since previous study. IMPRESSION: Nodule in the superior segment of left lower lobe has become more diffuse with focal gas collections. This is likely result of interval therapy. There is interval development of left pleural effusion, probably hemorrhagic, with atelectasis in the left lower lung. Small right pleural effusion. No pneumothorax. Chronic bronchiectasis mostly on the right with associated scarring. Electronically Signed   By: Lucienne Capers M.D.   On: 01/07/2015 01:14   Dg Chest Port 1 View  01/08/2015  CLINICAL DATA:  Followup respiratory failure EXAM: PORTABLE CHEST - 1 VIEW COMPARISON:  01/07/2015 FINDINGS: Cardiac shadow is stable. Persistent left perihilar density is noted. No new focal infiltrate is seen. Left-sided pleural effusion is again noted. No bony abnormality is seen. IMPRESSION: Stable left pleural effusion and left perihilar rounded density Electronically Signed   By: Inez Catalina M.D.   On: 01/08/2015 08:10   Dg Chest Port 1 View  01/07/2015  CLINICAL DATA:  Left pleural  effusion. EXAM: PORTABLE CHEST 1 VIEW COMPARISON:  CT chest January 07, 2015, chest x-ray January 06, 2015 FINDINGS: The mediastinal contour and cardiac silhouette are stable. There is left pleural effusion unchanged compared to prior exam. There is opacity in the superior segment of the left lower lobe unchanged compared to prior chest x-ray consistent with patient's known lung cancer. The visualized skeletal structures are stable. IMPRESSION: Left pleural effusion is unchanged. Electronically Signed   By: Abelardo Diesel M.D.   On: 01/07/2015 14:40   Dg Chest Port 1 View  12/18/2014  CLINICAL DATA:  Postop bronchoscopy. EXAM: PORTABLE CHEST - 1 VIEW COMPARISON:  Radiographs 12/18/2014.  CT 12/09/2014. FINDINGS: 1216 hours. The heart size and mediastinal contours are stable. The left lower lobe mass overlapping the left hilum is less well defined, possibly related to post biopsy hemorrhage. No other significant changes are identified. There is stable chronic lung disease with perihilar scarring. No evidence of pneumothorax or significant pleural effusion. IMPRESSION: Possible mild hemorrhage surrounding the previously demonstrated left lower lobe mass. No other significant findings. No evidence of pneumothorax. Electronically Signed   By: Richardean Sale M.D.   On: 12/18/2014 12:38   Dg C-arm Bronchoscopy  12/18/2014  CLINICAL DATA:  C-ARM BRONCHOSCOPY Fluoroscopy was utilized by the requesting physician.  No radiographic interpretation.    Microbiology: Recent Results (from the past 240 hour(s))  Body fluid culture     Status: None (Preliminary result)   Collection Time: 01/07/15  1:34 PM  Result Value Ref Range Status   Specimen Description FLUID PLEURAL LEFT  Final   Special Requests Normal  Final   Gram Stain   Final    ABUNDANT WBC PRESENT,BOTH PMN AND MONONUCLEAR NO ORGANISMS SEEN    Culture NO GROWTH 2 DAYS  Final   Report Status PENDING  Incomplete  AFB culture with smear     Status:  None (Preliminary result)   Collection Time: 01/07/15  1:34 PM  Result Value Ref Range Status   Specimen Description FLUID PLEURAL LEFT  Final   Special Requests Normal  Final   Acid Fast Smear   Final    NO ACID FAST BACILLI SEEN Performed at [facility]    Culture   Final    CULTURE WILL  BE EXAMINED FOR 6 WEEKS BEFORE ISSUING A FINAL REPORT Performed at [facility]    Report Status PENDING  Incomplete  Fungus Culture with Smear     Status: None (Preliminary result)   Collection Time: 01/07/15  1:34 PM  Result Value Ref Range Status   Specimen Description FLUID LEFT PLEURAL  Final   Special Requests NONE  Final   Fungal Smear   Final    NO YEAST OR FUNGAL ELEMENTS SEEN Performed at [facility]    Culture   Final    CULTURE IN PROGRESS FOR FOUR WEEKS Performed at [facility]    Report Status PENDING  Incomplete     Labs: Basic Metabolic Panel:  Recent Labs Lab 01/06/15 2058 01/07/15 0215 01/08/15 0412 01/09/15 0555  NA 125* 130* 133* 132*  K 4.7 4.5 4.0 4.3  CL 92* 97* 94* 95*  CO2 '24 26 28 30  '$ GLUCOSE 123* 126* 129* 130*  BUN 21* 20 23* 29*  CREATININE 0.89 0.91 1.23 1.12  CALCIUM 9.0 8.9 9.1 8.9  MG  --   --  2.0  --   PHOS  --   --  4.6  --    Liver Function Tests:  Recent Labs Lab 01/07/15 0215 01/07/15 1335  AST 52*  --   ALT 32  --   ALKPHOS 60  --   BILITOT 0.8  --   PROT 5.7* 6.0*  ALBUMIN 3.0*  --    No results for input(s): LIPASE, AMYLASE in the last 168 hours. No results for input(s): AMMONIA in the last 168 hours. CBC:  Recent Labs Lab 01/06/15 2058 01/07/15 0215 01/08/15 0412  WBC 9.4 9.7 9.2  NEUTROABS 8.1* 7.8*  --   HGB 8.9* 8.5* 9.5*  HCT 27.4* 25.8* 29.5*  MCV 92.6 92.5 93.4  PLT 191 167 218   Cardiac Enzymes:  Recent Labs Lab 01/07/15 0215  TROPONINI 0.06*   BNP: BNP (last 3 results)  Recent Labs  01/07/15 0215  BNP 559.0*    ProBNP (last 3 results) No results for  input(s): PROBNP in the last 8760 hours.  CBG: No results for input(s): GLUCAP in the last 168 hours.     SignedNita Sells  [facility] 01/09/2015, 12:23 PM

## 2015-01-09 NOTE — Telephone Encounter (Signed)
Pt returned call. Appt made with TP on 10.27.2016 with CXR. Pt had appt with BQ on 10.20.16 as ROV (this has been cancelled). Pt verbalized understanding and denied any further questions or concerns at this time.

## 2015-01-09 NOTE — Progress Notes (Signed)
Name: [name] MRN: [id number] DOB: 06-23-1924    ADMISSION DATE:  01/06/2015 CONSULTATION DATE:  01/07/15  REFERRING MD :  Algis Liming  CHIEF COMPLAINT:  Exertional SOB and hemoptysis  BRIEF PATIENT DESCRIPTION: 81 y.o. M with recently diagnosed NSCLC s/p cryoablation of LLL at [facility] 01/02/15, now presenting to [facility] with exertional SOB and hemoptysis.  PCCM consulted.   SIGNIFICANT EVENTS  12/11/14 - seen in clinic by Dr. Lake Bells for lung mass 12/18/14 - navigational bronchoscopy with biopsies by Dr. Lamonte Sakai > pathology noteable for NSCLC; probably adenocarcinoma. 01/02/15 - cryoablation of left lower lobe (at [facility]). 01/03/15 - left sided thoracentesis yielding 954m serous fluid. [facility]. 01/07/15 - PCCM consulted.  Thora with 7028mbloody pleural fluid cytology negative  STUDIES:  CT chest 10/10 >>> nodule in the superior segment of the LLL which has become more diffuse with focal gas collections, likely a result of recent intervention.  Interval development of a left pleural effusion, possibly hemorrhagic, along with atelectasis of the left lower lung.  Small right pleural effusion. Chronic bronchiectasis mostly on the right with associated scarring.  ECHO 10/11 >>  LV nml size, EF 65-70%, no RWMA, AV trivial regurgitation, MV mild regurgitation, LA mod dilation, RA mild dilation  SUBJECTIVE:  Feeling great today, wants to go home   VITAL SIGNS: Temp:  [97.6 F (36.4 C)-98.3 F (36.8 C)] 97.8 F (36.6 C) (10/13 0403) Pulse Rate:  [70-83] 71 (10/13 0403) Resp:  [18] 18 (10/13 0403) BP: (107-122)/(55-62) 122/62 mmHg (10/13 0403) SpO2:  [96 %-97 %] 97 % (10/13 0403) Weight:  [158 lb 1.6 oz (71.714 kg)] 158 lb 1.6 oz (71.714 kg) (10/13 0403)  PHYSICAL EXAMINATION: General: no distress HENT: NCAT, OP clear PULM: few crackles R base, otherwise clear, normal respiratory effort CV: irreg irreg, no mgr, skin well perfused GI: BS+,  soft, nontender MSK: normal bulk and tone Neuro: A&Ox4, maew     Recent Labs Lab 01/07/15 0215 01/08/15 0412 01/09/15 0555  NA 130* 133* 132*  K 4.5 4.0 4.3  CL 97* 94* 95*  CO2 '26 28 30  '$ BUN 20 23* 29*  CREATININE 0.91 1.23 1.12  GLUCOSE 126* 129* 130*    Recent Labs Lab 01/06/15 2058 01/07/15 0215 01/08/15 0412  HGB 8.9* 8.5* 9.5*  HCT 27.4* 25.8* 29.5*  WBC 9.4 9.7 9.2  PLT 191 167 218   Dg Chest 2 View  01/09/2015  EXAM: CHEST  2 VIEW history of effusions, followup COMPARISON:  Portable chest x-ray of 01/08/2015 and CT chest of 01/07/2015 FINDINGS: The opacity adjacent to the left hilum is again noted and by CT chest appears to represent a superior segment left lower lobe lesion. A small left pleural effusion remains. The right lung appears well aerated. Heart size is stable. IMPRESSION: No change in left lower lobe lesion with small left pleural effusion. Electronically Signed   By: PaIvar Drape.D.   On: 01/09/2015 08:01   Dg Chest Port 1 View  01/08/2015  CLINICAL DATA:  Followup respiratory failure EXAM: PORTABLE CHEST - 1 VIEW COMPARISON:  01/07/2015 FINDINGS: Cardiac shadow is stable. Persistent left perihilar density is noted. No new focal infiltrate is seen. Left-sided pleural effusion is again noted. No bony abnormality is seen. IMPRESSION: Stable left pleural effusion and left perihilar rounded density Electronically Signed   By: MaInez Catalina.D.   On: 01/08/2015 08:10   Dg Chest Port 1 View  01/07/2015  CLINICAL DATA:  Left pleural effusion. EXAM: PORTABLE CHEST 1 VIEW COMPARISON:  CT chest January 07, 2015, chest x-ray January 06, 2015 FINDINGS: The mediastinal contour and cardiac silhouette are stable. There is left pleural effusion unchanged compared to prior exam. There is opacity in the superior segment of the left lower lobe unchanged compared to prior chest x-ray consistent with patient's known lung cancer. The visualized skeletal structures are stable.  IMPRESSION: Left pleural effusion is unchanged. Electronically Signed   By: Abelardo Diesel M.D.   On: 01/07/2015 14:40      ASSESSMENT / PLAN:  Dyspnea > resolved Hx asthma and Loeffler's syndrome HCAP? Plan: Would d/c with 3 days Augmentin Stop steroids Resume home Advair  Lung Cancer > non-small cell lung cancer s/p treatment with cryoablation complicated by hemothorax Exudative Pleural Effusion / Hemothorax - s/p left thoracetesis 01/03/15; now recurred (see images above).  Repeat thora 10/11 with bloody fluid returned. Cytology negative Plan: Agree with holding ASA, Eliquis for now Will follow up with Dr. Doren Custard of [facility] next week Agree with need to establish care with medical oncology, reiterated this today We will see him in 2-3 weeks in clinic with a CXR and will likely restart ASA at that point if no growth in the effusion   Roselie Awkward, MD [facility] Pager: [phone] Cell: [phone] After 3pm or if no response, call [phone]

## 2015-01-10 LAB — BODY FLUID CULTURE
Culture: NO GROWTH
SPECIAL REQUESTS: NORMAL

## 2015-01-14 DIAGNOSIS — J942 Hemothorax: Secondary | ICD-10-CM | POA: Diagnosis not present

## 2015-01-14 DIAGNOSIS — C3492 Malignant neoplasm of unspecified part of left bronchus or lung: Secondary | ICD-10-CM | POA: Diagnosis not present

## 2015-01-14 DIAGNOSIS — Z87891 Personal history of nicotine dependence: Secondary | ICD-10-CM | POA: Diagnosis not present

## 2015-01-14 DIAGNOSIS — J90 Pleural effusion, not elsewhere classified: Secondary | ICD-10-CM | POA: Diagnosis not present

## 2015-01-14 DIAGNOSIS — Z79899 Other long term (current) drug therapy: Secondary | ICD-10-CM | POA: Diagnosis not present

## 2015-01-14 DIAGNOSIS — C3412 Malignant neoplasm of upper lobe, left bronchus or lung: Secondary | ICD-10-CM | POA: Diagnosis not present

## 2015-01-14 DIAGNOSIS — G4700 Insomnia, unspecified: Secondary | ICD-10-CM | POA: Diagnosis not present

## 2015-01-14 DIAGNOSIS — J449 Chronic obstructive pulmonary disease, unspecified: Secondary | ICD-10-CM | POA: Diagnosis not present

## 2015-01-14 DIAGNOSIS — R918 Other nonspecific abnormal finding of lung field: Secondary | ICD-10-CM | POA: Diagnosis not present

## 2015-01-14 DIAGNOSIS — I10 Essential (primary) hypertension: Secondary | ICD-10-CM | POA: Diagnosis not present

## 2015-01-15 DIAGNOSIS — J942 Hemothorax: Secondary | ICD-10-CM | POA: Diagnosis not present

## 2015-01-15 DIAGNOSIS — C3432 Malignant neoplasm of lower lobe, left bronchus or lung: Secondary | ICD-10-CM | POA: Diagnosis not present

## 2015-01-15 DIAGNOSIS — B441 Other pulmonary aspergillosis: Secondary | ICD-10-CM | POA: Diagnosis not present

## 2015-01-15 DIAGNOSIS — R042 Hemoptysis: Secondary | ICD-10-CM | POA: Diagnosis not present

## 2015-01-15 DIAGNOSIS — M5136 Other intervertebral disc degeneration, lumbar region: Secondary | ICD-10-CM | POA: Diagnosis not present

## 2015-01-15 DIAGNOSIS — J441 Chronic obstructive pulmonary disease with (acute) exacerbation: Secondary | ICD-10-CM | POA: Diagnosis not present

## 2015-01-15 DIAGNOSIS — J82 Pulmonary eosinophilia, not elsewhere classified: Secondary | ICD-10-CM | POA: Diagnosis not present

## 2015-01-15 DIAGNOSIS — I480 Paroxysmal atrial fibrillation: Secondary | ICD-10-CM | POA: Diagnosis not present

## 2015-01-15 DIAGNOSIS — D649 Anemia, unspecified: Secondary | ICD-10-CM | POA: Diagnosis not present

## 2015-01-15 DIAGNOSIS — C3412 Malignant neoplasm of upper lobe, left bronchus or lung: Secondary | ICD-10-CM | POA: Diagnosis not present

## 2015-01-15 DIAGNOSIS — M1711 Unilateral primary osteoarthritis, right knee: Secondary | ICD-10-CM | POA: Diagnosis not present

## 2015-01-16 ENCOUNTER — Ambulatory Visit: Payer: Medicare Other | Admitting: Pulmonary Disease

## 2015-01-17 ENCOUNTER — Telehealth: Payer: Self-pay | Admitting: Pulmonary Disease

## 2015-01-17 MED ORDER — AZITHROMYCIN 250 MG PO TABS: ORAL_TABLET | ORAL | Status: DC

## 2015-01-17 MED ORDER — METHYLPREDNISOLONE 4 MG PO TBPK: ORAL_TABLET | ORAL | Status: DC

## 2015-01-17 NOTE — Telephone Encounter (Signed)
Called and spoke to pt. Pt c/o increase in SOB, cough with little mucus production - yellow in color, chest soreness pt states is from coughing x 1 day. Pt denies f/c/s and hemoptysis. Pt states he has used his neb meds and has not felt relief from SOB. Pt has been maintaining O2 sats above 90%. No available appts today. Pt has upcoming HFU appt with TP on 10.727.16 with CXR.   Dr. Lake Bells please advise. Thanks.

## 2015-01-17 NOTE — Telephone Encounter (Signed)
Called and spoke to pt. Informed him of the recs per BQ. Rx sent to preferred pharmacy. Pt verbalized understanding and denied any further questions or concerns at this time.

## 2015-01-17 NOTE — Telephone Encounter (Signed)
lmtcb for pt.  

## 2015-01-17 NOTE — Telephone Encounter (Signed)
Patient returning call, can be reached at [phone].

## 2015-01-17 NOTE — Telephone Encounter (Signed)
Medrol dose pack is OK by me

## 2015-01-17 NOTE — Telephone Encounter (Signed)
rx pred taper: Take '40mg'$  po daily for 3 days, then take '30mg'$  po daily for 3 days, then take '20mg'$  po daily for two days, then take '10mg'$  po daily for 2 days zpack Call Monday if no improvement Come to ER if worse over weekend

## 2015-01-17 NOTE — Telephone Encounter (Signed)
Called and spoke to pt. Pt states he has trouble tolerating prednisone (trouble sleeping, jittery), pt is requesting medrol. Pt has taken Medrol '4mg'$  dosepak in the past per pt's chart.   Dr. Lake Bells please advise.

## 2015-01-20 ENCOUNTER — Ambulatory Visit (INDEPENDENT_AMBULATORY_CARE_PROVIDER_SITE_OTHER): Payer: Medicare Other | Admitting: Pulmonary Disease

## 2015-01-20 ENCOUNTER — Encounter: Payer: Self-pay | Admitting: Pulmonary Disease

## 2015-01-20 VITALS — BP 120/74 | HR 80 | Temp 97.8°F | Ht 70.0 in | Wt 166.6 lb

## 2015-01-20 DIAGNOSIS — J45901 Unspecified asthma with (acute) exacerbation: Secondary | ICD-10-CM

## 2015-01-20 MED ORDER — METHYLPREDNISOLONE ACETATE 80 MG/ML IJ SUSP
80.0000 mg | Freq: Once | INTRAMUSCULAR | Status: AC
  Administered 2015-01-20: 80 mg via INTRAMUSCULAR

## 2015-01-20 NOTE — Patient Instructions (Signed)
Medrol shot today  Follow up this week with Tammy Parrett

## 2015-01-20 NOTE — Progress Notes (Signed)
Chief Complaint  Patient presents with  . Acute Visit    pt c/o increase  SOB, pt had a procedure done on his left lung 2 weeks ago. pt states hes had 2 hemothorax since the procedure. pt states he gets episoides where he is wheezing and gets SOB. pt completed medrol dose pack and z pack with some relief.     History of Present Illness: [name] is a 81 y.o. male with NSCLC and dyspnea.  He has hx of asthma, Loeffler's syndrome.  He was recently dx with lung cancer.  He had ablation therapy for this.  He has hx of A fib, and has been on eliquis.  He developed hemothorax, and required hospitalization for this.  He was improved, but notice more trouble with his breathing for the past 1 week.  He has been getting sinus congestion with post nasal drip.  He also has tightness and soreness in his chest.  He has cough with clear sputum, and has been wheezing.  He was started on zpak and medrol last week.  He was having some improvement, but his symptoms got worse today.    He has been using advair, spiriva, singulair, flonase, and zyrtec.  He has been using albuterol more often and this helps.  He had CXR on 01/14/15 at [facility] >> report reviewed.  Past medical hx, Past surgical hx, Medications, Allergies, Family hx, Social hx all reviewed.   Physical Exam: BP 120/74 mmHg  Pulse 80  Temp(Src) 97.8 F (36.6 C) (Oral)  Ht '5\' 10"'$  (1.778 m)  Wt 166 lb 9.6 oz (75.569 kg)  BMI 23.90 kg/m2  SpO2 94%  General - No distress ENT - No sinus tenderness, no oral exudate, no LAN Cardiac - s1s2 regular, no murmur Chest - b/l expiratory wheezing that partially clear with cough. Back - No focal tenderness Abd - Soft, non-tender Ext - No edema Neuro - Normal strength Skin - No rashes Psych - normal mood, and behavior   Assessment/Plan:  Acute asthmatic bronchitis. Plan: - will give medrol injection today - he is to complete zpak and medrol dose - continue spiriva, advair, singulair -  advised him to take albuterol at noon on scheduled basis for now, and prn in between - defer CXR for now >> he had CXR at [facility] on 01/14/15   Chesley Mires, MD [place] Pager:  [phone]

## 2015-01-23 ENCOUNTER — Ambulatory Visit (INDEPENDENT_AMBULATORY_CARE_PROVIDER_SITE_OTHER)
Admission: RE | Admit: 2015-01-23 | Discharge: 2015-01-23 | Disposition: A | Discharge status: Home / Self Care | Payer: Medicare Other | Source: Ambulatory Visit | Attending: Adult Health | Admitting: Adult Health

## 2015-01-23 ENCOUNTER — Encounter (HOSPITAL_COMMUNITY): Payer: Self-pay | Admitting: Emergency Medicine

## 2015-01-23 ENCOUNTER — Ambulatory Visit (INDEPENDENT_AMBULATORY_CARE_PROVIDER_SITE_OTHER): Payer: Medicare Other | Admitting: Adult Health

## 2015-01-23 ENCOUNTER — Other Ambulatory Visit: Payer: Self-pay | Admitting: Pulmonary Disease

## 2015-01-23 ENCOUNTER — Observation Stay (HOSPITAL_COMMUNITY)
Admission: EM | Admit: 2015-01-23 | Discharge: 2015-01-24 | Disposition: A | Discharge status: Home / Self Care | Payer: Medicare Other | Attending: Internal Medicine | Admitting: Internal Medicine

## 2015-01-23 ENCOUNTER — Encounter: Payer: Self-pay | Admitting: Adult Health

## 2015-01-23 VITALS — BP 116/70 | HR 84 | Temp 97.6°F | Ht 70.0 in | Wt 165.0 lb

## 2015-01-23 DIAGNOSIS — J441 Chronic obstructive pulmonary disease with (acute) exacerbation: Secondary | ICD-10-CM | POA: Diagnosis not present

## 2015-01-23 DIAGNOSIS — M1990 Unspecified osteoarthritis, unspecified site: Secondary | ICD-10-CM | POA: Insufficient documentation

## 2015-01-23 DIAGNOSIS — R0600 Dyspnea, unspecified: Secondary | ICD-10-CM

## 2015-01-23 DIAGNOSIS — J4531 Mild persistent asthma with (acute) exacerbation: Secondary | ICD-10-CM | POA: Diagnosis not present

## 2015-01-23 DIAGNOSIS — J9601 Acute respiratory failure with hypoxia: Secondary | ICD-10-CM | POA: Diagnosis not present

## 2015-01-23 DIAGNOSIS — C3490 Malignant neoplasm of unspecified part of unspecified bronchus or lung: Secondary | ICD-10-CM

## 2015-01-23 DIAGNOSIS — R918 Other nonspecific abnormal finding of lung field: Secondary | ICD-10-CM | POA: Diagnosis not present

## 2015-01-23 DIAGNOSIS — Z9889 Other specified postprocedural states: Secondary | ICD-10-CM

## 2015-01-23 DIAGNOSIS — I482 Chronic atrial fibrillation: Secondary | ICD-10-CM | POA: Diagnosis not present

## 2015-01-23 DIAGNOSIS — Z85118 Personal history of other malignant neoplasm of bronchus and lung: Secondary | ICD-10-CM | POA: Insufficient documentation

## 2015-01-23 DIAGNOSIS — Z86010 Personal history of colonic polyps: Secondary | ICD-10-CM | POA: Insufficient documentation

## 2015-01-23 DIAGNOSIS — I10 Essential (primary) hypertension: Secondary | ICD-10-CM | POA: Diagnosis not present

## 2015-01-23 DIAGNOSIS — J9600 Acute respiratory failure, unspecified whether with hypoxia or hypercapnia: Secondary | ICD-10-CM | POA: Diagnosis present

## 2015-01-23 DIAGNOSIS — Z7901 Long term (current) use of anticoagulants: Secondary | ICD-10-CM | POA: Insufficient documentation

## 2015-01-23 DIAGNOSIS — Z85828 Personal history of other malignant neoplasm of skin: Secondary | ICD-10-CM | POA: Insufficient documentation

## 2015-01-23 DIAGNOSIS — Z87891 Personal history of nicotine dependence: Secondary | ICD-10-CM | POA: Insufficient documentation

## 2015-01-23 DIAGNOSIS — J90 Pleural effusion, not elsewhere classified: Secondary | ICD-10-CM

## 2015-01-23 DIAGNOSIS — R0602 Shortness of breath: Secondary | ICD-10-CM | POA: Diagnosis present

## 2015-01-23 DIAGNOSIS — I4891 Unspecified atrial fibrillation: Secondary | ICD-10-CM | POA: Diagnosis present

## 2015-01-23 LAB — CBC
HEMATOCRIT: 34.3 % — AB (ref 39.0–52.0)
Hemoglobin: 11 g/dL — ABNORMAL LOW (ref 13.0–17.0)
MCH: 29.6 pg (ref 26.0–34.0)
MCHC: 32.1 g/dL (ref 30.0–36.0)
MCV: 92.5 fL (ref 78.0–100.0)
Platelets: 248 10*3/uL (ref 150–400)
RBC: 3.71 MIL/uL — ABNORMAL LOW (ref 4.22–5.81)
RDW: 15.1 % (ref 11.5–15.5)
WBC: 10.1 10*3/uL (ref 4.0–10.5)

## 2015-01-23 LAB — I-STAT TROPONIN, ED: TROPONIN I, POC: 0.08 ng/mL (ref 0.00–0.08)

## 2015-01-23 LAB — BASIC METABOLIC PANEL
Anion gap: 9 (ref 5–15)
BUN: 23 mg/dL — AB (ref 6–20)
CALCIUM: 8.9 mg/dL (ref 8.9–10.3)
CO2: 27 mmol/L (ref 22–32)
Chloride: 95 mmol/L — ABNORMAL LOW (ref 101–111)
Creatinine, Ser: 0.91 mg/dL (ref 0.61–1.24)
GFR calc Af Amer: >60 mL/min (ref >60)
GLUCOSE: 117 mg/dL — AB (ref 65–99)
Potassium: 4.2 mmol/L (ref 3.5–5.1)
Sodium: 131 mmol/L — ABNORMAL LOW (ref 135–145)

## 2015-01-23 LAB — PROTIME-INR
INR: 1.2 (ref 0.00–1.49)
Prothrombin Time: 15.3 seconds — ABNORMAL HIGH (ref 11.6–15.2)

## 2015-01-23 NOTE — Progress Notes (Signed)
Subjective:    Patient ID: [name], male    DOB: 03/05/1925, 81 y.o.   MRN: [id number]  HPI 81 year old male former smoker , recently diagnosed with non-small cell lung carcinoma status post cryoablation of the left lower lobe at [facility] on 01/02/2015  Hx of Loefflers Syndome    01/23/2015 Follow up : Lung cancer, pleural effusion , Asthma/Loefflers syndrome.  Patient returns for a follow-up visit. Patient was recently diagnosed with non-small cell lung carcinoma status post cryoablation of the left lower lobe nodule  at [facility] on 01/02/2015 Unfortunately, this was complicated by a left-sided hemothorax requiring 960 cc of fluid be removed via  thoracentesis. He was  on Eliquis for chronic A. Fib -this is currently on hold.  Patient was discharged from [facility] and unfortunately was readmitted to [facility] on October 9 th the night for worsening shortness of breath and hemoptysis. CT chest showed distant left lower lobe nodule. He had bilateral pleural effusions, left than right.. Patient underwent a thoracentesis with 700 cc of bloody fluid removed. Cytology was neg malignant cells. Symptoms improved and he was discharged 01/09/15  He was seen back at at [facility] 01/14/15 , w/ reported stable cxr.  Patient was seen in the office 3 days ago for worsening shortness of breath and given a prednisone taper for asthma flare .  Returns today without significant improvement in symptoms. Continues to have shortness of breath. Chest x-ray today shows an increased left-sided pleural effusion. He denies hemoptysis , chest pain,orthopnea , edema or fever.      Review of Systems  Constitutional:   No  weight loss, night sweats,  Fevers, chills,  +fatigue, or  lassitude.  HEENT:   No headaches,  Difficulty swallowing,  Tooth/dental problems, or  Sore throat,                No sneezing, itching, ear ache, nasal congestion, post nasal drip,   CV:  No chest pain,   Orthopnea, PND, swelling in lower extremities, anasarca, dizziness, palpitations, syncope.   GI  No heartburn, indigestion, abdominal pain, nausea, vomiting, diarrhea, change in bowel habits, loss of appetite, bloody stools.   Resp:.  No chest wall deformity  Skin: no rash or lesions.  GU: no dysuria, change in color of urine, no urgency or frequency.  No flank pain, no hematuria   MS:  No joint pain or swelling.  No decreased range of motion.  No back pain.  Psych:  No change in mood or affect. No depression or anxiety.  No memory loss.          Objective:   Physical Exam  GEN: A/Ox3; pleasant , NAD, elderly   HEENT:  [place]/AT,  EACs-clear, TMs-wnl, NOSE-clear, THROAT-clear, no lesions, no postnasal drip or exudate noted.   NECK:  Supple w/ fair ROM; no JVD; normal carotid impulses w/o bruits; no thyromegaly or nodules palpated; no lymphadenopathy.  RESP  Decreased BS in left , .no accessory muscle use, no dullness to percussion  CARD:  RRR, no m/r/g  , no peripheral edema, pulses intact, no cyanosis or clubbing.  GI:   Soft & nt; nml bowel sounds; no organomegaly or masses detected.  Musco: Warm bil, no deformities or joint swelling noted.   Neuro: alert, no focal deficits noted.    Skin: Warm, no lesions or rashes  CXR 01/23/15 >Interval decrease in the size of the left upper lobe lesion but mild  interval increase in the volume of pleural fluid on the left. There is no pneumothorax nor mediastinal shift. There is stable changes of COPD.       Assessment & Plan:

## 2015-01-23 NOTE — ED Notes (Signed)
Patient reported that he would feel better if he were wearing oxygen. Oxygen at 1.5 LPM via nasal cannula applied to patient. Patient reports feeling more comfortable.

## 2015-01-23 NOTE — ED Notes (Signed)
Patient seen by pulmonologist today. Left pleural effusion noted. Patient reported that he was to have procedure performed to drain pleural effusion tomorrow, but discomfort/shortness of breath increased since appt.

## 2015-01-23 NOTE — H&P (Signed)
History and Physical  Patient Name: [name]     NGE:[id number]    DOB: [birth year]    DOA: 01/23/2015 Referring physician: Hoyle Sauer, MD PCP: [name] Redwood, MD      Chief Complaint: Dyspnea  HPI: [name] CURRENT is a 81 y.o. male with a past medical history significant for A. fib off blood thinners, HTN, COPD, GERD, and recently diagnosed NSCLC status post cryoablation this month c/b hemothorax who presents with dyspnea.  This is a patient of [name],  whose chronology is as follows from a previous pulmonology note: 12/11/14 - seen in clinic by [name] for lung mass 12/18/14 - navigational bronchoscopy with biopsies by [name] > pathology noteable for NSCLC; probably adenocarcinoma. 01/02/15 - cryoablation of left lower lobe (at [facility]). 01/03/15 - left sided [facility]m [facility] SO[facility] and hemoptysis. 01/07/15 - PCCM consulted and thoracentesis done with hemorrhagic fluid  Since discharge 2 weeks ago the patient has been feeling better until this week when he has had recurrence of dyspnea and discomfort in the left chest. Over the last 3 days this has gotten worse, today he saw [name] who recommended thoracentesis which was being arranged as an outpatient. However, in the evening the patient was at home alone and felt too dyspneic to stay home, and so presented to the ER. He denies fever, hemoptysis, increasing sputum, wheezing, or chest congestion/tightness.   In the ED, the patient was saturating well on ambient air, but tachypneic and tachycardic. His chest x-ray showed enlargement of his left pleural effusion, and the patient felt better with supplemental oxygen. TRH was asked to admit for observation and thoracentesis in the hospital.     Review of Systems:  Pt complains of  dyspnea, left chest discomfort, increased work of breathing. Pt denies any fever, sputum, chills, wheezing,  hemoptysis.  All other systems negative except as just noted or noted in the history of present illness.  Allergies  Allergen Reactions  . Sulfonamide Derivatives Other (See Comments)    Unk    Prior to Admission medications   Medication Sig Start Date End Date Taking? Authorizing Provider  albuterol (PROVENTIL HFA;VENTOLIN HFA) 108 (90 BASE) MCG/ACT inhaler Inhale 1-2 puffs into the lungs every 6 (six) hours as needed for wheezing or shortness of breath.   Yes Historical Provider, MD  Cetirizine HCl (ZYRTEC ALLERGY PO) Take 10 mg by mouth at bedtime.    Yes Historical Provider, MD  CRESTOR 10 MG tablet Take 5 mg by mouth at bedtime.  08/19/14  Yes Historical Provider, MD  finasteride (PROSCAR) 5 MG tablet Take 5 mg by mouth daily.     Yes Historical Provider, MD  fluticasone (FLONASE) 50 MCG/ACT nasal spray Place 2 sprays into the nose daily.     Yes Historical Provider, MD  Fluticasone-Salmeterol (ADVAIR DISKUS) 250-50 MCG/DOSE AEPB Inhale 1 puff into the lungs 2 (two) times daily.     Yes Historical Provider, MD  Guaifenesin 1200 MG TB12 Take 1,200 mg by mouth 2 (two) times daily.   Yes Historical Provider, MD  montelukast (SINGULAIR) 10 MG tablet Take 10 mg by mouth at bedtime.     Yes Historical Provider, MD  omeprazole (PRILOSEC) 20 MG capsule Take 20 mg by mouth every evening.    Yes Historical Provider, MD  temazepam (RESTORIL) 15 MG capsule Take 15 mg by mouth at bedtime.    Yes  Historical Provider, MD  Tiotropium Bromide Monohydrate (SPIRIVA RESPIMAT) 2.5 MCG/ACT AERS Inhale 2 puffs into the lungs daily.   Yes Historical Provider, MD  verapamil (COVERA HS) 180 MG (CO) 24 hr tablet Take 1 tablet (180 mg total) by mouth at bedtime. 01/16/13  Yes [name] Lance, MD  XOPENEX HFA 45 MCG/ACT inhaler 1-2 puffs every 6 (six) hours as needed for shortness of breath.  09/10/14  Yes Historical Provider, MD  zolpidem (AMBIEN) 10 MG tablet Take 10 mg by mouth at bedtime.    Yes Historical Provider,  MD    Past Medical History  Diagnosis Date  . Bradycardia   . Palpitations   . Asthma   . Hemorrhoids   . Hx of adenomatous colonic polyps 02/1997  . Atrial fibrillation ([place])   . Hypertension   . Diverticulosis of colon   . COPD (chronic obstructive pulmonary disease) (HCC)     mild  . Bowel obstruction ([place])   . Arthritis     osteoarthritis  . Cancer ([place])     skin cancer  . Loffler's syndrome [facility])     Past Surgical History  Procedure Laterality Date  . Colon resection    . Total hip arthroplasty Right   . Appendectomy    . Laminectomy      Right L4-L5 Laminectomy  . Cystoscopy    . Rotator cuff repair Right   . Umbilical hernia repair    . Video bronchoscopy with endobronchial navigation N/A 12/18/2014    Procedure: VIDEO BRONCHOSCOPY WITH ENDOBRONCHIAL NAVIGATION ;  Surgeon: Collene Gobble, MD;  Location: [facility] OR;  Service: Thoracic;  Laterality: N/A;    Family history: family history includes Alzheimer's disease in his sister; Autoimmune disease in his brother; Leukemia in his brother; Lung cancer (age of onset: 47) in his father; Stroke (age of onset: 75) in his mother.  Social History: Patient lives alone.  His wife lives in a nursing home. He is a remote former smoker. He is a retired Engineer, drilling having worked in Designer, television/film set for [employer]. He has a daughter who lives in town.       Physical Exam: BP 122/66 mmHg  Pulse 83  Temp(Src) 98.2 F (36.8 C) (Oral)  Resp 20  SpO2 99% General appearance: Elderly well-developed  male, alert and in no acute distress.   Eyes: Anicteric, conjunctiva pink, lids and lashes normal.     ENT: No nasal deformity, discharge, or epistaxis.  Nasal cannula in place.   Lymph: No cervical, supraclavicular or axillary lymphadenopathy. Skin: Warm and dry.  No jaundice.  No suspicious rashes or lesions. Cardiac: RRR, nl S1-S2, slight SEM.  No carotid bruits.  Capillary refill is brisk.  JVP visible but normal.  No LE  edema.  Radial  pulses 2+ and symmetric. Respiratory: Tachypneic.  No rales or wheezes.  Lung sounds diminished at left base. Abdomen: Abdomen soft without rigidity.  No TTP. No ascites, distension.   MSK: No deformities or effusions. Neuro: Sensorium intact and responding to questions, attention normal.  Speech is fluent.  Moves all extremities equally and with normal coordination.    Psych: Behavior appropriate.  Affect normal.  No evidence of aural or visual hallucinations or delusions.       Labs on Admission:  The metabolic panel shows mild stable hyponatremia. The complete blood count showsnormal WBC, chronic stable normocytic anemia, and normal platelet count.  The INR is normal. The troponin is normal.   Radiological Exams on  Admission: Personally reviewed: Dg Chest 2 View 01/23/2015 Increased left pleural effusion from 10/13.  No focal airspace disease noted.  Echocardiogram 01/07/2015:  EF 65%  PFTs 2016: Decreased FVC, DLCO and FEV1 with mixed obstructive restrictive pattern   EKG: Independently reviewed. Aflutter with rate 91.    Assessment/Plan  1. Pleural effusion causing dyspnea:  This is new.  Other diagnostic considerations for the patient's dyspnea are bronchospasm, CHF, or pneumonia, although these are doubted relative to recollecting effusion. -Diagnostic and therapeutic thoracentesis by IR -Pleural fluid culture, cell count, LDH and protein      2. COPD:  -bronchodilators every 4 hours when necessary   3. Atrial fibrillation:  Stable. Chads 2 vas 4.  the patient is off Eliquis since his hemothorax.  -continue verapamil   4. HTN:  Stable. -Continue Crestor and verapamil     DVT PPx: SCDs Diet: Regular Consultants: IR Code Status: Full Medical decision making: What exists of the patient's previous chart was reviewed in depth and the case was discussed with Dr. Estanislado Spire. Patient seen 11:41 PM on 01/23/2015.  Disposition Plan:  Admit for  obs.  [place] tomorrow.  If improved after, can be discharged home.      Edwin Dada [facility] Pager [phone]

## 2015-01-23 NOTE — ED Notes (Addendum)
Pt. reports worsening SOB with occasional dry cough onset last week worse today , seen by his PCP today diagnosed with pleural effusion . Denies fever . History of malignant lung mass - ablation done at [facility] 3 weeks ago . Chest x-ray done today .

## 2015-01-23 NOTE — ED Provider Notes (Signed)
CSN: [id number]     Arrival date & time 01/23/15  2007 History   First MD Initiated Contact with Patient 01/23/15 2134     Chief Complaint  Patient presents with  . Shortness of Breath     (Consider location/radiation/quality/duration/timing/severity/associated sxs/prior Treatment) Patient is a 81 y.o. male presenting with shortness of breath. The history is provided by the patient.  Shortness of Breath Severity:  Moderate Onset quality:  Gradual Timing:  Constant Progression:  Waxing and waning Chronicity:  Recurrent Context: activity   Relieved by:  Nothing Worsened by:  Activity and coughing Ineffective treatments:  Inhaler Associated symptoms: cough, sputum production and wheezing   Associated symptoms: no abdominal pain, no chest pain, no diaphoresis, no fever, no headaches, no hemoptysis, no neck pain, no rash, no sore throat, no syncope, no swollen glands and no vomiting   Risk factors: hx of cancer and recent surgery   Risk factors: no hx of PE/DVT and no prolonged immobilization     Past Medical History  Diagnosis Date  . Bradycardia   . Palpitations   . Asthma   . Hemorrhoids   . Hx of adenomatous colonic polyps 02/1997  . Atrial fibrillation ([place])   . Hypertension   . Diverticulosis of colon   . COPD (chronic obstructive pulmonary disease) (HCC)     mild  . Bowel obstruction ([place])   . Arthritis     osteoarthritis  . Cancer ([place])     skin cancer  . Loffler's syndrome [facility])    Past Surgical History  Procedure Laterality Date  . Colon resection    . Total hip arthroplasty Right   . Appendectomy    . Laminectomy      Right L4-L5 Laminectomy  . Cystoscopy    . Rotator cuff repair Right   . Umbilical hernia repair    . Video bronchoscopy with endobronchial navigation N/A 12/18/2014    Procedure: VIDEO BRONCHOSCOPY WITH ENDOBRONCHIAL NAVIGATION ;  Surgeon: Collene Gobble, MD;  Location: [facility] OR;  Service: Thoracic;  Laterality: N/A;   Family History   Problem Relation Age of Onset  . Lung cancer Father 39  . Stroke Mother 45  . Leukemia Brother   . Alzheimer's disease Sister   . Autoimmune disease Brother     Polyarteritis nodosa   Social History  Substance Use Topics  . Smoking status: Former Smoker -- 0.00 packs/day for 13 years    Types: Cigarettes    Quit date: 03/29/1956  . Smokeless tobacco: Never Used  . Alcohol Use: Yes    Review of Systems  Constitutional: Positive for fatigue. Negative for fever and diaphoresis.  HENT: Negative for facial swelling and sore throat.   Respiratory: Positive for cough, sputum production, shortness of breath and wheezing. Negative for hemoptysis.   Cardiovascular: Negative for chest pain and syncope.  Gastrointestinal: Negative for vomiting and abdominal pain.  Genitourinary: Negative for dysuria.  Musculoskeletal: Negative for back pain and neck pain.  Skin: Negative for rash.  Neurological: Negative for headaches.  Psychiatric/Behavioral: Negative for confusion.      Allergies  Sulfonamide derivatives  Home Medications   Prior to Admission medications   Medication Sig Start Date End Date Taking? Authorizing Provider  albuterol (PROVENTIL HFA;VENTOLIN HFA) 108 (90 BASE) MCG/ACT inhaler Inhale 1-2 puffs into the lungs every 6 (six) hours as needed for wheezing or shortness of breath.   Yes Historical Provider, MD  Cetirizine HCl (ZYRTEC ALLERGY PO) Take 10 mg by mouth  at bedtime.    Yes Historical Provider, MD  CRESTOR 10 MG tablet Take 5 mg by mouth at bedtime.  08/19/14  Yes Historical Provider, MD  finasteride (PROSCAR) 5 MG tablet Take 5 mg by mouth daily.     Yes Historical Provider, MD  fluticasone (FLONASE) 50 MCG/ACT nasal spray Place 2 sprays into the nose daily.     Yes Historical Provider, MD  Fluticasone-Salmeterol (ADVAIR DISKUS) 250-50 MCG/DOSE AEPB Inhale 1 puff into the lungs 2 (two) times daily.     Yes Historical Provider, MD  Guaifenesin 1200 MG TB12 Take 1,200  mg by mouth 2 (two) times daily.   Yes Historical Provider, MD  montelukast (SINGULAIR) 10 MG tablet Take 10 mg by mouth at bedtime.     Yes Historical Provider, MD  omeprazole (PRILOSEC) 20 MG capsule Take 20 mg by mouth every evening.    Yes Historical Provider, MD  temazepam (RESTORIL) 15 MG capsule Take 15 mg by mouth at bedtime.    Yes Historical Provider, MD  Tiotropium Bromide Monohydrate (SPIRIVA RESPIMAT) 2.5 MCG/ACT AERS Inhale 2 puffs into the lungs daily.   Yes Historical Provider, MD  verapamil (COVERA HS) 180 MG (CO) 24 hr tablet Take 1 tablet (180 mg total) by mouth at bedtime. 01/16/13  Yes Evans Lance, MD  XOPENEX HFA 45 MCG/ACT inhaler 1-2 puffs every 6 (six) hours as needed for shortness of breath.  09/10/14  Yes Historical Provider, MD  zolpidem (AMBIEN) 10 MG tablet Take 10 mg by mouth at bedtime.    Yes Historical Provider, MD   BP 113/74 mmHg  Pulse 75  Temp(Src) 98.2 F (36.8 C) (Oral)  Resp 19  SpO2 96% Physical Exam  Constitutional: He is oriented to person, place, and time. He appears well-developed and well-nourished. No distress.  HENT:  Head: Normocephalic and atraumatic.  Right Ear: External ear normal.  Left Ear: External ear normal.  Nose: Nose normal.  Mouth/Throat: Oropharynx is clear and moist. No oropharyngeal exudate.  Eyes: Conjunctivae and EOM are normal. Pupils are equal, round, and reactive to light. Right eye exhibits no discharge. Left eye exhibits no discharge. No scleral icterus.  Neck: Normal range of motion. Neck supple. No JVD present. No tracheal deviation present. No thyromegaly present.  Cardiovascular: Normal rate, regular rhythm, normal heart sounds and intact distal pulses.   Pulmonary/Chest: Effort normal. No stridor. No respiratory distress. He has no wheezes. He has no rales. He exhibits no tenderness.  Abdominal: Soft. He exhibits no distension. There is no tenderness.  Musculoskeletal: Normal range of motion. He exhibits no  edema or tenderness.  Lymphadenopathy:    He has no cervical adenopathy.  Neurological: He is alert and oriented to person, place, and time.  Skin: Skin is warm and dry. No rash noted. He is not diaphoretic. No erythema. No pallor.  Psychiatric: He has a normal mood and affect. His behavior is normal. Judgment and thought content normal.  Nursing note and vitals reviewed.   ED Course  Procedures (including critical care time) Labs Review Labs Reviewed  BASIC METABOLIC PANEL - Abnormal; Notable for the following:    Sodium 131 (*)    Chloride 95 (*)    Glucose, Bld 117 (*)    BUN 23 (*)    All other components within normal limits  CBC - Abnormal; Notable for the following:    RBC 3.71 (*)    Hemoglobin 11.0 (*)    HCT 34.3 (*)  All other components within normal limits  PROTIME-INR - Abnormal; Notable for the following:    Prothrombin Time 15.3 (*)    All other components within normal limits  BASIC METABOLIC PANEL  CBC  I-STAT TROPOININ, ED    Imaging Review Dg Chest 2 View  01/23/2015  CLINICAL DATA:  Progressive or worsening of shortness of breath over the past week; the patient underwent lung tumor ablation 3 weeks ago, history of COPD. EXAM: CHEST  2 VIEW COMPARISON:  PA and lateral chest x-ray of January 09, 2015 FINDINGS: The mass in the superior segment of the left lower lobe has decreased in size. However, there has been mild interval increase in the left pleural effusion since the previous study. The right lung exhibits no acute abnormality. The interstitial markings of both lungs are mildly prominent though stable. The heart is top-normal in size. There is no pulmonary vascular congestion. The bony thorax exhibits no acute abnormality. IMPRESSION: Interval decrease in the size of the left upper lobe lesion but mild interval increase in the volume of pleural fluid on the left. There is no pneumothorax nor mediastinal shift. There is stable changes of COPD.  Electronically Signed   By: David  Martinique M.D.   On: 01/23/2015 15:48   I have personally reviewed and evaluated these images and lab results as part of my medical decision-making.   EKG Interpretation   Date/Time:  Thursday January 23 2015 20:10:47 EDT Ventricular Rate:  91 PR Interval:    QRS Duration: 82 QT Interval:  356 QTC Calculation: 437 R Axis:   95 Text Interpretation:  Atrial flutter with variable A-V block Rightward  axis Anteroseptal infarct , age undetermined Abnormal ECG Confirmed by  Jeneen Rinks  MD, [place] ([id number]) on 01/23/2015 10:18:35 PM      MDM   Final diagnoses:  Dyspnea    Patient has a history of bronchogeniccarcinoma. He had a left lower lobe cryoablation performed recently. After this he had some episodes of hemothorax, associated with use of Elequis for atrial fibrillation. Patient is currently off of this medication here. He was told he had a pleural effusion by his pulmonologist today when he saw him in the office for shortness of breath. He is planned for outpatient evaluation by IR for repeat drainage. Patient states he continued to be uncomfortable and short of breath at home presented for evaluation. Patient was placed on nasal cannula with some improvement in symptoms. Basic labs obtained which did not indicate need for any other acute interventions at this time. Patient's chest x-ray shows a left-sided pleural effusion patient's oxygen level stable currently on 2 L by nasal cannula. Patient was discussed with medicine for admission, for evaluation as an inpatient by IR for pleural effusion drainage.  Patient care was discussed with my attending, Dr. Jeneen Rinks.    Hoyle Sauer, MD 01/24/15 0139  Tanna Furry, MD 02/12/15 [phone]

## 2015-01-23 NOTE — ED Provider Notes (Signed)
Patient seen and evaluated. Discussed with resident. Patient with history of directed drainage cutaneous cryo-treatment of bronchitic carcinoma. Was on request for A. fib prior to the procedure. Developed hemothorax as required thoracentesis 2. Progressive shortness of breath. Seen by pulmonary today. Scheduled for outpatient thoracentesis. Became more short of breath and presents here. Not hypoxemic or febrile. Is quite stable. Does not require acute thoracentesis now. Will likely require IR directed thoracentesis, and ?consideration for Pleur-evac catheter if continues to be recurrent. Not currently anticoagulated.   Tanna Furry, MD 01/23/15 [phone]

## 2015-01-23 NOTE — ED Notes (Signed)
Resident at bedside at this time.

## 2015-01-23 NOTE — Patient Instructions (Addendum)
We are setting you up for a therapeutic thoracentesis on left for pleural effusion .  Follow up  With Dr. Lake Bells in 2-3 weeks and As needed   Please contact office for sooner follow up if symptoms do not improve or worsen or seek emergency care

## 2015-01-24 ENCOUNTER — Telehealth: Payer: Self-pay | Admitting: Adult Health

## 2015-01-24 ENCOUNTER — Observation Stay (HOSPITAL_COMMUNITY): Payer: Medicare Other

## 2015-01-24 DIAGNOSIS — I10 Essential (primary) hypertension: Secondary | ICD-10-CM

## 2015-01-24 DIAGNOSIS — J90 Pleural effusion, not elsewhere classified: Secondary | ICD-10-CM

## 2015-01-24 DIAGNOSIS — J9601 Acute respiratory failure with hypoxia: Secondary | ICD-10-CM | POA: Diagnosis not present

## 2015-01-24 DIAGNOSIS — I482 Chronic atrial fibrillation: Secondary | ICD-10-CM

## 2015-01-24 DIAGNOSIS — J441 Chronic obstructive pulmonary disease with (acute) exacerbation: Secondary | ICD-10-CM | POA: Diagnosis not present

## 2015-01-24 LAB — CBC
HEMATOCRIT: 32 % — AB (ref 39.0–52.0)
HEMOGLOBIN: 10.3 g/dL — AB (ref 13.0–17.0)
MCH: 29.6 pg (ref 26.0–34.0)
MCHC: 32.2 g/dL (ref 30.0–36.0)
MCV: 92 fL (ref 78.0–100.0)
Platelets: 222 10*3/uL (ref 150–400)
RBC: 3.48 MIL/uL — AB (ref 4.22–5.81)
RDW: 15.2 % (ref 11.5–15.5)
WBC: 9.1 10*3/uL (ref 4.0–10.5)

## 2015-01-24 LAB — BASIC METABOLIC PANEL
ANION GAP: 11 (ref 5–15)
BUN: 18 mg/dL (ref 6–20)
CO2: 28 mmol/L (ref 22–32)
Calcium: 9.2 mg/dL (ref 8.9–10.3)
Chloride: 97 mmol/L — ABNORMAL LOW (ref 101–111)
Creatinine, Ser: 0.79 mg/dL (ref 0.61–1.24)
GFR calc Af Amer: >60 mL/min (ref >60)
Glucose, Bld: 87 mg/dL (ref 65–99)
POTASSIUM: 4.2 mmol/L (ref 3.5–5.1)
SODIUM: 136 mmol/L (ref 135–145)

## 2015-01-24 LAB — BODY FLUID CELL COUNT WITH DIFFERENTIAL
Eos, Fluid: 3 %
LYMPHS FL: 43 %
Monocyte-Macrophage-Serous Fluid: 13 % — ABNORMAL LOW (ref 50–90)
NEUTROPHIL FLUID: 41 % — AB (ref 0–25)
Total Nucleated Cell Count, Fluid: 1151 cu mm — ABNORMAL HIGH (ref 0–1000)

## 2015-01-24 LAB — LACTATE DEHYDROGENASE, PLEURAL OR PERITONEAL FLUID: LD FL: 258 U/L — AB (ref 3–23)

## 2015-01-24 LAB — GRAM STAIN

## 2015-01-24 MED ORDER — PANTOPRAZOLE SODIUM 40 MG PO TBEC
40.0000 mg | DELAYED_RELEASE_TABLET | Freq: Every day | ORAL | Status: DC
  Administered 2015-01-24: 40 mg via ORAL
  Filled 2015-01-24: qty 1

## 2015-01-24 MED ORDER — LIDOCAINE HCL (PF) 1 % IJ SOLN
INTRAMUSCULAR | Status: AC
  Filled 2015-01-24: qty 10

## 2015-01-24 MED ORDER — VERAPAMIL HCL ER 180 MG PO TBCR
180.0000 mg | EXTENDED_RELEASE_TABLET | Freq: Every day | ORAL | Status: DC
  Filled 2015-01-24: qty 1

## 2015-01-24 MED ORDER — OXYCODONE-ACETAMINOPHEN 5-325 MG PO TABS
1.0000 | ORAL_TABLET | ORAL | Status: DC | PRN
  Administered 2015-01-24: 1 via ORAL
  Filled 2015-01-24: qty 1

## 2015-01-24 MED ORDER — IPRATROPIUM-ALBUTEROL 0.5-2.5 (3) MG/3ML IN SOLN: 3.0000 mL | RESPIRATORY_TRACT | Status: DC | PRN

## 2015-01-24 MED ORDER — MOMETASONE FURO-FORMOTEROL FUM 100-5 MCG/ACT IN AERO
2.0000 | INHALATION_SPRAY | Freq: Two times a day (BID) | RESPIRATORY_TRACT | Status: DC
  Filled 2015-01-24: qty 8.8

## 2015-01-24 MED ORDER — TEMAZEPAM 15 MG PO CAPS
15.0000 mg | ORAL_CAPSULE | Freq: Every day | ORAL | Status: DC
Start: 2015-01-24 — End: 2015-01-24

## 2015-01-24 MED ORDER — ROSUVASTATIN CALCIUM 5 MG PO TABS
5.0000 mg | ORAL_TABLET | Freq: Every day | ORAL | Status: DC
  Filled 2015-01-24: qty 1

## 2015-01-24 MED ORDER — ZOLPIDEM TARTRATE 5 MG PO TABS
10.0000 mg | ORAL_TABLET | Freq: Every evening | ORAL | Status: DC | PRN
  Administered 2015-01-24: 10 mg via ORAL
  Filled 2015-01-24: qty 2

## 2015-01-24 MED ORDER — OXYCODONE-ACETAMINOPHEN 5-325 MG PO TABS: 1.0000 | ORAL_TABLET | Freq: Four times a day (QID) | ORAL | Status: DC | PRN

## 2015-01-24 MED ORDER — TIOTROPIUM BROMIDE MONOHYDRATE 18 MCG IN CAPS
1.0000 | ORAL_CAPSULE | Freq: Every day | RESPIRATORY_TRACT | Status: DC
  Filled 2015-01-24: qty 5

## 2015-01-24 NOTE — Telephone Encounter (Signed)
Spoke with pt's daughter, states that after appt yesterday pt was increasingly SOB and went to hospital.  Per pt chart it looks like pt had thoracentesis already today.   Forwarding to TP to make aware.

## 2015-01-24 NOTE — Discharge Summary (Signed)
Physician Discharge Summary   Patient ID: [name] MRN: [id number] DOB/AGE: 1924-12-26 81 y.o.  Admit date: 01/23/2015 Discharge date: 01/24/2015  Primary Care Physician:  [name], MD  Discharge Diagnoses:   . recurrent Pleural effusion . Essential hypertension . Atrial fibrillation ([place]) . recently diagnosed non-small cell lung cancer status post cryoablation  . Acute respiratory failure (HCC)  Consults:  None   Recommendations for Outpatient Follow-up:  Patient was recommended to follow up with pulmonology, if he has repeat thoracentesis to consider Pleurx catheter versus pleurodesis. Follow-up appointment was scheduled with Mrs. Tammy Parrett on 02/04/15  TESTS THAT NEED FOLLOW-UP None   DIET: Heart healthy    Allergies:   Allergies  Allergen Reactions  . Sulfonamide Derivatives Other (See Comments)    Unk     Discharge Medications:   Medication List    TAKE these medications        ADVAIR DISKUS 250-50 MCG/DOSE Aepb  Generic drug:  Fluticasone-Salmeterol  Inhale 1 puff into the lungs 2 (two) times daily.     albuterol 108 (90 BASE) MCG/ACT inhaler  Commonly known as:  PROVENTIL HFA;VENTOLIN HFA  Inhale 1-2 puffs into the lungs every 6 (six) hours as needed for wheezing or shortness of breath.     AMBIEN 10 MG tablet  Generic drug:  zolpidem  Take 10 mg by mouth at bedtime.     CRESTOR 10 MG tablet  Generic drug:  rosuvastatin  Take 5 mg by mouth at bedtime.     finasteride 5 MG tablet  Commonly known as:  PROSCAR  Take 5 mg by mouth daily.     fluticasone 50 MCG/ACT nasal spray  Commonly known as:  FLONASE  Place 2 sprays into the nose daily.     Guaifenesin 1200 MG Tb12  Take 1,200 mg by mouth 2 (two) times daily.     montelukast 10 MG tablet  Commonly known as:  SINGULAIR  Take 10 mg by mouth at bedtime.     omeprazole 20 MG capsule  Commonly known as:  PRILOSEC  Take 20 mg by mouth every evening.      oxyCODONE-acetaminophen 5-325 MG tablet  Commonly known as:  PERCOCET/ROXICET  Take 1 tablet by mouth every 6 (six) hours as needed for severe pain.     SPIRIVA RESPIMAT 2.5 MCG/ACT Aers  Generic drug:  Tiotropium Bromide Monohydrate  Inhale 2 puffs into the lungs daily.     temazepam 15 MG capsule  Commonly known as:  RESTORIL  Take 15 mg by mouth at bedtime.     verapamil 180 MG (CO) 24 hr tablet  Commonly known as:  COVERA HS  Take 1 tablet (180 mg total) by mouth at bedtime.     XOPENEX HFA 45 MCG/ACT inhaler  Generic drug:  levalbuterol  1-2 puffs every 6 (six) hours as needed for shortness of breath.     ZYRTEC ALLERGY PO  Take 10 mg by mouth at bedtime.         Brief H and P: For complete details please refer to admission H and P, but in brief  Per Dr. Sabino Niemann admit note on 01/23/15 [name] is a 81 y.o. male with a past medical history significant for A. fib off blood thinners, HTN, COPD, GERD, and recently diagnosed NSCLC status post cryoablation this month c/b hemothorax who presents with dyspnea. This is a patient of Dr. Lake Bells, whose chronology is as follows from a previous pulmonology note: 12/11/14 -  seen in clinic by Dr. Lake Bells for lung mass 12/18/14 - navigational bronchoscopy with biopsies by Dr. Lamonte Sakai > pathology noteable for NSCLC; probably adenocarcinoma. 01/02/15 - cryoablation of left lower lobe (at [facility]). 01/03/15 - [facility] [place] and [facility]. 01/07/15 - PCCM consulted and thoracentesis done with hemorrhagic fluid Since discharge 2 weeks ago the patient has been feeling better until this week when he has had recurrence of dyspnea and discomfort in the left chest. Over the last 3 days this has gotten worse, today he saw Dr. MAnastasia PallNP who recommended thoracentesis which was being arranged as an outpatient. However, in the evening the patient was at  home alone and felt too dyspneic to stay home, and so presented to the ER. He denies fever, hemoptysis, increasing sputum, wheezing, or chest congestion/tightness.  In the ED, the patient was saturating well on ambient air, but tachypneic and tachycardic. His chest x-ray showed enlargement of his left pleural effusion, and the patient felt better with supplemental oxygen. TRH was asked to admit for observation and thoracentesis in the hospital.   Hospital Course:   Pleural effusion causing dyspnea: Likely malignant effusion, recurrent Chest x-ray showed interval increase in the volume of the pleural fluid on the left, interval decrease in the size of the left upper lobe lesion. Patient was scheduled for outpatient thoracentesis on 10/28 however he was dyspneic and symptomatic hence presented to ED. Patient underwent ultrasound-guided thoracentesis, yielding 1.9 L of fluid. Repeat chest x-ray showed no pneumothorax. Patient was recommended to continue to hold eliquis, as the fluid was bloody. Patient was strongly recommended to follow up with Dr. MLake Bellsand discuss about Pleurx catheter vs pleurodesis if patient continues to have recurrent pleural effusions. Patient was hemodynamically stable and requested to be discharged home after the thoracentesis.   COPD:  -Currently stable, no wheezing, continue home regimen of albuterol inhaler as needed.  Atrial fibrillation:  Stable. Chads 2 vas 4. Patient has been off Eliquis since his hemothorax.  -continue verapamil   HTN:  Stable. -Continue Crestor and verapamil   Day of Discharge BP 112/63 mmHg  Pulse 75  Temp(Src) 98.2 F (36.8 C) (Oral)  Resp 18  Ht '5\' 10"'$  (1.778 m)  Wt 72.3 kg (159 lb 6.3 oz)  BMI 22.87 kg/m2  SpO2 98%  Physical Exam: General: Alert and awake oriented x3 not in any acute distress. HEENT: anicteric sclera, pupils reactive to light and accommodation CVS: S1-S2 clear no murmur rubs or gallops Chest: Decreased  breath sounds at the bases Abdomen: soft nontender, nondistended, normal bowel sounds Extremities: no cyanosis, clubbing or edema noted bilaterally Neuro: Cranial nerves II-XII intact, no focal neurological deficits   The results of significant diagnostics from this hospitalization (including imaging, microbiology, ancillary and laboratory) are listed below for reference.    LAB RESULTS: Basic Metabolic Panel:  Recent Labs Lab 01/23/15 2037 01/24/15 0419  NA 131* 136  K 4.2 4.2  CL 95* 97*  CO2 27 28  GLUCOSE 117* 87  BUN 23* 18  CREATININE 0.91 0.79  CALCIUM 8.9 9.2   Liver Function Tests: No results for input(s): AST, ALT, ALKPHOS, BILITOT, PROT, ALBUMIN in the last 168 hours. No results for input(s): LIPASE, AMYLASE in the last 168 hours. No results for input(s): AMMONIA in the last 168 hours. CBC:  Recent Labs Lab 01/23/15 2037 01/24/15 0419  WBC 10.1 9.1  HGB 11.0* 10.3*  HCT 34.3* 32.0*  MCV 92.5 92.0  PLT 248 222   Cardiac Enzymes: No results for input(s): CKTOTAL, CKMB, CKMBINDEX, TROPONINI in the last 168 hours. BNP: Invalid input(s): POCBNP CBG: No results for input(s): GLUCAP in the last 168 hours.  Significant Diagnostic Studies:  Dg Chest 1 View  01/24/2015  CLINICAL DATA:  Status post left thoracentesis. EXAM: CHEST 1 VIEW COMPARISON:  January 23, 2015. FINDINGS: Stable cardiomediastinal silhouette. Left perihilar opacity is unchanged compared to prior exam concerning for neoplasm. No pneumothorax is noted. Stable small left pleural effusion is noted with probable associated subsegmental atelectasis. Stable opacity is noted in right upper lobe consistent with scarring or subsegmental atelectasis. Bony thorax is unremarkable. IMPRESSION: No pneumothorax is noted status post left-sided thoracentesis. Stable mild left pleural effusion with associated subsegmental atelectasis is noted. Stable left perihilar lesion is noted. Electronically Signed   By: Marijo Conception, M.D.   On: 01/24/2015 09:57   Dg Chest 2 View  01/23/2015  CLINICAL DATA:  Progressive or worsening of shortness of breath over the past week; the patient underwent lung tumor ablation 3 weeks ago, history of COPD. EXAM: CHEST  2 VIEW COMPARISON:  PA and lateral chest x-ray of January 09, 2015 FINDINGS: The mass in the superior segment of the left lower lobe has decreased in size. However, there has been mild interval increase in the left pleural effusion since the previous study. The right lung exhibits no acute abnormality. The interstitial markings of both lungs are mildly prominent though stable. The heart is top-normal in size. There is no pulmonary vascular congestion. The bony thorax exhibits no acute abnormality. IMPRESSION: Interval decrease in the size of the left upper lobe lesion but mild interval increase in the volume of pleural fluid on the left. There is no pneumothorax nor mediastinal shift. There is stable changes of COPD. Electronically Signed   By: David  Martinique M.D.   On: 01/23/2015 15:48   US Thoracentesis Asp Pleural Space W/img Guide  01/24/2015  INDICATION: Symptomatic left sided pleural effusion EXAM: US THORACENTESIS ASP PLEURAL SPACE W/IMG GUIDE COMPARISON:  CXR 01/23/2015. MEDICATIONS: None COMPLICATIONS: None immediate TECHNIQUE: Informed written consent was obtained from the patient after a discussion of the risks, benefits and alternatives to treatment. A timeout was performed prior to the initiation of the procedure. Initial ultrasound scanning demonstrates a left pleural effusion. The lower chest was prepped and draped in the usual sterile fashion. 1% lidocaine was used for local anesthesia. An ultrasound image was saved for documentation purposes. A 6 Fr Safe-T-Centesis catheter was introduced. The thoracentesis was performed. The catheter was removed and a dressing was applied. The patient tolerated the procedure well without immediate post procedural  complication. The patient was escorted to have an upright chest radiograph. FINDINGS: A total of approximately 1.9 liters of bloody fluid was removed. Requested samples were sent to the laboratory. IMPRESSION: Successful ultrasound-guided left sided thoracentesis yielding 1.9 liters of pleural fluid. Read By:  Tsosie Billing PA-C Electronically Signed   By: Aletta Edouard M.D.   On: 01/24/2015 09:54    2D ECHO:   Disposition and Follow-up:     Discharge Instructions    Diet - low sodium heart healthy    Complete by:  As directed      Increase activity slowly    Complete by:  As directed             DISPOSITION: home    DISCHARGE FOLLOW-UP Follow-up Information  Follow up with PARRETT,TAMMY, NP On 02/04/2015.   Specialty:  Pulmonary Disease   Why:  at 11:15 AM , for hospital follow-up regarding pleural effusion, eliquis   Contact information:   [address] [address] [phone]       Follow up with [name], MD. Schedule an appointment as soon as possible for a visit in 2 weeks.   Specialty:  Internal Medicine   Why:  for hospital follow-up; APPOINTMENT: Per Lattie Haw @ office; Patient to call Office on Monday, 01-27-15 & speak directly with Dyann Kief information:   [address] [place] [address] [phone]        Time spent on Discharge: 25 minutes  Signed:   Robertine Kipper M.D. [facility] 01/24/2015, 12:49 PM Pager: [phone]

## 2015-01-24 NOTE — Assessment & Plan Note (Addendum)
Recent flare now resolving , suspect most of dyspnea is secondary to left effusion  Cont on current regimen .

## 2015-01-24 NOTE — Progress Notes (Signed)
New Admission Note: Pt admitted from the [facility] ED via stretcher to room 6E30  Arrival Method: Stretcher Mental Orientation: Alert and Oriented x 4 Telemetry: Non-Applicable Assessment: To be Completed Skin: Intact IV: L FA Saline locked Pain: Denies Tubes: None Safety Measures: Safety Fall Prevention Plan has been given and discussed Admission: To be completed 6 [place] Orientation: Patient has been orientated to the room, unit and staff.  Family: None present at this time  Orders have been reviewed and implemented. Will continue to monitor the patient. Call light has been placed within reach and bed alarm has been activated.   Mady Gemma, RN-BC Phone: [phone]

## 2015-01-24 NOTE — Procedures (Signed)
Successful US guided left thoracentesis. Yielded 1.9 liters of bloody fluid. Pt tolerated procedure well. No immediate complications.  Specimen was sent for labs. CXR ordered.  Tsosie Billing D PA-C 01/24/2015 9:05 AM

## 2015-01-24 NOTE — Progress Notes (Signed)
Patient's O2 sat while ambulating without O2 98%.

## 2015-01-24 NOTE — Progress Notes (Signed)
Patient Discharge: Disposition: Patient discharged to home. Education: Reviewed medications, follow-up appointments, and discharge instructions with patient and daughter.   IV: Discontinued IV before discharge. Telemetry: N/A Transportation: Patient transported in w/c with staff and daughter accompanying with him out of the unit. Belongings: Patient took all his belongings with him.

## 2015-01-24 NOTE — Assessment & Plan Note (Addendum)
Recurrent Left pleural effusion w/ underlying lung cancer s/p cryoablation w/ subsequent hemothorax.  He has underwent thoracentesis x 2 . Cytology reviewed from [facility] admission w/ no malignant cells Eliquis is on hold.  Will set up for therapeutic thoracentesis on left .  IR is unable to perform until 01/27/15, will try to set up on PCCM schedule tomorrow.  Case reviewed with Dr. Lake Bells.    Plan  We are setting you up for a therapeutic thoracentesis on left for pleural effusion .  Follow up  With Dr. Lake Bells in 2-3 weeks and As needed   Please contact office for sooner follow up if symptoms do not improve or worsen or seek emergency care

## 2015-01-27 ENCOUNTER — Ambulatory Visit (HOSPITAL_COMMUNITY): Payer: Medicare Other

## 2015-01-27 NOTE — Progress Notes (Signed)
Reviewed, I agree with this plan of care 

## 2015-01-28 LAB — PATHOLOGIST SMEAR REVIEW

## 2015-01-29 LAB — CULTURE, BODY FLUID W GRAM STAIN -BOTTLE: Culture: NO GROWTH

## 2015-01-29 LAB — CULTURE, BODY FLUID-BOTTLE

## 2015-01-30 DIAGNOSIS — I480 Paroxysmal atrial fibrillation: Secondary | ICD-10-CM | POA: Diagnosis not present

## 2015-01-30 DIAGNOSIS — C3412 Malignant neoplasm of upper lobe, left bronchus or lung: Secondary | ICD-10-CM | POA: Diagnosis not present

## 2015-01-30 DIAGNOSIS — J90 Pleural effusion, not elsewhere classified: Secondary | ICD-10-CM | POA: Diagnosis not present

## 2015-01-30 DIAGNOSIS — D649 Anemia, unspecified: Secondary | ICD-10-CM | POA: Diagnosis not present

## 2015-02-03 LAB — FUNGUS CULTURE W SMEAR: Fungal Smear: NONE SEEN

## 2015-02-04 ENCOUNTER — Ambulatory Visit (INDEPENDENT_AMBULATORY_CARE_PROVIDER_SITE_OTHER)
Admission: RE | Admit: 2015-02-04 | Discharge: 2015-02-04 | Disposition: A | Discharge status: Home / Self Care | Payer: Medicare Other | Source: Ambulatory Visit | Attending: Adult Health | Admitting: Adult Health

## 2015-02-04 ENCOUNTER — Encounter: Payer: Self-pay | Admitting: Adult Health

## 2015-02-04 ENCOUNTER — Ambulatory Visit (INDEPENDENT_AMBULATORY_CARE_PROVIDER_SITE_OTHER): Payer: Medicare Other | Admitting: Adult Health

## 2015-02-04 VITALS — BP 136/60 | HR 66 | Temp 98.3°F | Ht 70.0 in | Wt 163.0 lb

## 2015-02-04 DIAGNOSIS — Z6823 Body mass index (BMI) 23.0-23.9, adult: Secondary | ICD-10-CM | POA: Diagnosis not present

## 2015-02-04 DIAGNOSIS — I480 Paroxysmal atrial fibrillation: Secondary | ICD-10-CM | POA: Diagnosis not present

## 2015-02-04 DIAGNOSIS — C3412 Malignant neoplasm of upper lobe, left bronchus or lung: Secondary | ICD-10-CM | POA: Diagnosis not present

## 2015-02-04 DIAGNOSIS — J90 Pleural effusion, not elsewhere classified: Secondary | ICD-10-CM | POA: Diagnosis not present

## 2015-02-04 DIAGNOSIS — D649 Anemia, unspecified: Secondary | ICD-10-CM | POA: Diagnosis not present

## 2015-02-04 DIAGNOSIS — R918 Other nonspecific abnormal finding of lung field: Secondary | ICD-10-CM

## 2015-02-04 DIAGNOSIS — J942 Hemothorax: Secondary | ICD-10-CM | POA: Diagnosis not present

## 2015-02-04 DIAGNOSIS — R042 Hemoptysis: Secondary | ICD-10-CM | POA: Diagnosis not present

## 2015-02-04 NOTE — Progress Notes (Signed)
Reviewed, I agree with this plan of care Difficult situation.  If the effusion recurs he will need to see [facility] radiology again where the cryoablation was performed.

## 2015-02-04 NOTE — Progress Notes (Signed)
Subjective:    Patient ID: DENO SIDA, male    DOB: 12-15-1924, 81 y.o.   MRN: [id number]  HPI  81 year old male former smoker , recently diagnosed with non-small cell lung carcinoma status post cryoablation of the left lower lobe at [facility] on 01/02/2015  Hx of Loefflers Syndome    02/04/2015 Post hospital follow up : Lung cancer, pleural effusion , Asthma/Loefflers syndrome.  Patient returns for a post hospital follow-up visit. Patient was recently diagnosed with non-small cell lung carcinoma status post cryoablation of the left lower lobe nodule  at [facility] on 01/02/2015 Unfortunately, this was complicated by a left-sided hemothorax requiring 960 cc of fluid be removed via  thoracentesis. He was  on Eliquis for chronic A. Fib -this is currently on hold.  Patient was discharged from [facility] and unfortunately was readmitted to [facility] on October 9 th the night for worsening shortness of breath and hemoptysis. CT chest showed distant left lower lobe nodule. He had bilateral pleural effusions, left than right.. Patient underwent a thoracentesis with 700 cc of bloody fluid removed. Cytology was neg malignant cells. Symptoms improved and he was discharged 01/09/15  He was seen back at at [facility] 01/14/15 , w/ reported stable cxr.  Seen back in office with reaccumulation of left effusion.  He was set up for thoracentesis but worsened overnight and was admitted .  Thoracentesis was done 10/28 with 1.9 L of fluid removed. Path showed no atypia.  He is feeling better with less dyspnea.  CXR today shows very small residual left pleural effusion .  He denies hemoptysis , chest pain,orthopnea , edema or fever.   Review of Systems   Constitutional:   No  weight loss, night sweats,  Fevers, chills,  +fatigue, or  lassitude.  HEENT:   No headaches,  Difficulty swallowing,  Tooth/dental problems, or  Sore throat,                No sneezing, itching, ear ache, nasal  congestion, post nasal drip,   CV:  No chest pain,  Orthopnea, PND, swelling in lower extremities, anasarca, dizziness, palpitations, syncope.   GI  No heartburn, indigestion, abdominal pain, nausea, vomiting, diarrhea, change in bowel habits, loss of appetite, bloody stools.   Resp:.  No chest wall deformity  Skin: no rash or lesions.  GU: no dysuria, change in color of urine, no urgency or frequency.  No flank pain, no hematuria   MS:  No joint pain or swelling.  No decreased range of motion.  No back pain.  Psych:  No change in mood or affect. No depression or anxiety.  No memory loss.          Objective:   Physical Exam   GEN: A/Ox3; pleasant , NAD, elderly   HEENT:  [place]/AT,  EACs-clear, TMs-wnl, NOSE-clear, THROAT-clear, no lesions, no postnasal drip or exudate noted.   NECK:  Supple w/ fair ROM; no JVD; normal carotid impulses w/o bruits; no thyromegaly or nodules palpated; no lymphadenopathy.  RESP  CTA , slightly decreased BS in left , .no accessory muscle use, no dullness to percussion  CARD:  RRR, no m/r/g  , no peripheral edema, pulses intact, no cyanosis or clubbing.  GI:   Soft & nt; nml bowel sounds; no organomegaly or masses detected.  Musco: Warm bil, no deformities or joint swelling noted.   Neuro: alert, no focal deficits noted.    Skin: Warm, no  lesions or rashes  CXR 02/04/2015 stable LLL opacity , small left pleural effusion , decreased  Independently reviewed       Assessment & Plan:

## 2015-02-04 NOTE — Assessment & Plan Note (Signed)
Lung cancer w/ NSCLC s/p cryoablation done at [facility]  Pt says he is suppose to have a PET scan in January . , will discuss with Dr. Lake Bells.

## 2015-02-04 NOTE — Patient Instructions (Signed)
Continue on current regimen  Follow up Dr Lake Bells in 2 months and As needed

## 2015-02-04 NOTE — Assessment & Plan Note (Signed)
Recurrent left pleural effusion s/p cryoablation for lung cancer  Pt is improved , no reoccurence of effusion today on cxr   Plan  follow up with in 2 months

## 2015-02-05 DIAGNOSIS — Z85828 Personal history of other malignant neoplasm of skin: Secondary | ICD-10-CM | POA: Diagnosis not present

## 2015-02-05 DIAGNOSIS — D0439 Carcinoma in situ of skin of other parts of face: Secondary | ICD-10-CM | POA: Diagnosis not present

## 2015-02-10 ENCOUNTER — Telehealth: Payer: Self-pay | Admitting: Adult Health

## 2015-02-10 DIAGNOSIS — R5383 Other fatigue: Secondary | ICD-10-CM | POA: Diagnosis not present

## 2015-02-10 DIAGNOSIS — J90 Pleural effusion, not elsewhere classified: Secondary | ICD-10-CM | POA: Diagnosis not present

## 2015-02-10 DIAGNOSIS — R8290 Unspecified abnormal findings in urine: Secondary | ICD-10-CM | POA: Diagnosis not present

## 2015-02-10 DIAGNOSIS — M810 Age-related osteoporosis without current pathological fracture: Secondary | ICD-10-CM | POA: Diagnosis not present

## 2015-02-10 DIAGNOSIS — Z6823 Body mass index (BMI) 23.0-23.9, adult: Secondary | ICD-10-CM | POA: Diagnosis not present

## 2015-02-10 DIAGNOSIS — C3412 Malignant neoplasm of upper lobe, left bronchus or lung: Secondary | ICD-10-CM | POA: Diagnosis not present

## 2015-02-10 NOTE — Telephone Encounter (Signed)
Spoke with patient - states that he is returning TP's phone call.  Please advise TP if you tried to contact his patient -- I do not see a message in EPIC where a nurse attempted to call. Thanks.

## 2015-02-10 NOTE — Telephone Encounter (Signed)
lmtcb for pt.  

## 2015-02-12 NOTE — Telephone Encounter (Signed)
Tammy, did you try to contact this patient?  Please advise.

## 2015-02-13 NOTE — Telephone Encounter (Signed)
Spoke with pt , he will discuss with .Dr. Lake Bells on return in January regarding additonal testing including PET scan .  Pt is okay with this. And is feeling better.

## 2015-02-19 LAB — AFB CULTURE WITH SMEAR (NOT AT ARMC)
Acid Fast Smear: NONE SEEN
SPECIAL REQUESTS: NORMAL

## 2015-02-21 ENCOUNTER — Other Ambulatory Visit (HOSPITAL_COMMUNITY): Payer: Self-pay

## 2015-02-24 ENCOUNTER — Ambulatory Visit (HOSPITAL_COMMUNITY)
Admission: RE | Admit: 2015-02-24 | Discharge: 2015-02-24 | Disposition: A | Discharge status: Home / Self Care | Payer: Medicare Other | Source: Ambulatory Visit | Attending: Internal Medicine | Admitting: Internal Medicine

## 2015-02-24 DIAGNOSIS — E2740 Unspecified adrenocortical insufficiency: Secondary | ICD-10-CM | POA: Insufficient documentation

## 2015-02-24 DIAGNOSIS — R5383 Other fatigue: Secondary | ICD-10-CM | POA: Insufficient documentation

## 2015-02-24 LAB — ACTH STIMULATION, 3 TIME POINTS
Cortisol, 30 Min: 17.5 ug/dL
Cortisol, 60 Min: 18.2 ug/dL
Cortisol, Base: 12.1 ug/dL

## 2015-02-24 MED ORDER — COSYNTROPIN 0.25 MG IJ SOLR
0.2500 mg | Freq: Once | INTRAMUSCULAR | Status: AC
  Administered 2015-02-24: 0.25 mg via INTRAVENOUS
  Filled 2015-02-24: qty 0.25

## 2015-02-25 LAB — ACTH: C206 ACTH: 39.4 pg/mL (ref 7.2–63.3)

## 2015-03-10 ENCOUNTER — Other Ambulatory Visit (HOSPITAL_COMMUNITY): Payer: Self-pay | Admitting: Internal Medicine

## 2015-03-10 DIAGNOSIS — E2740 Unspecified adrenocortical insufficiency: Secondary | ICD-10-CM | POA: Diagnosis not present

## 2015-03-10 DIAGNOSIS — C3412 Malignant neoplasm of upper lobe, left bronchus or lung: Secondary | ICD-10-CM

## 2015-03-10 DIAGNOSIS — I480 Paroxysmal atrial fibrillation: Secondary | ICD-10-CM | POA: Diagnosis not present

## 2015-03-10 DIAGNOSIS — R140 Abdominal distension (gaseous): Secondary | ICD-10-CM | POA: Diagnosis not present

## 2015-03-10 DIAGNOSIS — Z6824 Body mass index (BMI) 24.0-24.9, adult: Secondary | ICD-10-CM | POA: Diagnosis not present

## 2015-03-13 DIAGNOSIS — H2512 Age-related nuclear cataract, left eye: Secondary | ICD-10-CM | POA: Diagnosis not present

## 2015-03-13 DIAGNOSIS — H40013 Open angle with borderline findings, low risk, bilateral: Secondary | ICD-10-CM | POA: Diagnosis not present

## 2015-03-13 DIAGNOSIS — H25012 Cortical age-related cataract, left eye: Secondary | ICD-10-CM | POA: Diagnosis not present

## 2015-03-13 DIAGNOSIS — H25042 Posterior subcapsular polar age-related cataract, left eye: Secondary | ICD-10-CM | POA: Diagnosis not present

## 2015-04-03 DIAGNOSIS — M810 Age-related osteoporosis without current pathological fracture: Secondary | ICD-10-CM | POA: Diagnosis not present

## 2015-04-03 DIAGNOSIS — Z6824 Body mass index (BMI) 24.0-24.9, adult: Secondary | ICD-10-CM | POA: Diagnosis not present

## 2015-04-09 DIAGNOSIS — T1490 Injury, unspecified: Secondary | ICD-10-CM | POA: Diagnosis not present

## 2015-04-09 DIAGNOSIS — T148 Other injury of unspecified body region: Secondary | ICD-10-CM | POA: Diagnosis not present

## 2015-04-09 DIAGNOSIS — R609 Edema, unspecified: Secondary | ICD-10-CM | POA: Diagnosis not present

## 2015-04-09 DIAGNOSIS — W19XXXA Unspecified fall, initial encounter: Secondary | ICD-10-CM | POA: Diagnosis not present

## 2015-04-09 DIAGNOSIS — R600 Localized edema: Secondary | ICD-10-CM | POA: Diagnosis not present

## 2015-04-09 DIAGNOSIS — Z6825 Body mass index (BMI) 25.0-25.9, adult: Secondary | ICD-10-CM | POA: Diagnosis not present

## 2015-04-14 ENCOUNTER — Ambulatory Visit (INDEPENDENT_AMBULATORY_CARE_PROVIDER_SITE_OTHER): Payer: Medicare Other | Admitting: Pulmonary Disease

## 2015-04-14 ENCOUNTER — Encounter: Payer: Self-pay | Admitting: Pulmonary Disease

## 2015-04-14 VITALS — BP 126/66 | HR 83 | Ht 70.0 in | Wt 186.0 lb

## 2015-04-14 DIAGNOSIS — J90 Pleural effusion, not elsewhere classified: Secondary | ICD-10-CM | POA: Diagnosis not present

## 2015-04-14 DIAGNOSIS — C3492 Malignant neoplasm of unspecified part of left bronchus or lung: Secondary | ICD-10-CM | POA: Diagnosis not present

## 2015-04-14 NOTE — Patient Instructions (Signed)
Please let us know once you are aware of the results of the PET scan We will see you back in 6 months or sooner if needed

## 2015-04-14 NOTE — Assessment & Plan Note (Signed)
[name] this was diagnosed with adenocarcinoma of the lung in 2016 and he has been very particular about his treatment regimens since then. Despite our recommendations he has not wanted to see medical oncology and he chose to have treated with cryoablation at [facility] this year. Unfortunately this was complicated by a hemothorax requiring 3 thoracenteses procedure afterwards. It seems that the pleural effusion has resolved. He is now considering whether or not he would want to have "non-chemotherapy" medical therapy for this, but I do not see where he has genetic testing on file. I have once again encouraged him to consider seeing medical oncology to discuss this. Apparently he has a PET scan ordered for tomorrow, I've asked him to call me once he knows the results so that I can help answer any questions he may have about this.

## 2015-04-14 NOTE — Assessment & Plan Note (Signed)
Resolve don most recent CXR, will f/u this week's PET CT.

## 2015-04-14 NOTE — Progress Notes (Signed)
Subjective:    Patient ID: [name], male    DOB: 1924/09/07, 82 y.o.   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 He refused any sort of systemic chemotherapy and chose to have cryoablation at [facility]. Unfortunately this resulted in significant pleural effusions. 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 Brigitte [name].  HPI Chief Complaint  Patient presents with  . Follow-up    pt c/o fatigue, occasional wheezing.  Pt describes himself generally at baseline.    [name] says he has been doing well.  He has been gaining weight lately which he says that he thinks is related to a recent addition of hydrocortisone to his medication regimen.  He is going to have a PET CT scan at [facility] tomorrow.  He still hasn't seen a medical oncologist and isn't interested in seeing one.   He has occassional cough and wheeze consistent with his known asthma/COPD.   Past Medical History  Diagnosis Date  . Bradycardia   . Palpitations   . Asthma   . Hemorrhoids   . Hx of adenomatous colonic polyps 02/1997  . Atrial fibrillation ([place])   . Hypertension   . Diverticulosis of colon   . COPD (chronic obstructive pulmonary disease) (HCC)     mild  . Bowel obstruction ([place])   . Arthritis     osteoarthritis  . Cancer ([place])     skin cancer  . 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[place])       Review of Systems     Objective:   Physical Exam Filed Vitals:   04/14/15 1047  BP: 126/66  [name]: 85  Height: '5\' 10"'$  (1.778 m)  Weight: 186 lb (84.369 kg)  SpO2: 95%    RA  Gen: chronically ill appearing HENT: mild bruise under R eye and over bridge of nose, OP clear, TM's clear, neck supple PULM: Few crackles bases, normal percussion CV: RRR, no mgr, trace edema GI: BS+, soft, nontender Derm: no cyanosis or rash Psyche: normal mood and affect  November  2016 chest x-ray images personally reviewed showing the left upper lobe mass is stable and a very small pleural effusion in the left     Assessment & Plan:  Adenocarcinoma, lung [facility]) [name] this was diagnosed with adenocarcinoma of the lung in 2016 and he has been very particular about his treatment regimens since then. Despite our recommendations he has not wanted to see medical oncology and he chose to have treated with cryoablation at [facility] this year. Unfortunately this was complicated by a hemothorax requiring 3 thoracenteses procedure afterwards. It seems that the pleural effusion has resolved. He is now considering whether or not he would want to have "non-chemotherapy" medical therapy for this, but I do not see where he has genetic testing on file. I have once again encouraged him to consider seeing medical oncology to discuss this. Apparently he has a PET scan ordered for tomorrow, I've asked him to call me once he knows the results so that I can help answer any questions he may have about this.  Pleural effusion Resolve don most recent CXR, will f/u this week's PET CT.     Current outpatient prescriptions:  .  albuterol (PROVENTIL HFA;VENTOLIN HFA) 108 (90 BASE) MCG/ACT inhaler, Inhale 1-2 puffs into the lungs every 6 (six) hours as needed for wheezing or shortness  of breath., Disp: , Rfl:  .  Cetirizine HCl (ZYRTEC ALLERGY PO), Take 10 mg by mouth at bedtime. , Disp: , Rfl:  .  CRESTOR 10 MG tablet, Take 5 mg by mouth at bedtime. , Disp: , Rfl:  .  finasteride (PROSCAR) 5 MG tablet, Take 5 mg by mouth daily.  , Disp: , Rfl:  .  fluticasone (FLONASE) 50 MCG/ACT nasal spray, Place 2 sprays into the nose daily.  , Disp: , Rfl:  .  Fluticasone-Salmeterol (ADVAIR DISKUS) 250-50 MCG/DOSE AEPB, Inhale 1 puff into the lungs 2 (two) times daily.  , Disp: , Rfl:  .  Guaifenesin 1200 MG TB12, Take 1,200 mg by mouth daily as needed. , Disp: , Rfl:  .  montelukast (SINGULAIR) 10 MG tablet, Take 10 mg  by mouth at bedtime.  , Disp: , Rfl:  .  temazepam (RESTORIL) 15 MG capsule, Take 15 mg by mouth at bedtime. , Disp: , Rfl:  .  Tiotropium Bromide Monohydrate (SPIRIVA RESPIMAT) 2.5 MCG/ACT AERS, Inhale 2 puffs into the lungs daily., Disp: , Rfl:  .  verapamil (COVERA HS) 180 MG (CO) 24 hr tablet, Take 1 tablet (180 mg total) by mouth at bedtime. (Patient taking differently: Take 180 mg by mouth every evening. ), Disp: 30 tablet, Rfl: 5 .  XOPENEX HFA 45 MCG/ACT inhaler, 1-2 puffs every 6 (six) hours as needed for shortness of breath. , Disp: , Rfl:  .  zolpidem (AMBIEN) 10 MG tablet, Take 10 mg by mouth at bedtime. , Disp: , Rfl:  .  [DISCONTINUED] beclomethasone (QVAR) 80 MCG/ACT inhaler, Inhale 2 puffs into the lungs 2 (two) times daily., Disp: , Rfl:

## 2015-04-15 ENCOUNTER — Ambulatory Visit (HOSPITAL_COMMUNITY)
Admission: RE | Admit: 2015-04-15 | Discharge: 2015-04-15 | Disposition: A | Discharge status: Home / Self Care | Payer: Medicare Other | Source: Ambulatory Visit | Attending: Internal Medicine | Admitting: Internal Medicine

## 2015-04-15 ENCOUNTER — Other Ambulatory Visit (HOSPITAL_COMMUNITY): Payer: Self-pay | Admitting: Internal Medicine

## 2015-04-15 DIAGNOSIS — I517 Cardiomegaly: Secondary | ICD-10-CM | POA: Diagnosis not present

## 2015-04-15 DIAGNOSIS — J479 Bronchiectasis, uncomplicated: Secondary | ICD-10-CM | POA: Diagnosis not present

## 2015-04-15 DIAGNOSIS — I700 Atherosclerosis of aorta: Secondary | ICD-10-CM | POA: Diagnosis not present

## 2015-04-15 DIAGNOSIS — J90 Pleural effusion, not elsewhere classified: Secondary | ICD-10-CM | POA: Diagnosis not present

## 2015-04-15 DIAGNOSIS — D71 Functional disorders of polymorphonuclear neutrophils: Secondary | ICD-10-CM | POA: Insufficient documentation

## 2015-04-15 DIAGNOSIS — K5730 Diverticulosis of large intestine without perforation or abscess without bleeding: Secondary | ICD-10-CM | POA: Diagnosis not present

## 2015-04-15 DIAGNOSIS — C3412 Malignant neoplasm of upper lobe, left bronchus or lung: Secondary | ICD-10-CM | POA: Diagnosis not present

## 2015-04-15 DIAGNOSIS — R918 Other nonspecific abnormal finding of lung field: Secondary | ICD-10-CM | POA: Insufficient documentation

## 2015-04-15 DIAGNOSIS — I2510 Atherosclerotic heart disease of native coronary artery without angina pectoris: Secondary | ICD-10-CM | POA: Insufficient documentation

## 2015-04-15 LAB — GLUCOSE, CAPILLARY: GLUCOSE-CAPILLARY: 121 mg/dL — AB (ref 65–99)

## 2015-04-15 MED ORDER — FLUDEOXYGLUCOSE F - 18 (FDG) INJECTION
10.0000 | Freq: Once | INTRAVENOUS | Status: AC | PRN
  Administered 2015-04-15: 10 via INTRAVENOUS

## 2015-04-16 DIAGNOSIS — J449 Chronic obstructive pulmonary disease, unspecified: Secondary | ICD-10-CM | POA: Diagnosis not present

## 2015-04-16 DIAGNOSIS — I10 Essential (primary) hypertension: Secondary | ICD-10-CM | POA: Diagnosis not present

## 2015-04-16 DIAGNOSIS — M8008XD Age-related osteoporosis with current pathological fracture, vertebra(e), subsequent encounter for fracture with routine healing: Secondary | ICD-10-CM | POA: Diagnosis not present

## 2015-04-16 DIAGNOSIS — R2689 Other abnormalities of gait and mobility: Secondary | ICD-10-CM | POA: Diagnosis not present

## 2015-04-17 ENCOUNTER — Telehealth: Payer: Self-pay | Admitting: Internal Medicine

## 2015-04-17 NOTE — Telephone Encounter (Signed)
Called pt left vm in ref to new pt appt. On 05/07/15'@1'$ :46 Referring Dr. Brigitte Pulse DX-Lung ca

## 2015-04-18 DIAGNOSIS — J3089 Other allergic rhinitis: Secondary | ICD-10-CM | POA: Diagnosis not present

## 2015-04-18 DIAGNOSIS — J301 Allergic rhinitis due to pollen: Secondary | ICD-10-CM | POA: Diagnosis not present

## 2015-04-18 DIAGNOSIS — J4530 Mild persistent asthma, uncomplicated: Secondary | ICD-10-CM | POA: Diagnosis not present

## 2015-04-22 ENCOUNTER — Ambulatory Visit (HOSPITAL_COMMUNITY)
Admission: RE | Admit: 2015-04-22 | Discharge: 2015-04-22 | Disposition: A | Discharge status: Home / Self Care | Payer: Medicare Other | Source: Ambulatory Visit | Attending: Vascular Surgery | Admitting: Vascular Surgery

## 2015-04-22 ENCOUNTER — Other Ambulatory Visit (HOSPITAL_COMMUNITY): Payer: Self-pay | Admitting: Internal Medicine

## 2015-04-22 DIAGNOSIS — E2740 Unspecified adrenocortical insufficiency: Secondary | ICD-10-CM | POA: Diagnosis not present

## 2015-04-22 DIAGNOSIS — Z1389 Encounter for screening for other disorder: Secondary | ICD-10-CM | POA: Diagnosis not present

## 2015-04-22 DIAGNOSIS — C3412 Malignant neoplasm of upper lobe, left bronchus or lung: Secondary | ICD-10-CM | POA: Diagnosis not present

## 2015-04-22 DIAGNOSIS — I82403 Acute embolism and thrombosis of unspecified deep veins of lower extremity, bilateral: Secondary | ICD-10-CM | POA: Diagnosis not present

## 2015-04-22 DIAGNOSIS — R600 Localized edema: Secondary | ICD-10-CM | POA: Diagnosis not present

## 2015-04-22 DIAGNOSIS — I82443 Acute embolism and thrombosis of tibial vein, bilateral: Secondary | ICD-10-CM | POA: Diagnosis not present

## 2015-04-22 DIAGNOSIS — I10 Essential (primary) hypertension: Secondary | ICD-10-CM | POA: Insufficient documentation

## 2015-04-22 DIAGNOSIS — R609 Edema, unspecified: Secondary | ICD-10-CM | POA: Diagnosis not present

## 2015-04-22 DIAGNOSIS — D491 Neoplasm of unspecified behavior of respiratory system: Secondary | ICD-10-CM | POA: Diagnosis not present

## 2015-04-22 DIAGNOSIS — Z6825 Body mass index (BMI) 25.0-25.9, adult: Secondary | ICD-10-CM | POA: Diagnosis not present

## 2015-04-28 DIAGNOSIS — J3089 Other allergic rhinitis: Secondary | ICD-10-CM | POA: Diagnosis not present

## 2015-04-28 DIAGNOSIS — J301 Allergic rhinitis due to pollen: Secondary | ICD-10-CM | POA: Diagnosis not present

## 2015-05-06 ENCOUNTER — Other Ambulatory Visit: Payer: Self-pay | Admitting: *Deleted

## 2015-05-06 DIAGNOSIS — C3492 Malignant neoplasm of unspecified part of left bronchus or lung: Secondary | ICD-10-CM | POA: Diagnosis not present

## 2015-05-06 DIAGNOSIS — C3490 Malignant neoplasm of unspecified part of unspecified bronchus or lung: Secondary | ICD-10-CM

## 2015-05-07 ENCOUNTER — Other Ambulatory Visit (HOSPITAL_BASED_OUTPATIENT_CLINIC_OR_DEPARTMENT_OTHER): Payer: Medicare Other

## 2015-05-07 ENCOUNTER — Encounter: Payer: Self-pay | Admitting: Internal Medicine

## 2015-05-07 ENCOUNTER — Ambulatory Visit (HOSPITAL_BASED_OUTPATIENT_CLINIC_OR_DEPARTMENT_OTHER): Payer: Medicare Other | Admitting: Internal Medicine

## 2015-05-07 ENCOUNTER — Telehealth: Payer: Self-pay | Admitting: Internal Medicine

## 2015-05-07 VITALS — BP 141/80 | HR 89 | Temp 97.8°F | Resp 17 | Ht 70.0 in | Wt 174.1 lb

## 2015-05-07 DIAGNOSIS — C3432 Malignant neoplasm of lower lobe, left bronchus or lung: Secondary | ICD-10-CM | POA: Diagnosis not present

## 2015-05-07 DIAGNOSIS — C3490 Malignant neoplasm of unspecified part of unspecified bronchus or lung: Secondary | ICD-10-CM

## 2015-05-07 DIAGNOSIS — C3491 Malignant neoplasm of unspecified part of right bronchus or lung: Secondary | ICD-10-CM

## 2015-05-07 LAB — CBC WITH DIFFERENTIAL/PLATELET
BASO%: 0.4 % (ref 0.0–2.0)
BASOS ABS: 0 10*3/uL (ref 0.0–0.1)
EOS%: 0.1 % (ref 0.0–7.0)
Eosinophils Absolute: 0 10*3/uL (ref 0.0–0.5)
HCT: 34.9 % — ABNORMAL LOW (ref 38.4–49.9)
HGB: 11.1 g/dL — ABNORMAL LOW (ref 13.0–17.1)
LYMPH%: 4.7 % — AB (ref 14.0–49.0)
MCH: 27.8 pg (ref 27.2–33.4)
MCHC: 31.8 g/dL — ABNORMAL LOW (ref 32.0–36.0)
MCV: 87.4 fL (ref 79.3–98.0)
MONO#: 0.3 10*3/uL (ref 0.1–0.9)
MONO%: 4.7 % (ref 0.0–14.0)
NEUT#: 6.4 10*3/uL (ref 1.5–6.5)
NEUT%: 90.1 % — ABNORMAL HIGH (ref 39.0–75.0)
Platelets: 178 10*3/uL (ref 140–400)
RBC: 3.99 10*6/uL — AB (ref 4.20–5.82)
RDW: 21.2 % — ABNORMAL HIGH (ref 11.0–14.6)
WBC: 7.1 10*3/uL (ref 4.0–10.3)
lymph#: 0.3 10*3/uL — ABNORMAL LOW (ref 0.9–3.3)

## 2015-05-07 LAB — COMPREHENSIVE METABOLIC PANEL
ALK PHOS: 71 U/L (ref 40–150)
ALT: 19 U/L (ref 0–55)
AST: 28 U/L (ref 5–34)
Albumin: 3.4 g/dL — ABNORMAL LOW (ref 3.5–5.0)
Anion Gap: 11 mEq/L (ref 3–11)
BUN: 15.3 mg/dL (ref 7.0–26.0)
CHLORIDE: 101 meq/L (ref 98–109)
CO2: 25 meq/L (ref 22–29)
Calcium: 9.2 mg/dL (ref 8.4–10.4)
Creatinine: 1 mg/dL (ref 0.7–1.3)
EGFR: 71 mL/min/{1.73_m2} — AB (ref >90)
GLUCOSE: 124 mg/dL (ref 70–140)
POTASSIUM: 4.5 meq/L (ref 3.5–5.1)
SODIUM: 136 meq/L (ref 136–145)
Total Bilirubin: 0.68 mg/dL (ref 0.20–1.20)
Total Protein: 6.6 g/dL (ref 6.4–8.3)

## 2015-05-07 NOTE — Telephone Encounter (Signed)
Left message for patient re 8/8 lab/fu. Schedule mailed.

## 2015-05-07 NOTE — Progress Notes (Signed)
[place] [phone]   [phone]  CONSULT NOTE  REFERRING PHYSICIAN: Dr. Lutricia Feil  REASON FOR CONSULTATION:  82 years old white male recently diagnosed with lung cancer.  HPI KRYSTLE OBERMAN is a 82 y.o. male with past medical history significant for well-controlled hypertension, dyslipidemia, atrial fibrillation, lumbar degenerative disc disease, colon polyps as well as hemorrhoids. The patient also has remote history of smoking but quit 57 years ago. The patient has chronic cough for few years but for around 6 months was getting worse and he was seen by his primary care physician. Chest x-ray showed questionable nodule in the left lung. This was followed by CT scan of the chest on 11/29/2014 and it showed a 2.8 cm pulmonary nodule in the superior segment of the left lower lobe with mild surrounding atelectasis. There is a 5 mm all right lower lobe pulmonary nodule.There is a 2 mm subpleural right lower lobe pulmonary nodule. A PET scan was performed on 12/09/2014 and it showed 3.1 CM hypermetabolic mass in the supportive segment of the left lower lobe consistent with bronchogenic carcinoma. There was no evidence of thoracic nodal or distant metastatic disease. There is tiny subcentimeter indeterminate right lung nodules. The patient was referred to Dr. Lamonte Sakai and on 12/18/2014 he underwent video bronchoscopy with electromagnetic navigational bronchoscopy. The final pathology (Accession: [id number]) also bronchial brushing of the superior segment of the right lower lobe showed malignant cells consistent with non-small cell carcinoma favoring adenocarcinoma. The patient declined surgical resection and he was referred to [facility]. He underwent percutaneous settlement ablation at [facility] on 01/03/2015 under the care of Dr. Jennye Moccasin.  The patient had recurrent left pleural effusion and he underwent several thoracenteses and the final cytology  was negative for malignancy. Repeat PET scan on 04/15/2015 showed there was only faint and right likely inflammatory activity along the periphery of the site of the prior treated superior segment left lower lobe mass with central photopenia indicating extensive central cavitation. The maximum SUV in this vicinity was 3.1 which is similar to the background mediastinal activity compared to SUV of 10.0 on the previous PET scan. He is here today for evaluation and discussion of any treatment options needed for his condition. He is a retired Engineer, drilling and interested and discussion of the target therapy if needed. When seen today he is feeling fine with no specific complaints except for cough and mild wheezes. He also has shortness of breath at baseline and increased with exertion secondary to history of COPD. He denied having any significant chest pain. The patient denied having any significant weight loss or night sweats. He denied having any visual changes. Family history significant for father with lung cancer and died at age 68, brother had leukemia, mother and sister had dementia. The patient is married and has 5 children. He is a retired Environmental education officer. He has remote history of smoking 2 pack per day for around 15 years but quit 57 years ago. He drinks wine regularly and no history of drug abuse. HPI  Past Medical History  Diagnosis Date  . Bradycardia   . Palpitations   . Asthma   . Hemorrhoids   . Hx of adenomatous colonic polyps 02/1997  . Atrial fibrillation ([place])   . Hypertension   . Diverticulosis of colon   . COPD (chronic obstructive pulmonary disease) (HCC)     mild  . Bowel obstruction ([place])   . Arthritis  osteoarthritis  . Cancer ([place])     skin cancer  . 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 Adenocarcinoma of right lung, stage 1 ([place]) 12/11/2014    Past Surgical History  Procedure Laterality Date  . Colon resection    . Total hip arthroplasty Right   .  Appendectomy    . Laminectomy      Right L4-L5 Laminectomy  . Cystoscopy    . Rotator cuff repair Right   . Umbilical hernia repair    . Video bronchoscopy with endobronchial navigation N/A 12/18/2014    Procedure: VIDEO BRONCHOSCOPY WITH ENDOBRONCHIAL NAVIGATION ;  Surgeon: Collene Gobble, MD;  Location: [facility] OR;  Service: Thoracic;  Laterality: N/A;    Family History  Problem Relation Age of Onset  . Lung cancer Father 67  . Stroke Mother 53  . Leukemia Brother   . Alzheimer's disease Sister   . Autoimmune disease Brother     Polyarteritis nodosa    Social History Social History  Substance Use Topics  . Smoking status: Former Smoker -- 0.00 packs/day for 13 years    Types: Cigarettes    Quit date: 03/29/1956  . Smokeless tobacco: Never Used  . Alcohol Use: Yes    Allergies  Allergen Reactions  . Sulfonamide Derivatives Other (See Comments)    Unk    Current Outpatient Prescriptions  Medication Sig Dispense Refill  . alendronate (FOSAMAX) 70 MG tablet Take 70 mg by mouth.    . Cetirizine HCl (ZYRTEC ALLERGY PO) Take 10 mg by mouth at bedtime.     . CRESTOR 10 MG tablet Take 5 mg by mouth at bedtime.     Marland Kitchen ELIQUIS 5 MG TABS tablet Take 5 mg by mouth.    . finasteride (PROSCAR) 5 MG tablet Take 5 mg by mouth daily.      . fluticasone (FLONASE) 50 MCG/ACT nasal spray Place 2 sprays into the nose daily.      . Fluticasone-Salmeterol (ADVAIR DISKUS) 250-50 MCG/DOSE AEPB Inhale 1 puff into the lungs 2 (two) times daily.      . furosemide (LASIX) 20 MG tablet Take 20 mg by mouth.    . Guaifenesin 1200 MG TB12 Take 1,200 mg by mouth daily as needed.     . hydrocortisone (CORTEF) 20 MG tablet Take 20 mg by mouth.    . montelukast (SINGULAIR) 10 MG tablet Take 10 mg by mouth at bedtime.      Marland Kitchen omeprazole (PRILOSEC) 20 MG capsule Take 20 mg by mouth.    . temazepam (RESTORIL) 15 MG capsule Take 15 mg by mouth at bedtime.     . Tiotropium Bromide Monohydrate (SPIRIVA RESPIMAT)  2.5 MCG/ACT AERS Inhale 2 puffs into the lungs daily.    . verapamil (COVERA HS) 180 MG (CO) 24 hr tablet Take 1 tablet (180 mg total) by mouth at bedtime. (Patient taking differently: Take 180 mg by mouth every evening. ) 30 tablet 5  . XOPENEX HFA 45 MCG/ACT inhaler 1-2 puffs every 6 (six) hours as needed for shortness of breath.     . zolpidem (AMBIEN) 10 MG tablet Take 10 mg by mouth at bedtime.     . [DISCONTINUED] beclomethasone (QVAR) 80 MCG/ACT inhaler Inhale 2 puffs into the lungs 2 (two) times daily.     No current facility-administered medications for this visit.    Review of Systems  Constitutional: negative Eyes: negative Ears, nose, mouth, throat, and face: negative Respiratory: positive for cough and dyspnea on  exertion Cardiovascular: negative Gastrointestinal: negative Genitourinary:negative Integument/breast: negative Hematologic/lymphatic: negative Musculoskeletal:negative Neurological: negative Behavioral/Psych: negative Endocrine: negative Allergic/Immunologic: negative  Physical Exam  CXK:GYJEH, healthy, no distress, well nourished and well developed SKIN: skin color, texture, turgor are normal, no rashes or significant lesions HEAD: Normocephalic, No masses, lesions, tenderness or abnormalities EYES: normal, PERRLA, Conjunctiva are pink and non-injected EARS: External ears normal, Canals clear OROPHARYNX:no exudate, no erythema and lips, buccal mucosa, and tongue normal  NECK: supple, no adenopathy, no JVD LYMPH:  no palpable lymphadenopathy, no hepatosplenomegaly LUNGS: clear to auscultation , and palpation HEART: regular rate & rhythm, no murmurs and no gallops ABDOMEN:abdomen soft, non-tender, normal bowel sounds and no masses or organomegaly BACK: Back symmetric, no curvature., No CVA tenderness EXTREMITIES:no joint deformities, effusion, or inflammation, no edema, no skin discoloration  NEURO: alert & oriented x 3 with fluent speech, no focal  motor/sensory deficits  PERFORMANCE STATUS: ECOG 1  LABORATORY DATA: Lab Results  Component Value Date   WBC 7.1 05/07/2015   HGB 11.1* 05/07/2015   HCT 34.9* 05/07/2015   MCV 87.4 05/07/2015   PLT 178 05/07/2015      Chemistry      Component Value Date/Time   NA 136 05/07/2015 1333   NA 136 01/24/2015 0419   K 4.5 05/07/2015 1333   K 4.2 01/24/2015 0419   CL 97* 01/24/2015 0419   CO2 25 05/07/2015 1333   CO2 28 01/24/2015 0419   BUN 15.3 05/07/2015 1333   BUN 18 01/24/2015 0419   CREATININE 1.0 05/07/2015 1333   CREATININE 0.79 01/24/2015 0419      Component Value Date/Time   CALCIUM 9.2 05/07/2015 1333   CALCIUM 9.2 01/24/2015 0419   ALKPHOS 71 05/07/2015 1333   ALKPHOS 60 01/07/2015 0215   AST 28 05/07/2015 1333   AST 52* 01/07/2015 0215   ALT 19 05/07/2015 1333   ALT 32 01/07/2015 0215   BILITOT 0.68 05/07/2015 1333   BILITOT 0.8 01/07/2015 0215       RADIOGRAPHIC STUDIES: Nm Pet Image Restag (ps) Skull Base To Thigh  04/15/2015  CLINICAL DATA:  Subsequent treatment strategy for squamous cell carcinoma of the superior segment left upper lobe status post cryotherapy. EXAM: NUCLEAR MEDICINE PET SKULL BASE TO THIGH TECHNIQUE: 10.0 mCi F-18 FDG was injected intravenously. Full-ring PET imaging was performed from the skull base to thigh after the radiotracer. CT data was obtained and used for attenuation correction and anatomic localization. FASTING BLOOD GLUCOSE:  Value: 121 mg/dl COMPARISON:  12/09/2014 and 01/07/2015 FINDINGS: NECK No hypermetabolic lymph nodes in the neck. CHEST Resolution of prior hypermetabolic activity associated with the superior segment left lower lobe nodule, safely photopenic am with faint marginal activity likely from atelectasis or inflammatory process along the margins, but maximum regional standard uptake value is 3.1 (formerly 10.0. Background mediastinal activity is 3.1). Small left pleural effusion without hypermetabolic activity. No  hypermetabolic adenopathy. Coronary, aortic arch, and branch vessel atherosclerotic vascular disease. Scattered bronchiectasis especially in the right upper lobe with patchy ground-glass opacities bilaterally and moderate cardiomegaly. Trace right pleural effusion. Stable 4 mm nodule anteriorly in the right lower lobe on image 45 series 6. Additional faint nodularity in the right lung is stable. ABDOMEN/PELVIS No abnormal hypermetabolic activity within the liver, pancreas, adrenal glands, or spleen. No hypermetabolic lymph nodes in the abdomen or pelvis. Old granulomatous disease involving the liver and spleen. Aortoiliac atherosclerotic vascular disease. Ectatic common iliac arteries. Sigmoid colon diverticulosis. SKELETON Faint hypermetabolic activity associated with  subtle anterior fractures of the left anterior fifth and sixth ribs, fifth rib activity SUV max 3.3, sixth rib activity SUV max 4.3. IMPRESSION: 1. There is only faint and quite likely inflammatory activity along the periphery of the site of the prior treated superior segment left lower lobe mass, with central photopenia indicating extensive central cavitation. Maximum standard uptake value in this vicinity is 3.1 which is similar to background mediastinal activity -the mass formerly had a maximum standard uptake value 10.0. 2. Small left pleural effusion without hypermetabolic activity. 3. Faintly increased activity in the left anterior fifth and sixth ribs associated with subtle healing fractures. These are thought to be benign. 4. Other imaging findings of potential clinical significance: Coronary, aortic arch, and branch vessel atherosclerotic vascular disease. Scattered bronchiectasis. Cardiomegaly with patchy ground-glass opacities in the lungs suspicious for mild edema. Trace right pleural effusion. Stable 4 mm nodule anteriorly in the right lower lobe. Aortoiliac atherosclerotic vascular disease. Sigmoid colon diverticulosis. Old granulomatous  disease. Electronically Signed   By: Van Clines M.D.   On: 04/15/2015 15:58    ASSESSMENT: This is a very pleasant 82 years old white male recently diagnosed with a stage IB (T2a, N0, M0) non-small cell lung cancer favoring adenocarcinoma presented with superior segment left lower lobe lung mass status post thermal ablation at [facility] on 01/03/2015. The patient had recurrent left pleural effusion with negative cytology but this completely resolved at this point. His most recent PET scan showed significant improvement in his disease.   PLAN: I had a lengthy discussion with the patient today about his current condition and treatment options. I explained to the patient that there is no evidence for survival benefit for systemic chemotherapy in his condition unless he has evidence for disease recurrence. I also discussed with the patient the benefit of testing for molecular biomarker-like EGFR mutation or ALK gene translocation. Unfortunately his diagnosis was made on a cytologic specimen and unlikely that will be sufficient material for molecular studies. This is also not needed at this point since the patient has early-stage disease and no evidence for recurrence. I also discussed with the patient consideration of blood test for the biomarker studies but in the absence of active malignancy the amount of circulating free DNA is expected to be very low to show any meaningful results. I recommended for the patient to continue on observation for now with repeat imaging studies by Dr. Doren Custard at [facility]. I will see him back for follow-up visit in 6 months for reevaluation. If the patient develop any evidence for disease recurrence, would consider proceeding with a tissue diagnosis and molecular studies or blood test for the circulating free DNA. The patient agreed to the current plan. He was advised to call immediately if he has any concerning symptoms in the interval. The patient  voices understanding of current disease status and treatment options and is in agreement with the current care plan.  All questions were answered. The patient knows to call the clinic with any problems, questions or concerns. We can certainly see the patient much sooner if necessary.  Thank you so much for allowing me to participate in the care of Derell R Roundy. I will continue to follow up the patient with you and assist in his care.  I spent 40 minutes counseling the patient face to face. The total time spent in the appointment was 60 minutes.  Disclaimer: This note was dictated with voice recognition software. Similar sounding words can  inadvertently be transcribed and may not be corrected upon review.   Shameka Aggarwal K. May 07, 2015, 3:09 PM

## 2015-05-09 DIAGNOSIS — M1711 Unilateral primary osteoarthritis, right knee: Secondary | ICD-10-CM | POA: Diagnosis not present

## 2015-05-13 DIAGNOSIS — J301 Allergic rhinitis due to pollen: Secondary | ICD-10-CM | POA: Diagnosis not present

## 2015-05-13 DIAGNOSIS — J3089 Other allergic rhinitis: Secondary | ICD-10-CM | POA: Diagnosis not present

## 2015-05-16 DIAGNOSIS — J301 Allergic rhinitis due to pollen: Secondary | ICD-10-CM | POA: Diagnosis not present

## 2015-05-16 DIAGNOSIS — J3089 Other allergic rhinitis: Secondary | ICD-10-CM | POA: Diagnosis not present

## 2015-05-21 DIAGNOSIS — J3089 Other allergic rhinitis: Secondary | ICD-10-CM | POA: Diagnosis not present

## 2015-05-21 DIAGNOSIS — J301 Allergic rhinitis due to pollen: Secondary | ICD-10-CM | POA: Diagnosis not present

## 2015-05-23 DIAGNOSIS — J3089 Other allergic rhinitis: Secondary | ICD-10-CM | POA: Diagnosis not present

## 2015-05-23 DIAGNOSIS — J301 Allergic rhinitis due to pollen: Secondary | ICD-10-CM | POA: Diagnosis not present

## 2015-05-28 DIAGNOSIS — J301 Allergic rhinitis due to pollen: Secondary | ICD-10-CM | POA: Diagnosis not present

## 2015-05-28 DIAGNOSIS — M1711 Unilateral primary osteoarthritis, right knee: Secondary | ICD-10-CM | POA: Diagnosis not present

## 2015-05-28 DIAGNOSIS — J3089 Other allergic rhinitis: Secondary | ICD-10-CM | POA: Diagnosis not present

## 2015-06-03 DIAGNOSIS — J3089 Other allergic rhinitis: Secondary | ICD-10-CM | POA: Diagnosis not present

## 2015-06-03 DIAGNOSIS — J301 Allergic rhinitis due to pollen: Secondary | ICD-10-CM | POA: Diagnosis not present

## 2015-06-04 DIAGNOSIS — M1711 Unilateral primary osteoarthritis, right knee: Secondary | ICD-10-CM | POA: Diagnosis not present

## 2015-06-10 DIAGNOSIS — J301 Allergic rhinitis due to pollen: Secondary | ICD-10-CM | POA: Diagnosis not present

## 2015-06-10 DIAGNOSIS — J3089 Other allergic rhinitis: Secondary | ICD-10-CM | POA: Diagnosis not present

## 2015-06-11 DIAGNOSIS — M1711 Unilateral primary osteoarthritis, right knee: Secondary | ICD-10-CM | POA: Diagnosis not present

## 2015-06-17 DIAGNOSIS — J3089 Other allergic rhinitis: Secondary | ICD-10-CM | POA: Diagnosis not present

## 2015-06-17 DIAGNOSIS — J301 Allergic rhinitis due to pollen: Secondary | ICD-10-CM | POA: Diagnosis not present

## 2015-06-24 DIAGNOSIS — J301 Allergic rhinitis due to pollen: Secondary | ICD-10-CM | POA: Diagnosis not present

## 2015-06-24 DIAGNOSIS — J3089 Other allergic rhinitis: Secondary | ICD-10-CM | POA: Diagnosis not present

## 2015-06-25 DIAGNOSIS — Z125 Encounter for screening for malignant neoplasm of prostate: Secondary | ICD-10-CM | POA: Diagnosis not present

## 2015-06-25 DIAGNOSIS — R8290 Unspecified abnormal findings in urine: Secondary | ICD-10-CM | POA: Diagnosis not present

## 2015-06-25 DIAGNOSIS — E784 Other hyperlipidemia: Secondary | ICD-10-CM | POA: Diagnosis not present

## 2015-06-25 DIAGNOSIS — M810 Age-related osteoporosis without current pathological fracture: Secondary | ICD-10-CM | POA: Diagnosis not present

## 2015-06-25 DIAGNOSIS — R7301 Impaired fasting glucose: Secondary | ICD-10-CM | POA: Diagnosis not present

## 2015-06-25 DIAGNOSIS — N390 Urinary tract infection, site not specified: Secondary | ICD-10-CM | POA: Diagnosis not present

## 2015-06-25 DIAGNOSIS — I10 Essential (primary) hypertension: Secondary | ICD-10-CM | POA: Diagnosis not present

## 2015-07-01 DIAGNOSIS — J3089 Other allergic rhinitis: Secondary | ICD-10-CM | POA: Diagnosis not present

## 2015-07-01 DIAGNOSIS — J301 Allergic rhinitis due to pollen: Secondary | ICD-10-CM | POA: Diagnosis not present

## 2015-07-02 ENCOUNTER — Other Ambulatory Visit: Payer: Self-pay | Admitting: Internal Medicine

## 2015-07-02 DIAGNOSIS — W19XXXA Unspecified fall, initial encounter: Secondary | ICD-10-CM | POA: Diagnosis not present

## 2015-07-02 DIAGNOSIS — R7301 Impaired fasting glucose: Secondary | ICD-10-CM | POA: Diagnosis not present

## 2015-07-02 DIAGNOSIS — R609 Edema, unspecified: Secondary | ICD-10-CM | POA: Diagnosis not present

## 2015-07-02 DIAGNOSIS — Z1389 Encounter for screening for other disorder: Secondary | ICD-10-CM | POA: Diagnosis not present

## 2015-07-02 DIAGNOSIS — I10 Essential (primary) hypertension: Secondary | ICD-10-CM | POA: Diagnosis not present

## 2015-07-02 DIAGNOSIS — R2689 Other abnormalities of gait and mobility: Secondary | ICD-10-CM | POA: Diagnosis not present

## 2015-07-02 DIAGNOSIS — Z6826 Body mass index (BMI) 26.0-26.9, adult: Secondary | ICD-10-CM | POA: Diagnosis not present

## 2015-07-02 DIAGNOSIS — Z0000 Encounter for general adult medical examination without abnormal findings: Secondary | ICD-10-CM | POA: Diagnosis not present

## 2015-07-02 DIAGNOSIS — Z7901 Long term (current) use of anticoagulants: Secondary | ICD-10-CM | POA: Diagnosis not present

## 2015-07-02 DIAGNOSIS — I82403 Acute embolism and thrombosis of unspecified deep veins of lower extremity, bilateral: Secondary | ICD-10-CM | POA: Diagnosis not present

## 2015-07-02 DIAGNOSIS — I480 Paroxysmal atrial fibrillation: Secondary | ICD-10-CM | POA: Diagnosis not present

## 2015-07-02 DIAGNOSIS — E784 Other hyperlipidemia: Secondary | ICD-10-CM | POA: Diagnosis not present

## 2015-07-02 DIAGNOSIS — C3412 Malignant neoplasm of upper lobe, left bronchus or lung: Secondary | ICD-10-CM

## 2015-07-08 DIAGNOSIS — J301 Allergic rhinitis due to pollen: Secondary | ICD-10-CM | POA: Diagnosis not present

## 2015-07-08 DIAGNOSIS — J3089 Other allergic rhinitis: Secondary | ICD-10-CM | POA: Diagnosis not present

## 2015-07-14 ENCOUNTER — Ambulatory Visit
Admission: RE | Admit: 2015-07-14 | Discharge: 2015-07-14 | Disposition: A | Discharge status: Home / Self Care | Payer: Medicare Other | Source: Ambulatory Visit | Attending: Internal Medicine | Admitting: Internal Medicine

## 2015-07-14 DIAGNOSIS — J90 Pleural effusion, not elsewhere classified: Secondary | ICD-10-CM | POA: Diagnosis not present

## 2015-07-14 DIAGNOSIS — R911 Solitary pulmonary nodule: Secondary | ICD-10-CM | POA: Diagnosis not present

## 2015-07-14 DIAGNOSIS — C3412 Malignant neoplasm of upper lobe, left bronchus or lung: Secondary | ICD-10-CM

## 2015-07-14 MED ORDER — IOPAMIDOL (ISOVUE-300) INJECTION 61%
75.0000 mL | Freq: Once | INTRAVENOUS | Status: AC | PRN
  Administered 2015-07-14: 75 mL via INTRAVENOUS

## 2015-07-15 DIAGNOSIS — J3089 Other allergic rhinitis: Secondary | ICD-10-CM | POA: Diagnosis not present

## 2015-07-15 DIAGNOSIS — J301 Allergic rhinitis due to pollen: Secondary | ICD-10-CM | POA: Diagnosis not present

## 2015-07-22 DIAGNOSIS — J301 Allergic rhinitis due to pollen: Secondary | ICD-10-CM | POA: Diagnosis not present

## 2015-07-22 DIAGNOSIS — J3089 Other allergic rhinitis: Secondary | ICD-10-CM | POA: Diagnosis not present

## 2015-07-25 ENCOUNTER — Ambulatory Visit: Payer: Medicare Other | Attending: Internal Medicine

## 2015-07-25 DIAGNOSIS — M6281 Muscle weakness (generalized): Secondary | ICD-10-CM

## 2015-07-25 DIAGNOSIS — R293 Abnormal posture: Secondary | ICD-10-CM | POA: Diagnosis not present

## 2015-07-25 DIAGNOSIS — R2689 Other abnormalities of gait and mobility: Secondary | ICD-10-CM | POA: Diagnosis not present

## 2015-07-25 NOTE — Therapy (Signed)
[facility] [address] [place], [place], [address] Phone: [phone]   Fax:  [phone]  Physical Therapy Evaluation  Patient Details  Name: [name] MRN: [id number] Date of Birth: Sep 06, 1984 Referring Provider: Dr. Brigitte Pulse  Encounter Date: 07/25/2015      PT End of Session - 07/25/15 1510    Visit Number 1   Number of Visits 17   Date for PT Re-Evaluation 09/23/15   Authorization Type G-code and progress note every 10th visit.   PT Start Time 1402   PT Stop Time 1450   PT Time Calculation (min) 48 min   Equipment Utilized During Treatment Gait belt   Activity Tolerance Patient tolerated treatment well   Behavior During Therapy WFL for tasks assessed/performed      Past Medical History  Diagnosis Date  . Bradycardia   . Palpitations   . Asthma   . Hemorrhoids   . Hx of adenomatous colonic polyps 02/1997  . Atrial fibrillation ([place])   . Hypertension   . Diverticulosis of colon   . COPD (chronic obstructive pulmonary disease) (HCC)     mild  . Bowel obstruction ([place])   . Arthritis     osteoarthritis  . Cancer ([place])     skin cancer  . 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[place])   . Adenocarcinoma of right lung, stage 1 ([place]) 12/11/2014    Past Surgical History  Procedure Laterality Date  . Colon resection    . Total hip arthroplasty Right   . Appendectomy    . Laminectomy      Right L4-L5 Laminectomy  . Cystoscopy    . Rotator cuff repair Right   . Umbilical hernia repair    . Video bronchoscopy with endobronchial navigation N/A 12/18/2014    Procedure: VIDEO BRONCHOSCOPY WITH ENDOBRONCHIAL NAVIGATION ;  Surgeon: Collene Gobble, MD;  Location: [facility];  Service: Thoracic;  Laterality: N/A;    There were no vitals filed for this visit.       Subjective Assessment - 07/25/15 1413    Subjective Pt reported he was able to bring wife to OPPT neuro last 03/2014 but has noticed a progressive decline in balance and  has felt weak over the last 8 months. Pt was very IND prior to his wife moving in to SNF, his personal asst. reports pt's strength has decreased  since that time. Pt was diagnosed with Stage I lung CA 11/2014 and had a cryoablation in 12/2014 and pt felt very weak after that procedure. Pt reported 4-5 falls in the last 6 months.  Pt fell out of bed a week ago but denied significant injuries.  Pt reports bending forward and dressing incr. SOB. Pt reported he experiences back pain after standing for prolonged periods of time. Pt required assist to stand at a [facility] meeting last week and uses cane in AM and RW in PM.   Patient is accompained by: --  personal asst.: Joelene Millin   Pertinent History Stage I lung CA, COPD, asthma, HTN, Lx DDD, compression fx of L1 approx. 1 year ago, R knee OA (torn lateral meniscus), a-fib   Patient Stated Goals Build strength and balance, improve my situation   Currently in Pain? No/denies            [facility] PT Assessment - 07/25/15 1418    Assessment   Medical Diagnosis Falls   Referring Provider Dr. Brigitte Pulse   Onset Date/Surgical Date 10/24/14   Hand Dominance Right  Prior Therapy HHPT after procedure this fall   Precautions   Precautions Fall   Restrictions   Weight Bearing Restrictions No   Balance Screen   Has the patient fallen in the past 6 months Yes   How many times? 4-5   Has the patient had a decrease in activity level because of a fear of falling?  Yes   Is the patient reluctant to leave their home because of a fear of falling?  No   Home Ecologist residence   Living Arrangements Other (Comment)  personal asst.: M-F (8-1p) and family can spend the night   Available Help at Discharge Family;Personal care attendant   Type of [place] to enter   Entrance Stairs-Number of Steps 3   Entrance Stairs-Rails Can reach both   Home Layout Two level  office/bonus room   Alternate Level Stairs-Number of  Steps 12   Alternate Level Stairs-Rails Can reach both   [place] - 2 wheels;Other (comment);Shower seat;Grab bars - toilet;Grab bars - tub/shower  cane with tripod attachment   Prior Function   Level of [place] Independent   Vocation Retired   Leisure Read, watching documentaries, [employer] 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 states his memory decr. when tired   Sensation   Light Touch Appears Intact   Additional Comments Pt reports intermittent N/T in B fingers currently, and N/T in BLE in 2012 which has resolved when he performs LE exercises.   Posture/Postural Control   Posture/Postural Control Postural limitations   Postural Limitations Rounded Shoulders;Forward head;Flexed trunk   Posture Comments Pt had a back brace donned during session to prevent incr. in back pain while standing but does not always wear it.    ROM / Strength   AROM / PROM / Strength AROM;Strength   AROM   Overall AROM  Within functional limits for tasks performed   Overall AROM Comments B UE/LE WFL   Strength   Overall Strength Deficits   Overall Strength Comments B UE WFL. LLE grossly  4/5 except for 3+/5 hip flexion and hip abd. RLE: hip flex: 3+/5, knee ext: 4/5, knee flex: 3+/5, ankle DF: 2+/5.abd: 3+/5.   Transfers   Transfers Sit to Stand;Stand to Sit   Sit to Stand 5: Supervision;With upper extremity assist;From chair/3-in-1   Stand to Sit 5: Supervision;With upper extremity assist;To chair/3-in-1   Stand to Sit Details Cues to improve eccentric control.   Ambulation/Gait   Ambulation/Gait Yes   Ambulation/Gait Assistance 5: Supervision;4: Min guard   Ambulation/Gait Assistance Details No overt LOB, but incr. postural sway due to pt amb. away from RW while performing turns.   Ambulation Distance (Feet) 75 Feet    Assistive device Rolling walker   Gait Pattern Step-through pattern;Decreased stride length;Decreased dorsiflexion - right;Trunk flexed;Wide base of support;Trunk rotated posteriorly on right   Ambulation Surface Level;Indoor   Gait velocity 1.17f/sec.   Standardized Balance Assessment   Standardized Balance Assessment Timed Up and Go Test;Berg Balance Test   Berg Balance Test   Sit to Stand Able to stand  independently using hands   Standing Unsupported Able to stand 2 minutes with supervision   Sitting with Back Unsupported but Feet Supported on Floor or Stool Able to sit safely and securely 2  minutes   Stand to Sit Sits safely with minimal use of hands   Transfers Able to transfer safely, definite need of hands   Standing Unsupported with Eyes Closed Able to stand 10 seconds with supervision   Standing Ubsupported with Feet Together Able to place feet together independently and stand for 1 minute with supervision   From Standing, Reach Forward with Outstretched Arm Can reach forward >12 cm safely (5")   From Standing Position, Pick up Object from Floor Able to pick up shoe, needs supervision   From Standing Position, Turn to Look Behind Over each Shoulder Looks behind one side only/other side shows less weight shift   Turn 360 Degrees Able to turn 360 degrees safely but slowly   Standing Unsupported, Alternately Place Feet on Step/Stool Able to complete 4 steps without aid or supervision   Standing Unsupported, One Foot in Front Able to take small step independently and hold 30 seconds   Standing on One Leg Tries to lift leg/unable to hold 3 seconds but remains standing independently   Total Score 39   Timed Up and Go Test   TUG Normal TUG   Normal TUG (seconds) 14.1  with RW                           PT Education - 07/25/15 1510    Education provided Yes   Education Details PT discussed frequency/duration and outcome measure results.   Person(s) Educated  Patient;Other (comment)  personal asst.   Methods Explanation   Comprehension Verbalized understanding          PT Short Term Goals - 07/25/15 1516    PT SHORT TERM GOAL #1   Title Pt will be IND in HEP to improve strength and balance. Target date: 08/22/15   Status New   PT SHORT TERM GOAL #2   Title Pt will perform TUG with LRAD in </=13.5sec. to decr. falls risk. Target date: 08/22/15   Status New   PT SHORT TERM GOAL #3   Title Pt will amb. 300' over even terrain with LRAD at MOD I level to improve functional mobility. Target date: 08/22/15   Status New   PT SHORT TERM GOAL #4   Title Pt will improve BERG score to >/=43/56 to decr. falls risk. Target date: 08/22/15   Status New   PT SHORT TERM GOAL #5   Title Pt will improve gait speed wth LRAD to >/=2.61f/sec. to decr. falls risk. Target date: 08/22/15   Status New           PT Long Term Goals - 07/25/15 1519    PT LONG TERM GOAL #1   Title Pt will verbalize understanding of falls prevention strategies to decr. risk of falls. Target date: 09/19/15   Status New   PT LONG TERM GOAL #2   Title Pt will amb. 500' with LRAD over even/uneven terrain at MOD I level to improve functional mobility. Target date: 09/19/15   Status New   PT LONG TERM GOAL #3   Title Pt will improve BERG score to >/=47/56 to decr. falls risk. Target date: 09/19/15   Status New   PT LONG TERM GOAL #4   Title Pt will improve gait speed to >/=2.671fsec. with LRAD to safely amb. in the community. Target date: 09/19/15   Status New   PT LONG TERM GOAL #5   Title Assess stairs and write goal if appropriate. Target date:  09/19/15   Status New               Plan - 07/25/15 1510    Clinical Impression Statement Pt is a pleasant 82y/o male presenting to OPPT neuro for falls and balance issues. Pt's exam revealed the following: gait deviations, impaired strength (RLE>LLE), impaired balance, postural dysfunction, decreased endurance, and pt's gait speed  TUG time and BERG score all indicate pt is at risk for falls.  Pt has active Lung CA, which is being treated by [facility] in [facility]. Pt's other co-morbidities and illness impact on personal life made this eval complex.   Rehab Potential Good   Clinical Impairments Affecting Rehab Potential co-morbidities   PT Frequency 2x / week   PT Duration 8 weeks   PT Treatment/Interventions ADLs/Self Care Home Management;Biofeedback;Canalith Repostioning;Electrical Stimulation;Balance training;Therapeutic exercise;Manual techniques;Vestibular;Therapeutic activities;Functional mobility training;Orthotic Fit/Training;Stair training;Gait training;Patient/family education;DME Instruction;Neuromuscular re-education   PT Next Visit Plan Initiate OTAGO   Consulted and Agree with Plan of Care Patient;Family member/caregiver   Family Member Consulted pt's personal asst: Joelene Millin      Patient will benefit from skilled therapeutic intervention in order to improve the following deficits and impairments:  Abnormal gait, Decreased endurance, Postural dysfunction, Impaired flexibility, Decreased balance, Decreased mobility, Decreased knowledge of use of DME, Decreased strength  Visit Diagnosis: Other abnormalities of gait and mobility - Plan: PT plan of care cert/re-cert  Muscle weakness (generalized) - Plan: PT plan of care cert/re-cert  Abnormal posture - Plan: PT plan of care cert/re-cert      G-Codes - 16/10/96 1540    Functional Assessment Tool Used BERG: 39/56; gait speed with RW: 1.27f/sec.; TUG with RW: 14.1sec.   Functional Limitation Mobility: Walking and moving around   Mobility: Walking and Moving Around Current Status [phone] At least 40 percent but less than 60 percent impaired, limited or restricted   Mobility: Walking and Moving Around Goal Status [phone] At least 1 percent but less than 20 percent impaired, limited or restricted       Problem List Patient Active Problem List   Diagnosis Date  Noted  . Pleural effusion 01/23/2015  . Acute respiratory failure with hypoxia ([place] 01/07/2015  . PAF (paroxysmal atrial fibrillation) ([place] 01/07/2015  . Acute respiratory failure ([place] 01/07/2015  . Shortness of breath 01/06/2015  . Patellar tendinitis of right knee 12/12/2014  . Adenocarcinoma of right lung, stage 1 ([place] 12/11/2014  . Traumatic rotator cuff tear 11/07/2014  . Compression fracture of L1 lumbar vertebra (HCC) 11/07/2014  . Lumbar degenerative disc disease 09/11/2014  . Osteoarthritis of right knee 09/11/2014  . Chronic anticoagulation 11/22/2012  . DYSLIPIDEMIA 08/06/2008  . COLONIC POLYPS, ADENOMATOUS 07/17/2008  . Essential hypertension 07/17/2008  . Atrial fibrillation ([place] 07/17/2008  . BRADYCARDIA 07/17/2008  . HEMORRHOIDS 07/17/2008  . Asthma 07/17/2008  . DIVERTICULOSIS, COLON 07/17/2008  . PALPITATIONS 07/17/2008    Miller,Jennifer L 07/25/2015, 3:45 PM  [facility] [address]Phone: [phone]  Fax:  [phone] Name: HKAEDON FANELLIMRN: [id number] of Birth: 706-29-1926  JGeoffry Paradise PT,DPT 07/25/2015 3:45 PM Phone: [phone]: [phone]

## 2015-07-29 DIAGNOSIS — J301 Allergic rhinitis due to pollen: Secondary | ICD-10-CM | POA: Diagnosis not present

## 2015-07-29 DIAGNOSIS — J3089 Other allergic rhinitis: Secondary | ICD-10-CM | POA: Diagnosis not present

## 2015-08-01 ENCOUNTER — Ambulatory Visit: Payer: Medicare Other | Attending: Internal Medicine

## 2015-08-01 DIAGNOSIS — R2689 Other abnormalities of gait and mobility: Secondary | ICD-10-CM | POA: Insufficient documentation

## 2015-08-01 DIAGNOSIS — M6281 Muscle weakness (generalized): Secondary | ICD-10-CM | POA: Diagnosis not present

## 2015-08-01 DIAGNOSIS — R293 Abnormal posture: Secondary | ICD-10-CM | POA: Insufficient documentation

## 2015-08-01 NOTE — Therapy (Signed)
[facility] [address] [place], [place], [address] Phone: [phone]   Fax:  [phone]  Physical Therapy Treatment  Patient Details  Name: [name] MRN: [id number] Date of Birth: [birth year] Referring Provider: Dr. Brigitte Pulse  Encounter Date: 08/01/2015      PT End of Session - 08/01/15 1523    Visit Number 2   Number of Visits 17   Date for PT Re-Evaluation 09/23/15   Authorization Type G-code and progress note every 10th visit.   PT Start Time 1316   PT Stop Time 1402   PT Time Calculation (min) 46 min   Equipment Utilized During Treatment --  min guard to S and UE support for safety   Activity Tolerance Patient tolerated treatment well   Behavior During Therapy [facility] for tasks assessed/performed      Past Medical History  Diagnosis Date  . Bradycardia   . Palpitations   . Asthma   . Hemorrhoids   . Hx of adenomatous colonic polyps 02/1997  . Atrial fibrillation ([place])   . Hypertension   . Diverticulosis of colon   . COPD (chronic obstructive pulmonary disease) (HCC)     mild  . Bowel obstruction ([place])   . Arthritis     osteoarthritis  . Cancer ([place])     skin cancer  . 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 Adenocarcinoma of right lung, stage 1 ([place]) 12/11/2014    Past Surgical History  Procedure Laterality Date  . Colon resection    . Total hip arthroplasty Right   . Appendectomy    . Laminectomy      Right L4-L5 Laminectomy  . Cystoscopy    . Rotator cuff repair Right   . Umbilical hernia repair    . Video bronchoscopy with endobronchial navigation N/A 12/18/2014    Procedure: VIDEO BRONCHOSCOPY WITH ENDOBRONCHIAL NAVIGATION ;  Surgeon: Collene Gobble, MD;  Location: [facility];  Service: Thoracic;  Laterality: N/A;    There were no vitals filed for this visit.      Subjective Assessment - 08/01/15 1318    Subjective Pt denied changes or falls since last visit.    Pertinent History Stage I lung CA,  COPD, asthma, HTN, Lx DDD, compression fx of L1 approx. 1 year ago, R knee OA (torn lateral meniscus), a-fib   Patient Stated Goals Build strength and balance, improve my situation   Currently in Pain? No/denies                              Balance Exercises - 08/01/15 1321    OTAGO PROGRAM   Head Movements Standing;5 reps   Neck Movements Other reps (comment);5 reps  supine with head on pillow   Back Extension 5 reps;Standing  with RW support   Trunk Movements Standing;5 reps  with hands on RW   Ankle Movements Sitting;10 reps   Knee Extensor Other reps (comment)  3x10 reps no wt. and x10 with 2lb. ankle wt.   Knee Flexor 20 reps   Hip ABductor 20 reps   Ankle Plantorflexors 20 reps, support   Ankle Dorsiflexors 20 reps, support   Knee Bends 10 reps, support   Backwards Walking Support   Walking and Turning Around --  not performed   Sideways Walking Assistive device   Tandem Stance 10 seconds, support   Tandem Walk --  not performed   Overall OTAGO Comments Pt  required cues and demonstration for technique.           PT Education - 08/01/15 1523    Education provided Yes   Education Details Initiated OTAGO HEP   Person(s) Educated Patient;Caregiver(s)   Methods Explanation;Demonstration;Tactile cues;Verbal cues;Handout   Comprehension Returned demonstration;Verbalized understanding;Need further instruction          PT Short Term Goals - 08/01/15 1525    PT SHORT TERM GOAL #1   Title Pt will be IND in HEP to improve strength and balance. Target date: 08/22/15   Status On-going   PT SHORT TERM GOAL #2   Title Pt will perform TUG with LRAD in </=13.5sec. to decr. falls risk. Target date: 08/22/15   Status On-going   PT SHORT TERM GOAL #3   Title Pt will amb. 300' over even terrain with LRAD at MOD I level to improve functional mobility. Target date: 08/22/15   Status On-going   PT SHORT TERM GOAL #4   Title Pt will improve BERG score to  >/=43/56 to decr. falls risk. Target date: 08/22/15   Status On-going   PT SHORT TERM GOAL #5   Title Pt will improve gait speed wth LRAD to >/=2.45f/sec. to decr. falls risk. Target date: 08/22/15   Status On-going           PT Long Term Goals - 08/01/15 1526    PT LONG TERM GOAL #1   Title Pt will verbalize understanding of falls prevention strategies to decr. risk of falls. Target date: 09/19/15   Status On-going   PT LONG TERM GOAL #2   Title Pt will amb. 500' with LRAD over even/uneven terrain at MOD I level to improve functional mobility. Target date: 09/19/15   Status On-going   PT LONG TERM GOAL #3   Title Pt will improve BERG score to >/=47/56 to decr. falls risk. Target date: 09/19/15   Status On-going   PT LONG TERM GOAL #4   Title Pt will improve gait speed to >/=2.672fsec. with LRAD to safely amb. in the community. Target date: 09/19/15   Status On-going   PT LONG TERM GOAL #5   Title Assess stairs and write goal if appropriate. Target date: 09/19/15   Status On-going               Plan - 08/01/15 1523    Clinical Impression Statement Pt tolerated OTAGO HEP well, and PT will finish remaining exercises next session. Pt did require UE support during balance and strengthening HEP 2/2 impaired balance, and cues for upright posture. Pt required UE support on RW during AROM exercises 2/2 incr. postural sway without UE support. Continue with POC.    Rehab Potential Good   Clinical Impairments Affecting Rehab Potential co-morbidities   PT Frequency 2x / week   PT Duration 8 weeks   PT Treatment/Interventions ADLs/Self Care Home Management;Biofeedback;Canalith Repostioning;Electrical Stimulation;Balance training;Therapeutic exercise;Manual techniques;Vestibular;Therapeutic activities;Functional mobility training;Orthotic Fit/Training;Stair training;Gait training;Patient/family education;DME Instruction;Neuromuscular re-education   PT Next Visit Plan Finish OTAGO and gait  training with RW and cane and provide fall prevention handout   PT [place] Agree with Plan of Care Patient;Family member/caregiver   Family Member Consulted pt's personal asst: KiJoelene Millin    Patient will benefit from skilled therapeutic intervention in order to improve the following deficits and impairments:  Abnormal gait, Decreased endurance, Postural dysfunction, Impaired flexibility, Decreased balance, Decreased mobility, Decreased knowledge of use of DME, Decreased strength  Visit Diagnosis: Other abnormalities of gait and mobility  Muscle weakness (generalized)  Abnormal posture     Problem List Patient Active Problem List   Diagnosis Date Noted  . Pleural effusion 01/23/2015  . Acute respiratory failure with hypoxia ([place]) 01/07/2015  . PAF (paroxysmal atrial fibrillation) ([place]) 01/07/2015  . Acute respiratory failure ([place]) 01/07/2015  . Shortness of breath 01/06/2015  . Patellar tendinitis of right knee 12/12/2014  . Adenocarcinoma of right lung, stage 1 ([place]) 12/11/2014  . Traumatic rotator cuff tear 11/07/2014  . Compression fracture of L1 lumbar vertebra (HCC) 11/07/2014  . Lumbar degenerative disc disease 09/11/2014  . Osteoarthritis of right knee 09/11/2014  . Chronic anticoagulation 11/22/2012  . DYSLIPIDEMIA 08/06/2008  . COLONIC POLYPS, ADENOMATOUS 07/17/2008  . Essential hypertension 07/17/2008  . Atrial fibrillation ([place]) 07/17/2008  . BRADYCARDIA 07/17/2008  . HEMORRHOIDS 07/17/2008  . Asthma 07/17/2008  . DIVERTICULOSIS, COLON 07/17/2008  . PALPITATIONS 07/17/2008    Kambra Beachem L 08/01/2015, 3:27 PM  [facility] [address] [place], [place], [address] Phone: [phone]   Fax:  [phone]  Name: [name] MRN: [id number] Date of Birth: 1924-12-24    Geoffry Paradise, PT,DPT 08/01/2015 3:27 PM Phone: [phone] Fax: [phone]

## 2015-08-05 DIAGNOSIS — J3089 Other allergic rhinitis: Secondary | ICD-10-CM | POA: Diagnosis not present

## 2015-08-05 DIAGNOSIS — J301 Allergic rhinitis due to pollen: Secondary | ICD-10-CM | POA: Diagnosis not present

## 2015-08-07 ENCOUNTER — Ambulatory Visit: Payer: Medicare Other

## 2015-08-07 DIAGNOSIS — M6281 Muscle weakness (generalized): Secondary | ICD-10-CM

## 2015-08-07 DIAGNOSIS — R293 Abnormal posture: Secondary | ICD-10-CM

## 2015-08-07 DIAGNOSIS — R2689 Other abnormalities of gait and mobility: Secondary | ICD-10-CM | POA: Diagnosis not present

## 2015-08-07 NOTE — Therapy (Signed)
[facility] [address] [place], [place], [address] Phone: [phone]   Fax:  [phone]  Physical Therapy Treatment  Patient Details  Name: [name] MRN: [id number] Date of Birth: 01/13/1925 Referring Provider: Dr. Brigitte Pulse  Encounter Date: 08/07/2015      PT End of Session - 08/07/15 1710    Visit Number 3   Number of Visits 17   Date for PT Re-Evaluation 09/23/15   Authorization Type G-code and progress note every 10th visit.   PT Start Time 1316   PT Stop Time 1359   PT Time Calculation (min) 43 min   Equipment Utilized During Treatment Gait belt   Activity Tolerance Patient tolerated treatment well   Behavior During Therapy WFL for tasks assessed/performed      Past Medical History  Diagnosis Date  . Bradycardia   . Palpitations   . Asthma   . Hemorrhoids   . Hx of adenomatous colonic polyps 02/1997  . Atrial fibrillation ([place])   . Hypertension   . Diverticulosis of colon   . COPD (chronic obstructive pulmonary disease) (HCC)     mild  . Bowel obstruction ([place])   . Arthritis     osteoarthritis  . Cancer ([place])     skin cancer  . 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[place])   . Adenocarcinoma of right lung, stage 1 ([place]) 12/11/2014    Past Surgical History  Procedure Laterality Date  . Colon resection    . Total hip arthroplasty Right   . Appendectomy    . Laminectomy      Right L4-L5 Laminectomy  . Cystoscopy    . Rotator cuff repair Right   . Umbilical hernia repair    . Video bronchoscopy with endobronchial navigation N/A 12/18/2014    Procedure: VIDEO BRONCHOSCOPY WITH ENDOBRONCHIAL NAVIGATION ;  Surgeon: Collene Gobble, MD;  Location: [facility];  Service: Thoracic;  Laterality: N/A;    There were no vitals filed for this visit.      Subjective Assessment - 08/07/15 1320    Subjective Pt denied falls and or changes since last visit. Pt reported he did bump shower door and cut L elbow but denied pain.   Pertinent History Stage I lung CA, COPD, asthma, HTN, Lx DDD, compression fx of L1 approx. 1 year ago, R knee OA (torn lateral meniscus), a-fib   Patient Stated Goals Build strength and balance, improve my situation   Currently in Pain? No/denies                         [facility] Adult PT Treatment/Exercise - 08/07/15 1359    Ambulation/Gait   Ambulation/Gait Yes   Ambulation/Gait Assistance 5: Supervision;4: Min guard   Ambulation/Gait Assistance Details Min guard with cane and S with RW. Cues for sequencing and to improve upright posture with cane.   Ambulation Distance (Feet) 75 Feet  10'x2 and 230'   Assistive device Rolling walker;Straight cane  with tripod attachment   Gait Pattern Step-through pattern;Decreased stride length;Decreased dorsiflexion - right;Trunk flexed;Wide base of support;Trunk rotated posteriorly on right   Ambulation Surface Level;Indoor             Balance Exercises - 08/07/15 1321    OTAGO PROGRAM   Tandem Walk Support   One Leg Stand 10 seconds, support   Heel Walking Support   Toe Walk Support   Heel Toe Walking Backward --  n/a   Sit to Stand  10 reps, no support   Stair Walking n/a   Overall OTAGO Comments Pt reported 4/10 R knee pain during STS exercises, and required seated rest breaks to allow pain to subside.      Self Care:     PT Education - 08/07/15 1709    Education provided Yes   Education Details PT finished OTAGO HEP and educated pt on fall prevention strategies.  PT discussed the importance of performing ankle pumps and using compression socks as prescribed by MD to reduce B LE pitting edema (as pt reported he has edema and PT verified edema). PT provided falls prevention education/handout.   Person(s) Educated Patient   Methods Explanation;Demonstration;Verbal cues;Handout   Comprehension Returned demonstration;Verbalized understanding          PT Short Term Goals - 08/01/15 1525    PT SHORT TERM GOAL #1    Title Pt will be IND in HEP to improve strength and balance. Target date: 08/22/15   Status On-going   PT SHORT TERM GOAL #2   Title Pt will perform TUG with LRAD in </=13.5sec. to decr. falls risk. Target date: 08/22/15   Status On-going   PT SHORT TERM GOAL #3   Title Pt will amb. 300' over even terrain with LRAD at MOD I level to improve functional mobility. Target date: 08/22/15   Status On-going   PT SHORT TERM GOAL #4   Title Pt will improve BERG score to >/=43/56 to decr. falls risk. Target date: 08/22/15   Status On-going   PT SHORT TERM GOAL #5   Title Pt will improve gait speed wth LRAD to >/=2.72f/sec. to decr. falls risk. Target date: 08/22/15   Status On-going           PT Long Term Goals - 08/01/15 1526    PT LONG TERM GOAL #1   Title Pt will verbalize understanding of falls prevention strategies to decr. risk of falls. Target date: 09/19/15   Status On-going   PT LONG TERM GOAL #2   Title Pt will amb. 500' with LRAD over even/uneven terrain at MOD I level to improve functional mobility. Target date: 09/19/15   Status On-going   PT LONG TERM GOAL #3   Title Pt will improve BERG score to >/=47/56 to decr. falls risk. Target date: 09/19/15   Status On-going   PT LONG TERM GOAL #4   Title Pt will improve gait speed to >/=2.646fsec. with LRAD to safely amb. in the community. Target date: 09/19/15   Status On-going   PT LONG TERM GOAL #5   Title Assess stairs and write goal if appropriate. Target date: 09/19/15   Status On-going               Plan - 08/07/15 1710    Clinical Impression Statement Pt tolerated OTAGO HEP well, and progressed to not requiring UE support during STS txfs. Pt did require seated rest breaks during session 2/2 knee pain. Pt continues to require cues for upright posture during amb. Pt would benefit from postural exericses to improve posture and balance. Continue with POC.    Rehab Potential Good   Clinical Impairments Affecting Rehab Potential  co-morbidities   PT Frequency 2x / week   PT Duration 8 weeks   PT Treatment/Interventions ADLs/Self Care Home Management;Biofeedback;Canalith Repostioning;Electrical Stimulation;Balance training;Therapeutic exercise;Manual techniques;Vestibular;Therapeutic activities;Functional mobility training;Orthotic Fit/Training;Stair training;Gait training;Patient/family education;DME Instruction;Neuromuscular re-education   PT Next Visit Plan Gait training with cane and overhead reaching activities, posture exercises   PT  Home Exercise Plan OTAGO   Consulted and Agree with Plan of Care Patient      Patient will benefit from skilled therapeutic intervention in order to improve the following deficits and impairments:  Abnormal gait, Decreased endurance, Postural dysfunction, Impaired flexibility, Decreased balance, Decreased mobility, Decreased knowledge of use of DME, Decreased strength  Visit Diagnosis: Other abnormalities of gait and mobility  Muscle weakness (generalized)  Abnormal posture     Problem List Patient Active Problem List   Diagnosis Date Noted  . Pleural effusion 01/23/2015  . Acute respiratory failure with hypoxia ([place]) 01/07/2015  . PAF (paroxysmal atrial fibrillation) ([place]) 01/07/2015  . Acute respiratory failure ([place]) 01/07/2015  . Shortness of breath 01/06/2015  . Patellar tendinitis of right knee 12/12/2014  . Adenocarcinoma of right lung, stage 1 ([place]) 12/11/2014  . Traumatic rotator cuff tear 11/07/2014  . Compression fracture of L1 lumbar vertebra (HCC) 11/07/2014  . Lumbar degenerative disc disease 09/11/2014  . Osteoarthritis of right knee 09/11/2014  . Chronic anticoagulation 11/22/2012  . DYSLIPIDEMIA 08/06/2008  . COLONIC POLYPS, ADENOMATOUS 07/17/2008  . Essential hypertension 07/17/2008  . Atrial fibrillation ([place]) 07/17/2008  . BRADYCARDIA 07/17/2008  . HEMORRHOIDS 07/17/2008  . Asthma 07/17/2008  . DIVERTICULOSIS, COLON 07/17/2008  . PALPITATIONS  07/17/2008    Arcadia Gorgas L 08/07/2015, 5:12 PM  [facility] [address] [place], [place], [address] Phone: [phone]   Fax:  [phone]  Name: [name] MRN: [id number] Date of Birth: Oct 03, 1924    Geoffry Paradise, PT,DPT 08/07/2015 5:12 PM Phone: [phone] Fax: [phone]

## 2015-08-07 NOTE — Patient Instructions (Signed)
Fall Prevention in the Home  Falls can cause injuries and can affect people from all age groups. There are many simple things that you can do to make your home safe and to help prevent falls. WHAT CAN I DO ON THE OUTSIDE OF MY HOME?  Regularly repair the edges of walkways and driveways and fix any cracks.  Remove high doorway thresholds.  Trim any shrubbery on the main path into your home.  Use bright outdoor lighting.  Clear walkways of debris and clutter, including tools and rocks.  Regularly check that handrails are securely fastened and in good repair. Both sides of any steps should have handrails.  Install guardrails along the edges of any raised decks or porches.  Have leaves, snow, and ice cleared regularly.  Use sand or salt on walkways during winter months.  In the garage, clean up any spills right away, including grease or oil spills. WHAT CAN I DO IN THE BATHROOM?  Use night lights.  Install grab bars by the toilet and in the tub and shower. Do not use towel bars as grab bars.  Use non-skid mats or decals on the floor of the tub or shower.  If you need to sit down while you are in the shower, use a plastic, non-slip stool..  Keep the floor dry. Immediately clean up any water that spills on the floor.  Remove soap buildup in the tub or shower on a regular basis.  Attach bath mats securely with double-sided non-slip rug tape.  Remove throw rugs and other tripping hazards from the floor. WHAT CAN I DO IN THE BEDROOM?  Use night lights.  Make sure that a bedside light is easy to reach.  Do not use oversized bedding that drapes onto the floor.  Have a firm chair that has side arms to use for getting dressed.  Remove throw rugs and other tripping hazards from the floor. WHAT CAN I DO IN THE KITCHEN?   Clean up any spills right away.  Avoid walking on wet floors.  Place frequently used items in easy-to-reach places.  If you need to reach for something  above you, use a sturdy step stool that has a grab bar.  Keep electrical cables out of the way.  Do not use floor polish or wax that makes floors slippery. If you have to use wax, make sure that it is non-skid floor wax.  Remove throw rugs and other tripping hazards from the floor. WHAT CAN I DO IN THE STAIRWAYS?  Do not leave any items on the stairs.  Make sure that there are handrails on both sides of the stairs. Fix handrails that are broken or loose. Make sure that handrails are as long as the stairways.  Check any carpeting to make sure that it is firmly attached to the stairs. Fix any carpet that is loose or worn.  Avoid having throw rugs at the top or bottom of stairways, or secure the rugs with carpet tape to prevent them from moving.  Make sure that you have a light switch at the top of the stairs and the bottom of the stairs. If you do not have them, have them installed. WHAT ARE SOME OTHER FALL PREVENTION TIPS?  Wear closed-toe shoes that fit well and support your feet. Wear shoes that have rubber soles or low heels.  When you use a stepladder, make sure that it is completely opened and that the sides are firmly locked. Have someone hold the ladder while you   are using it. Do not climb a closed stepladder.  Add color or contrast paint or tape to grab bars and handrails in your home. Place contrasting color strips on the first and last steps.  Use mobility aids as needed, such as canes, walkers, scooters, and crutches.  Turn on lights if it is dark. Replace any light bulbs that burn out.  Set up furniture so that there are clear paths. Keep the furniture in the same spot.  Fix any uneven floor surfaces.  Choose a carpet design that does not hide the edge of steps of a stairway.  Be aware of any and all pets.  Review your medicines with your healthcare provider. Some medicines can cause dizziness or changes in blood pressure, which increase your risk of falling. Talk  with your health care provider about other ways that you can decrease your risk of falls. This may include working with a physical therapist or trainer to improve your strength, balance, and endurance.   This information is not intended to replace advice given to you by your health care provider. Make sure you discuss any questions you have with your health care provider.   Document Released: 03/05/2002 Document Revised: 07/30/2014 Document Reviewed: 04/19/2014 Elsevier Interactive Patient Education 2016 Elsevier Inc.  

## 2015-08-08 ENCOUNTER — Ambulatory Visit: Payer: Medicare Other

## 2015-08-08 DIAGNOSIS — R2689 Other abnormalities of gait and mobility: Secondary | ICD-10-CM | POA: Diagnosis not present

## 2015-08-08 DIAGNOSIS — R293 Abnormal posture: Secondary | ICD-10-CM

## 2015-08-08 DIAGNOSIS — M6281 Muscle weakness (generalized): Secondary | ICD-10-CM | POA: Diagnosis not present

## 2015-08-08 NOTE — Therapy (Signed)
[facility] [address] [place], [place], [address] Phone: [phone]   Fax:  [phone]  Physical Therapy Treatment  Patient Details  Name: [name] MRN: [id number] Date of Birth: 03/22/1925 Referring Provider: Dr. Brigitte Pulse  Encounter Date: 08/08/2015      PT End of Session - 08/08/15 1253    Visit Number 4   Number of Visits 17   Date for PT Re-Evaluation 09/23/15   Authorization Type G-code and progress note every 10th visit.   PT Start Time 1146   PT Stop Time 1230   PT Time Calculation (min) 44 min   Activity Tolerance Patient tolerated treatment well   Behavior During Therapy WFL for tasks assessed/performed      Past Medical History  Diagnosis Date  . Bradycardia   . Palpitations   . Asthma   . Hemorrhoids   . Hx of adenomatous colonic polyps 02/1997  . Atrial fibrillation ([place])   . Hypertension   . Diverticulosis of colon   . COPD (chronic obstructive pulmonary disease) (HCC)     mild  . Bowel obstruction ([place])   . Arthritis     osteoarthritis  . Cancer ([place])     skin cancer  . 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 Adenocarcinoma of right lung, stage 1 ([place]) 12/11/2014    Past Surgical History  Procedure Laterality Date  . Colon resection    . Total hip arthroplasty Right   . Appendectomy    . Laminectomy      Right L4-L5 Laminectomy  . Cystoscopy    . Rotator cuff repair Right   . Umbilical hernia repair    . Video bronchoscopy with endobronchial navigation N/A 12/18/2014    Procedure: VIDEO BRONCHOSCOPY WITH ENDOBRONCHIAL NAVIGATION ;  Surgeon: Collene Gobble, MD;  Location: [facility];  Service: Thoracic;  Laterality: N/A;    There were no vitals filed for this visit.      Subjective Assessment - 08/08/15 1151    Subjective Pt denied falls since last visit. Pt reported he's tired today, as he awoke in the middle of the night, ate, and then fell asleep in recliner and overslept.   Pertinent  History Stage I lung CA, COPD, asthma, HTN, Lx DDD, compression fx of L1 approx. 1 year ago, R knee OA (torn lateral meniscus), a-fib   Patient Stated Goals Build strength and balance, improve my situation   Currently in Pain? Yes   Pain Score 3    Pain Location Knee   Pain Orientation Right   Pain Descriptors / Indicators Sharp   Pain Type Chronic pain   Pain Onset More than a month ago   Pain Frequency Intermittent   Aggravating Factors  standing/walking   Pain Relieving Factors rest                         [facility] Adult PT Treatment/Exercise - 08/08/15 1220    Transfers   Transfers Sit to Stand;Stand to Sit   Sit to Stand 4: Min guard;4: Min assist;5: Supervision   Sit to Stand Details Tactile cues for placement;Verbal cues for technique;Verbal cues for sequencing   Sit to Stand Details (indicate cue type and reason) Pt performed from 15" box with large cushion to mimic recliner at home, with 1-2 UE support on stools (to mimic chair movement) and chairs. Performed x10 reps with cues for sequencing and wt. shifting. x10 reps from mat and  lower chair with S and cues for technique.   Exercises   Exercises Knee/Hip;Shoulder   Knee/Hip Exercises: Machines for Strengthening   Cybex Leg Press 50lb. with B LE 2x10 sets. Cues to improve eccentric control.   Shoulder Exercises: Seated   Retraction Strengthening;10 reps;Both   Theraband Level (Shoulder Retraction) Level 1 (Yellow)   Retraction Limitations Cues for technique, x10 reps no band and 2x10 reps with band.              PT Education - 08/08/15 1252    Education provided Yes   Education Details PT provided pt with scap retraction HEP. PT also provided pt with education regarding ACT by [facility] for training after PT d/c and Tai Chi brochure.    Person(s) Educated Patient   Methods Explanation;Demonstration;Tactile cues;Verbal cues;Handout   Comprehension Returned demonstration;Verbalized understanding           PT Short Term Goals - 08/01/15 1525    PT SHORT TERM GOAL #1   Title Pt will be IND in HEP to improve strength and balance. Target date: 08/22/15   Status On-going   PT SHORT TERM GOAL #2   Title Pt will perform TUG with LRAD in </=13.5sec. to decr. falls risk. Target date: 08/22/15   Status On-going   PT SHORT TERM GOAL #3   Title Pt will amb. 300' over even terrain with LRAD at MOD I level to improve functional mobility. Target date: 08/22/15   Status On-going   PT SHORT TERM GOAL #4   Title Pt will improve BERG score to >/=43/56 to decr. falls risk. Target date: 08/22/15   Status On-going   PT SHORT TERM GOAL #5   Title Pt will improve gait speed wth LRAD to >/=2.40f/sec. to decr. falls risk. Target date: 08/22/15   Status On-going           PT Long Term Goals - 08/01/15 1526    PT LONG TERM GOAL #1   Title Pt will verbalize understanding of falls prevention strategies to decr. risk of falls. Target date: 09/19/15   Status On-going   PT LONG TERM GOAL #2   Title Pt will amb. 500' with LRAD over even/uneven terrain at MOD I level to improve functional mobility. Target date: 09/19/15   Status On-going   PT LONG TERM GOAL #3   Title Pt will improve BERG score to >/=47/56 to decr. falls risk. Target date: 09/19/15   Status On-going   PT LONG TERM GOAL #4   Title Pt will improve gait speed to >/=2.65fsec. with LRAD to safely amb. in the community. Target date: 09/19/15   Status On-going   PT LONG TERM GOAL #5   Title Assess stairs and write goal if appropriate. Target date: 09/19/15   Status On-going               Plan - 08/08/15 1253    Clinical Impression Statement Pt experienced incr. difficulty performing STS from lower surfaces with compliant cushion vs. mat. Pt progressed to less cues and assist during last 2 reps of STS. Pt continues to require cues to improve upright posture. Pt required rest breaks during session 2/2 fatigue. Continue with POC.    Rehab  Potential Good   Clinical Impairments Affecting Rehab Potential co-morbidities   PT Frequency 2x / week   PT Duration 8 weeks   PT Treatment/Interventions ADLs/Self Care Home Management;Biofeedback;Canalith Repostioning;Electrical Stimulation;Balance training;Therapeutic exercise;Manual techniques;Vestibular;Therapeutic activities;Functional mobility training;Orthotic Fit/Training;Stair training;Gait training;Patient/family education;DME Instruction;Neuromuscular re-education  PT Next Visit Plan Gait training with cane and overhead reaching activities, posture exercises, LE strengthening   PT Home Exercise Plan OTAGO   Consulted and Agree with Plan of Care Patient      Patient will benefit from skilled therapeutic intervention in order to improve the following deficits and impairments:  Abnormal gait, Decreased endurance, Postural dysfunction, Impaired flexibility, Decreased balance, Decreased mobility, Decreased knowledge of use of DME, Decreased strength  Visit Diagnosis: Muscle weakness (generalized)  Abnormal posture  Other abnormalities of gait and mobility     Problem List Patient Active Problem List   Diagnosis Date Noted  . Pleural effusion 01/23/2015  . Acute respiratory failure with hypoxia ([place]) 01/07/2015  . PAF (paroxysmal atrial fibrillation) ([place]) 01/07/2015  . Acute respiratory failure ([place]) 01/07/2015  . Shortness of breath 01/06/2015  . Patellar tendinitis of right knee 12/12/2014  . Adenocarcinoma of right lung, stage 1 ([place]) 12/11/2014  . Traumatic rotator cuff tear 11/07/2014  . Compression fracture of L1 lumbar vertebra (HCC) 11/07/2014  . Lumbar degenerative disc disease 09/11/2014  . Osteoarthritis of right knee 09/11/2014  . Chronic anticoagulation 11/22/2012  . DYSLIPIDEMIA 08/06/2008  . COLONIC POLYPS, ADENOMATOUS 07/17/2008  . Essential hypertension 07/17/2008  . Atrial fibrillation ([place]) 07/17/2008  . BRADYCARDIA 07/17/2008  . HEMORRHOIDS  07/17/2008  . Asthma 07/17/2008  . DIVERTICULOSIS, COLON 07/17/2008  . PALPITATIONS 07/17/2008    Syann Cupples L 08/08/2015, 12:58 PM  [facility] [address] [place], [place], [address] Phone: [phone]   Fax:  [phone]  Name: [name] MRN: [id number] Date of Birth: [birth year]    Geoffry Paradise, PT,DPT 08/08/2015 12:58 PM Phone: [phone] Fax: [phone]

## 2015-08-08 NOTE — Patient Instructions (Signed)
Shoulder Pinch    Pinch shoulder blades together, while holding onto yellow band (have either [place] hold band or tie middle of band to a solid post). Hold __2__ seconds while counting out loud. Repeat __10__ times. Perform 3 sets. Do __3__ sessions per week.  http://gt2.exer.us/551   Copyright  VHI. All rights reserved.

## 2015-08-12 DIAGNOSIS — J301 Allergic rhinitis due to pollen: Secondary | ICD-10-CM | POA: Diagnosis not present

## 2015-08-12 DIAGNOSIS — J3089 Other allergic rhinitis: Secondary | ICD-10-CM | POA: Diagnosis not present

## 2015-08-14 ENCOUNTER — Ambulatory Visit: Payer: Medicare Other

## 2015-08-15 ENCOUNTER — Ambulatory Visit: Payer: Medicare Other | Admitting: Physical Therapy

## 2015-08-15 DIAGNOSIS — R2689 Other abnormalities of gait and mobility: Secondary | ICD-10-CM

## 2015-08-15 DIAGNOSIS — R293 Abnormal posture: Secondary | ICD-10-CM | POA: Diagnosis not present

## 2015-08-15 DIAGNOSIS — M6281 Muscle weakness (generalized): Secondary | ICD-10-CM

## 2015-08-16 NOTE — Therapy (Signed)
[facility] [address] [place], [place], [address] Phone: [phone]   Fax:  [phone]  Physical Therapy Treatment  Patient Details  Name: [name] MRN: [id number] Date of Birth: 08-05-1924 Referring Provider: Dr. Brigitte Pulse  Encounter Date: 08/15/2015      PT End of Session - 08/16/15 1725    Visit Number 5   Number of Visits 17   Date for PT Re-Evaluation 09/23/15   Authorization Type G-code and progress note every 10th visit.   PT Start Time 1103   PT Stop Time 1147   PT Time Calculation (min) 44 min   Equipment Utilized During Treatment Gait belt      Past Medical History  Diagnosis Date  . Bradycardia   . Palpitations   . Asthma   . Hemorrhoids   . Hx of adenomatous colonic polyps 02/1997  . Atrial fibrillation ([place])   . Hypertension   . Diverticulosis of colon   . COPD (chronic obstructive pulmonary disease) (HCC)     mild  . Bowel obstruction ([facility])   . Arthritis     osteoarthritis  . Cancer ([place])     skin cancer  . 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 Adenocarcinoma of right lung, stage 1 ([place] [place]) 12/11/2014    Past Surgical History  Procedure Laterality Date  . Colon resection    . Total hip arthroplasty Right   . Appendectomy    . Laminectomy      Right L4-L5 Laminectomy  . Cystoscopy    . Rotator cuff repair Right   . Umbilical hernia repair    . Video bronchoscopy with endobronchial navigation N/A 12/18/2014    Procedure: VIDEO BRONCHOSCOPY WITH ENDOBRONCHIAL NAVIGATION ;  Surgeon: Collene Gobble, MD;  Location: [facility];  Service: Thoracic;  Laterality: N/A;    There were no vitals filed for this visit.      Subjective Assessment - 08/16/15 1718    Subjective Pt states he brought the RW and the cane - but only uses the RW at night when going to bathroom   Pertinent History Stage I lung CA, COPD, asthma, HTN, Lx DDD, compression fx of L1 approx. 1 year ago, R knee OA (torn lateral  meniscus), a-fib   Patient Stated Goals Build strength and balance, improve my situation   Currently in Pain? Yes   Pain Score 3    Pain Location Back   Pain Orientation Lower   Pain Descriptors / Indicators Aching   Pain Type Chronic pain   Pain Onset More than a month ago   Pain Frequency Intermittent   Aggravating Factors  standing and walking   Pain Relieving Factors seated position - pt is wearing a back brace (neoprene belt)                         [facility] Adult PT Treatment/Exercise - 08/16/15 0001    Transfers   Transfers Sit to Stand   Sit to Stand 5: Supervision   Sit to Stand Details Verbal cues for technique   Number of Reps 10 reps   Ambulation/Gait   Ambulation/Gait Yes   Ambulation/Gait Assistance 5: Supervision;4: Min guard   Ambulation Distance (Feet) 150 Feet   Assistive device Straight cane   Gait Pattern Step-through pattern   Ambulation Surface Level;Indoor   Knee/Hip Exercises: Machines for Strengthening   Cybex Leg Press 50# - 2 sets 10 reps bil. LE's  Balance Exercises - 08/16/15 1724    Balance Exercises: Standing   Rockerboard Anterior/posterior;20 seconds   Other Standing Exercises alternate stepping on incline 10 reps each leg; marching on incline with mod assist      sidestepping, marching performed inside // bars with UE support prn 10' x 2 reps for sidestepping; stepping over  and back of balance beam inside bars 5 reps each leg for improved SLS with UE support prn  Standing overhead reaching activity with pt reaching with RUE and with LUE to touch PT's hand in various locations- to increase R shoulder ROM  Pt performed alternate tap ups to 1st step - 10 reps each foot and then to 2nd step - 5 reps each foot for improved SLS        PT Short Term Goals - 08/01/15 1525    PT SHORT TERM GOAL #1   Title Pt will be IND in HEP to improve strength and balance. Target date: 08/22/15   Status On-going   PT SHORT  TERM GOAL #2   Title Pt will perform TUG with LRAD in </=13.5sec. to decr. falls risk. Target date: 08/22/15   Status On-going   PT SHORT TERM GOAL #3   Title Pt will amb. 300' over even terrain with LRAD at MOD I level to improve functional mobility. Target date: 08/22/15   Status On-going   PT SHORT TERM GOAL #4   Title Pt will improve BERG score to >/=43/56 to decr. falls risk. Target date: 08/22/15   Status On-going   PT SHORT TERM GOAL #5   Title Pt will improve gait speed wth LRAD to >/=2.47f/sec. to decr. falls risk. Target date: 08/22/15   Status On-going           PT Long Term Goals - 08/01/15 1526    PT LONG TERM GOAL #1   Title Pt will verbalize understanding of falls prevention strategies to decr. risk of falls. Target date: 09/19/15   Status On-going   PT LONG TERM GOAL #2   Title Pt will amb. 500' with LRAD over even/uneven terrain at MOD I level to improve functional mobility. Target date: 09/19/15   Status On-going   PT LONG TERM GOAL #3   Title Pt will improve BERG score to >/=47/56 to decr. falls risk. Target date: 09/19/15   Status On-going   PT LONG TERM GOAL #4   Title Pt will improve gait speed to >/=2.667fsec. with LRAD to safely amb. in the community. Target date: 09/19/15   Status On-going   PT LONG TERM GOAL #5   Title Assess stairs and write goal if appropriate. Target date: 09/19/15   Status On-going               Plan - 08/16/15 1726    Clinical Impression Statement Pt had diificulty maintaining balance with marching and alternate stepping on incline - requiring mod to min assist; pt also reported some R knee discomfort with stepping down on decline so this activity was discontinued; pt demonstrated good dynamic standing balance  with basic amb. on flat surface   Rehab Potential Good   Clinical Impairments Affecting Rehab Potential co-morbidities   PT Frequency 2x / week   PT Duration 8 weeks   PT Treatment/Interventions ADLs/Self Care Home  Management;Biofeedback;Canalith Repostioning;Electrical Stimulation;Balance training;Therapeutic exercise;Manual techniques;Vestibular;Therapeutic activities;Functional mobility training;Orthotic Fit/Training;Stair training;Gait training;Patient/family education;DME Instruction;Neuromuscular re-education   PT Next Visit Plan Cont gait and balance training   PT Home Exercise Plan [facility]  Consulted and Agree with Plan of Care Patient      Patient will benefit from skilled therapeutic intervention in order to improve the following deficits and impairments:  Abnormal gait, Decreased endurance, Postural dysfunction, Impaired flexibility, Decreased balance, Decreased mobility, Decreased knowledge of use of DME, Decreased strength  Visit Diagnosis: Other abnormalities of gait and mobility  Abnormal posture  Muscle weakness (generalized)     Problem List Patient Active Problem List   Diagnosis Date Noted  . Pleural effusion 01/23/2015  . Acute respiratory failure with hypoxia ([facility]) 01/07/2015  . PAF (paroxysmal atrial fibrillation) ([facility]) 01/07/2015  . Acute respiratory failure ([facility]) 01/07/2015  . Shortness of breath 01/06/2015  . Patellar tendinitis of right knee 12/12/2014  . Adenocarcinoma of right lung, stage 1 ([facility]) 12/11/2014  . Traumatic rotator cuff tear 11/07/2014  . Compression fracture of L1 lumbar vertebra (HCC) 11/07/2014  . Lumbar degenerative disc disease 09/11/2014  . Osteoarthritis of right knee 09/11/2014  . Chronic anticoagulation 11/22/2012  . DYSLIPIDEMIA 08/06/2008  . COLONIC POLYPS, ADENOMATOUS 07/17/2008  . Essential hypertension 07/17/2008  . Atrial fibrillation ([facility]) 07/17/2008  . BRADYCARDIA 07/17/2008  . HEMORRHOIDS 07/17/2008  . Asthma 07/17/2008  . DIVERTICULOSIS, COLON 07/17/2008  . PALPITATIONS 07/17/2008    Alda Lea, PT 08/16/2015, 5:30 PM  [facility] [address] [place], [place], [address] Phone: [phone]   Fax:  [phone]  Name: ENIO HORNBACK MRN: [id number] Date of Birth: December 12, 1924

## 2015-08-17 ENCOUNTER — Emergency Department (HOSPITAL_COMMUNITY)
Admission: EM | Admit: 2015-08-17 | Discharge: 2015-08-17 | Disposition: A | Discharge status: Home / Self Care | Payer: Medicare Other | Attending: Emergency Medicine | Admitting: Emergency Medicine

## 2015-08-17 DIAGNOSIS — Z7951 Long term (current) use of inhaled steroids: Secondary | ICD-10-CM | POA: Diagnosis not present

## 2015-08-17 DIAGNOSIS — Z79899 Other long term (current) drug therapy: Secondary | ICD-10-CM | POA: Diagnosis not present

## 2015-08-17 DIAGNOSIS — T148 Other injury of unspecified body region: Secondary | ICD-10-CM | POA: Diagnosis not present

## 2015-08-17 DIAGNOSIS — W1839XA Other fall on same level, initial encounter: Secondary | ICD-10-CM | POA: Insufficient documentation

## 2015-08-17 DIAGNOSIS — W19XXXA Unspecified fall, initial encounter: Secondary | ICD-10-CM

## 2015-08-17 DIAGNOSIS — S51811A Laceration without foreign body of right forearm, initial encounter: Secondary | ICD-10-CM | POA: Diagnosis not present

## 2015-08-17 DIAGNOSIS — S51812A Laceration without foreign body of left forearm, initial encounter: Secondary | ICD-10-CM | POA: Insufficient documentation

## 2015-08-17 DIAGNOSIS — S50312A Abrasion of left elbow, initial encounter: Secondary | ICD-10-CM | POA: Insufficient documentation

## 2015-08-17 DIAGNOSIS — Y9389 Activity, other specified: Secondary | ICD-10-CM | POA: Insufficient documentation

## 2015-08-17 DIAGNOSIS — Y92009 Unspecified place in unspecified non-institutional (private) residence as the place of occurrence of the external cause: Secondary | ICD-10-CM | POA: Diagnosis not present

## 2015-08-17 DIAGNOSIS — S41109A Unspecified open wound of unspecified upper arm, initial encounter: Secondary | ICD-10-CM | POA: Diagnosis not present

## 2015-08-17 DIAGNOSIS — S50811A Abrasion of right forearm, initial encounter: Secondary | ICD-10-CM | POA: Diagnosis present

## 2015-08-17 DIAGNOSIS — Y999 Unspecified external cause status: Secondary | ICD-10-CM | POA: Insufficient documentation

## 2015-08-17 NOTE — Discharge Instructions (Signed)
Deep Skin Avulsion A deep skin avulsion is a type of open wound. It often results from a severe injury (trauma) that tears away all layers of the skin or an entire body part. The areas of the body that are most often affected by a deep skin avulsion include the face, lips, ears, nose, and fingers. A deep skin avulsion may make structures below the skin become visible. You may be able to see muscle, bone, nerves, and blood vessels. A deep skin avulsion can also damage important structures beneath the skin. These include tendons, ligaments, nerves, or blood vessels. CAUSES Injuries that often cause a deep skin avulsion include:  Being crushed.  Falling against a jagged surface.  Animal bites.  Gunshot wounds.  Severe burns.  Injuries that involve being dragged, such as bicycle or motorcycle accidents. SYMPTOMS Symptoms of a deep skin avulsion include:  Pain.  Numbness.  Swelling.  A misshapen body part.  Bleeding, which may be heavy.  Fluid leaking from the wound. DIAGNOSIS This condition may be diagnosed with a medical history and physical exam. You may also have X-rays done. TREATMENT The treatment that is chosen for a deep skin avulsion depends on how large and deep the wound is and where it is located. Treatment for all types of avulsions usually starts with:  Controlling the bleeding.  Washing out the wound with a germ-free (sterile) salt-water solution.  Removing dead tissue from the wound. A wound may be closed or left open to heal. This depends on the size and location of the wound and whether it is likely to become infected. Wounds are usually covered or closed if they expose blood vessels, nerves, bone, or cartilage.  Wounds that are small and clean may be closed with stitches (sutures).  Wounds that cannot be closed with sutures may be covered with a piece of skin (graft) or a skin flap. Skin may be taken from on or near the wound, from another part of the body,  or from a donor.  Wounds may be left open if they are hard to close or they may become infected. These wounds heal over time from the bottom up. You may also receive medicine. This may include:  Antibiotics.  A tetanus shot.  Rabies vaccine. HOME CARE INSTRUCTIONS Medicines  Take or apply over-the-counter and prescription medicines only as told by your health care provider.  If you were prescribed an antibiotic, take or apply it as told by your health care provider. Do not stop taking the antibiotic even if your condition improves.  You may get anti-itch medicine while your wound is healing. Use it only as told by your health care provider. Wound Care  There are many ways to close and cover a wound. For example, a wound can be covered with sutures, skin glue, or adhesive strips. Follow instructions from your health care provider about:  How to take care of your wound.  When and how you should change your bandage (dressing).  When you should remove your dressing.  Removing whatever was used to close your wound.  Keep the dressing dry as told by your health care provider. Do not take baths, swim, use a hot tub, or do anything that would put your wound underwater until your health care provider approves.  Clean the wound each day or as told by your health care provider.  Wash the wound with mild soap and water.  Rinse the wound with water to remove all soap.  Pat the wound   dry with a clean towel. Do not rub it.  Do not scratch or pick at the wound.  Check your wound every day for signs of infection. Watch for:  Redness, swelling, or pain.  Fluid, blood, or pus. General Instructions  Raise (elevate) the injured area above the level of your heart while you are sitting or lying down.  Keep all follow-up visits as told by your health care provider. This is important. SEEK MEDICAL CARE IF:  You received a tetanus shot and you have swelling, severe pain, redness, or  bleeding at the injection site.  You have a fever.  Your pain is not controlled with medicine.  You have increased redness, swelling, or pain at the site of your wound.  You have fluid, blood, or pus coming from your wound.  You notice a bad smell coming from your wound or your dressing.  A wound that was closed breaks open.  You notice something coming out of the wound, such as wood or glass.  You notice a change in the color of your skin near your wound.  You develop a new rash.  You need to change the dressing frequently due to fluid, blood, or pus draining from the wound. SEEK IMMEDIATE MEDICAL CARE IF:  Your pain suddenly increases and is severe.  You develop severe swelling around the wound.  You develop numbness around the wound.  You have nausea and vomiting that does not go away after 24 hours.  You feel light-headed, weak, or faint.  You develop chest pain.  You have trouble breathing.  Your wound is on your hand or foot and you cannot properly move a finger or toe.  The wound is on your hand or foot and you notice that your fingers or toes look pale or bluish.  You have a red streak going away from your wound.   This information is not intended to replace advice given to you by your health care provider. Make sure you discuss any questions you have with your health care provider.   Document Released: 05/11/2006 Document Revised: 07/30/2014 Document Reviewed: 03/20/2014 Elsevier Interactive Patient Education 2016 [employer]  

## 2015-08-17 NOTE — ED Provider Notes (Signed)
CSN: [id number]     Arrival date & time 08/17/15  0143 History  By signing my name below, I, Randa Evens, attest that this documentation has been prepared under the direction and in the presence of Ripley Fraise, MD. Electronically Signed: Randa Evens, ED Scribe. 08/17/2015. 3:28 AM.     Chief Complaint  Patient presents with  . Fall    Patient is a 82 y.o. male presenting with fall. The history is provided by the patient. No language interpreter was used.  Fall   HPI Comments: [name] is a 82 y.o. male brought in by ambulance, who presents to the Emergency Department complaining of fall onset tonight PTA. Pt states that he was chasing his dog and fell in between two pieces of furniture. Pt states that he was ambulating without his walker. He states that at night he normally ambulates with a walker. Pt present with abrasions to the right forearm and left elbow. Denies head injury or LOC. Pt denies neck pain, back pain, or any other symptoms. Pt states that his tetanus is UTD.  No past medical history on file. No past surgical history on file. No family history on file. Social History  Substance Use Topics  . Smoking status: Not on file  . Smokeless tobacco: Not on file  . Alcohol Use: Not on file    Review of Systems  Musculoskeletal: Negative for back pain, arthralgias and neck pain.  Skin: Positive for wound.  Neurological: Negative for syncope.  All other systems reviewed and are negative.     Allergies  Sulfa antibiotics  Home Medications   Prior to Admission medications   Medication Sig Start Date End Date Taking? Authorizing Provider  alendronate (FOSAMAX) 70 MG tablet Take 70 mg by mouth once a week. Take with a full glass of water on an empty stomach.   Yes Historical Provider, MD  apixaban (ELIQUIS) 5 MG TABS tablet Take 5 mg by mouth 2 (two) times daily.    Yes Historical Provider, MD  calcium-vitamin D (OSCAL WITH D) 500-200 MG-UNIT tablet Take 1  tablet by mouth daily.   Yes Historical Provider, MD  cetirizine (ZYRTEC) 10 MG tablet Take 10 mg by mouth daily.   Yes Historical Provider, MD  finasteride (PROSCAR) 5 MG tablet Take 5 mg by mouth daily.   Yes Historical Provider, MD  fluticasone (FLONASE) 50 MCG/ACT nasal spray Place 2 sprays into both nostrils every morning.   Yes Historical Provider, MD  Fluticasone-Salmeterol (ADVAIR) 250-50 MCG/DOSE AEPB Inhale 1 puff into the lungs 2 (two) times daily.   Yes Historical Provider, MD  furosemide (LASIX) 20 MG tablet Take 20 mg by mouth daily as needed for fluid.   Yes Historical Provider, MD  hydrocortisone (CORTEF) 20 MG tablet Take 20-40 mg by mouth daily. 40 in the morning and 20 mg at night   Yes Historical Provider, MD  levalbuterol [facility] HFA) 45 MCG/ACT inhaler Inhale into the lungs every 4 (four) hours as needed for wheezing.   Yes Historical Provider, MD  montelukast (SINGULAIR) 10 MG tablet Take 10 mg by mouth at bedtime.   Yes Historical Provider, MD  Probiotic Product (PROBIOTIC PO) Take 1 capsule by mouth daily.   Yes Historical Provider, MD  rosuvastatin (CRESTOR) 10 MG tablet Take 10 mg by mouth daily.   Yes Historical Provider, MD  temazepam (RESTORIL) 15 MG capsule Take 15 mg by mouth at bedtime as needed for sleep.   Yes Historical Provider, MD  Tiotropium  Bromide Monohydrate (SPIRIVA RESPIMAT) 2.5 MCG/ACT AERS Inhale 2 puffs into the lungs every morning.   Yes Historical Provider, MD  verapamil (CALAN-SR) 180 MG CR tablet Take 180 mg by mouth at bedtime.   Yes Historical Provider, MD  zolpidem (AMBIEN) 10 MG tablet Take 10 mg by mouth at bedtime as needed for sleep.   Yes Historical Provider, MD   BP 94/65 mmHg  Pulse 64  Temp(Src) 98.2 F (36.8 C) (Oral)  Resp 20  SpO2 96%   Physical Exam  CONSTITUTIONAL: Well developed/well nourished HEAD: Normocephalic/atraumatic EYES: EOMI ENMT: Mucous membranes moist NECK: supple no meningeal signs SPINE/BACK:entire spine  nontender CV: S1/S2 noted, no murmurs/rubs/gallops noted LUNGS: no apparent distress,scattered wheezing bilaterally no distress noted  ABDOMEN: soft, nontender NEURO: Pt is awake/alert/appropriate, moves all extremitiesx4.  EXTREMITIES: pulses normal/equal, full ROM, All extremities/joints palpated/ranged and nontender SKIN: warm, color normal, large skin tear to right forearm, smaller skin tear near right wrist, small skin tear to left elbow PSYCH: no abnormalities of mood noted, alert and oriented to situation  ED Course  Procedures  DIAGNOSTIC STUDIES: Oxygen Saturation is 96% on RA, adequate by my interpretation.    COORDINATION OF CARE: 3:31 AM-Discussed treatment plan with pt at bedside and pt agreed to plan.      Pt with skin tears from fall Denied Head injury, no LOC and no headache He has no other complaints except for skin tears Wounds not amenable to repair Nursing cleased wounds and wrapped with nonadherent dressing Advised need to monitor wound for signs of infection and PCP followup Pt decided to leave prior to re-evaluation and discussion of discharge instructions  MDM   Final diagnoses:  Fall, initial encounter  Skin tear of right forearm without complication, initial encounter  Skin tear of left forearm without complication, initial encounter      Nursing notes including past medical history and social history reviewed and considered in documentation   I personally performed the services described in this documentation, which was scribed in my presence. The recorded information has been reviewed and is accurate.       Ripley Fraise, MD 08/17/15 [phone]

## 2015-08-17 NOTE — ED Notes (Signed)
Pt BIB EMS from home. Pt fell at home. Pt did have some wine this evening. Denies LOC or hitting head. Pt does take blood thinners. 2 inch skin tear on R forearm. Bleeding controlled. A&Ox4.

## 2015-08-17 NOTE — ED Notes (Signed)
Pt able to stand and ambulate with walker (baseline). No distress noted.

## 2015-08-17 NOTE — ED Notes (Signed)
Skin tears cleaned with saline and wrapped with non adherent dressings. No active bleeding noted.

## 2015-08-17 NOTE — ED Notes (Signed)
MD at bedside. 

## 2015-08-17 NOTE — ED Notes (Signed)
Bed: WA03 Expected date:  Expected time:  Means of arrival:  Comments: EMS 89 fall

## 2015-08-19 ENCOUNTER — Ambulatory Visit (INDEPENDENT_AMBULATORY_CARE_PROVIDER_SITE_OTHER): Payer: Medicare Other | Admitting: Family Medicine

## 2015-08-19 VITALS — BP 124/80 | HR 86 | Temp 97.7°F | Resp 17 | Ht 70.0 in | Wt 184.0 lb

## 2015-08-19 DIAGNOSIS — J3089 Other allergic rhinitis: Secondary | ICD-10-CM | POA: Diagnosis not present

## 2015-08-19 DIAGNOSIS — J301 Allergic rhinitis due to pollen: Secondary | ICD-10-CM | POA: Diagnosis not present

## 2015-08-19 DIAGNOSIS — S41111A Laceration without foreign body of right upper arm, initial encounter: Secondary | ICD-10-CM

## 2015-08-19 NOTE — Progress Notes (Signed)
Visit 82 year old gentleman who tripped on his small dog on Sunday morning at about 2 AM, 2 days ago. He fell into the fireplace and suffered evulsion lacerations to his right forearm in 3 places and a small area on her left elbow. He was seen in the emergency room and bandage there. He continues to lose blood and wants to be evaluated for redressing.  He gets his tetanus shots through his family doctor.  Objective:BP 124/80 mmHg  Pulse 86  Temp(Src) 97.7 F (36.5 C) (Oral)  Resp 17  Ht '5\' 10"'$  (1.778 m)  Wt 184 lb (83.462 kg)  BMI 26.40 kg/m2  SpO2 94% I spent 45 minutes redressing his wounds trying to get the skin edges as close together as possible. I used Steri-Strips and Dermabond to secure the wound edges as best I could although much of the thin skin had retracted away from the acute vertex at the extreme end of the laceration.  1 laceration measured 3 cm, another 5 cm, and the third and distal laceration on the right arm 2 cm. Laceration on his left elbow measured 1 cm. This laceration was dry and there is no swelling.  Assessment: Wound redressing after several lacerations were draining blood from wound suffered on Sunday morning  Plan: Patient was given dressing materials including Telfa and 4 x 4's with Coban.  Signed Robyn Haber M.D.

## 2015-08-21 ENCOUNTER — Ambulatory Visit: Payer: Medicare Other

## 2015-08-21 DIAGNOSIS — M6281 Muscle weakness (generalized): Secondary | ICD-10-CM

## 2015-08-21 DIAGNOSIS — R293 Abnormal posture: Secondary | ICD-10-CM

## 2015-08-21 DIAGNOSIS — R2689 Other abnormalities of gait and mobility: Secondary | ICD-10-CM | POA: Diagnosis not present

## 2015-08-21 NOTE — Therapy (Signed)
[facility] [address] [place], [place], [address] Phone: [phone]   Fax:  [phone]  Physical Therapy Treatment  Patient Details  Name: [name] MRN: [id number] Date of Birth: December 30, 1924 Referring Provider: Dr. Brigitte Pulse  Encounter Date: 08/21/2015      PT End of Session - 08/21/15 1146    Visit Number 6   Number of Visits 17   Date for PT Re-Evaluation 09/23/15   Authorization Type G-code and progress note every 10th visit.   PT Start Time 1104   PT Stop Time 1145   PT Time Calculation (min) 41 min   Equipment Utilized During Treatment Gait belt   Activity Tolerance Patient limited by fatigue   Behavior During Therapy WFL for tasks assessed/performed      Past Medical History  Diagnosis Date  . Bradycardia   . Palpitations   . Asthma   . Hemorrhoids   . Hx of adenomatous colonic polyps 02/1997  . Atrial fibrillation ([place])   . Hypertension   . Diverticulosis of colon   . COPD (chronic obstructive pulmonary disease) (HCC)     mild  . Bowel obstruction ([place])   . Arthritis     osteoarthritis  . Cancer ([place])     skin cancer  . 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[place])   . Adenocarcinoma of right lung, stage 1 ([place]) 12/11/2014    Past Surgical History  Procedure Laterality Date  . Colon resection    . Total hip arthroplasty Right   . Appendectomy    . Laminectomy      Right L4-L5 Laminectomy  . Cystoscopy    . Rotator cuff repair Right   . Umbilical hernia repair    . Video bronchoscopy with endobronchial navigation N/A 12/18/2014    Procedure: VIDEO BRONCHOSCOPY WITH ENDOBRONCHIAL NAVIGATION ;  Surgeon: Collene Gobble, MD;  Location: [facility];  Service: Thoracic;  Laterality: N/A;    There were no vitals filed for this visit.      Subjective Assessment - 08/21/15 1106    Subjective Pt reported he fell twice this week, once on Saturday night (2AM) and went to ED and cut his arm. He fell while amb. without  RW, while chasing the dog. Pt denied hitting head during first fall but he used life alert and ambulance took him to ED. Pt also f/u with MD because R UE would not cease bleeding. Pt fell again Tuesday while standing and taking sweater off, he fell backwards and hit head on carpeted floor. He used life alert again, EMS arrived and caregiver arrived and assisted pt in getting up. Pt denied signs of concussion per pt and his caregiver Joelene Millin). Pt reports he's jittery today, he felt like he was really improving but now feels like he's took a step backwards. Pt's caregiver also felt like pt was improving a lot.    Patient is accompained by: --  caregiver-Kimberly   Pertinent History Stage I lung CA, COPD, asthma, HTN, Lx DDD, compression fx of L1 approx. 1 year ago, R knee OA (torn lateral meniscus), a-fib   Patient Stated Goals Build strength and balance, improve my situation   Currently in Pain? No/denies  but R UE painful with movement                Vestibular Assessment - 08/21/15 1115    Occulomotor Exam   Occulomotor Alignment Normal   Spontaneous Absent   Gaze-induced Absent   Smooth Pursuits Intact  Saccades Slow  Bilat.   Comment No c/o dizziness.   Vestibulo-Occular Reflex   VOR 1 Head Only (x 1 viewing) Pt performed at decr. speed with difficulty keeping eyes on target and no dizziness reported                 [facility] Adult PT Treatment/Exercise - 08/21/15 1122    Standardized Balance Assessment   Standardized Balance Assessment Berg Balance Test;Timed Up and Go Test   Berg Balance Test   Sit to Stand Able to stand  independently using hands  pt was able to perform s hands prior to fall   Standing Unsupported Able to stand safely 2 minutes   Sitting with Back Unsupported but Feet Supported on Floor or Stool Able to sit safely and securely 2 minutes   Stand to Sit Sits safely with minimal use of hands   Transfers Able to transfer safely, definite need of  hands  performed 1 STS s hands   Standing Unsupported with Eyes Closed Able to stand 10 seconds with supervision   Standing Ubsupported with Feet Together Able to place feet together independently and stand 1 minute safely   From Standing, Reach Forward with Outstretched Arm Can reach forward >12 cm safely (5")   From Standing Position, Pick up Object from Floor Able to pick up shoe, needs supervision   From Standing Position, Turn to Look Behind Over each Shoulder Looks behind from both sides and weight shifts well   Turn 360 Degrees Able to turn 360 degrees safely but slowly   Standing Unsupported, Alternately Place Feet on Step/Stool Able to complete 4 steps without aid or supervision   Standing Unsupported, One Foot in Front Able to take small step independently and hold 30 seconds   Standing on One Leg Able to lift leg independently and hold equal to or more than 3 seconds   Total Score 43   Timed Up and Go Test   TUG Normal TUG   Normal TUG (seconds) 18.3  with cane/tripod attachment with min guard, performed twice, as pt experienced LOB during first stand to sit (while turning) and required min A to maintain balance. Min guard to S during second attempt.                PT Education - 08/21/15 1146    Education provided Yes   Education Details PT discussed goal progress and the importance of using RW at all times to ensure safety during amb.   Person(s) Educated Patient;Caregiver(s)   Methods Explanation   Comprehension Verbalized understanding          PT Short Term Goals - 08/21/15 1152    PT SHORT TERM GOAL #1   Title Pt will be IND in HEP to improve strength and balance. Target date: 08/22/15   Status On-going   PT SHORT TERM GOAL #2   Title Pt will perform TUG with LRAD in </=13.5sec. to decr. falls risk. Target date: 08/22/15   Status Not Met   PT SHORT TERM GOAL #3   Title Pt will amb. 300' over even terrain with LRAD at MOD I level to improve functional  mobility. Target date: 08/22/15   Status On-going   PT SHORT TERM GOAL #4   Title Pt will improve BERG score to >/=43/56 to decr. falls risk. Target date: 08/22/15   Status Achieved   PT SHORT TERM GOAL #5   Title Pt will improve gait speed wth LRAD to >/=2.39f/sec. to  decr. falls risk. Target date: 08/22/15   Status On-going           PT Long Term Goals - 08/01/15 1526    PT LONG TERM GOAL #1   Title Pt will verbalize understanding of falls prevention strategies to decr. risk of falls. Target date: 09/19/15   Status On-going   PT LONG TERM GOAL #2   Title Pt will amb. 500' with LRAD over even/uneven terrain at MOD I level to improve functional mobility. Target date: 09/19/15   Status On-going   PT LONG TERM GOAL #3   Title Pt will improve BERG score to >/=47/56 to decr. falls risk. Target date: 09/19/15   Status On-going   PT LONG TERM GOAL #4   Title Pt will improve gait speed to >/=2.66f/sec. with LRAD to safely amb. in the community. Target date: 09/19/15   Status On-going   PT LONG TERM GOAL #5   Title Assess stairs and write goal if appropriate. Target date: 09/19/15   Status On-going               Plan - 08/21/15 1147    Clinical Impression Statement Pt demonstrated progress as he met STG4 but did not meet STG 2. Pt required rest breaks 2/2 fatigue today, and reported he was more fearful of falling after falls earlier this week. However, pt amb. into PT session with cane vs. RW. Pt denied dizziness, however, pt performed modified vestibular assessment and results consistent with hypofunctioning of vestibular system. Pt also fell while eyes closed (doffing sweater), which requires incr. input from somatosensory and vestibular systems. Continue with POC.    Rehab Potential Good   Clinical Impairments Affecting Rehab Potential co-morbidities   PT Frequency 2x / week   PT Duration 8 weeks   PT Treatment/Interventions ADLs/Self Care Home Management;Biofeedback;Canalith  Repostioning;Electrical Stimulation;Balance training;Therapeutic exercise;Manual techniques;Vestibular;Therapeutic activities;Functional mobility training;Orthotic Fit/Training;Stair training;Gait training;Patient/family education;DME Instruction;Neuromuscular re-education   PT Next Visit Plan Finish assessing STGs and begin vestibular balance training.   PT Home Exercise Plan OTAGO   Consulted and Agree with Plan of Care Patient   Family Member Consulted pt's personal asst: KJoelene Millin     Patient will benefit from skilled therapeutic intervention in order to improve the following deficits and impairments:  Abnormal gait, Decreased endurance, Postural dysfunction, Impaired flexibility, Decreased balance, Decreased mobility, Decreased knowledge of use of DME, Decreased strength  Visit Diagnosis: Other abnormalities of gait and mobility  Abnormal posture  Muscle weakness (generalized)     Problem List Patient Active Problem List   Diagnosis Date Noted  . Pleural effusion 01/23/2015  . Acute respiratory failure with hypoxia ([place] 01/07/2015  . PAF (paroxysmal atrial fibrillation) ([facility] 01/07/2015  . Acute respiratory failure ([facility] 01/07/2015  . Shortness of breath 01/06/2015  . Patellar tendinitis of right knee 12/12/2014  . Adenocarcinoma of right lung, stage 1 ([facility] 12/11/2014  . Traumatic rotator cuff tear 11/07/2014  . Compression fracture of L1 lumbar vertebra (HCC) 11/07/2014  . Lumbar degenerative disc disease 09/11/2014  . Osteoarthritis of right knee 09/11/2014  . Chronic anticoagulation 11/22/2012  . DYSLIPIDEMIA 08/06/2008  . COLONIC POLYPS, ADENOMATOUS 07/17/2008  . Essential hypertension 07/17/2008  . Atrial fibrillation ([facility] 07/17/2008  . BRADYCARDIA 07/17/2008  . HEMORRHOIDS 07/17/2008  . Asthma 07/17/2008  . DIVERTICULOSIS, COLON 07/17/2008  . PALPITATIONS 07/17/2008    Raushanah Osmundson L 08/21/2015, 11:52 AM  [place] [address]Phone: [phone]  Fax:  [phone]  Name: [name] MRN: [id number] Date of Birth: [birth year]    Geoffry Paradise, PT,DPT 08/21/2015 11:52 AM Phone: [phone] Fax: [phone]

## 2015-08-22 ENCOUNTER — Ambulatory Visit: Payer: Medicare Other

## 2015-08-22 DIAGNOSIS — R293 Abnormal posture: Secondary | ICD-10-CM

## 2015-08-22 DIAGNOSIS — R2689 Other abnormalities of gait and mobility: Secondary | ICD-10-CM

## 2015-08-22 DIAGNOSIS — M6281 Muscle weakness (generalized): Secondary | ICD-10-CM

## 2015-08-22 NOTE — Therapy (Signed)
[facility] [address] [place], [place], [address] Phone: [phone]   Fax:  [phone]  Physical Therapy Treatment  Patient Details  Name: [name] MRN: [id number] Date of Birth: [birth year] Referring Provider: Dr. Brigitte Pulse  Encounter Date: 08/22/2015      PT End of Session - 08/22/15 1305    Visit Number 7   Number of Visits 17   Date for PT Re-Evaluation 09/23/15   Authorization Type G-code and progress note every 10th visit.   PT Start Time 1101   PT Stop Time 1140   PT Time Calculation (min) 39 min   Equipment Utilized During Treatment Gait belt   Activity Tolerance Patient limited by fatigue   Behavior During Therapy WFL for tasks assessed/performed      Past Medical History  Diagnosis Date  . Bradycardia   . Palpitations   . Asthma   . Hemorrhoids   . Hx of adenomatous colonic polyps 02/1997  . Atrial fibrillation ([place])   . Hypertension   . Diverticulosis of colon   . COPD (chronic obstructive pulmonary disease) (HCC)     mild  . Bowel obstruction ([place])   . Arthritis     osteoarthritis  . Cancer ([place])     skin cancer  . 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 Adenocarcinoma of right lung, stage 1 ([place]) 12/11/2014    Past Surgical History  Procedure Laterality Date  . Colon resection    . Total hip arthroplasty Right   . Appendectomy    . Laminectomy      Right L4-L5 Laminectomy  . Cystoscopy    . Rotator cuff repair Right   . Umbilical hernia repair    . Video bronchoscopy with endobronchial navigation N/A 12/18/2014    Procedure: VIDEO BRONCHOSCOPY WITH ENDOBRONCHIAL NAVIGATION ;  Surgeon: Collene Gobble, MD;  Location: [facility];  Service: Thoracic;  Laterality: N/A;    There were no vitals filed for this visit.      Subjective Assessment - 08/22/15 1104    Subjective Pt reported he still feels a little "blah" this week since falls. Pt denied falls since yesterday.   Patient is accompained  by: --  [name]   Pertinent History Stage I lung CA, COPD, asthma, HTN, Lx DDD, compression fx of L1 approx. 1 year ago, R knee OA (torn lateral meniscus), a-fib   Patient Stated Goals Build strength and balance, improve my situation   Currently in Pain? No/denies                         [facility] Adult PT Treatment/Exercise - 08/22/15 1105    Ambulation/Gait   Ambulation/Gait Yes   Ambulation/Gait Assistance 6: Modified independent (Device/Increase time);5: Supervision   Ambulation/Gait Assistance Details MOD I level with RW and S with cane to ensure safety. Pt required seated rest breaks after amb. 2/2 SOB and fatigue.    Ambulation Distance (Feet) 345 Feet  with RW and 200' with RW and SPC   Assistive device Straight cane;Rolling walker   Gait Pattern Step-through pattern;Trunk flexed   Ambulation Surface Level;Indoor   Gait velocity First trial: 2.10f/sec with RW. Second trial: 2.325fsec with RW and First trial with cane and tripod attachment: 2.23  Second trial with cane and tripod attachment 2.3863fec             Balance Exercises - 08/22/15 1123    OTAGO PROGRAM   Head Movements  Standing;5 reps   Neck Movements Sitting;5 reps  cues for technique   Back Extension Standing;5 reps   Trunk Movements Standing;5 reps  cues for technique   Ankle Movements Sitting;10 reps   Knee Extensor 20 reps;Weight (comment)  3lbs.   Knee Flexor 20 reps   Hip ABductor 20 reps   Ankle Plantorflexors 20 reps, support   Ankle Dorsiflexors 20 reps, support           PT Education - 08/22/15 1305    Education provided Yes   Education Details PT discussed goal progress and progressed HEP as tolerated. PT also encouraged pt to f/u with PCP regarding falls, fatigue, and intermittent sacral pain.   Person(s) Educated Patient;Caregiver(s)   Methods Explanation   Comprehension Verbalized understanding          PT Short Term Goals - 08/22/15 1308    PT SHORT  TERM GOAL #1   Title Pt will be IND in HEP to improve strength and balance. Target date: 08/22/15   Status Partially Met   PT SHORT TERM GOAL #2   Title Pt will perform TUG with LRAD in </=13.5sec. to decr. falls risk. Target date: 08/22/15   Status Not Met   PT SHORT TERM GOAL #3   Title Pt will amb. 300' over even terrain with LRAD at MOD I level to improve functional mobility. Target date: 08/22/15   Status Achieved   PT SHORT TERM GOAL #4   Title Pt will improve BERG score to >/=43/56 to decr. falls risk. Target date: 08/22/15   Status Achieved   PT SHORT TERM GOAL #5   Title Pt will improve gait speed wth LRAD to >/=2.71f/sec. to decr. falls risk. Target date: 08/22/15   Status Achieved           PT Long Term Goals - 08/01/15 1526    PT LONG TERM GOAL #1   Title Pt will verbalize understanding of falls prevention strategies to decr. risk of falls. Target date: 09/19/15   Status On-going   PT LONG TERM GOAL #2   Title Pt will amb. 500' with LRAD over even/uneven terrain at MOD I level to improve functional mobility. Target date: 09/19/15   Status On-going   PT LONG TERM GOAL #3   Title Pt will improve BERG score to >/=47/56 to decr. falls risk. Target date: 09/19/15   Status On-going   PT LONG TERM GOAL #4   Title Pt will improve gait speed to >/=2.684fsec. with LRAD to safely amb. in the community. Target date: 09/19/15   Status On-going   PT LONG TERM GOAL #5   Title Assess stairs and write goal if appropriate. Target date: 09/19/15   Status On-going               Plan - 08/22/15 1305    Clinical Impression Statement Pt met STGs 3 and 5, and partially met STG 1. Pt required cues for technique during HEP but did demonstrate improve quad strength, as he tolerated incr. ankle weight during session. PT will finish assessing HEP next visit and continue to progress as tolerated and begin balance activities to improve vestibular system as pt experiences incr. sway and LOB  during head turns and eyes closed. Continue with POC.    Rehab Potential Good   Clinical Impairments Affecting Rehab Potential co-morbidities   PT Frequency 2x / week   PT Duration 8 weeks   PT Treatment/Interventions ADLs/Self Care Home Management;Biofeedback;Canalith Repostioning;Electrical Stimulation;Balance training;Therapeutic exercise;Manual  techniques;Vestibular;Therapeutic activities;Functional mobility training;Orthotic Fit/Training;Stair training;Gait training;Patient/family education;DME Instruction;Neuromuscular re-education   PT Next Visit Plan Finish assessing HEP and progress prn and begin vestibular balance training.   PT Home Exercise Plan OTAGO and postural strengthening   Consulted and Agree with Plan of Care Patient   Family Member Consulted pt's personal asst: Joelene Millin      Patient will benefit from skilled therapeutic intervention in order to improve the following deficits and impairments:  Abnormal gait, Decreased endurance, Postural dysfunction, Impaired flexibility, Decreased balance, Decreased mobility, Decreased knowledge of use of DME, Decreased strength  Visit Diagnosis: Other abnormalities of gait and mobility  Muscle weakness (generalized)  Abnormal posture     Problem List Patient Active Problem List   Diagnosis Date Noted  . Pleural effusion 01/23/2015  . Acute respiratory failure with hypoxia ([place]) 01/07/2015  . PAF (paroxysmal atrial fibrillation) ([place]) 01/07/2015  . Acute respiratory failure ([place]) 01/07/2015  . Shortness of breath 01/06/2015  . Patellar tendinitis of right knee 12/12/2014  . Adenocarcinoma of right lung, stage 1 ([place]) 12/11/2014  . Traumatic rotator cuff tear 11/07/2014  . Compression fracture of L1 lumbar vertebra (HCC) 11/07/2014  . Lumbar degenerative disc disease 09/11/2014  . Osteoarthritis of right knee 09/11/2014  . Chronic anticoagulation 11/22/2012  . DYSLIPIDEMIA 08/06/2008  . COLONIC POLYPS, ADENOMATOUS  07/17/2008  . Essential hypertension 07/17/2008  . Atrial fibrillation ([place]) 07/17/2008  . BRADYCARDIA 07/17/2008  . HEMORRHOIDS 07/17/2008  . Asthma 07/17/2008  . DIVERTICULOSIS, COLON 07/17/2008  . PALPITATIONS 07/17/2008    Shawndra Clute L 08/22/2015, 1:09 PM  [facility] [address] [place], [place], [address] Phone: [phone]   Fax:  [phone]  Name: [name] MRN: [id number] Date of Birth: Jul 28, 1924    Geoffry Paradise, PT,DPT 08/22/2015 1:09 PM Phone: [phone] Fax: [phone]

## 2015-08-25 ENCOUNTER — Telehealth: Payer: Self-pay | Admitting: Family Medicine

## 2015-08-25 NOTE — Telephone Encounter (Signed)
Answering service call.  Calling about dressing change. No new problems, just didn't know about follow up and thought wound may need to be looked at. No redness, swelling, fever or worsening.  Advised to be seen in am for wound eval. Continue routine dressing changes until that time.

## 2015-08-26 ENCOUNTER — Ambulatory Visit (INDEPENDENT_AMBULATORY_CARE_PROVIDER_SITE_OTHER): Payer: Medicare Other | Admitting: Physician Assistant

## 2015-08-26 VITALS — BP 116/82 | HR 93 | Temp 98.0°F | Resp 16 | Ht 70.0 in | Wt 183.0 lb

## 2015-08-26 DIAGNOSIS — J301 Allergic rhinitis due to pollen: Secondary | ICD-10-CM | POA: Diagnosis not present

## 2015-08-26 DIAGNOSIS — S41111A Laceration without foreign body of right upper arm, initial encounter: Secondary | ICD-10-CM

## 2015-08-26 DIAGNOSIS — J3089 Other allergic rhinitis: Secondary | ICD-10-CM | POA: Diagnosis not present

## 2015-08-26 NOTE — Progress Notes (Signed)
[name]  MRN: [id number] DOB: 1924-10-06  Subjective:  Pt presents to clinic for recheck of a wound on his right arm.  He has kept it wrapped and keeps adding to the wrap and it moves.  He has less pain but he has noticed that he is swollen in parts of his arm.  He is having no F/C.  Patient Active Problem List   Diagnosis Date Noted  . Pleural effusion 01/23/2015  . Acute respiratory failure with hypoxia ([place]) 01/07/2015  . PAF (paroxysmal atrial fibrillation) ([place]) 01/07/2015  . Acute respiratory failure ([place]) 01/07/2015  . Shortness of breath 01/06/2015  . Patellar tendinitis of right knee 12/12/2014  . Adenocarcinoma of right lung, stage 1 ([place]) 12/11/2014  . Traumatic rotator cuff tear 11/07/2014  . Compression fracture of L1 lumbar vertebra (HCC) 11/07/2014  . Lumbar degenerative disc disease 09/11/2014  . Osteoarthritis of right knee 09/11/2014  . Chronic anticoagulation 11/22/2012  . DYSLIPIDEMIA 08/06/2008  . COLONIC POLYPS, ADENOMATOUS 07/17/2008  . Essential hypertension 07/17/2008  . Atrial fibrillation ([place]) 07/17/2008  . BRADYCARDIA 07/17/2008  . HEMORRHOIDS 07/17/2008  . Asthma 07/17/2008  . DIVERTICULOSIS, COLON 07/17/2008  . PALPITATIONS 07/17/2008    Current Outpatient Prescriptions on File Prior to Visit  Medication Sig Dispense Refill  . alendronate (FOSAMAX) 70 MG tablet Take 70 mg by mouth once a week. Take with a full glass of water on an empty stomach.    Marland Kitchen apixaban (ELIQUIS) 5 MG TABS tablet Take 5 mg by mouth 2 (two) times daily.     . calcium-vitamin D (OSCAL WITH D) 500-200 MG-UNIT tablet Take 1 tablet by mouth daily.    . cetirizine (ZYRTEC) 10 MG tablet Take 10 mg by mouth daily.    . Cetirizine HCl (ZYRTEC ALLERGY PO) Take 10 mg by mouth at bedtime.     . finasteride (PROSCAR) 5 MG tablet Take 5 mg by mouth daily.    . fluticasone (FLONASE) 50 MCG/ACT nasal spray Place 2 sprays into both nostrils every morning.    .  Fluticasone-Salmeterol (ADVAIR) 250-50 MCG/DOSE AEPB Inhale 1 puff into the lungs 2 (two) times daily.    . furosemide (LASIX) 20 MG tablet Take 20 mg by mouth daily as needed for fluid.    . Guaifenesin 1200 MG TB12 Take 1,200 mg by mouth daily as needed.     . hydrocortisone (CORTEF) 20 MG tablet Take 20 mg by mouth 3 (three) times daily.     Marland Kitchen levalbuterol (XOPENEX HFA) 45 MCG/ACT inhaler Inhale into the lungs every 4 (four) hours as needed for wheezing.    . montelukast (SINGULAIR) 10 MG tablet Take 10 mg by mouth at bedtime.    Marland Kitchen omeprazole (PRILOSEC) 20 MG capsule Take 20 mg by mouth. Reported on 07/25/2015    . Probiotic Product (PROBIOTIC PO) Take 1 capsule by mouth daily.    . rosuvastatin (CRESTOR) 10 MG tablet Take 10 mg by mouth daily.    . temazepam (RESTORIL) 15 MG capsule Take 15 mg by mouth at bedtime as needed for sleep.    . Tiotropium Bromide Monohydrate (SPIRIVA RESPIMAT) 2.5 MCG/ACT AERS Inhale 2 puffs into the lungs daily.    . verapamil (COVERA HS) 180 MG (CO) 24 hr tablet Take 1 tablet (180 mg total) by mouth at bedtime. (Patient taking differently: Take 180 mg by mouth every evening. ) 30 tablet 5  . XOPENEX HFA 45 MCG/ACT inhaler 1-2 puffs every 6 (six) hours as needed  for shortness of breath.     . zolpidem (AMBIEN) 10 MG tablet Take 10 mg by mouth at bedtime.     . CRESTOR 10 MG tablet Take 5 mg by mouth at bedtime. Reported on 08/26/2015    . [DISCONTINUED] beclomethasone (QVAR) 80 MCG/ACT inhaler Inhale 2 puffs into the lungs 2 (two) times daily.     No current facility-administered medications on file prior to visit.    Allergies  Allergen Reactions  . Sulfa Antibiotics Other (See Comments)    Unsure of reaction  . Sulfonamide Derivatives Other (See Comments)    Unk    Review of Systems  Constitutional: Negative for fever and chills.  Skin: Positive for wound.   Objective:  BP 116/82 mmHg  Pulse 93  Temp(Src) 98 F (36.7 C) (Oral)  Resp 16  Ht '5\' 10"'$   (1.778 m)  Wt 183 lb (83.008 kg)  BMI 26.26 kg/m2  SpO2 92%  Physical Exam  Constitutional: He is oriented to person, place, and time and well-developed, well-nourished, and in no distress.  HENT:  Head: Normocephalic and atraumatic.  Right Ear: External ear normal.  Left Ear: External ear normal.  Eyes: Conjunctivae are normal.  Neck: Normal range of motion.  Pulmonary/Chest: Effort normal.  Neurological: He is alert and oriented to person, place, and time. Gait normal.  Skin: Skin is warm and dry.  Coban and drsgs are removed - the wound looks good without surrounding erythema - the distal wound is well healed with steristrips  - the middle wound is still bleeding slightly steristrips in place - the proximal wound is macerated in the center - the arm has areas of swelling where the coban had moved and created a slight tourniquet affect  Psychiatric: Mood, memory, affect and judgment normal.   Procedure:  Nonstick pads placed over the wounds and an CE wrap placed loosely to hold the drsgs in place and to give even pressure along the arm to reduce the swelling that is currently present Assessment and Plan :  Laceration of right arm with complication, initial encounter   Continue keeping the wounds covered with non-stick drsg and ace wrap while out but while at home he can keep opened to air - he is to be aware of erythema if it should occur and he should f/u at that time.  Windell Hummingbird PA-C  [facility] 08/26/2015 10:15 AM

## 2015-08-26 NOTE — Patient Instructions (Addendum)
Ok to bath - just take off the Ace wrap and then rewrap after the skin is dry - it is ok to leave this open to the air to allow the wound to heal faster.      IF you received an x-ray today, you will receive an invoice from [facility]. Please contact [facility] at [phone] with questions or concerns regarding your invoice.   IF you received labwork today, you will receive an invoice from [employer]. Please contact [facility] at [phone] with questions or concerns regarding your invoice.   Our billing staff will not be able to assist you with questions regarding bills from these companies.  You will be contacted with the lab results as soon as they are available. The fastest way to get your results is to activate your My Chart account. Instructions are located on the last page of this paperwork. If you have not heard from [place] regarding the results in 2 weeks, please contact this office.

## 2015-08-28 ENCOUNTER — Ambulatory Visit: Payer: Medicare Other | Attending: Internal Medicine

## 2015-08-28 ENCOUNTER — Telehealth: Payer: Self-pay

## 2015-08-28 DIAGNOSIS — R2689 Other abnormalities of gait and mobility: Secondary | ICD-10-CM | POA: Insufficient documentation

## 2015-08-28 DIAGNOSIS — R293 Abnormal posture: Secondary | ICD-10-CM | POA: Insufficient documentation

## 2015-08-28 DIAGNOSIS — M6281 Muscle weakness (generalized): Secondary | ICD-10-CM | POA: Insufficient documentation

## 2015-08-28 NOTE — Therapy (Signed)
[facility] [address] [place], [place], [address] Phone: [phone]   Fax:  [phone]  Physical Therapy Treatment  Patient Details  Name: [name] MRN: [id number] Date of Birth: 05-14-1924 Referring Provider: Dr. Brigitte Pulse  Encounter Date: 08/28/2015      PT End of Session - 08/28/15 1156    Visit Number 8   Number of Visits 17   Date for PT Re-Evaluation 09/23/15   Authorization Type G-code and progress note every 10th visit.   PT Start Time 1108   PT Stop Time 1151   PT Time Calculation (min) 43 min   Equipment Utilized During Treatment Gait belt   Activity Tolerance Patient tolerated treatment well   Behavior During Therapy WFL for tasks assessed/performed      Past Medical History  Diagnosis Date  . Bradycardia   . Palpitations   . Asthma   . Hemorrhoids   . Hx of adenomatous colonic polyps 02/1997  . Atrial fibrillation ([place])   . Hypertension   . Diverticulosis of colon   . COPD (chronic obstructive pulmonary disease) (HCC)     mild  . Bowel obstruction ([place])   . Arthritis     osteoarthritis  . Cancer ([place])     skin cancer  . 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 Adenocarcinoma of right lung, stage 1 ([place]) 12/11/2014    Past Surgical History  Procedure Laterality Date  . Colon resection    . Total hip arthroplasty Right   . Appendectomy    . Laminectomy      Right L4-L5 Laminectomy  . Cystoscopy    . Rotator cuff repair Right   . Umbilical hernia repair    . Video bronchoscopy with endobronchial navigation N/A 12/18/2014    Procedure: VIDEO BRONCHOSCOPY WITH ENDOBRONCHIAL NAVIGATION ;  Surgeon: Collene Gobble, MD;  Location: [facility];  Service: Thoracic;  Laterality: N/A;    There were no vitals filed for this visit.      Subjective Assessment - 08/28/15 1111    Subjective Pt denied falls since last visit. Pt reported he had R UE wound re-dressed at MD.   Pertinent History Stage I lung CA,  COPD, asthma, HTN, Lx DDD, compression fx of L1 approx. 1 year ago, R knee OA (torn lateral meniscus), a-fib   Patient Stated Goals Build strength and balance, improve my situation   Currently in Pain? Yes   Pain Score 2    Pain Location Sacrum   Pain Orientation Right;Lower   Pain Descriptors / Indicators Aching   Pain Type Acute pain   Pain Onset In the past 7 days   Pain Frequency Intermittent   Aggravating Factors  sitting   Pain Relieving Factors lying down                08/28/15 1201  Balance Exercises: Standing  Standing Eyes Opened Wide (BOA);Foam/compliant surface;2 reps;30 secs  Standing Eyes Closed Wide (BOA);Foam/compliant surface;10 secs;Other reps (comment) (7 reps)  OTAGO PROGRAM  Knee Bends Level B (ii)  Backwards Walking Level B  Walking and Turning Around (n/a)  Sideways Walking Level B (1 UE support)  Tandem Stance Level A (cues for technique.)  Tandem Walk Level C  One Leg Stand Level B  Heel Walking Level C  Toe Walk Level C  Heel Toe Walking Backward (n/a)  Sit to Stand Level C (i)  Stair Walking n/a  Overall OTAGO Comments Cues for technique and rest  breaks to allow R knee pain to subside.                     PT Education - 08/28/15 1154    Education provided Yes   Education Details PT modified and progressed OTAGO HEP as tolerated. PT also added vestibular balance activity, as pt experiences incr. postural sway while standing on compliant surfaces with eyes closed and fell at home when it was dark. PT discussed asking MD regarding modifying medication schedule, as pt reports he takes medication at 7pm and then feels jittery and off balance afterwards.   Person(s) Educated Patient;Caregiver(s)   Methods Explanation;Demonstration;Verbal cues;Handout;Tactile cues   Comprehension Verbalized understanding;Returned demonstration          PT Short Term Goals - 08/22/15 1308    PT SHORT TERM GOAL #1   Title Pt will be IND in  HEP to improve strength and balance. Target date: 08/22/15   Status Partially Met   PT SHORT TERM GOAL #2   Title Pt will perform TUG with LRAD in </=13.5sec. to decr. falls risk. Target date: 08/22/15   Status Not Met   PT SHORT TERM GOAL #3   Title Pt will amb. 300' over even terrain with LRAD at MOD I level to improve functional mobility. Target date: 08/22/15   Status Achieved   PT SHORT TERM GOAL #4   Title Pt will improve BERG score to >/=43/56 to decr. falls risk. Target date: 08/22/15   Status Achieved   PT SHORT TERM GOAL #5   Title Pt will improve gait speed wth LRAD to >/=2.31f/sec. to decr. falls risk. Target date: 08/22/15   Status Achieved           PT Long Term Goals - 08/01/15 1526    PT LONG TERM GOAL #1   Title Pt will verbalize understanding of falls prevention strategies to decr. risk of falls. Target date: 09/19/15   Status On-going   PT LONG TERM GOAL #2   Title Pt will amb. 500' with LRAD over even/uneven terrain at MOD I level to improve functional mobility. Target date: 09/19/15   Status On-going   PT LONG TERM GOAL #3   Title Pt will improve BERG score to >/=47/56 to decr. falls risk. Target date: 09/19/15   Status On-going   PT LONG TERM GOAL #4   Title Pt will improve gait speed to >/=2.646fsec. with LRAD to safely amb. in the community. Target date: 09/19/15   Status On-going   PT LONG TERM GOAL #5   Title Assess stairs and write goal if appropriate. Target date: 09/19/15   Status On-going               Plan - 08/28/15 1156    Clinical Impression Statement Pt demonstrated progress, as he was able to progress from 2 UE support to 1 UE support during sidestepping balance activity. Pt experienced incr. postural sway and LOB during balance activities on compliant surfaces with eyes closed, indicating decr. vestibular input. Pt's caregiver asked PT to call her later this afternoon. Pt would continue to benefit from skilled PT to improve safety during  functional mobility.   Rehab Potential Good   Clinical Impairments Affecting Rehab Potential co-morbidities   PT Frequency 2x / week   PT Duration 8 weeks   PT Treatment/Interventions ADLs/Self Care Home Management;Biofeedback;Canalith Repostioning;Electrical Stimulation;Balance training;Therapeutic exercise;Manual techniques;Vestibular;Therapeutic activities;Functional mobility training;Orthotic Fit/Training;Stair training;Gait training;Patient/family education;DME Instruction;Neuromuscular re-education   PT Next Visit Plan Continue  vestibular balance training, gait with SPC (with tripod attachment).    PT Home Exercise Plan OTAGO and postural strengthening   Consulted and Agree with Plan of Care Patient   Family Member Consulted pt's personal asst: [name]      Patient will benefit from skilled therapeutic intervention in order to improve the following deficits and impairments:  Abnormal gait, Decreased endurance, Postural dysfunction, Impaired flexibility, Decreased balance, Decreased mobility, Decreased knowledge of use of DME, Decreased strength  Visit Diagnosis: Other abnormalities of gait and mobility  Abnormal posture  Muscle weakness (generalized)     Problem List Patient Active Problem List   Diagnosis Date Noted  . Pleural effusion 01/23/2015  . Acute respiratory failure with hypoxia ([place]) 01/07/2015  . PAF (paroxysmal atrial fibrillation) ([place]) 01/07/2015  . Acute respiratory failure ([place]) 01/07/2015  . Shortness of breath 01/06/2015  . Patellar tendinitis of right knee 12/12/2014  . Adenocarcinoma of right lung, stage 1 ([place]) 12/11/2014  . Traumatic rotator cuff tear 11/07/2014  . Compression fracture of L1 lumbar vertebra (HCC) 11/07/2014  . Lumbar degenerative disc disease 09/11/2014  . Osteoarthritis of right knee 09/11/2014  . Chronic anticoagulation 11/22/2012  . DYSLIPIDEMIA 08/06/2008  . COLONIC POLYPS, ADENOMATOUS 07/17/2008  . Essential hypertension  07/17/2008  . Atrial fibrillation ([place]) 07/17/2008  . BRADYCARDIA 07/17/2008  . HEMORRHOIDS 07/17/2008  . Asthma 07/17/2008  . DIVERTICULOSIS, COLON 07/17/2008  . PALPITATIONS 07/17/2008    [name] 08/28/2015, 12:03 PM  [facility] [address] [place], [place], [address] Phone: [phone]   Fax:  [phone]  Name: [name] MRN: [id number] Date of Birth: 1925/02/07    Geoffry Paradise, PT,DPT 08/28/2015 12:03 PM Phone: [phone] Fax: [phone]

## 2015-08-28 NOTE — Patient Instructions (Signed)
Perform in a corner with chair in front of you OR at kitchen sink with chair behind you, make sure someone is with you:  Feet Apart (Compliant Surface) Varied Arm Positions - Eyes Closed    Stand on compliant surface: ___pillow/cushion_____ with feet shoulder width apart and arms at your side. Close eyes and visualize upright position. Hold__10__ seconds. Repeat __3__ times per session. Do __1__ sessions per day.  Copyright  VHI. All rights reserved.

## 2015-08-29 ENCOUNTER — Ambulatory Visit: Payer: Medicare Other

## 2015-08-29 DIAGNOSIS — Z6826 Body mass index (BMI) 26.0-26.9, adult: Secondary | ICD-10-CM | POA: Diagnosis not present

## 2015-08-29 DIAGNOSIS — R293 Abnormal posture: Secondary | ICD-10-CM | POA: Diagnosis not present

## 2015-08-29 DIAGNOSIS — R2689 Other abnormalities of gait and mobility: Secondary | ICD-10-CM

## 2015-08-29 DIAGNOSIS — I82403 Acute embolism and thrombosis of unspecified deep veins of lower extremity, bilateral: Secondary | ICD-10-CM | POA: Diagnosis not present

## 2015-08-29 DIAGNOSIS — Z7901 Long term (current) use of anticoagulants: Secondary | ICD-10-CM | POA: Diagnosis not present

## 2015-08-29 DIAGNOSIS — W19XXXA Unspecified fall, initial encounter: Secondary | ICD-10-CM | POA: Diagnosis not present

## 2015-08-29 DIAGNOSIS — M6281 Muscle weakness (generalized): Secondary | ICD-10-CM | POA: Diagnosis not present

## 2015-08-29 DIAGNOSIS — I480 Paroxysmal atrial fibrillation: Secondary | ICD-10-CM | POA: Diagnosis not present

## 2015-08-29 NOTE — Therapy (Signed)
[facility] [address] [place], [place], [address] Phone: [phone]   Fax:  [phone]  Physical Therapy Treatment  Patient Details  Name: [name] MRN: [id number] Date of Birth: 27-Dec-1924 Referring Provider: Dr. Brigitte Pulse  Encounter Date: 08/29/2015      PT End of Session - 08/29/15 1148    Visit Number 9   Number of Visits 17   Date for PT Re-Evaluation 09/23/15   Authorization Type G-code and progress note every 10th visit.   PT Start Time 1104   PT Stop Time 1145   PT Time Calculation (min) 41 min   Equipment Utilized During Treatment Gait belt   Activity Tolerance Patient tolerated treatment well   Behavior During Therapy WFL for tasks assessed/performed      Past Medical History  Diagnosis Date  . Bradycardia   . Palpitations   . Asthma   . Hemorrhoids   . Hx of adenomatous colonic polyps 02/1997  . Atrial fibrillation ([place])   . Hypertension   . Diverticulosis of colon   . COPD (chronic obstructive pulmonary disease) (HCC)     mild  . Bowel obstruction ([place])   . Arthritis     osteoarthritis  . Cancer ([place])     skin cancer  . 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 Adenocarcinoma of right lung, stage 1 ([place]) 12/11/2014    Past Surgical History  Procedure Laterality Date  . Colon resection    . Total hip arthroplasty Right   . Appendectomy    . Laminectomy      Right L4-L5 Laminectomy  . Cystoscopy    . Rotator cuff repair Right   . Umbilical hernia repair    . Video bronchoscopy with endobronchial navigation N/A 12/18/2014    Procedure: VIDEO BRONCHOSCOPY WITH ENDOBRONCHIAL NAVIGATION ;  Surgeon: Collene Gobble, MD;  Location: [facility];  Service: Thoracic;  Laterality: N/A;    There were no vitals filed for this visit.      Subjective Assessment - 08/29/15 1106    Subjective Pt denied changes since last visit.  Pt reports tailbone still hurts at night.    Patient is accompained by: Family  member  [name]   Pertinent History Stage I lung CA, COPD, asthma, HTN, Lx DDD, compression fx of L1 approx. 1 year ago, R knee OA (torn lateral meniscus), a-fib   Patient Stated Goals Build strength and balance, improve my situation   Currently in Pain? Yes   Pain Score 1    Pain Location Arm   Pain Orientation Right   Pain Descriptors / Indicators Sore   Pain Type Acute pain   Pain Onset 1 to 4 weeks ago   Pain Frequency Intermittent   Aggravating Factors  moving it   Pain Relieving Factors covering wound                         [facility] Adult PT Treatment/Exercise - 08/29/15 1108    Ambulation/Gait   Ambulation/Gait Yes   Ambulation/Gait Assistance 5: Supervision;4: Min guard   Ambulation/Gait Assistance Details Pt amb. while performing head turns, and performing cognitive tasks (counting backwards by 100 and naming foods by alphabetical order). Pt SOB after 345' and required rest break, SaO2 on room air: 92-95%, and HR: 92bpm after amb. and after seated rest break: HR: 85bpm and SaO2 room air: 97%. Pt then amb. outdoors, with cues to improve stride length, step length and  for proper wt. shifting over declines/inclines.   Ambulation Distance (Feet) 345 Feet  x2 and 115' indoors and 400' outdoors   Assistive device Straight cane   Gait Pattern Step-through pattern;Trunk flexed   Ambulation Surface Level;Indoor            PT Short Term Goals - 08/22/15 1308    PT SHORT TERM GOAL #1   Title Pt will be IND in HEP to improve strength and balance. Target date: 08/22/15   Status Partially Met   PT SHORT TERM GOAL #2   Title Pt will perform TUG with LRAD in </=13.5sec. to decr. falls risk. Target date: 08/22/15   Status Not Met   PT SHORT TERM GOAL #3   Title Pt will amb. 300' over even terrain with LRAD at MOD I level to improve functional mobility. Target date: 08/22/15   Status Achieved   PT SHORT TERM GOAL #4   Title Pt will improve BERG score to  >/=43/56 to decr. falls risk. Target date: 08/22/15   Status Achieved   PT SHORT TERM GOAL #5   Title Pt will improve gait speed wth LRAD to >/=2.77f/sec. to decr. falls risk. Target date: 08/22/15   Status Achieved           PT Long Term Goals - 08/01/15 1526    PT LONG TERM GOAL #1   Title Pt will verbalize understanding of falls prevention strategies to decr. risk of falls. Target date: 09/19/15   Status On-going   PT LONG TERM GOAL #2   Title Pt will amb. 500' with LRAD over even/uneven terrain at MOD I level to improve functional mobility. Target date: 09/19/15   Status On-going   PT LONG TERM GOAL #3   Title Pt will improve BERG score to >/=47/56 to decr. falls risk. Target date: 09/19/15   Status On-going   PT LONG TERM GOAL #4   Title Pt will improve gait speed to >/=2.619fsec. with LRAD to safely amb. in the community. Target date: 09/19/15   Status On-going   PT LONG TERM GOAL #5   Title Assess stairs and write goal if appropriate. Target date: 09/19/15   Status On-going               Plan - 08/29/15 1148    Clinical Impression Statement Pt demonstrated progress, as he was able to amb. outdoors over uneven and grassy terrain with LOB with cane. Pt did require seated rest breaks 2/2 fatigue and slight SOB. Pt noted to amb. at [name] while performing cognitive tasks. Continue with POC.   Rehab Potential Good   Clinical Impairments Affecting Rehab Potential co-morbidities   PT Frequency 2x / week   PT Duration 8 weeks   PT Treatment/Interventions ADLs/Self Care Home Management;Biofeedback;Canalith Repostioning;Electrical Stimulation;Balance training;Therapeutic exercise;Manual techniques;Vestibular;Therapeutic activities;Functional mobility training;Orthotic Fit/Training;Stair training;Gait training;Patient/family education;DME Instruction;Neuromuscular re-education   PT Next Visit Plan G-CODE. Continue vestibular balance training, gait with SPC (with tripod  attachment).    PT Home Exercise Plan OTAGO and postural strengthening   Consulted and Agree with Plan of Care Patient   Family Member Consulted pt's personal asst: KiJoelene Millin    Patient will benefit from skilled therapeutic intervention in order to improve the following deficits and impairments:  Abnormal gait, Decreased endurance, Postural dysfunction, Impaired flexibility, Decreased balance, Decreased mobility, Decreased knowledge of use of DME, Decreased strength  Visit Diagnosis: Other abnormalities of gait and mobility     Problem List Patient Active Problem  List   Diagnosis Date Noted  . Pleural effusion 01/23/2015  . Acute respiratory failure with hypoxia (HCC) 01/07/2015  . PAF (paroxysmal atrial fibrillation) (HCC) 01/07/2015  . Acute respiratory failure (HCC) 01/07/2015  . Shortness of breath 01/06/2015  . Patellar tendinitis of right knee 12/12/2014  . Adenocarcinoma of right lung, stage 1 (HCC) 12/11/2014  . Traumatic rotator cuff tear 11/07/2014  . Compression fracture of L1 lumbar vertebra (HCC) 11/07/2014  . Lumbar degenerative disc disease 09/11/2014  . Osteoarthritis of right knee 09/11/2014  . Chronic anticoagulation 11/22/2012  . DYSLIPIDEMIA 08/06/2008  . COLONIC POLYPS, ADENOMATOUS 07/17/2008  . Essential hypertension 07/17/2008  . Atrial fibrillation (HCC) 07/17/2008  . BRADYCARDIA 07/17/2008  . HEMORRHOIDS 07/17/2008  . Asthma 07/17/2008  . DIVERTICULOSIS, COLON 07/17/2008  . PALPITATIONS 07/17/2008    Miller,Jennifer L 08/29/2015, 11:49 AM  [facility] [address] [place], [place], [address] Phone: [phone]   Fax:  [phone]  Name: Tiburcio R Harker MRN: [id number] Date of Birth: 12/31/1924    Jennifer Miller, PT,DPT 08/29/2015 11:49 AM Phone: [phone] Fax: [phone]   

## 2015-09-02 DIAGNOSIS — J301 Allergic rhinitis due to pollen: Secondary | ICD-10-CM | POA: Diagnosis not present

## 2015-09-02 DIAGNOSIS — J3089 Other allergic rhinitis: Secondary | ICD-10-CM | POA: Diagnosis not present

## 2015-09-02 NOTE — Telephone Encounter (Signed)
Spoke with caregiver, Joelene Millin, per her request. She has been speaking with pt's dtr [facility]) and they are worried because pt has been forgetting information since the last fall. They feel his memory issues are getting worse, and he is sleeping more. She had a conversation about it yesterday and he did not deny memory issues but is trying to "cover it up".  Pt has MD appt 08/29/15 at 4:30pm to discuss memory issues. Pt is fearful of not being able to drive any longer. She just wanted to keep me informed.  Side note: Primary PT had a conversation with a PT Vinnie Level) at [facility] neuro who knows pt prior to this bout of PT, and she stated that he did not remember her from when she treated his wife (for extensive period of time). However, pt can now recall who the PT Vinnie Level) is.

## 2015-09-04 ENCOUNTER — Ambulatory Visit: Payer: Medicare Other

## 2015-09-04 DIAGNOSIS — M6281 Muscle weakness (generalized): Secondary | ICD-10-CM | POA: Diagnosis not present

## 2015-09-04 DIAGNOSIS — R293 Abnormal posture: Secondary | ICD-10-CM | POA: Diagnosis not present

## 2015-09-04 DIAGNOSIS — R2689 Other abnormalities of gait and mobility: Secondary | ICD-10-CM

## 2015-09-04 NOTE — Therapy (Addendum)
[facility] [address] [place], [place], [address] Phone: [phone]   Fax:  [phone]  Physical Therapy Treatment  Patient Details  Name: [name] MRN: [id number] Date of Birth: 03/08/1925 Referring Provider: Dr. Brigitte Pulse  Encounter Date: 09/04/2015      PT End of Session - 09/04/15 1206    Visit Number 10   Number of Visits 17   Date for PT Re-Evaluation 09/23/15   Authorization Type G-code and progress note every 10th visit.   PT Start Time 1100   PT Stop Time 1143   PT Time Calculation (min) 43 min   Equipment Utilized During Treatment Gait belt   Activity Tolerance Patient tolerated treatment well   Behavior During Therapy WFL for tasks assessed/performed      Past Medical History  Diagnosis Date  . Bradycardia   . Palpitations   . Asthma   . Hemorrhoids   . Hx of adenomatous colonic polyps 02/1997  . Atrial fibrillation ([place])   . Hypertension   . Diverticulosis of colon   . COPD (chronic obstructive pulmonary disease) (HCC)     mild  . Bowel obstruction ([place])   . Arthritis     osteoarthritis  . Cancer ([place])     skin cancer  . 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 Adenocarcinoma of right lung, stage 1 ([place]) 12/11/2014    Past Surgical History  Procedure Laterality Date  . Colon resection    . Total hip arthroplasty Right   . Appendectomy    . Laminectomy      Right L4-L5 Laminectomy  . Cystoscopy    . Rotator cuff repair Right   . Umbilical hernia repair    . Video bronchoscopy with endobronchial navigation N/A 12/18/2014    Procedure: VIDEO BRONCHOSCOPY WITH ENDOBRONCHIAL NAVIGATION ;  Surgeon: Collene Gobble, MD;  Location: [facility];  Service: Thoracic;  Laterality: N/A;    There were no vitals filed for this visit.      Subjective Assessment - 09/04/15 1103    Subjective Pt denied falls since last visit. Pt had an appt with Dr. Brigitte Pulse on Friday and they discussed anti-coagulants and felt  that staying on meds outweighed the risks.  Pt's dtr went with MD appt.  Pt reported he still experiences a little sacrum soreness in the morning which began after the fall, pt believes the MD checked it but didn't order imaging.    Patient is accompained by: Family member  [name]   Pertinent History Stage I lung CA, COPD, asthma, HTN, Lx DDD, compression fx of L1 approx. 1 year ago, R knee OA (torn lateral meniscus), a-fib   Patient Stated Goals Build strength and balance, improve my situation   Currently in Pain? No/denies                         [facility] Adult PT Treatment/Exercise - 09/04/15 1107    Ambulation/Gait   Ambulation/Gait Yes   Ambulation/Gait Assistance 6: Modified independent (Device/Increase time);5: Supervision   Ambulation/Gait Assistance Details MOD I level with RW and S with cane. Cues to improve upright posture and stride length.   Ambulation Distance (Feet) 200 Feet   Assistive device Straight cane;Rolling walker   Gait Pattern Step-through pattern;Trunk flexed   Ambulation Surface Level;Indoor   Gait velocity 2.78f/sec with RW and 2.743fsec with cane with tripod attachment   Standardized Balance Assessment   Standardized Balance Assessment Berg Balance  Test;Timed Up and Go Test   Berg Balance Test   Sit to Stand Able to stand without using hands and stabilize independently   Standing Unsupported Able to stand safely 2 minutes   Sitting with Back Unsupported but Feet Supported on Floor or Stool Able to sit safely and securely 2 minutes   Stand to Sit Sits safely with minimal use of hands   Transfers Able to transfer safely, definite need of hands  for sit to stand from chair with armrests   Standing Unsupported with Eyes Closed Able to stand 10 seconds with supervision   Standing Ubsupported with Feet Together Able to place feet together independently and stand 1 minute safely   From Standing, Reach Forward with Outstretched Arm Can  reach forward >12 cm safely (5")   From Standing Position, Pick up Object from Floor Able to pick up shoe safely and easily   From Standing Position, Turn to Look Behind Over each Shoulder Looks behind from both sides and weight shifts well   Turn 360 Degrees Able to turn 360 degrees safely one side only in 4 seconds or less  R side   Standing Unsupported, Alternately Place Feet on Step/Stool Able to stand independently and complete 8 steps >20 seconds   Standing Unsupported, One Foot in Front Able to take small step independently and hold 30 seconds   Standing on One Leg Able to lift leg independently and hold equal to or more than 3 seconds   Total Score 47   Timed Up and Go Test   TUG Normal TUG   Normal TUG (seconds) 12.8  with RW and 11.8sec. with SPC with tripod attachment                PT Education - 09/04/15 1206    Education provided Yes   Education Details PT discussed outcome measure progress and results   Person(s) Educated Patient;Caregiver(s)   Methods Explanation   Comprehension Verbalized understanding          PT Short Term Goals - 08/22/15 1308    PT SHORT TERM GOAL #1   Title Pt will be IND in HEP to improve strength and balance. Target date: 08/22/15   Status Partially Met   PT SHORT TERM GOAL #2   Title Pt will perform TUG with LRAD in </=13.5sec. to decr. falls risk. Target date: 08/22/15   Status Not Met   PT SHORT TERM GOAL #3   Title Pt will amb. 300' over even terrain with LRAD at MOD I level to improve functional mobility. Target date: 08/22/15   Status Achieved   PT SHORT TERM GOAL #4   Title Pt will improve BERG score to >/=43/56 to decr. falls risk. Target date: 08/22/15   Status Achieved   PT SHORT TERM GOAL #5   Title Pt will improve gait speed wth LRAD to >/=2.15f/sec. to decr. falls risk. Target date: 08/22/15   Status Achieved           PT Long Term Goals - 08/01/15 1526    PT LONG TERM GOAL #1   Title Pt will verbalize  understanding of falls prevention strategies to decr. risk of falls. Target date: 09/19/15   Status On-going   PT LONG TERM GOAL #2   Title Pt will amb. 500' with LRAD over even/uneven terrain at MOD I level to improve functional mobility. Target date: 09/19/15   Status On-going   PT LONG TERM GOAL #3   Title Pt  will improve BERG score to >/=47/56 to decr. falls risk. Target date: 09/19/15   Status On-going   PT LONG TERM GOAL #4   Title Pt will improve gait speed to >/=2.46f/sec. with LRAD to safely amb. in the community. Target date: 09/19/15   Status On-going   PT LONG TERM GOAL #5   Title Assess stairs and write goal if appropriate. Target date: 09/19/15   Status On-going               Plan - 0Jun 23, 20171206    Clinical Impression Statement Pt demonstrated progress, as his gait speed, TUG time and BERG score all improved. Pt's BERG score still indicates pt is at risk for falls. PT discussed the importance of performing tasks at night (taking pills and allergy treatment) seated, as pt reported that is when he feels most off balance; pt agreeable. Continue with POC.   Rehab Potential Good   Clinical Impairments Affecting Rehab Potential co-morbidities   PT Frequency 2x / week   PT Duration 8 weeks   PT Treatment/Interventions ADLs/Self Care Home Management;Biofeedback;Canalith Repostioning;Electrical Stimulation;Balance training;Therapeutic exercise;Manual techniques;Vestibular;Therapeutic activities;Functional mobility training;Orthotic Fit/Training;Stair training;Gait training;Patient/family education;DME Instruction;Neuromuscular re-education   PT Next Visit Plan Continue vestibular balance training, gait with SPC (with tripod attachment).    PT Home Exercise Plan OTAGO and postural strengthening   Consulted and Agree with Plan of Care Patient   Family Member Consulted pt's personal asst: KJoelene Millin     Patient will benefit from skilled therapeutic intervention in order to improve  the following deficits and impairments:  Abnormal gait, Decreased endurance, Postural dysfunction, Impaired flexibility, Decreased balance, Decreased mobility, Decreased knowledge of use of DME, Decreased strength  Visit Diagnosis: Other abnormalities of gait and mobility  Muscle weakness (generalized)       G-Codes - 02017/06/23103/12/1206   Functional Assessment Tool Used BERG: 47/56; gait speed with RW: 2.366fsec. and 2.713fec with cane; TUG with RW:12.8sec. and 11.8sec. with cane   Functional Limitation Mobility: Walking and moving around   Mobility: Walking and Moving Around Current Status ([id number] least 20 percent but less than 40 percent impaired, limited or restricted   Mobility: Walking and Moving Around Goal Status ([id number] least 1 percent but less than 20 percent impaired, limited or restricted      Problem List Patient Active Problem List   Diagnosis Date Noted  . Pleural effusion 01/23/2015  . Acute respiratory failure with hypoxia (HCCSeven Mile Ford0/01/2015  . PAF (paroxysmal atrial fibrillation) (HCCClarksville City0/01/2015  . Acute respiratory failure (HCCCow [place]0/01/2015  . Shortness of breath 01/06/2015  . Patellar tendinitis of right knee 12/12/2014  . Adenocarcinoma of right lung, stage 1 (HCCMoorefield9/14/2016  . Traumatic rotator cuff tear 11/07/2014  . Compression fracture of L1 lumbar vertebra (HCC) 11/07/2014  . Lumbar degenerative disc disease 09/11/2014  . Osteoarthritis of right knee 09/11/2014  . Chronic anticoagulation 11/22/2012  . DYSLIPIDEMIA 08/06/2008  . COLONIC POLYPS, ADENOMATOUS 07/17/2008  . Essential hypertension 07/17/2008  . Atrial fibrillation (HCCLake Barrington4/21/2010  . BRADYCARDIA 07/17/2008  . HEMORRHOIDS 07/17/2008  . Asthma 07/17/2008  . DIVERTICULOSIS, COLON 07/17/2008  . PALPITATIONS 07/17/2008    Donjuan Robison L 6/823-Jun-20172:09 PM  ConEl[address] Heights[address]iFox Lake HillseReinbeck[place]one:  [phone]:  [phone]: HarFORD PEDDIEN: [id number] of Birth: [birth year]hysical Therapy Progress Note  Dates of Reporting Period: 07/25/15 to 6/806-23-2017bjective Reports of Subjective  Statement: Pt reports balance is improving and his BERG score has incr.  Objective Measurements: Please see above.  Goal Update:      PT Short Term Goals - 08/22/15 1308    PT SHORT TERM GOAL #1   Title Pt will be IND in HEP to improve strength and balance. Target date: 08/22/15   Status Partially Met   PT SHORT TERM GOAL #2   Title Pt will perform TUG with LRAD in </=13.5sec. to decr. falls risk. Target date: 08/22/15   Status Not Met   PT SHORT TERM GOAL #3   Title Pt will amb. 300' over even terrain with LRAD at MOD I level to improve functional mobility. Target date: 08/22/15   Status Achieved   PT SHORT TERM GOAL #4   Title Pt will improve BERG score to >/=43/56 to decr. falls risk. Target date: 08/22/15   Status Achieved   PT SHORT TERM GOAL #5   Title Pt will improve gait speed wth LRAD to >/=2.53f/sec. to decr. falls risk. Target date: 08/22/15   Status Achieved       Plan: Continue gait training, strengthening and balance training.  Reason Skilled Services are Required: To improve safety during functional mobility.     JGeoffry Paradise PT,DPT 09/04/2015 12:09 PM Phone: [phone]: [phone]

## 2015-09-05 ENCOUNTER — Ambulatory Visit: Payer: Medicare Other

## 2015-09-05 DIAGNOSIS — R293 Abnormal posture: Secondary | ICD-10-CM | POA: Diagnosis not present

## 2015-09-05 DIAGNOSIS — M6281 Muscle weakness (generalized): Secondary | ICD-10-CM

## 2015-09-05 DIAGNOSIS — R2689 Other abnormalities of gait and mobility: Secondary | ICD-10-CM | POA: Diagnosis not present

## 2015-09-05 NOTE — Therapy (Signed)
[facility] [address] [place], [place], [address] Phone: [phone]   Fax:  [phone]  Physical Therapy Treatment  Patient Details  Name: [name] MRN: [id number] Date of Birth: 02/12/1925 Referring Provider: Dr. Brigitte Pulse  Encounter Date: 09/05/2015      PT End of Session - 09/05/15 1230    Visit Number 11   Number of Visits 17   Date for PT Re-Evaluation 09/23/15   Authorization Type G-code and progress note every 10th visit.   PT Start Time 1104   PT Stop Time 1145   PT Time Calculation (min) 41 min   Equipment Utilized During Treatment Gait belt   Activity Tolerance Patient tolerated treatment well   Behavior During Therapy WFL for tasks assessed/performed      Past Medical History  Diagnosis Date  . Bradycardia   . Palpitations   . Asthma   . Hemorrhoids   . Hx of adenomatous colonic polyps 02/1997  . Atrial fibrillation ([place])   . Hypertension   . Diverticulosis of colon   . COPD (chronic obstructive pulmonary disease) (HCC)     mild  . Bowel obstruction ([place])   . Arthritis     osteoarthritis  . Cancer ([place])     skin cancer  . 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 Adenocarcinoma of right lung, stage 1 ([place]) 12/11/2014    Past Surgical History  Procedure Laterality Date  . Colon resection    . Total hip arthroplasty Right   . Appendectomy    . Laminectomy      Right L4-L5 Laminectomy  . Cystoscopy    . Rotator cuff repair Right   . Umbilical hernia repair    . Video bronchoscopy with endobronchial navigation N/A 12/18/2014    Procedure: VIDEO BRONCHOSCOPY WITH ENDOBRONCHIAL NAVIGATION ;  Surgeon: Collene Gobble, MD;  Location: [facility];  Service: Thoracic;  Laterality: N/A;    There were no vitals filed for this visit.      Subjective Assessment - 09/05/15 1107    Subjective Pt denied falls or changes since last visit.    Patient is accompained by: Family member  [name]   Pertinent History Stage I lung CA, COPD, asthma, HTN, Lx DDD, compression fx of L1 approx. 1 year ago, R knee OA (torn lateral meniscus), a-fib   Patient Stated Goals Build strength and balance, improve my situation   Currently in Pain? No/denies                         [facility] Adult PT Treatment/Exercise - 09/05/15 1107    Ambulation/Gait   Ambulation/Gait Yes   Ambulation/Gait Assistance 5: Supervision;4: Min guard;6: Modified independent (Device/Increase time)   Ambulation/Gait Assistance Details Min guard to S during amb. with cane, cues for sequencing and upright posture.  Pt required seated and standing rest breaks during amb. 2/2 fatigue. MOD I level with RW   Ambulation Distance (Feet) 230 Feet  indoors and 200, 300' outdoors with cane 50' with RW   Assistive device Straight cane;Rolling walker   Gait Pattern Step-through pattern;Trunk flexed   Ambulation Surface Level;Indoor   High Level Balance   High Level Balance Activities Head turns;Other (comment)  forward amb.   High Level Balance Comments Performed in // bars on compliant surface with min guard for safety: 0-1 UE support. All activities performed 4x7' (head turns/nods and forward amb). Cues for technique and to improve wt. shifting.  Balance Exercises - 09/05/15 1229    Balance Exercises: Standing   Standing Eyes Opened Wide (BOA);Foam/compliant surface;30 secs;Other (comment);Head turns;5 reps;1 rep   Standing Eyes Closed Wide (BOA);Foam/compliant surface;10 secs;Other reps (comment);2 reps   Overall Comments --  No UE support and min guard to min A for safety, in // bars.           PT Education - 09/04/15 1206    Education provided Yes   Education Details PT discussed outcome measure progress and results   Person(s) Educated Patient;Caregiver(s)   Methods Explanation   Comprehension Verbalized understanding          PT Short Term Goals - 08/22/15 1308    PT SHORT TERM  GOAL #1   Title Pt will be IND in HEP to improve strength and balance. Target date: 08/22/15   Status Partially Met   PT SHORT TERM GOAL #2   Title Pt will perform TUG with LRAD in </=13.5sec. to decr. falls risk. Target date: 08/22/15   Status Not Met   PT SHORT TERM GOAL #3   Title Pt will amb. 300' over even terrain with LRAD at MOD I level to improve functional mobility. Target date: 08/22/15   Status Achieved   PT SHORT TERM GOAL #4   Title Pt will improve BERG score to >/=43/56 to decr. falls risk. Target date: 08/22/15   Status Achieved   PT SHORT TERM GOAL #5   Title Pt will improve gait speed wth LRAD to >/=2.68f/sec. to decr. falls risk. Target date: 08/22/15   Status Achieved           PT Long Term Goals - 08/01/15 1526    PT LONG TERM GOAL #1   Title Pt will verbalize understanding of falls prevention strategies to decr. risk of falls. Target date: 09/19/15   Status On-going   PT LONG TERM GOAL #2   Title Pt will amb. 500' with LRAD over even/uneven terrain at MOD I level to improve functional mobility. Target date: 09/19/15   Status On-going   PT LONG TERM GOAL #3   Title Pt will improve BERG score to >/=47/56 to decr. falls risk. Target date: 09/19/15   Status On-going   PT LONG TERM GOAL #4   Title Pt will improve gait speed to >/=2.61fsec. with LRAD to safely amb. in the community. Target date: 09/19/15   Status On-going   PT LONG TERM GOAL #5   Title Assess stairs and write goal if appropriate. Target date: 09/19/15   Status On-going               Plan - 09/05/15 1230    Clinical Impression Statement Pt demonstrated progress, as he was able to amb. longer distances with cane/tripod attachment. Pt still limited by decr. endurance as he required seated and standing rest breaks during session. Pt continues to experience incr. postural sway during activities that challenge vestibular system (standing on compliant surfaces, with head turns or eyes closed). Pt  would continue to benefit from skilled PT to improve safety during functional mobility.    Rehab Potential Good   Clinical Impairments Affecting Rehab Potential co-morbidities   PT Frequency 2x / week   PT Duration 8 weeks   PT Treatment/Interventions ADLs/Self Care Home Management;Biofeedback;Canalith Repostioning;Electrical Stimulation;Balance training;Therapeutic exercise;Manual techniques;Vestibular;Therapeutic activities;Functional mobility training;Orthotic Fit/Training;Stair training;Gait training;Patient/family education;DME Instruction;Neuromuscular re-education   PT Next Visit Plan Continue vestibular balance training, gait with SPC (with tripod attachment).  Add vestibular HEP if pt  is able to perform with caregiver or family for safety.   PT Home Exercise Plan OTAGO and postural strengthening   Consulted and Agree with Plan of Care Patient   Family Member Consulted pt's personal asst: Joelene Millin      Patient will benefit from skilled therapeutic intervention in order to improve the following deficits and impairments:  Abnormal gait, Decreased endurance, Postural dysfunction, Impaired flexibility, Decreased balance, Decreased mobility, Decreased knowledge of use of DME, Decreased strength  Visit Diagnosis: Other abnormalities of gait and mobility  Muscle weakness (generalized)       G-Codes - 09/26/15 06-01-1206    Functional Assessment Tool Used BERG: 47/56; gait speed with RW: 2.70f/sec. and 2.787fsec with cane; TUG with RW:12.8sec. and 11.8sec. with cane   Functional Limitation Mobility: Walking and moving around   Mobility: Walking and Moving Around Current Status ([phone] least 20 percent but less than 40 percent impaired, limited or restricted   Mobility: Walking and Moving Around Goal Status ([phone] least 1 percent but less than 20 percent impaired, limited or restricted      Problem List Patient Active Problem List   Diagnosis Date Noted  . Pleural effusion  01/23/2015  . Acute respiratory failure with hypoxia (HCBreckenridge10/01/2015  . PAF (paroxysmal atrial fibrillation) (HCVirginia10/01/2015  . Acute respiratory failure (HCWinston10/01/2015  . Shortness of breath 01/06/2015  . Patellar tendinitis of right knee 12/12/2014  . Adenocarcinoma of right lung, stage 1 (HCFort Green Springs09/14/2016  . Traumatic rotator cuff tear 11/07/2014  . Compression fracture of L1 lumbar vertebra (HCC) 11/07/2014  . Lumbar degenerative disc disease 09/11/2014  . Osteoarthritis of right knee 09/11/2014  . Chronic anticoagulation 11/22/2012  . DYSLIPIDEMIA 08/06/2008  . COLONIC POLYPS, ADENOMATOUS 07/17/2008  . Essential hypertension 07/17/2008  . Atrial fibrillation (HCGolden04/21/2010  . BRADYCARDIA 07/17/2008  . HEMORRHOIDS 07/17/2008  . Asthma 07/17/2008  . DIVERTICULOSIS, COLON 07/17/2008  . PALPITATIONS 07/17/2008    Makeba Delcastillo L 09/05/2015, 12:33 PM  CoEagle Grove[address]uWatkinsvillerWestmereNCAlaska[address]hone: [phone] Fax:  [phone]: HaRAMOND DARNELLRN: [id number] of Birth: 7/11-30-1926  JeGeoffry ParadisePT,DPT 09/05/2015 12:33 PM Phone: [phone]: [phone]

## 2015-09-09 DIAGNOSIS — J301 Allergic rhinitis due to pollen: Secondary | ICD-10-CM | POA: Diagnosis not present

## 2015-09-09 DIAGNOSIS — J3089 Other allergic rhinitis: Secondary | ICD-10-CM | POA: Diagnosis not present

## 2015-09-11 ENCOUNTER — Ambulatory Visit: Payer: Medicare Other

## 2015-09-11 DIAGNOSIS — R2689 Other abnormalities of gait and mobility: Secondary | ICD-10-CM

## 2015-09-11 DIAGNOSIS — M6281 Muscle weakness (generalized): Secondary | ICD-10-CM | POA: Diagnosis not present

## 2015-09-11 DIAGNOSIS — R293 Abnormal posture: Secondary | ICD-10-CM | POA: Diagnosis not present

## 2015-09-11 NOTE — Therapy (Signed)
[facility] [address] [place], [place], [address] Phone: [phone]   Fax:  [phone]  Physical Therapy Treatment  Patient Details  Name: [name] MRN: [id number] Date of Birth: 02/03/1925 Referring Provider: Dr. Brigitte Pulse  Encounter Date: 09/11/2015      PT End of Session - 09/11/15 1150    Visit Number 12   Number of Visits 17   Date for PT Re-Evaluation 09/23/15   Authorization Type G-code and progress note every 10th visit.   PT Start Time [phone]   PT Stop Time 1013   PT Time Calculation (min) 40 min   Equipment Utilized During Treatment Gait belt   Activity Tolerance Patient limited by pain  required rest breaks 2/2 LBP.   Behavior During Therapy [facility] for tasks assessed/performed      Past Medical History  Diagnosis Date  . Bradycardia   . Palpitations   . Asthma   . Hemorrhoids   . Hx of adenomatous colonic polyps 02/1997  . Atrial fibrillation ([place])   . Hypertension   . Diverticulosis of colon   . COPD (chronic obstructive pulmonary disease) (HCC)     mild  . Bowel obstruction ([place])   . Arthritis     osteoarthritis  . Cancer ([place])     skin cancer  . 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[place])   . Adenocarcinoma of right lung, stage 1 ([place]) 12/11/2014    Past Surgical History  Procedure Laterality Date  . Colon resection    . Total hip arthroplasty Right   . Appendectomy    . Laminectomy      Right L4-L5 Laminectomy  . Cystoscopy    . Rotator cuff repair Right   . Umbilical hernia repair    . Video bronchoscopy with endobronchial navigation N/A 12/18/2014    Procedure: VIDEO BRONCHOSCOPY WITH ENDOBRONCHIAL NAVIGATION ;  Surgeon: Collene Gobble, MD;  Location: [facility];  Service: Thoracic;  Laterality: N/A;    There were no vitals filed for this visit.      Subjective Assessment - 09/11/15 0935    Subjective Pt denied falls or changes since last visit. Pt denied pain at rest but reported chronic R knee  and LBP with incr. activity.   Patient is accompained by: Family member  [name]   Pertinent History Stage I lung CA, COPD, asthma, HTN, Lx DDD, compression fx of L1 approx. 1 year ago, R knee OA (torn lateral meniscus), a-fib   Patient Stated Goals Build strength and balance, improve my situation   Currently in Pain? No/denies                         [facility] Adult PT Treatment/Exercise - 09/11/15 0952    Ambulation/Gait   Ambulation/Gait Yes   Ambulation/Gait Assistance 4: Min guard   Ambulation/Gait Assistance Details Pt amb. while performing head turns/nods with 2 LOB which pt self corrected. Min guard with cane. Pt required standing and seated rest breaks 2/2 4/10 back pain while amb. Pt reported pain decr. with rest (2/10) and amb. with RW.   Ambulation Distance (Feet) 345 Feet  and 75' indoors and 200, 150' outdoors   Assistive device Straight cane   Gait Pattern Step-through pattern;Trunk flexed   Ambulation Surface Level;Indoor;Unlevel;Outdoor;Paved             Balance Exercises - 09/11/15 1149    Balance Exercises: Standing   Standing Eyes Opened Wide (BOA);Foam/compliant surface;30 secs;Other (comment);Head  turns;5 reps;3 reps;Narrow base of support (BOS)   Standing Eyes Closed Wide (BOA);Foam/compliant surface;10 secs;Other reps (comment);30 secs;Narrow base of support (BOS);Head turns;3 reps   Other Standing Exercises perfomed in corner with chair in front of pt for safety, cues for technique and min guard to S for safety. Please see pt instructions for balance exercises added to HEP.           PT Education - 09/11/15 1150    Education provided Yes   Education Details PT reviewed previous and new balance HEP. PT reiterated the importance of performing with caregiver present for safety.   Person(s) Educated Patient;Caregiver(s)   Methods Explanation;Demonstration;Verbal cues;Handout   Comprehension Returned demonstration;Verbalized  understanding          PT Short Term Goals - 08/22/15 1308    PT SHORT TERM GOAL #1   Title Pt will be IND in HEP to improve strength and balance. Target date: 08/22/15   Status Partially Met   PT SHORT TERM GOAL #2   Title Pt will perform TUG with LRAD in </=13.5sec. to decr. falls risk. Target date: 08/22/15   Status Not Met   PT SHORT TERM GOAL #3   Title Pt will amb. 300' over even terrain with LRAD at MOD I level to improve functional mobility. Target date: 08/22/15   Status Achieved   PT SHORT TERM GOAL #4   Title Pt will improve BERG score to >/=43/56 to decr. falls risk. Target date: 08/22/15   Status Achieved   PT SHORT TERM GOAL #5   Title Pt will improve gait speed wth LRAD to >/=2.57f/sec. to decr. falls risk. Target date: 08/22/15   Status Achieved           PT Long Term Goals - 08/01/15 1526    PT LONG TERM GOAL #1   Title Pt will verbalize understanding of falls prevention strategies to decr. risk of falls. Target date: 09/19/15   Status On-going   PT LONG TERM GOAL #2   Title Pt will amb. 500' with LRAD over even/uneven terrain at MOD I level to improve functional mobility. Target date: 09/19/15   Status On-going   PT LONG TERM GOAL #3   Title Pt will improve BERG score to >/=47/56 to decr. falls risk. Target date: 09/19/15   Status On-going   PT LONG TERM GOAL #4   Title Pt will improve gait speed to >/=2.61fsec. with LRAD to safely amb. in the community. Target date: 09/19/15   Status On-going   PT LONG TERM GOAL #5   Title Assess stairs and write goal if appropriate. Target date: 09/19/15   Status On-going               Plan - 09/11/15 1152    Clinical Impression Statement Pt continues to experience incr. postural sway and LOB during balance and gait activities which require incr. vestibular input. Therefore, PT added balance exercises to pt's HEP in order to improve vestibular system input. Pt required incr. rest breaks today 2/2 chronic LBP.  Continue with POC.   Rehab Potential Good   Clinical Impairments Affecting Rehab Potential co-morbidities   PT Frequency 2x / week   PT Duration 8 weeks   PT Treatment/Interventions ADLs/Self Care Home Management;Biofeedback;Canalith Repostioning;Electrical Stimulation;Balance training;Therapeutic exercise;Manual techniques;Vestibular;Therapeutic activities;Functional mobility training;Orthotic Fit/Training;Stair training;Gait training;Patient/family education;DME Instruction;Neuromuscular re-education   PT Next Visit Plan Continue vestibular balance training, gait with SPC (with tripod attachment).    PT Home Exercise Plan OTAGO and postural  strengthening   Consulted and Agree with Plan of Care Patient   Family Member Consulted pt's personal asst: Joelene Millin      Patient will benefit from skilled therapeutic intervention in order to improve the following deficits and impairments:  Abnormal gait, Decreased endurance, Postural dysfunction, Impaired flexibility, Decreased balance, Decreased mobility, Decreased knowledge of use of DME, Decreased strength  Visit Diagnosis: Other abnormalities of gait and mobility     Problem List Patient Active Problem List   Diagnosis Date Noted  . Pleural effusion 01/23/2015  . Acute respiratory failure with hypoxia ([place]) 01/07/2015  . PAF (paroxysmal atrial fibrillation) ([place]) 01/07/2015  . Acute respiratory failure ([place]) 01/07/2015  . Shortness of breath 01/06/2015  . Patellar tendinitis of right knee 12/12/2014  . Adenocarcinoma of right lung, stage 1 ([place]) 12/11/2014  . Traumatic rotator cuff tear 11/07/2014  . Compression fracture of L1 lumbar vertebra (HCC) 11/07/2014  . Lumbar degenerative disc disease 09/11/2014  . Osteoarthritis of right knee 09/11/2014  . Chronic anticoagulation 11/22/2012  . DYSLIPIDEMIA 08/06/2008  . COLONIC POLYPS, ADENOMATOUS 07/17/2008  . Essential hypertension 07/17/2008  . Atrial fibrillation ([place]) 07/17/2008  .  BRADYCARDIA 07/17/2008  . HEMORRHOIDS 07/17/2008  . Asthma 07/17/2008  . DIVERTICULOSIS, COLON 07/17/2008  . PALPITATIONS 07/17/2008    Nasirah Sachs L 09/11/2015, 11:54 AM  [facility] [address] [place], [place], [address] Phone: [phone]   Fax:  [phone]  Name: REMBERT BROWE MRN: [id number] Date of Birth: 1925-01-29    Geoffry Paradise, PT,DPT 09/11/2015 11:54 AM Phone: [phone] Fax: [phone]

## 2015-09-11 NOTE — Patient Instructions (Signed)
Perform in a corner with a chair in front of you for safety OR perform at kitchen sink with chair behind you. Perform with somebody there with you for safety.  Feet Apart (Compliant Surface) Varied Arm Positions - Eyes Closed    Stand on compliant surface: __pillow/cushion______ with feet shoulder width apart and arms at your side. Close eyes and visualize upright position. Hold__10__ seconds. Repeat __3__ times per session. Do __1__ sessions per day.  Copyright  VHI. All rights reserved.  Feet Together (Compliant Surface) Head Motion - Eyes Open    With eyes open, standing on compliant surface: __pillow/cushion______, feet together, move head slowly: up and down 5 times and side to side 5 times. Repeat __3__ times per session. Do __1__ sessions per day.  Copyright  VHI. All rights reserved.

## 2015-09-12 ENCOUNTER — Ambulatory Visit: Payer: Medicare Other

## 2015-09-17 ENCOUNTER — Other Ambulatory Visit (HOSPITAL_COMMUNITY): Payer: Self-pay | Admitting: Internal Medicine

## 2015-09-17 DIAGNOSIS — C3412 Malignant neoplasm of upper lobe, left bronchus or lung: Secondary | ICD-10-CM | POA: Diagnosis not present

## 2015-09-17 DIAGNOSIS — J301 Allergic rhinitis due to pollen: Secondary | ICD-10-CM | POA: Diagnosis not present

## 2015-09-17 DIAGNOSIS — R7301 Impaired fasting glucose: Secondary | ICD-10-CM | POA: Diagnosis not present

## 2015-09-17 DIAGNOSIS — H6121 Impacted cerumen, right ear: Secondary | ICD-10-CM | POA: Diagnosis not present

## 2015-09-17 DIAGNOSIS — Z7901 Long term (current) use of anticoagulants: Secondary | ICD-10-CM | POA: Diagnosis not present

## 2015-09-17 DIAGNOSIS — I10 Essential (primary) hypertension: Secondary | ICD-10-CM | POA: Diagnosis not present

## 2015-09-17 DIAGNOSIS — J4530 Mild persistent asthma, uncomplicated: Secondary | ICD-10-CM | POA: Diagnosis not present

## 2015-09-17 DIAGNOSIS — I480 Paroxysmal atrial fibrillation: Secondary | ICD-10-CM | POA: Diagnosis not present

## 2015-09-17 DIAGNOSIS — R2689 Other abnormalities of gait and mobility: Secondary | ICD-10-CM | POA: Diagnosis not present

## 2015-09-17 DIAGNOSIS — Z6826 Body mass index (BMI) 26.0-26.9, adult: Secondary | ICD-10-CM | POA: Diagnosis not present

## 2015-09-17 DIAGNOSIS — J3089 Other allergic rhinitis: Secondary | ICD-10-CM | POA: Diagnosis not present

## 2015-09-18 ENCOUNTER — Ambulatory Visit: Payer: Medicare Other

## 2015-09-18 DIAGNOSIS — R2689 Other abnormalities of gait and mobility: Secondary | ICD-10-CM

## 2015-09-18 DIAGNOSIS — M6281 Muscle weakness (generalized): Secondary | ICD-10-CM

## 2015-09-18 DIAGNOSIS — R293 Abnormal posture: Secondary | ICD-10-CM | POA: Diagnosis not present

## 2015-09-18 NOTE — Therapy (Signed)
[facility] [address] [place], [place], [address] Phone: [phone]   Fax:  [phone]  Physical Therapy Treatment  Patient Details  Name: [name] MRN: [id number] Date of Birth: 06-26-1924 Referring Provider: Dr. Brigitte Pulse  Encounter Date: 09/18/2015      PT End of Session - 09/18/15 1156    Visit Number 13   Number of Visits 17   Date for PT Re-Evaluation 09/23/15   Authorization Type G-code and progress note every 10th visit.   PT Start Time 1101   PT Stop Time 1143   PT Time Calculation (min) 42 min   Equipment Utilized During Treatment --  SOT harness   Activity Tolerance Patient limited by pain   Behavior During Therapy [facility] for tasks assessed/performed      Past Medical History  Diagnosis Date  . Bradycardia   . Palpitations   . Asthma   . Hemorrhoids   . Hx of adenomatous colonic polyps 02/1997  . Atrial fibrillation ([place])   . Hypertension   . Diverticulosis of colon   . COPD (chronic obstructive pulmonary disease) (HCC)     mild  . Bowel obstruction ([place])   . Arthritis     osteoarthritis  . Cancer ([place])     skin cancer  . 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 Adenocarcinoma of right lung, stage 1 ([place]) 12/11/2014    Past Surgical History  Procedure Laterality Date  . Colon resection    . Total hip arthroplasty Right   . Appendectomy    . Laminectomy      Right L4-L5 Laminectomy  . Cystoscopy    . Rotator cuff repair Right   . Umbilical hernia repair    . Video bronchoscopy with endobronchial navigation N/A 12/18/2014    Procedure: VIDEO BRONCHOSCOPY WITH ENDOBRONCHIAL NAVIGATION ;  Surgeon: Collene Gobble, MD;  Location: [facility];  Service: Thoracic;  Laterality: N/A;    There were no vitals filed for this visit.      Subjective Assessment - 09/18/15 1104    Subjective Pt reported he missed last session 2/2 LBP and leg pain on Tuesday but feels better today. Pt amb. back to gym with cane  today vs. RW and no LOB.   Patient is accompained by: Family member  [name]   Pertinent History Stage I lung CA, COPD, asthma, HTN, Lx DDD, compression fx of L1 approx. 1 year ago, R knee OA (torn lateral meniscus), a-fib   Patient Stated Goals Build strength and balance, improve my situation   Pain Score --  0.5/10   Pain Location Back   Pain Orientation Lower   Pain Descriptors / Indicators Aching;Dull   Pain Type Chronic pain   Pain Onset More than a month ago   Pain Frequency Intermittent   Aggravating Factors  stand for prolonged periods and amb.   Pain Relieving Factors sitting down, lying down                Neuro re-ed: PT reviewed pt's vestibular/balance HEP and provided cues for technique and frequency.  Neuro re-ed: sensory organization test performed with following results: Conditions: 1:  All 3 trials WNL 2:  All 3 trials WNL 3:  All 3 trials WNL 4: All 3 trials WNL 5: 1 fall and 2 trials WNL 6: 1 fall and 2 trials WNL Composite score:  66, WNL Sensory Analysis Som: Below normal limits ~76 Vis: WNL~95 Vest: Below normal limits ~35 Pref:  WNL Strategy analysis:     Good use of ankle/hip strategy except for decr. Ankle strategy during conditions 5 and 6 (which require incr. Vestibular input)  COG alignment:      Anterior bias, with incr. Wt. Shifting onto LLE likely 2/2 R knee pain. Pt required frequent standing/stretch breaks and one seated rest breaks.                     PT Education - 09/18/15 1156    Education provided Yes   Education Details PT reviewed vestibular HEP with pt as he reported he has not performed at home, PT also educated pt on SOT results.   Person(s) Educated Patient;Caregiver(s)   Methods Explanation;Verbal cues   Comprehension Verbalized understanding          PT Short Term Goals - 08/22/15 1308    PT SHORT TERM GOAL #1   Title Pt will be IND in HEP to improve strength and balance. Target date:  08/22/15   Status Partially Met   PT SHORT TERM GOAL #2   Title Pt will perform TUG with LRAD in </=13.5sec. to decr. falls risk. Target date: 08/22/15   Status Not Met   PT SHORT TERM GOAL #3   Title Pt will amb. 300' over even terrain with LRAD at MOD I level to improve functional mobility. Target date: 08/22/15   Status Achieved   PT SHORT TERM GOAL #4   Title Pt will improve BERG score to >/=43/56 to decr. falls risk. Target date: 08/22/15   Status Achieved   PT SHORT TERM GOAL #5   Title Pt will improve gait speed wth LRAD to >/=2.45f/sec. to decr. falls risk. Target date: 08/22/15   Status Achieved           PT Long Term Goals - 08/01/15 1526    PT LONG TERM GOAL #1   Title Pt will verbalize understanding of falls prevention strategies to decr. risk of falls. Target date: 09/19/15   Status On-going   PT LONG TERM GOAL #2   Title Pt will amb. 500' with LRAD over even/uneven terrain at MOD I level to improve functional mobility. Target date: 09/19/15   Status On-going   PT LONG TERM GOAL #3   Title Pt will improve BERG score to >/=47/56 to decr. falls risk. Target date: 09/19/15   Status On-going   PT LONG TERM GOAL #4   Title Pt will improve gait speed to >/=2.652fsec. with LRAD to safely amb. in the community. Target date: 09/19/15   Status On-going   PT LONG TERM GOAL #5   Title Assess stairs and write goal if appropriate. Target date: 09/19/15   Status On-going               Plan - 09/18/15 1157    Clinical Impression Statement Pt's SOT composite score WNL when compared to [age]y/odult (that is the highest age group available in [employer] SOT testing). However, pt's sensory analysis indicates decr. somatosensory and vestibular input. Pt required seated rest break and standing rest breaks during SOT 2/2 LBP and R knee pain. Conitnue with POC.    Rehab Potential Good   Clinical Impairments Affecting Rehab Potential co-morbidities   PT Frequency 2x / week   PT Duration 8  weeks   PT Treatment/Interventions ADLs/Self Care Home Management;Biofeedback;Canalith Repostioning;Electrical Stimulation;Balance training;Therapeutic exercise;Manual techniques;Vestibular;Therapeutic activities;Functional mobility training;Orthotic Fit/Training;Stair training;Gait training;Patient/family education;DME Instruction;Neuromuscular re-education   PT Next Visit Plan Continue vestibular balance training,  gait with SPC (with tripod attachment).  Assess stair climbing.   PT Home Exercise Plan OTAGO and postural strengthening   Consulted and Agree with Plan of Care Patient   Family Member Consulted pt's personal asst: [name]      Patient will benefit from skilled therapeutic intervention in order to improve the following deficits and impairments:  Abnormal gait, Decreased endurance, Postural dysfunction, Impaired flexibility, Decreased balance, Decreased mobility, Decreased knowledge of use of DME, Decreased strength  Visit Diagnosis: Other abnormalities of gait and mobility  Muscle weakness (generalized)     Problem List Patient Active Problem List   Diagnosis Date Noted  . Pleural effusion 01/23/2015  . Acute respiratory failure with hypoxia ([place]) 01/07/2015  . PAF (paroxysmal atrial fibrillation) ([place]) 01/07/2015  . Acute respiratory failure ([place]) 01/07/2015  . Shortness of breath 01/06/2015  . Patellar tendinitis of right knee 12/12/2014  . Adenocarcinoma of right lung, stage 1 ([place]) 12/11/2014  . Traumatic rotator cuff tear 11/07/2014  . Compression fracture of L1 lumbar vertebra (HCC) 11/07/2014  . Lumbar degenerative disc disease 09/11/2014  . Osteoarthritis of right knee 09/11/2014  . Chronic anticoagulation 11/22/2012  . DYSLIPIDEMIA 08/06/2008  . COLONIC POLYPS, ADENOMATOUS 07/17/2008  . Essential hypertension 07/17/2008  . Atrial fibrillation ([place]) 07/17/2008  . BRADYCARDIA 07/17/2008  . HEMORRHOIDS 07/17/2008  . Asthma 07/17/2008  . DIVERTICULOSIS,  COLON 07/17/2008  . PALPITATIONS 07/17/2008    Kelcee Bjorn L 09/18/2015, 12:00 PM  [facility] [address] [place], [place], [address] Phone: [phone]   Fax:  [phone]  Name: [name] MRN: [id number] Date of Birth: Jan 23, 1925    Geoffry Paradise, PT,DPT 09/18/2015 12:00 PM Phone: [phone] Fax: [phone]

## 2015-09-19 ENCOUNTER — Ambulatory Visit: Payer: Medicare Other

## 2015-09-19 DIAGNOSIS — R2689 Other abnormalities of gait and mobility: Secondary | ICD-10-CM | POA: Diagnosis not present

## 2015-09-19 DIAGNOSIS — M6281 Muscle weakness (generalized): Secondary | ICD-10-CM

## 2015-09-19 DIAGNOSIS — R293 Abnormal posture: Secondary | ICD-10-CM | POA: Diagnosis not present

## 2015-09-19 NOTE — Therapy (Signed)
[facility] [address] [place], [place], [address] Phone: [phone]   Fax:  [phone]  Physical Therapy Treatment  Patient Details  Name: [name] MRN: [id number] Date of Birth: 06/14/1924 Referring Provider: Dr. Brigitte Pulse  Encounter Date: 09/19/2015      PT End of Session - 09/19/15 1326    Visit Number 14   Number of Visits 17   Date for PT Re-Evaluation 09/23/15   Authorization Type G-code and progress note every 10th visit.   PT Start Time 1104   PT Stop Time 1144   PT Time Calculation (min) 40 min   Equipment Utilized During Treatment Gait belt   Activity Tolerance Patient limited by fatigue   Behavior During Therapy WFL for tasks assessed/performed      Past Medical History  Diagnosis Date  . Bradycardia   . Palpitations   . Asthma   . Hemorrhoids   . Hx of adenomatous colonic polyps 02/1997  . Atrial fibrillation ([place])   . Hypertension   . Diverticulosis of colon   . COPD (chronic obstructive pulmonary disease) (HCC)     mild  . Bowel obstruction ([place])   . Arthritis     osteoarthritis  . Cancer ([place])     skin cancer  . 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[place])   . Adenocarcinoma of right lung, stage 1 ([place]) 12/11/2014    Past Surgical History  Procedure Laterality Date  . Colon resection    . Total hip arthroplasty Right   . Appendectomy    . Laminectomy      Right L4-L5 Laminectomy  . Cystoscopy    . Rotator cuff repair Right   . Umbilical hernia repair    . Video bronchoscopy with endobronchial navigation N/A 12/18/2014    Procedure: VIDEO BRONCHOSCOPY WITH ENDOBRONCHIAL NAVIGATION ;  Surgeon: Collene Gobble, MD;  Location: [facility];  Service: Thoracic;  Laterality: N/A;    There were no vitals filed for this visit.      Subjective Assessment - 09/19/15 1106    Subjective Pt reported he feels a little weak today but denied falls.   Patient is accompained by: Family member  [name]    Pertinent History Stage I lung CA, COPD, asthma, HTN, Lx DDD, compression fx of L1 approx. 1 year ago, R knee OA (torn lateral meniscus), a-fib   Patient Stated Goals Build strength and balance, improve my situation   Currently in Pain? No/denies                         [facility] Adult PT Treatment/Exercise - 09/19/15 1108    Ambulation/Gait   Ambulation/Gait Yes   Ambulation/Gait Assistance 5: Supervision;4: Min guard   Ambulation/Gait Assistance Details Min guard with cane and S with RW. Pt required seated rest breaks 2/2 fatigue and slight wheezing, pt reported he's waiting for his inhaler refill and should get it today. Cues to improve upright posture and heel strike.    Ambulation Distance (Feet) 230 Feet  x2 indoors and 300' outdoors with cane, 100' c RW   Assistive device Straight cane   Gait Pattern Step-through pattern;Trunk flexed   Ambulation Surface Level;Indoor;Unlevel;Outdoor;Paved   Gait velocity 2.37f/sec. with SPC with tripod attachment; 1.933fsec. with RW   Stairs Yes   Stairs Assistance 4: Min guard;5: Supervision   Stairs Assistance Details (indicate cue type and reason) Min guard to descend and cues for sequencing. S for ascending steps.  Stair Management Technique Two rails;Step to pattern   Number of Stairs 4   Height of Stairs 6   High Level Balance   High Level Balance Activities Head turns;Marching forwards   High Level Balance Comments Performed in // bars on compliant surface with min guard for safety: 0-1 UE support. All activities performed 4x7' (head turns/nods and forward marches). Cues for technique and to improve SLS time during marches. Pt with incr. sway and LOB when not using UE support.       Neuro re-ed: Pt reviewed and performed corner balance HEP with pt's caregiver present. Cues for safe technique. Please see pt instructions for details.           PT Education - 09/19/15 1320    Education provided Yes   Education  Details PT discussed renewal, as pt missed several appt's 2/2 fall and illness. PT discussed decr. gait speed likely 2/2 weakness and wheezing today. PT had pt and caregiver practice balance HEP in corner,per pt request. Cues for technique.   Person(s) Educated Patient;Caregiver(s)   Methods Explanation   Comprehension Verbalized understanding          PT Short Term Goals - 08/22/15 1308    PT SHORT TERM GOAL #1   Title Pt will be IND in HEP to improve strength and balance. Target date: 08/22/15   Status Partially Met   PT SHORT TERM GOAL #2   Title Pt will perform TUG with LRAD in </=13.5sec. to decr. falls risk. Target date: 08/22/15   Status Not Met   PT SHORT TERM GOAL #3   Title Pt will amb. 300' over even terrain with LRAD at MOD I level to improve functional mobility. Target date: 08/22/15   Status Achieved   PT SHORT TERM GOAL #4   Title Pt will improve BERG score to >/=43/56 to decr. falls risk. Target date: 08/22/15   Status Achieved   PT SHORT TERM GOAL #5   Title Pt will improve gait speed wth LRAD to >/=2.32f/sec. to decr. falls risk. Target date: 08/22/15   Status Achieved           PT Long Term Goals - 09/19/15 1330    PT LONG TERM GOAL #1   Title Pt will verbalize understanding of falls prevention strategies to decr. risk of falls. Target date: 09/19/15   Baseline All unmet goals with be carried over to new POC.10/03/15   Status On-going   PT LONG TERM GOAL #2   Title Pt will amb. 500' with LRAD over even/uneven terrain at MOD I level to improve functional mobility. Target date: 09/19/15   Status Not Met   PT LONG TERM GOAL #3   Title Pt will improve BERG score to >/=51/56 to decr. falls risk. Target date: 09/19/15   Baseline met 09/04/15 47/56, revised 09/19/15   Status Revised   PT LONG TERM GOAL #4   Title Pt will improve gait speed to >/=2.676fsec. with LRAD to safely amb. in the community. Target date: 09/19/15   Status Partially Met   PT LONG TERM GOAL #5    Title Pt will traverse 12 steps with 2 handrails in step through manner at MOD I level to traverse steps at home safely. Target date: 09/19/15   Baseline Step to with 2 handrails   Status Revised               Plan - 09/19/15 1328    Clinical Impression Statement PT requesting additional 2x/week  for 2 weeks, due to pt missing appt's as he fell and had illness. Pt's gait speed was decr. today vs. 09/04/15, this could be due to pt experiencing wheezing today (he ran out of inhaler but will get it today) and incr. reports of feeling weakness today. Pt continues to experience incr. postural sway and LOB during balance activities which challenge vestibular system. Continue with POC.    Rehab Potential Good   Clinical Impairments Affecting Rehab Potential co-morbidities   PT Frequency 2x / week   PT Duration 2 weeks  original POC 2x/week for 8 weeks   PT Treatment/Interventions ADLs/Self Care Home Management;Biofeedback;Canalith Repostioning;Electrical Stimulation;Balance training;Therapeutic exercise;Manual techniques;Vestibular;Therapeutic activities;Functional mobility training;Orthotic Fit/Training;Stair training;Gait training;Patient/family education;DME Instruction;Neuromuscular re-education   PT Next Visit Plan Continue vestibular balance training, gait with SPC (with tripod attachment).    PT Home Exercise Plan OTAGO and postural strengthening   Consulted and Agree with Plan of Care Patient   Family Member Consulted pt's personal asst: Joelene Millin      Patient will benefit from skilled therapeutic intervention in order to improve the following deficits and impairments:  Abnormal gait, Decreased endurance, Postural dysfunction, Impaired flexibility, Decreased balance, Decreased mobility, Decreased knowledge of use of DME, Decreased strength  Visit Diagnosis: Other abnormalities of gait and mobility - Plan: PT plan of care cert/re-cert  Muscle weakness (generalized) - Plan: PT plan of care  cert/re-cert  Abnormal posture - Plan: PT plan of care cert/re-cert     Problem List Patient Active Problem List   Diagnosis Date Noted  . Pleural effusion 01/23/2015  . Acute respiratory failure with hypoxia ([place]) 01/07/2015  . PAF (paroxysmal atrial fibrillation) ([place]) 01/07/2015  . Acute respiratory failure ([place]) 01/07/2015  . Shortness of breath 01/06/2015  . Patellar tendinitis of right knee 12/12/2014  . Adenocarcinoma of right lung, stage 1 ([facility]) 12/11/2014  . Traumatic rotator cuff tear 11/07/2014  . Compression fracture of L1 lumbar vertebra (HCC) 11/07/2014  . Lumbar degenerative disc disease 09/11/2014  . Osteoarthritis of right knee 09/11/2014  . Chronic anticoagulation 11/22/2012  . DYSLIPIDEMIA 08/06/2008  . COLONIC POLYPS, ADENOMATOUS 07/17/2008  . Essential hypertension 07/17/2008  . Atrial fibrillation ([place]) 07/17/2008  . BRADYCARDIA 07/17/2008  . HEMORRHOIDS 07/17/2008  . Asthma 07/17/2008  . DIVERTICULOSIS, COLON 07/17/2008  . PALPITATIONS 07/17/2008    Kari Montero L 09/19/2015, 1:35 PM  [facility] [address] [place], [place], [address] Phone: [phone]   Fax:  [phone]  Name: [name] MRN: [id number] Date of Birth: 25-Dec-1924    Geoffry Paradise, PT,DPT 09/19/2015 1:35 PM Phone: [phone] Fax: [phone]

## 2015-09-19 NOTE — Patient Instructions (Signed)
Perform in a corner with a chair in front of you for safety OR perform at kitchen sink with chair behind you. Perform with somebody there with you for safety.  Feet Apart (Compliant Surface) Varied Arm Positions - Eyes Closed    Stand on compliant surface: __pillow/cushion______ with feet shoulder width apart and arms at your side. Close eyes and visualize upright position. Hold__10__ seconds. Repeat __3__ times per session. Do __1__ sessions per day.  Copyright  VHI. All rights reserved.  Feet Together (Compliant Surface) Head Motion - Eyes Open    With eyes open, standing on compliant surface: __pillow/cushion______, feet together, move head slowly: up and down 5 times and side to side 5 times. Repeat __3__ times per session. Do __1__ sessions per day.  Copyright  VHI. All rights reserved.

## 2015-09-23 DIAGNOSIS — J301 Allergic rhinitis due to pollen: Secondary | ICD-10-CM | POA: Diagnosis not present

## 2015-09-23 DIAGNOSIS — J3089 Other allergic rhinitis: Secondary | ICD-10-CM | POA: Diagnosis not present

## 2015-09-25 DIAGNOSIS — D1801 Hemangioma of skin and subcutaneous tissue: Secondary | ICD-10-CM | POA: Diagnosis not present

## 2015-09-25 DIAGNOSIS — Z85828 Personal history of other malignant neoplasm of skin: Secondary | ICD-10-CM | POA: Diagnosis not present

## 2015-09-25 DIAGNOSIS — L821 Other seborrheic keratosis: Secondary | ICD-10-CM | POA: Diagnosis not present

## 2015-09-25 DIAGNOSIS — L570 Actinic keratosis: Secondary | ICD-10-CM | POA: Diagnosis not present

## 2015-09-25 DIAGNOSIS — D224 Melanocytic nevi of scalp and neck: Secondary | ICD-10-CM | POA: Diagnosis not present

## 2015-09-25 DIAGNOSIS — D225 Melanocytic nevi of trunk: Secondary | ICD-10-CM | POA: Diagnosis not present

## 2015-09-25 DIAGNOSIS — D692 Other nonthrombocytopenic purpura: Secondary | ICD-10-CM | POA: Diagnosis not present

## 2015-10-02 ENCOUNTER — Ambulatory Visit: Payer: Medicare Other | Attending: Internal Medicine

## 2015-10-02 DIAGNOSIS — M6281 Muscle weakness (generalized): Secondary | ICD-10-CM | POA: Insufficient documentation

## 2015-10-02 DIAGNOSIS — R293 Abnormal posture: Secondary | ICD-10-CM

## 2015-10-02 DIAGNOSIS — R2689 Other abnormalities of gait and mobility: Secondary | ICD-10-CM | POA: Diagnosis not present

## 2015-10-02 NOTE — Therapy (Signed)
[facility] [address] [place], [place], [address] Phone: [phone]   Fax:  [phone]  Physical Therapy Treatment  Patient Details  Name: [name] MRN: [id number] Date of Birth: 1924/06/24 Referring Provider: Dr. Brigitte Pulse  Encounter Date: 10/02/2015      PT End of Session - 10/02/15 1204    Visit Number 15   Number of Visits 17   Date for PT Re-Evaluation 10/07/15   Authorization Type G-code and progress note every 10th visit.   PT Start Time 1101   PT Stop Time 1147   PT Time Calculation (min) 46 min   Equipment Utilized During Treatment Gait belt   Activity Tolerance Patient tolerated treatment well   Behavior During Therapy WFL for tasks assessed/performed      Past Medical History  Diagnosis Date  . Bradycardia   . Palpitations   . Asthma   . Hemorrhoids   . Hx of adenomatous colonic polyps 02/1997  . Atrial fibrillation ([place])   . Hypertension   . Diverticulosis of colon   . COPD (chronic obstructive pulmonary disease) (HCC)     mild  . Bowel obstruction ([place])   . Arthritis     osteoarthritis  . Cancer ([facility])     skin cancer  . 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 Adenocarcinoma of right lung, stage 1 ([place]) 12/11/2014    Past Surgical History  Procedure Laterality Date  . Colon resection    . Total hip arthroplasty Right   . Appendectomy    . Laminectomy      Right L4-L5 Laminectomy  . Cystoscopy    . Rotator cuff repair Right   . Umbilical hernia repair    . Video bronchoscopy with endobronchial navigation N/A 12/18/2014    Procedure: VIDEO BRONCHOSCOPY WITH ENDOBRONCHIAL NAVIGATION ;  Surgeon: Collene Gobble, MD;  Location: [facility];  Service: Thoracic;  Laterality: N/A;    There were no vitals filed for this visit.      Subjective Assessment - 10/02/15 1103    Subjective Pt reported B LE edema has been better after MD adjusted medication.  Pt denied falls and reported HEP is going well.     Patient is accompained by: Family member  [name]   Pertinent History Stage I lung CA, COPD, asthma, HTN, Lx DDD, compression fx of L1 approx. 1 year ago, R knee OA (torn lateral meniscus), a-fib   Patient Stated Goals Build strength and balance, improve my situation   Currently in Pain? No/denies                         [facility] Adult PT Treatment/Exercise - 10/02/15 1106    Ambulation/Gait   Ambulation/Gait Yes   Ambulation/Gait Assistance 5: Supervision   Ambulation/Gait Assistance Details Pt amb. over even/uneven terrain with cues for upright posture while traversing decline and to improve heel strike. No overt LOB.   Ambulation Distance (Feet) 400 Feet  outdoors and 230 indoors   Assistive device Straight cane  with attachment   Gait Pattern Step-through pattern;Trunk flexed   Ambulation Surface Level;Unlevel;Indoor;Outdoor;Paved;Gravel;Grass;Other (comment)  mulch   Standardized Balance Assessment   Standardized Balance Assessment Berg Balance Test   Berg Balance Test   Sit to Stand Able to stand without using hands and stabilize independently   Standing Unsupported Able to stand safely 2 minutes   Sitting with Back Unsupported but Feet Supported on Floor or Stool Able to  sit safely and securely 2 minutes   Stand to Sit Sits safely with minimal use of hands   Transfers Able to transfer safely, minor use of hands   Standing Unsupported with Eyes Closed Able to stand 10 seconds safely   Standing Ubsupported with Feet Together Able to place feet together independently and stand 1 minute safely   From Standing, Reach Forward with Outstretched Arm Can reach forward >12 cm safely (5")   From Standing Position, Pick up Object from Floor Able to pick up shoe safely and easily   From Standing Position, Turn to Look Behind Over each Shoulder Looks behind from both sides and weight shifts well   Turn 360 Degrees Able to turn 360 degrees safely one side only in 4  seconds or less   Standing Unsupported, Alternately Place Feet on Step/Stool Able to stand independently and safely and complete 8 steps in 20 seconds   Standing Unsupported, One Foot in Front Able to take small step independently and hold 30 seconds   Standing on One Leg Able to lift leg independently and hold > 10 seconds   Total Score 52   Knee/Hip Exercises: Standing   Heel Raises 2 sets;10 reps;Both   Heel Raises Limitations Performed in // bars with S for safety and B UE support, cues to improve speed to work on incr. power.   SLS In //bars with no UE support with min guard to min A to maintain balance, with emphasis on improving B glute med strength, 2x4cones/LE.   Gait Training In // bars with 1-2 UE support pt performed fast (speed) walking) 4x7' to  improve strength and power/speed.                 PT Education - 10/02/15 1156    Education provided Yes   Education Details PT discussed BERG results and goal progress, and d/c tomorrow. PT also discussed ACT by [place] after d/c from PT. PT discussed the importance of always using an AD to maintain balance during gait, as pt reported he'd liked to amb. without AD but does not feel safe. PT also discouraged pt from trying to amb. on sand without AD during beach trip next week-PT discussed using a beach w/c.   Person(s) Educated Patient;Caregiver(s)   Methods Explanation   Comprehension Verbalized understanding          PT Short Term Goals - 08/22/15 1308    PT SHORT TERM GOAL #1   Title Pt will be IND in HEP to improve strength and balance. Target date: 08/22/15   Status Partially Met   PT SHORT TERM GOAL #2   Title Pt will perform TUG with LRAD in </=13.5sec. to decr. falls risk. Target date: 08/22/15   Status Not Met   PT SHORT TERM GOAL #3   Title Pt will amb. 300' over even terrain with LRAD at MOD I level to improve functional mobility. Target date: 08/22/15   Status Achieved   PT SHORT TERM GOAL #4   Title Pt will  improve BERG score to >/=43/56 to decr. falls risk. Target date: 08/22/15   Status Achieved   PT SHORT TERM GOAL #5   Title Pt will improve gait speed wth LRAD to >/=2.64f/sec. to decr. falls risk. Target date: 08/22/15   Status Achieved           PT Long Term Goals - 10/02/15 1206    PT LONG TERM GOAL #1   Title Pt will verbalize understanding  of falls prevention strategies to decr. risk of falls. Target date: 09/19/15   Baseline All unmet goals with be carried over to new POC.10/03/15   Status On-going   PT LONG TERM GOAL #2   Title Pt will amb. 500' with LRAD over even/uneven terrain at MOD I level to improve functional mobility. Target date: 09/19/15   Status Not Met   PT LONG TERM GOAL #3   Title Pt will improve BERG score to >/=51/56 to decr. falls risk. Target date: 09/19/15   Baseline met 09/04/15 47/56, revised 09/19/15   Status Achieved   PT LONG TERM GOAL #4   Title Pt will improve gait speed to >/=2.39f/sec. with LRAD to safely amb. in the community. Target date: 09/19/15   Status Partially Met   PT LONG TERM GOAL #5   Title Pt will traverse 12 steps with 2 handrails in step through manner at MOD I level to traverse steps at home safely. Target date: 09/19/15   Baseline Step to with 2 handrails   Status Revised               Plan - 10/02/15 1205    Clinical Impression Statement Pt demonstrated progess, as his BERG score improved to 52/56, indicating low risk for falls. However, PT encouraged pt to use AD at all times as he reports his LBP can cause impaired balance and he becomes fatigued in the afternoon. Continue with POC.    Rehab Potential Good   Clinical Impairments Affecting Rehab Potential co-morbidities   PT Frequency 2x / week   PT Duration 2 weeks  original POC 2x/week for 8 weeks   PT Treatment/Interventions ADLs/Self Care Home Management;Biofeedback;Canalith Repostioning;Electrical Stimulation;Balance training;Therapeutic exercise;Manual  techniques;Vestibular;Therapeutic activities;Functional mobility training;Orthotic Fit/Training;Stair training;Gait training;Patient/family education;DME Instruction;Neuromuscular re-education   PT Next Visit Plan G-CODE and d/c.   PT Home Exercise Plan OTAGO and postural strengthening   Consulted and Agree with Plan of Care Patient   Family Member Consulted pt's personal asst: KJoelene Millin     Patient will benefit from skilled therapeutic intervention in order to improve the following deficits and impairments:  Abnormal gait, Decreased endurance, Postural dysfunction, Impaired flexibility, Decreased balance, Decreased mobility, Decreased knowledge of use of DME, Decreased strength  Visit Diagnosis: Other abnormalities of gait and mobility  Muscle weakness (generalized)  Abnormal posture     Problem List Patient Active Problem List   Diagnosis Date Noted  . Pleural effusion 01/23/2015  . Acute respiratory failure with hypoxia ([place] 01/07/2015  . PAF (paroxysmal atrial fibrillation) ([place] 01/07/2015  . Acute respiratory failure ([place] 01/07/2015  . Shortness of breath 01/06/2015  . Patellar tendinitis of right knee 12/12/2014  . Adenocarcinoma of right lung, stage 1 ([place] 12/11/2014  . Traumatic rotator cuff tear 11/07/2014  . Compression fracture of L1 lumbar vertebra (HCC) 11/07/2014  . Lumbar degenerative disc disease 09/11/2014  . Osteoarthritis of right knee 09/11/2014  . Chronic anticoagulation 11/22/2012  . DYSLIPIDEMIA 08/06/2008  . COLONIC POLYPS, ADENOMATOUS 07/17/2008  . Essential hypertension 07/17/2008  . Atrial fibrillation ([place] 07/17/2008  . BRADYCARDIA 07/17/2008  . HEMORRHOIDS 07/17/2008  . Asthma 07/17/2008  . DIVERTICULOSIS, COLON 07/17/2008  . PALPITATIONS 07/17/2008    Elliott Lasecki L 10/02/2015, 12:07 PM  [facility] StreetSAmoryGCreve [place] [address]Phone: [phone]  Fax:   [phone] Name: HNAYEF COLLEGEMRN: [id number] of Birth: [birth year]   JGeoffry Paradise PT,DPT 10/02/2015 12:07 PM Phone: [phone]  Fax: [phone]

## 2015-10-03 ENCOUNTER — Ambulatory Visit: Payer: Medicare Other

## 2015-10-03 DIAGNOSIS — R293 Abnormal posture: Secondary | ICD-10-CM | POA: Diagnosis not present

## 2015-10-03 DIAGNOSIS — M6281 Muscle weakness (generalized): Secondary | ICD-10-CM | POA: Diagnosis not present

## 2015-10-03 DIAGNOSIS — R2689 Other abnormalities of gait and mobility: Secondary | ICD-10-CM | POA: Diagnosis not present

## 2015-10-03 NOTE — Therapy (Signed)
[facility] [address] [place], [place], [address] Phone: [phone]   Fax:  [phone]  Physical Therapy Treatment  Patient Details  Name: [name] MRN: [id number] Date of Birth: 1924/07/15 Referring Provider: Dr. Brigitte Pulse  Encounter Date: 10/03/2015      PT End of Session - 10/03/15 1228    Visit Number 16   Number of Visits 17   Date for PT Re-Evaluation 10/07/15   Authorization Type G-code and progress note every 10th visit.   PT Start Time 1146   PT Stop Time 1225   PT Time Calculation (min) 39 min   Equipment Utilized During Treatment Gait belt   Activity Tolerance Patient tolerated treatment well   Behavior During Therapy WFL for tasks assessed/performed      Past Medical History  Diagnosis Date  . Bradycardia   . Palpitations   . Asthma   . Hemorrhoids   . Hx of adenomatous colonic polyps 02/1997  . Atrial fibrillation ([place])   . Hypertension   . Diverticulosis of colon   . COPD (chronic obstructive pulmonary disease) (HCC)     mild  . Bowel obstruction ([place])   . Arthritis     osteoarthritis  . Cancer ([place])     skin cancer  . 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 Adenocarcinoma of right lung, stage 1 ([place]) 12/11/2014    Past Surgical History  Procedure Laterality Date  . Colon resection    . Total hip arthroplasty Right   . Appendectomy    . Laminectomy      Right L4-L5 Laminectomy  . Cystoscopy    . Rotator cuff repair Right   . Umbilical hernia repair    . Video bronchoscopy with endobronchial navigation N/A 12/18/2014    Procedure: VIDEO BRONCHOSCOPY WITH ENDOBRONCHIAL NAVIGATION ;  Surgeon: Collene Gobble, MD;  Location: [facility];  Service: Thoracic;  Laterality: N/A;    There were no vitals filed for this visit.      Subjective Assessment - 10/03/15 1149    Subjective Pt denied falls or changes since last visit. Pt reported R knee and LBP occur when walking but not  at rest.   Patient  is accompained by: Family member  [name]   Patient Stated Goals Build strength and balance, improve my situation   Currently in Pain? No/denies                         [facility] Adult PT Treatment/Exercise - 10/03/15 1153    Ambulation/Gait   Ambulation/Gait Yes   Ambulation/Gait Assistance 6: Modified independent (Device/Increase time)   Ambulation/Gait Assistance Details Pt amb. with SPC and tripod attachment without any LOB. Pt required rest break after amb. 2/2 fatigue and LBP.   Ambulation Distance (Feet) 550 Feet, 50'x5   Assistive device Straight cane  with tripod attachment   Gait Pattern Step-through pattern;Trunk flexed   Ambulation Surface Level;Unlevel;Indoor;Outdoor;Paved   Gait velocity 2.21f/sec. with SPC with tripod attachment, and 2.429fsec. with RW   Stairs Yes   Stairs Assistance 6: Modified independent (Device/Increase time)   Stairs Assistance Details (indicate cue type and reason) Step to pattern 2/2 R knee pain, no LOB   Stair Management Technique Two rails;Step to pattern   Number of Stairs 12   Height of Stairs 6   Gait Comments Pt reported slight incr. in SOB after traversing stairs: 94-96% on room aire and HR 96bpm.   Standardized  Balance Assessment   Standardized Balance Assessment Timed Up and Go Test   Timed Up and Go Test   TUG Normal TUG   Normal TUG (seconds) 10.5  with RW and 12.1 with SPC      ABC performed after session: 58.8%          PT Education - 10/03/15 1227    Education provided Yes   Education Details PT discussed goal progress and outcome measure results. PT discussed the importance of continuing HEP after PT d/c.  PT discussed how to safely progress OTAGO as tolerated.   Person(s) Educated Patient;Caregiver(s)   Methods Explanation   Comprehension Verbalized understanding          PT Short Term Goals - 10/03/15 1229    PT SHORT TERM GOAL #1   Title Pt will be IND in HEP to improve strength  and balance. Target date: 08/22/15   Status Partially Met   PT SHORT TERM GOAL #2   Title Pt will perform TUG with LRAD in </=13.5sec. to decr. falls risk. Target date: 08/22/15   Status Achieved   PT SHORT TERM GOAL #3   Title Pt will amb. 300' over even terrain with LRAD at MOD I level to improve functional mobility. Target date: 08/22/15   Status Achieved   PT SHORT TERM GOAL #4   Title Pt will improve BERG score to >/=43/56 to decr. falls risk. Target date: 08/22/15   Status Achieved   PT SHORT TERM GOAL #5   Title Pt will improve gait speed wth LRAD to >/=2.48f/sec. to decr. falls risk. Target date: 08/22/15   Status Achieved           PT Long Term Goals - 10/03/15 1229    PT LONG TERM GOAL #1   Title Pt will verbalize understanding of falls prevention strategies to decr. risk of falls. Target date: 09/19/15   Baseline All unmet goals with be carried over to new POC.10/03/15   Status Achieved   PT LONG TERM GOAL #2   Title Pt will amb. 500' with LRAD over even/uneven terrain at MOD I level to improve functional mobility. Target date: 09/19/15   Status Achieved   PT LONG TERM GOAL #3   Title Pt will improve BERG score to >/=51/56 to decr. falls risk. Target date: 09/19/15   Baseline met 09/04/15 47/56, revised 09/19/15   Status Achieved   PT LONG TERM GOAL #4   Title Pt will improve gait speed to >/=2.642fsec. with LRAD to safely amb. in the community. Target date: 09/19/15   Status Achieved   PT LONG TERM GOAL #5   Title Pt will traverse 12 steps with 2 handrails in step through manner at MOD I level to traverse steps at home safely. Target date: 09/19/15   Baseline Step to with 2 handrails   Status Partially Met               Plan - 10/03/15 1228    Clinical Impression Statement Pt met LTGs 1, 2, 3, 4 and partially met LTG 5. Please see d/c summary for details.    Rehab Potential Good   Clinical Impairments Affecting Rehab Potential co-morbidities   PT Frequency 2x /  week   PT Duration 2 weeks  original POC 2x/week for 8 weeks   PT Treatment/Interventions ADLs/Self Care Home Management;Biofeedback;Canalith Repostioning;Electrical Stimulation;Balance training;Therapeutic exercise;Manual techniques;Vestibular;Therapeutic activities;Functional mobility training;Orthotic Fit/Training;Stair training;Gait training;Patient/family education;DME Instruction;Neuromuscular re-education   PT Next Visit Plan G-CODE and d/c.  PT Home Exercise Plan OTAGO and postural strengthening   Consulted and Agree with Plan of Care Patient   Family Member Consulted pt's personal asst: Joelene Millin      Patient will benefit from skilled therapeutic intervention in order to improve the following deficits and impairments:  Abnormal gait, Decreased endurance, Postural dysfunction, Impaired flexibility, Decreased balance, Decreased mobility, Decreased knowledge of use of DME, Decreased strength  Visit Diagnosis: Other abnormalities of gait and mobility  Muscle weakness (generalized)  Abnormal posture       G-Codes - 10/20/15 1230    Functional Assessment Tool Used BERG: 52/56; gait speed with RW: 2.84f/sec. and 2.637fsec with cane; TUG with RW:10.5sec. and 12.1sec. with cane   Functional Limitation Mobility: Walking and moving around   Mobility: Walking and Moving Around Goal Status ([phone] least 1 percent but less than 20 percent impaired, limited or restricted   Mobility: Walking and Moving Around Discharge Status ([phone] least 1 percent but less than 20 percent impaired, limited or restricted      Problem List Patient Active Problem List   Diagnosis Date Noted  . Pleural effusion 01/23/2015  . Acute respiratory failure with hypoxia ([facility]/01/2015  . PAF (paroxysmal atrial fibrillation) ([facility]10/01/2015  . Acute respiratory failure ([facility]10/01/2015  . Shortness of breath 01/06/2015  . Patellar tendinitis of right knee 12/12/2014  . Adenocarcinoma of right lung,  stage 1 ([facility]09/14/2016  . Traumatic rotator cuff tear 11/07/2014  . Compression fracture of L1 lumbar vertebra (HCC) 11/07/2014  . Lumbar degenerative disc disease 09/11/2014  . Osteoarthritis of right knee 09/11/2014  . Chronic anticoagulation 11/22/2012  . DYSLIPIDEMIA 08/06/2008  . COLONIC POLYPS, ADENOMATOUS 07/17/2008  . Essential hypertension 07/17/2008  . Atrial fibrillation ([facility]04/21/2010  . BRADYCARDIA 07/17/2008  . HEMORRHOIDS 07/17/2008  . Asthma 07/17/2008  . DIVERTICULOSIS, COLON 07/17/2008  . PALPITATIONS 07/17/2008    Kaleah Hagemeister L 7/07-24-201712:35 PM  [address] [place]hone: [phone] Fax:  [phone]: HaJARELL MCEWENRN: [id number] of Birth: 7/01-27-1926PHYSICAL THERAPY DISCHARGE SUMMARY  Visits from Start of Care: 16  Current functional level related to goals / functional outcomes:     PT Long Term Goals - [id number]    PT LONG TERM GOAL #1   Title Pt will verbalize understanding of falls prevention strategies to decr. risk of falls. Target date: 09/19/15   Baseline All unmet goals with be carried over to new POC.10/03/22/17 Status Achieved   PT LONG TERM GOAL #2   Title Pt will amb. 500' with LRAD over even/uneven terrain at MOD I level to improve functional mobility. Target date: 09/19/15   Status Achieved   PT LONG TERM GOAL #3   Title Pt will improve BERG score to >/=51/56 to decr. falls risk. Target date: 09/19/15   Baseline met 09/04/15 47/56, revised 09/19/15   Status Achieved   PT LONG TERM GOAL #4   Title Pt will improve gait speed to >/=2.6264fec. with LRAD to safely amb. in the community. Target date: 09/19/15   Status Achieved   PT LONG TERM GOAL #5   Title Pt will traverse 12 steps with 2 handrails in step through manner at MOD I level to traverse steps at home safely. Target date: 09/19/15   Baseline Step to with 2  handrails   Status Partially Met        Remaining  deficits: Decr. Endurance and impaired balance during activities which challenge vestibular system   Education / Equipment: HEP  Plan: Patient agrees to discharge.  Patient goals were met. Patient is being discharged due to meeting the stated rehab goals.  ?????       Geoffry Paradise, PT,DPT 10/03/2015 12:35 PM Phone: [phone] Fax: [phone]

## 2015-10-13 ENCOUNTER — Ambulatory Visit (HOSPITAL_COMMUNITY)
Admission: RE | Admit: 2015-10-13 | Discharge: 2015-10-13 | Disposition: A | Discharge status: Home / Self Care | Payer: Medicare Other | Source: Ambulatory Visit | Attending: Internal Medicine | Admitting: Internal Medicine

## 2015-10-13 ENCOUNTER — Ambulatory Visit (INDEPENDENT_AMBULATORY_CARE_PROVIDER_SITE_OTHER): Payer: Medicare Other | Admitting: Family Medicine

## 2015-10-13 ENCOUNTER — Ambulatory Visit (INDEPENDENT_AMBULATORY_CARE_PROVIDER_SITE_OTHER): Payer: Medicare Other

## 2015-10-13 VITALS — BP 118/72 | HR 76 | Temp 97.9°F | Resp 16 | Ht 70.0 in | Wt 172.2 lb

## 2015-10-13 DIAGNOSIS — G4700 Insomnia, unspecified: Secondary | ICD-10-CM | POA: Diagnosis not present

## 2015-10-13 DIAGNOSIS — Z96649 Presence of unspecified artificial hip joint: Secondary | ICD-10-CM | POA: Diagnosis not present

## 2015-10-13 DIAGNOSIS — Z7901 Long term (current) use of anticoagulants: Secondary | ICD-10-CM | POA: Diagnosis not present

## 2015-10-13 DIAGNOSIS — Z882 Allergy status to sulfonamides status: Secondary | ICD-10-CM | POA: Diagnosis not present

## 2015-10-13 DIAGNOSIS — I482 Chronic atrial fibrillation: Secondary | ICD-10-CM | POA: Diagnosis not present

## 2015-10-13 DIAGNOSIS — Z85828 Personal history of other malignant neoplasm of skin: Secondary | ICD-10-CM | POA: Diagnosis not present

## 2015-10-13 DIAGNOSIS — S51801A Unspecified open wound of right forearm, initial encounter: Secondary | ICD-10-CM | POA: Diagnosis not present

## 2015-10-13 DIAGNOSIS — Z79899 Other long term (current) drug therapy: Secondary | ICD-10-CM | POA: Diagnosis not present

## 2015-10-13 DIAGNOSIS — L03113 Cellulitis of right upper limb: Secondary | ICD-10-CM | POA: Diagnosis not present

## 2015-10-13 DIAGNOSIS — M25521 Pain in right elbow: Secondary | ICD-10-CM

## 2015-10-13 DIAGNOSIS — J449 Chronic obstructive pulmonary disease, unspecified: Secondary | ICD-10-CM | POA: Diagnosis not present

## 2015-10-13 DIAGNOSIS — C3491 Malignant neoplasm of unspecified part of right bronchus or lung: Secondary | ICD-10-CM | POA: Diagnosis not present

## 2015-10-13 DIAGNOSIS — G8929 Other chronic pain: Secondary | ICD-10-CM | POA: Diagnosis not present

## 2015-10-13 DIAGNOSIS — I10 Essential (primary) hypertension: Secondary | ICD-10-CM | POA: Diagnosis not present

## 2015-10-13 DIAGNOSIS — I480 Paroxysmal atrial fibrillation: Secondary | ICD-10-CM | POA: Diagnosis not present

## 2015-10-13 DIAGNOSIS — C3492 Malignant neoplasm of unspecified part of left bronchus or lung: Secondary | ICD-10-CM | POA: Diagnosis not present

## 2015-10-13 DIAGNOSIS — M7989 Other specified soft tissue disorders: Secondary | ICD-10-CM | POA: Diagnosis not present

## 2015-10-13 DIAGNOSIS — C3412 Malignant neoplasm of upper lobe, left bronchus or lung: Secondary | ICD-10-CM

## 2015-10-13 DIAGNOSIS — M545 Low back pain: Secondary | ICD-10-CM | POA: Diagnosis not present

## 2015-10-13 DIAGNOSIS — Z7952 Long term (current) use of systemic steroids: Secondary | ICD-10-CM | POA: Diagnosis not present

## 2015-10-13 DIAGNOSIS — M1990 Unspecified osteoarthritis, unspecified site: Secondary | ICD-10-CM | POA: Diagnosis not present

## 2015-10-13 DIAGNOSIS — J9610 Chronic respiratory failure, unspecified whether with hypoxia or hypercapnia: Secondary | ICD-10-CM | POA: Diagnosis not present

## 2015-10-13 LAB — POCT CBC
GRANULOCYTE PERCENT: 75.6 % (ref 37–80)
HCT, POC: 36.1 % — AB (ref 43.5–53.7)
Hemoglobin: 12.3 g/dL — AB (ref 14.1–18.1)
LYMPH, POC: 1.5 (ref 0.6–3.4)
MCH, POC: 32.1 pg — AB (ref 27–31.2)
MCHC: 34 g/dL (ref 31.8–35.4)
MCV: 94.5 fL (ref 80–97)
MID (CBC): 0.7 (ref 0–0.9)
MPV: 6.6 fL (ref 0–99.8)
PLATELET COUNT, POC: 191 10*3/uL (ref 142–424)
POC Granulocyte: 6.7 (ref 2–6.9)
POC LYMPH %: 16.4 % (ref 10–50)
POC MID %: 8 %M (ref 0–12)
RBC: 3.82 M/uL — AB (ref 4.69–6.13)
RDW, POC: 19.4 %
WBC: 8.9 10*3/uL (ref 4.6–10.2)

## 2015-10-13 LAB — GLUCOSE, CAPILLARY: GLUCOSE-CAPILLARY: 94 mg/dL (ref 65–99)

## 2015-10-13 MED ORDER — FLUDEOXYGLUCOSE F - 18 (FDG) INJECTION
8.3900 | Freq: Once | INTRAVENOUS | Status: AC | PRN
  Administered 2015-10-13: 8.39 via INTRAVENOUS

## 2015-10-13 MED ORDER — DOXYCYCLINE HYCLATE 100 MG PO TABS: 100.0000 mg | ORAL_TABLET | Freq: Two times a day (BID) | ORAL | Status: DC

## 2015-10-13 NOTE — Patient Instructions (Addendum)
IF you received an x-ray today, you will receive an invoice from [facility]. Please contact [facility] at [phone] with questions or concerns regarding your invoice.   IF you received labwork today, you will receive an invoice from [employer]. Please contact [facility] at [phone] with questions or concerns regarding your invoice.   Our billing staff will not be able to assist you with questions regarding bills from these companies.  You will be contacted with the lab results as soon as they are available. The fastest way to get your results is to activate your My Chart account. Instructions are located on the last page of this paperwork. If you have not heard from [place] regarding the results in 2 weeks, please contact this office.     The radiologist did not see any fractures are broken bones. There was some swelling around the elbow consistent with a contusion. I suspect the swelling and redness is due to cellulitis, so we'll start doxycycline. Recheck in the next 24-48 hours to evaluate this infection and make sure you're still stable for outpatient treatment. If you have fevers, chills, or rapidly spreading redness, be seen through the emergency room.  Return to the clinic or go to the nearest emergency room if any of your symptoms worsen or new symptoms occur.  Cellulitis Cellulitis is an infection of the skin and the tissue beneath it. The infected area is usually red and tender. Cellulitis occurs most often in the arms and lower legs.  CAUSES  Cellulitis is caused by bacteria that enter the skin through cracks or cuts in the skin. The most common types of bacteria that cause cellulitis are staphylococci and streptococci. SIGNS AND SYMPTOMS   Redness and warmth.  Swelling.  Tenderness or pain.  Fever. DIAGNOSIS  Your health care provider can usually determine what is wrong based on a physical exam. Blood tests may also be  done. TREATMENT  Treatment usually involves taking an antibiotic medicine. HOME CARE INSTRUCTIONS   Take your antibiotic medicine as directed by your health care provider. Finish the antibiotic even if you start to feel better.  Keep the infected arm or leg elevated to reduce swelling.  Apply a warm cloth to the affected area up to 4 times per day to relieve pain.  Take medicines only as directed by your health care provider.  Keep all follow-up visits as directed by your health care provider. SEEK MEDICAL CARE IF:   You notice red streaks coming from the infected area.  Your red area gets larger or turns dark in color.  Your bone or joint underneath the infected area becomes painful after the skin has healed.  Your infection returns in the same area or another area.  You notice a swollen bump in the infected area.  You develop new symptoms.  You have a fever. SEEK IMMEDIATE MEDICAL CARE IF:   You feel very sleepy.  You develop vomiting or diarrhea.  You have a general ill feeling (malaise) with muscle aches and pains.   This information is not intended to replace advice given to you by your health care provider. Make sure you discuss any questions you have with your health care provider.   Document Released: 12/23/2004 Document Revised: 12/04/2014 Document Reviewed: 05/31/2011 Elsevier Interactive Patient Education 2016 [place] A contusion is a deep bruise. Contusions are the result of a blunt injury to tissues and muscle fibers under the skin. The injury causes bleeding under  the skin. The skin overlying the contusion may turn blue, purple, or yellow. Minor injuries will give you a painless contusion, but more severe contusions may stay painful and swollen for a few weeks.  CAUSES  This condition is usually caused by a blow, trauma, or direct force to an area of the body. SYMPTOMS  Symptoms of this condition include:  Swelling of the injured  area.  Pain and tenderness in the injured area.  Discoloration. The area may have redness and then turn blue, purple, or yellow. DIAGNOSIS  This condition is diagnosed based on a physical exam and medical history. An X-ray, CT scan, or MRI may be needed to determine if there are any associated injuries, such as broken bones (fractures). TREATMENT  Specific treatment for this condition depends on what area of the body was injured. In general, the best treatment for a contusion is resting, icing, applying pressure to (compression), and elevating the injured area. This is often called the RICE strategy. Over-the-counter anti-inflammatory medicines may also be recommended for pain control.  HOME CARE INSTRUCTIONS   Rest the injured area.  If directed, apply ice to the injured area:  Put ice in a plastic bag.  Place a towel between your skin and the bag.  Leave the ice on for 20 minutes, 2-3 times per day.  If directed, apply light compression to the injured area using an elastic bandage. Make sure the bandage is not wrapped too tightly. Remove and reapply the bandage as directed by your health care provider.  If possible, raise (elevate) the injured area above the level of your heart while you are sitting or lying down.  Take over-the-counter and prescription medicines only as told by your health care provider. SEEK MEDICAL CARE IF:  Your symptoms do not improve after several days of treatment.  Your symptoms get worse.  You have difficulty moving the injured area. SEEK IMMEDIATE MEDICAL CARE IF:   You have severe pain.  You have numbness in a hand or foot.  Your hand or foot turns pale or cold.   This information is not intended to replace advice given to you by your health care provider. Make sure you discuss any questions you have with your health care provider.   Document Released: 12/23/2004 Document Revised: 12/04/2014 Document Reviewed: 07/31/2014 Elsevier Interactive  Patient Education [employer].

## 2015-10-13 NOTE — Progress Notes (Signed)
By signing my name below I, Tereasa Coop, attest that this documentation has been prepared under the direction and in the presence of Wendie Agreste, MD. Electonically Signed. Tereasa Coop, Scribe 10/13/2015 at 12:29 PM  Subjective:    Patient ID: [name], male    DOB: 1925-02-11, 82 y.o.   MRN: [id number]  Chief Complaint  Patient presents with  . Fall    hurt right arm and elbow, fell last wednesday, per patient did not hit head     HPI [name] is a 82 y.o. male who presents to the [facility] complaining of rt elbow pain that has been worsening since pt fell 5 days ago. Pt also reports worsening redness and swelling around elbow. Pt had some minor bleeding after fall. Pt could not get up after the fall and EMS came to help him get up and cleaned up his wounds. Pt denies cleaning the wound regularly and he kept the bandages over the wound for 4 days. Pt states he slipped while getting out of his recliner and bumped his rt elbow. Pt denies hitting head during fall. Pt denies any fever, chills, N/V/D.   Pt just finished 8 weeks of physical therapy due to general weakness and gait abnormality.   Pt has history of multiple medical problems.   Pt has A-fib and history of DVT and is on eliquis.     Patient Active Problem List   Diagnosis Date Noted  . Pleural effusion 01/23/2015  . Acute respiratory failure with hypoxia ([place]) 01/07/2015  . PAF (paroxysmal atrial fibrillation) ([place]) 01/07/2015  . Acute respiratory failure ([place]) 01/07/2015  . Shortness of breath 01/06/2015  . Patellar tendinitis of right knee 12/12/2014  . Adenocarcinoma of right lung, stage 1 ([place]) 12/11/2014  . Traumatic rotator cuff tear 11/07/2014  . Compression fracture of L1 lumbar vertebra (HCC) 11/07/2014  . Lumbar degenerative disc disease 09/11/2014  . Osteoarthritis of right knee 09/11/2014  . Chronic anticoagulation 11/22/2012  . DYSLIPIDEMIA 08/06/2008  . COLONIC  POLYPS, ADENOMATOUS 07/17/2008  . Essential hypertension 07/17/2008  . Atrial fibrillation ([place]) 07/17/2008  . BRADYCARDIA 07/17/2008  . HEMORRHOIDS 07/17/2008  . Asthma 07/17/2008  . DIVERTICULOSIS, COLON 07/17/2008  . PALPITATIONS 07/17/2008   Past Medical History  Diagnosis Date  . Bradycardia   . Palpitations   . Asthma   . Hemorrhoids   . Hx of adenomatous colonic polyps 02/1997  . Atrial fibrillation ([place])   . Hypertension   . Diverticulosis of colon   . COPD (chronic obstructive pulmonary disease) (HCC)     mild  . Bowel obstruction ([place])   . Arthritis     osteoarthritis  . Cancer ([place])     skin cancer  . 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 Adenocarcinoma of right lung, stage 1 ([place]) 12/11/2014   Past Surgical History  Procedure Laterality Date  . Colon resection    . Total hip arthroplasty Right   . Appendectomy    . Laminectomy      Right L4-L5 Laminectomy  . Cystoscopy    . Rotator cuff repair Right   . Umbilical hernia repair    . Video bronchoscopy with endobronchial navigation N/A 12/18/2014    Procedure: VIDEO BRONCHOSCOPY WITH ENDOBRONCHIAL NAVIGATION ;  Surgeon: Collene Gobble, MD;  Location: [facility] OR;  Service: Thoracic;  Laterality: N/A;   Allergies  Allergen Reactions  . Sulfa Antibiotics Other (See Comments)    Unsure of reaction  .  Sulfonamide Derivatives Other (See Comments)    Unk   Prior to Admission medications   Medication Sig Start Date End Date Taking? Authorizing Provider  alendronate (FOSAMAX) 70 MG tablet Take 70 mg by mouth once a week. Take with a full glass of water on an empty stomach.   Yes Historical Provider, MD  apixaban (ELIQUIS) 5 MG TABS tablet Take 5 mg by mouth 2 (two) times daily.    Yes Historical Provider, MD  calcium-vitamin D (OSCAL WITH D) 500-200 MG-UNIT tablet Take 1 tablet by mouth daily.   Yes Historical Provider, MD  cetirizine (ZYRTEC) 10 MG tablet Take 10 mg by mouth daily.   Yes Historical Provider, MD  Cetirizine  HCl (ZYRTEC ALLERGY PO) Take 10 mg by mouth at bedtime.    Yes Historical Provider, MD  CRESTOR 10 MG tablet Take 5 mg by mouth at bedtime. Reported on 08/26/2015 08/19/14  Yes Historical Provider, MD  finasteride (PROSCAR) 5 MG tablet Take 5 mg by mouth daily.   Yes Historical Provider, MD  fluticasone (FLONASE) 50 MCG/ACT nasal spray Place 2 sprays into both nostrils every morning.   Yes Historical Provider, MD  Fluticasone-Salmeterol (ADVAIR) 250-50 MCG/DOSE AEPB Inhale 1 puff into the lungs 2 (two) times daily.   Yes Historical Provider, MD  furosemide (LASIX) 20 MG tablet Take 20 mg by mouth daily as needed for fluid.   Yes Historical Provider, MD  Guaifenesin 1200 MG TB12 Take 1,200 mg by mouth daily as needed.    Yes Historical Provider, MD  levalbuterol [facility] HFA) 45 MCG/ACT inhaler Inhale into the lungs every 4 (four) hours as needed for wheezing.   Yes Historical Provider, MD  montelukast (SINGULAIR) 10 MG tablet Take 10 mg by mouth at bedtime.   Yes Historical Provider, MD  omeprazole (PRILOSEC) 20 MG capsule Take 20 mg by mouth. Reported on 07/25/2015 02/18/15  Yes Historical Provider, MD  Probiotic Product (PROBIOTIC PO) Take 1 capsule by mouth daily.   Yes Historical Provider, MD  temazepam (RESTORIL) 15 MG capsule Take 15 mg by mouth at bedtime as needed for sleep.   Yes Historical Provider, MD  Tiotropium Bromide Monohydrate (SPIRIVA RESPIMAT) 2.5 MCG/ACT AERS Inhale 2 puffs into the lungs daily.   Yes Historical Provider, MD  verapamil (COVERA HS) 180 MG (CO) 24 hr tablet Take 1 tablet (180 mg total) by mouth at bedtime. Patient taking differently: Take 180 mg by mouth every evening.  01/16/13  Yes Evans Lance, MD  XOPENEX HFA 45 MCG/ACT inhaler 1-2 puffs every 6 (six) hours as needed for shortness of breath.  09/10/14  Yes Historical Provider, MD  zolpidem (AMBIEN) 10 MG tablet Take 10 mg by mouth at bedtime.    Yes Historical Provider, MD   Social History   Social History  .  Marital Status: Married    Spouse Name: N/A  . Number of Children: N/A  . Years of Education: N/A   Occupational History  . retired physician    Social History Main Topics  . Smoking status: Former Smoker -- 0.00 packs/day for 13 years    Types: Cigarettes    Quit date: 03/29/1956  . Smokeless tobacco: Never Used  . Alcohol Use: Yes     Comment: wine every night   . Drug Use: No  . Sexual Activity: Not on file   Other Topics Concern  . Not on file   Social History Narrative   Retired Museum/gallery exhibitions officer [employer]  Review of Systems  Constitutional: Negative for fever and chills.  Gastrointestinal: Negative for nausea, vomiting and diarrhea.  Musculoskeletal: Positive for joint swelling (rt elbow).  Skin: Positive for rash (rt arm) and wound (rt elbow).       Objective:   Physical Exam  Constitutional: He is oriented to person, place, and time. He appears well-developed and well-nourished. No distress.  HENT:  Head: Normocephalic and atraumatic.  Eyes: Conjunctivae are normal. Pupils are equal, round, and reactive to light.  Neck: Neck supple.  Cardiovascular: Normal rate.   Pulmonary/Chest: Effort normal.  Musculoskeletal: Normal range of motion.  Pt can pronate and supinate arms bilat with no difficulty. Pt has full ROM in rt elbow with some pain with flexion and extension of rt elbow.   Neurological: He is alert and oriented to person, place, and time.  Skin: Skin is warm and dry.  RT arm: Diffuse erythema from 3-4 cm above elbow to dorsum of hand, stopping at MCP joints. Volar arm erythematous from above elbow to wrist. Swelling of soft tissue of arm to hand, fingers uninvolved, cap refill less than 1 second in finger tips. There is tenderness in the proximal ulna. Pt also has 2 wounds with overlying scabs on olecranon surface with swelling over olecranon. Radial head is non-tender.  Psychiatric: He has a normal mood and affect. His behavior is normal.    Nursing note and vitals reviewed.    Filed Vitals:   10/13/15 1135  BP: 118/72  Pulse: 76  Temp: 97.9 F (36.6 C)  TempSrc: Oral  Resp: 16  Height: '5\' 10"'$  (1.778 m)  Weight: 172 lb 3.2 oz (78.109 kg)  SpO2: 94%   Results for orders placed or performed in visit on 10/13/15  POCT CBC  Result Value Ref Range   WBC 8.9 4.6 - 10.2 K/uL   Lymph, poc 1.5 0.6 - 3.4   POC LYMPH PERCENT 16.4 10 - 50 %L   MID (cbc) 0.7 0 - 0.9   POC MID % 8.0 0 - 12 %M   POC Granulocyte 6.7 2 - 6.9   Granulocyte percent 75.6 37 - 80 %G   RBC 3.82 (A) 4.69 - 6.13 M/uL   Hemoglobin 12.3 (A) 14.1 - 18.1 g/dL   HCT, POC 36.1 (A) 43.5 - 53.7 %   MCV 94.5 80 - 97 fL   MCH, POC 32.1 (A) 27 - 31.2 pg   MCHC 34.0 31.8 - 35.4 g/dL   RDW, POC 19.4 %   Platelet Count, POC 191 142 - 424 K/uL   MPV 6.6 0 - 99.8 fL   Dg Elbow Complete Right (3+view)  10/13/2015  CLINICAL DATA:  Status post fall onto the elbow 5 days ago. Persistent olecranon region pain with swelling and redness EXAM: RIGHT ELBOW - COMPLETE 3+ VIEW COMPARISON:  None in PACs FINDINGS: There is a large amount of soft tissue swelling over the extensor aspect of the elbow. There is a tiny olecranon spur. No olecranon fracture is observed. The radial head is intact. The condylar and supracondylar regions of the distal humerus appear normal. IMPRESSION: Olecranon spurring with soft tissue swelling consistent with olecranon bursitis or hematoma. No acute elbow fracture nor dislocation is observed. Electronically Signed   By: David  Martinique M.D.   On: 10/13/2015 12:18        Assessment & Plan:   [name] is a 82 y.o. male Right elbow pain - Plan: DG ELBOW COMPLETE RIGHT (3+VIEW)  Cellulitis  of arm, right - Plan: POCT CBC, doxycycline (VIBRA-TABS) 100 MG tablet  Wound, open, arm, forearm, right, initial encounter - Plan: POCT CBC  Contusion with small abrasion/wounds, now with secondary cellulitis. No apparent fracture on x-ray, and overall  range of motion is intact.  - Symptomatic care for abrasion/wound care, cleansing with soap and water, bandage as needed.   -Start doxycycline 100 mg twice a day for secondary cellulitis. Area was outlined with marker, recheck in 24-48 hours, sooner if worse.  Meds ordered this encounter  Medications  . doxycycline (VIBRA-TABS) 100 MG tablet    Sig: Take 1 tablet (100 mg total) by mouth 2 (two) times daily.    Dispense:  20 tablet    Refill:  0   Patient Instructions       IF you received an x-ray today, you will receive an invoice from [facility]. Please contact [facility] at [phone] with questions or concerns regarding your invoice.   IF you received labwork today, you will receive an invoice from [employer]. Please contact [facility] at [phone] with questions or concerns regarding your invoice.   Our billing staff will not be able to assist you with questions regarding bills from these companies.  You will be contacted with the lab results as soon as they are available. The fastest way to get your results is to activate your My Chart account. Instructions are located on the last page of this paperwork. If you have not heard from [place] regarding the results in 2 weeks, please contact this office.     The radiologist did not see any fractures are broken bones. There was some swelling around the elbow consistent with a contusion. I suspect the swelling and redness is due to cellulitis, so we'll start doxycycline. Recheck in the next 24-48 hours to evaluate this infection and make sure you're still stable for outpatient treatment. If you have fevers, chills, or rapidly spreading redness, be seen through the emergency room.  Return to the clinic or go to the nearest emergency room if any of your symptoms worsen or new symptoms occur.  Cellulitis Cellulitis is an infection of the skin and the tissue beneath it. The infected area is  usually red and tender. Cellulitis occurs most often in the arms and lower legs.  CAUSES  Cellulitis is caused by bacteria that enter the skin through cracks or cuts in the skin. The most common types of bacteria that cause cellulitis are staphylococci and streptococci. SIGNS AND SYMPTOMS   Redness and warmth.  Swelling.  Tenderness or pain.  Fever. DIAGNOSIS  Your health care provider can usually determine what is wrong based on a physical exam. Blood tests may also be done. TREATMENT  Treatment usually involves taking an antibiotic medicine. HOME CARE INSTRUCTIONS   Take your antibiotic medicine as directed by your health care provider. Finish the antibiotic even if you start to feel better.  Keep the infected arm or leg elevated to reduce swelling.  Apply a warm cloth to the affected area up to 4 times per day to relieve pain.  Take medicines only as directed by your health care provider.  Keep all follow-up visits as directed by your health care provider. SEEK MEDICAL CARE IF:   You notice red streaks coming from the infected area.  Your red area gets larger or turns dark in color.  Your bone or joint underneath the infected area becomes painful after the skin has healed.  Your infection  returns in the same area or another area.  You notice a swollen bump in the infected area.  You develop new symptoms.  You have a fever. SEEK IMMEDIATE MEDICAL CARE IF:   You feel very sleepy.  You develop vomiting or diarrhea.  You have a general ill feeling (malaise) with muscle aches and pains.   This information is not intended to replace advice given to you by your health care provider. Make sure you discuss any questions you have with your health care provider.   Document Released: 12/23/2004 Document Revised: 12/04/2014 Document Reviewed: 05/31/2011 Elsevier Interactive Patient Education 2016 [place] A contusion is a deep bruise. Contusions are the  result of a blunt injury to tissues and muscle fibers under the skin. The injury causes bleeding under the skin. The skin overlying the contusion may turn blue, purple, or yellow. Minor injuries will give you a painless contusion, but more severe contusions may stay painful and swollen for a few weeks.  CAUSES  This condition is usually caused by a blow, trauma, or direct force to an area of the body. SYMPTOMS  Symptoms of this condition include:  Swelling of the injured area.  Pain and tenderness in the injured area.  Discoloration. The area may have redness and then turn blue, purple, or yellow. DIAGNOSIS  This condition is diagnosed based on a physical exam and medical history. An X-ray, CT scan, or MRI may be needed to determine if there are any associated injuries, such as broken bones (fractures). TREATMENT  Specific treatment for this condition depends on what area of the body was injured. In general, the best treatment for a contusion is resting, icing, applying pressure to (compression), and elevating the injured area. This is often called the RICE strategy. Over-the-counter anti-inflammatory medicines may also be recommended for pain control.  HOME CARE INSTRUCTIONS   Rest the injured area.  If directed, apply ice to the injured area:  Put ice in a plastic bag.  Place a towel between your skin and the bag.  Leave the ice on for 20 minutes, 2-3 times per day.  If directed, apply light compression to the injured area using an elastic bandage. Make sure the bandage is not wrapped too tightly. Remove and reapply the bandage as directed by your health care provider.  If possible, raise (elevate) the injured area above the level of your heart while you are sitting or lying down.  Take over-the-counter and prescription medicines only as told by your health care provider. SEEK MEDICAL CARE IF:  Your symptoms do not improve after several days of treatment.  Your symptoms get  worse.  You have difficulty moving the injured area. SEEK IMMEDIATE MEDICAL CARE IF:   You have severe pain.  You have numbness in a hand or foot.  Your hand or foot turns pale or cold.   This information is not intended to replace advice given to you by your health care provider. Make sure you discuss any questions you have with your health care provider.   Document Released: 12/23/2004 Document Revised: 12/04/2014 Document Reviewed: 07/31/2014 Elsevier Interactive Patient Education [employer].     I personally performed the services described in this documentation, which was scribed in my presence. The recorded information has been reviewed and considered, and addended by me as needed.   Signed,   [name], MD [facility].  10/13/2015 3:04 PM

## 2015-10-14 ENCOUNTER — Ambulatory Visit (INDEPENDENT_AMBULATORY_CARE_PROVIDER_SITE_OTHER): Payer: Medicare Other | Admitting: Family Medicine

## 2015-10-14 VITALS — BP 106/60 | HR 78 | Temp 98.3°F | Resp 18 | Ht 69.0 in | Wt 174.2 lb

## 2015-10-14 DIAGNOSIS — M25421 Effusion, right elbow: Secondary | ICD-10-CM

## 2015-10-14 DIAGNOSIS — L03113 Cellulitis of right upper limb: Secondary | ICD-10-CM | POA: Diagnosis not present

## 2015-10-14 LAB — POCT CBC
GRANULOCYTE PERCENT: 75.2 % (ref 37–80)
HCT, POC: 33.6 % — AB (ref 43.5–53.7)
Hemoglobin: 11.5 g/dL — AB (ref 14.1–18.1)
Lymph, poc: 1.2 (ref 0.6–3.4)
MCH, POC: 32.2 pg — AB (ref 27–31.2)
MCHC: 34.2 g/dL (ref 31.8–35.4)
MCV: 94.1 fL (ref 80–97)
MID (cbc): 0.8 (ref 0–0.9)
MPV: 7.2 fL (ref 0–99.8)
PLATELET COUNT, POC: 180 10*3/uL (ref 142–424)
POC Granulocyte: 6.1 (ref 2–6.9)
POC LYMPH %: 15 % (ref 10–50)
POC MID %: 9.8 %M (ref 0–12)
RBC: 3.57 M/uL — AB (ref 4.69–6.13)
RDW, POC: 20 %
WBC: 8.1 10*3/uL (ref 4.6–10.2)

## 2015-10-14 MED ORDER — CEPHALEXIN 500 MG PO CAPS
500.0000 mg | ORAL_CAPSULE | Freq: Two times a day (BID) | ORAL | Status: DC
Start: 2015-10-14 — End: 2015-11-27

## 2015-10-14 NOTE — Progress Notes (Addendum)
By signing my name below, I, [name], attest that this documentation has been prepared under the direction and in the presence of [name] Ray, MD.  Electronically Signed: Verlee [name], Medical Scribe. 10/14/2015. 10:16 AM.  Subjective:    Patient ID: [name], male    DOB: 1924-09-29, 82 y.o.   MRN: [id number]  HPI Chief Complaint  Patient presents with  . Follow-up    Rt Arm     HPI Comments: [name] is a 82 y.o. male who presents to the [facility] for right arm cellulitis follow-up after contusion 6 days ago. X-[name] negative for fracture yesterday and started on doxycycline. Pt sates there is more swelling, and the redness has spread. Pt mentions there is less pain in his right arm. Pt states he's capable of moving his elbow more comfortably today. Pt has been putting his arm on a pillow while he sleeps. Pt mentions he felt bad last night, took doxycycline without liquid and had "terrible" heart burn. Pt states he couldn't eat anything after. Pt's states he had a low-grade fever last night 99.3, but his baseline temperature is normally at 98 degrees. Pt denies tingling and numbness in his hand.  Patient Active Problem List   Diagnosis Date Noted  . Pleural effusion 01/23/2015  . Acute respiratory failure with hypoxia ([place]) 01/07/2015  . PAF (paroxysmal atrial fibrillation) ([place]) 01/07/2015  . Acute respiratory failure ([place]) 01/07/2015  . Shortness of breath 01/06/2015  . Patellar tendinitis of right knee 12/12/2014  . Adenocarcinoma of right lung, stage 1 ([place]) 12/11/2014  . Traumatic rotator cuff tear 11/07/2014  . Compression fracture of L1 lumbar vertebra (HCC) 11/07/2014  . Lumbar degenerative disc disease 09/11/2014  . Osteoarthritis of right knee 09/11/2014  . Chronic anticoagulation 11/22/2012  . DYSLIPIDEMIA 08/06/2008  . COLONIC POLYPS, ADENOMATOUS 07/17/2008  . Essential hypertension 07/17/2008  . Atrial fibrillation ([place])  07/17/2008  . BRADYCARDIA 07/17/2008  . HEMORRHOIDS 07/17/2008  . Asthma 07/17/2008  . DIVERTICULOSIS, COLON 07/17/2008  . PALPITATIONS 07/17/2008   Past Medical History  Diagnosis Date  . Bradycardia   . Palpitations   . Asthma   . Hemorrhoids   . Hx of adenomatous colonic polyps 02/1997  . Atrial fibrillation ([place])   . Hypertension   . Diverticulosis of colon   . COPD (chronic obstructive pulmonary disease) (HCC)     mild  . Bowel obstruction ([place])   . Arthritis     osteoarthritis  . Cancer ([place])     skin cancer  . 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 Adenocarcinoma of right lung, stage 1 ([place]) 12/11/2014   Past Surgical History  Procedure Laterality Date  . Colon resection    . Total hip arthroplasty Right   . Appendectomy    . Laminectomy      Right L4-L5 Laminectomy  . Cystoscopy    . Rotator cuff repair Right   . Umbilical hernia repair    . Video bronchoscopy with endobronchial navigation N/A 12/18/2014    Procedure: VIDEO BRONCHOSCOPY WITH ENDOBRONCHIAL NAVIGATION ;  Surgeon: Collene Gobble, MD;  Location: [facility] OR;  Service: Thoracic;  Laterality: N/A;   Allergies  Allergen Reactions  . Sulfa Antibiotics Other (See Comments)    Unsure of reaction  . Sulfonamide Derivatives Other (See Comments)    Unk   Prior to Admission medications   Medication Sig Start Date End Date Taking? Authorizing Provider  alendronate (FOSAMAX) 70 MG tablet Take 70  mg by mouth once a week. Take with a full glass of water on an empty stomach.   Yes Historical Provider, MD  apixaban (ELIQUIS) 5 MG TABS tablet Take 5 mg by mouth 2 (two) times daily.    Yes Historical Provider, MD  calcium-vitamin D (OSCAL WITH D) 500-200 MG-UNIT tablet Take 1 tablet by mouth daily.   Yes Historical Provider, MD  cetirizine (ZYRTEC) 10 MG tablet Take 10 mg by mouth daily.   Yes Historical Provider, MD  Cetirizine HCl (ZYRTEC ALLERGY PO) Take 10 mg by mouth at bedtime.    Yes Historical Provider, MD  CRESTOR 10  MG tablet Take 5 mg by mouth at bedtime. Reported on 08/26/2015 08/19/14  Yes Historical Provider, MD  doxycycline (VIBRA-TABS) 100 MG tablet Take 1 tablet (100 mg total) by mouth 2 (two) times daily. 10/13/15  Yes Wendie Agreste, MD  finasteride (PROSCAR) 5 MG tablet Take 5 mg by mouth daily.   Yes Historical Provider, MD  fluticasone (FLONASE) 50 MCG/ACT nasal spray Place 2 sprays into both nostrils every morning.   Yes Historical Provider, MD  Fluticasone-Salmeterol (ADVAIR) 250-50 MCG/DOSE AEPB Inhale 1 puff into the lungs 2 (two) times daily.   Yes Historical Provider, MD  furosemide (LASIX) 20 MG tablet Take 20 mg by mouth daily as needed for fluid.   Yes Historical Provider, MD  Guaifenesin 1200 MG TB12 Take 1,200 mg by mouth daily as needed.    Yes Historical Provider, MD  levalbuterol [facility] HFA) 45 MCG/ACT inhaler Inhale into the lungs every 4 (four) hours as needed for wheezing.   Yes Historical Provider, MD  montelukast (SINGULAIR) 10 MG tablet Take 10 mg by mouth at bedtime.   Yes Historical Provider, MD  omeprazole (PRILOSEC) 20 MG capsule Take 20 mg by mouth. Reported on 07/25/2015 02/18/15  Yes Historical Provider, MD  Probiotic Product (PROBIOTIC PO) Take 1 capsule by mouth daily.   Yes Historical Provider, MD  temazepam (RESTORIL) 15 MG capsule Take 15 mg by mouth at bedtime as needed for sleep.   Yes Historical Provider, MD  Tiotropium Bromide Monohydrate (SPIRIVA RESPIMAT) 2.5 MCG/ACT AERS Inhale 2 puffs into the lungs daily.   Yes Historical Provider, MD  verapamil (COVERA HS) 180 MG (CO) 24 hr tablet Take 1 tablet (180 mg total) by mouth at bedtime. Patient taking differently: Take 180 mg by mouth every evening.  01/16/13  Yes Evans Lance, MD  XOPENEX HFA 45 MCG/ACT inhaler 1-2 puffs every 6 (six) hours as needed for shortness of breath.  09/10/14  Yes Historical Provider, MD  zolpidem (AMBIEN) 10 MG tablet Take 10 mg by mouth at bedtime.    Yes Historical Provider, MD    Social History   Social History  . Marital Status: Married    Spouse Name: N/A  . Number of Children: N/A  . Years of Education: N/A   Occupational History  . retired physician    Social History Main Topics  . Smoking status: Former Smoker -- 0.00 packs/day for 13 years    Types: Cigarettes    Quit date: 03/29/1956  . Smokeless tobacco: Never Used  . Alcohol Use: Yes     Comment: wine every night   . Drug Use: No  . Sexual Activity: Not on file   Other Topics Concern  . 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  Skin:  Positive for color change. Negative for rash.  Neurological: Negative for numbness.    Objective:  BP 106/60 mmHg  Pulse 78  Temp(Src) 98.3 F (36.8 C) (Oral)  Resp 18  Ht '5\' 9"'$  (1.753 m)  Wt 174 lb 3.2 oz (79.017 kg)  BMI 25.71 kg/m2  SpO2 96%  Physical Exam  Constitutional: He appears well-developed and well-nourished. No distress.  HENT:  Head: Normocephalic and atraumatic.  Eyes: Conjunctivae are normal.  Neck: Neck supple.  Cardiovascular: Normal rate.   Radial pulse 2+  Pulmonary/Chest: Effort normal.  Neurological: He is alert.  Sensation intact disatally  Skin: Skin is warm and dry.  Redness compared to yesterday has been increased: right forearm to right hand, extends to [facility] Cap refill less than 1 second Erythema extends approximately 1-2 cm from the hand markings, as well as on the upper inner arm, but same location as outer arm.  Psychiatric: He has a normal mood and affect. His behavior is normal.  Nursing note and vitals reviewed.  Results for orders placed or performed in visit on 10/14/15  POCT CBC  Result Value Ref Range   WBC 8.1 4.6 - 10.2 K/uL   Lymph, poc 1.2 0.6 - 3.4   POC LYMPH PERCENT 15.0 10 - 50 %L   MID (cbc) 0.8 0 - 0.9   POC MID % 9.8 0 - 12 %M   POC Granulocyte 6.1 2 - 6.9   Granulocyte percent 75.2 37  - 80 %G   RBC 3.57 (A) 4.69 - 6.13 M/uL   Hemoglobin 11.5 (A) 14.1 - 18.1 g/dL   HCT, POC 33.6 (A) 43.5 - 53.7 %   MCV 94.1 80 - 97 fL   MCH, POC 32.2 (A) 27 - 31.2 pg   MCHC 34.2 31.8 - 35.4 g/dL   RDW, POC 20.0 %   Platelet Count, POC 180 142 - 424 K/uL   MPV 7.2 0 - 99.8 fL   Assessment & Plan:   [name] is a 82 y.o. male Swelling of joint of upper arm, right - Plan: POCT CBC, cephALEXin (KEFLEX) 500 MG capsule  Cellulitis of arm, right - Plan: POCT CBC, cephALEXin (KEFLEX) 500 MG capsule Wounds of arm with secondary cellulitis. Minimal spread of erythema, but increased edema of right lower arm. No signs of compartment syndrome, functionally moving better, no fracture seen on x-[name]. NVi distally.  - Will add Keflex 500 mg twice a day for possible strep coverage., continue doxycycline 100 mg twice a day, and recheck/return to clinic precautions discussed.   Meds ordered this encounter  Medications  . cephALEXin (KEFLEX) 500 MG capsule    Sig: Take 1 capsule (500 mg total) by mouth 2 (two) times daily.    Dispense:  20 capsule    Refill:  0   Patient Instructions        IF you received an x-[name] today, you will receive an invoice from [facility]. Please contact [facility] at [phone] with questions or concerns regarding your invoice.   IF you received labwork today, you will receive an invoice from [employer]. Please contact [facility] at [phone] with questions or concerns regarding your invoice.   Our billing staff will not be able to assist you with questions regarding bills from these companies.  You will be contacted with the lab results as soon as they are available. The fastest way to get your results is to activate your My Chart account. Instructions are located  on the last page of this paperwork. If you have not heard from [place] regarding the results in 2 weeks, please contact this office.     Continue  doxycycline, start Keflex one pill twice per day. Follow-up in the next 24-48 hours for recheck, sooner if worse. I'm here tomorrow after 1 PM, and appointments only on Thursday. If I have an opening in my appointments on Thursday, get an appointment to see me then. Let me know if you have questions in the meantime.  Cellulitis Cellulitis is an infection of the skin and the tissue beneath it. The infected area is usually red and tender. Cellulitis occurs most often in the arms and lower legs.  CAUSES  Cellulitis is caused by bacteria that enter the skin through cracks or cuts in the skin. The most common types of bacteria that cause cellulitis are staphylococci and streptococci. SIGNS AND SYMPTOMS   Redness and warmth.  Swelling.  Tenderness or pain.  Fever. DIAGNOSIS  Your health care provider can usually determine what is wrong based on a physical exam. Blood tests may also be done. TREATMENT  Treatment usually involves taking an antibiotic medicine. HOME CARE INSTRUCTIONS   Take your antibiotic medicine as directed by your health care provider. Finish the antibiotic even if you start to feel better.  Keep the infected arm or leg elevated to reduce swelling.  Apply a warm cloth to the affected area up to 4 times per day to relieve pain.  Take medicines only as directed by your health care provider.  Keep all follow-up visits as directed by your health care provider. SEEK MEDICAL CARE IF:   You notice red streaks coming from the infected area.  Your red area gets larger or turns dark in color.  Your bone or joint underneath the infected area becomes painful after the skin has healed.  Your infection returns in the same area or another area.  You notice a swollen bump in the infected area.  You develop new symptoms.  You have a fever. SEEK IMMEDIATE MEDICAL CARE IF:   You feel very sleepy.  You develop vomiting or diarrhea.  You have a general ill feeling (malaise)  with muscle aches and pains.   This information is not intended to replace advice given to you by your health care provider. Make sure you discuss any questions you have with your health care provider.   Document Released: 12/23/2004 Document Revised: 12/04/2014 Document Reviewed: 05/31/2011 Elsevier Interactive Patient Education [employer].      I personally performed the services described in this documentation, which was scribed in my presence. The recorded information has been reviewed and considered, and addended by me as needed.   Signed,   [name] Ray, MD [facility].  10/14/2015 11:07 AM

## 2015-10-14 NOTE — Patient Instructions (Addendum)
     IF you received an x-ray today, you will receive an invoice from [facility]. Please contact [facility] at [phone] with questions or concerns regarding your invoice.   IF you received labwork today, you will receive an invoice from [employer]. Please contact [facility] at [phone] with questions or concerns regarding your invoice.   Our billing staff will not be able to assist you with questions regarding bills from these companies.  You will be contacted with the lab results as soon as they are available. The fastest way to get your results is to activate your My Chart account. Instructions are located on the last page of this paperwork. If you have not heard from [place] regarding the results in 2 weeks, please contact this office.     Continue doxycycline, start Keflex one pill twice per day. Follow-up in the next 24-48 hours for recheck, sooner if worse. I'm here tomorrow after 1 PM, and appointments only on Thursday. If I have an opening in my appointments on Thursday, get an appointment to see me then. Let me know if you have questions in the meantime.  Cellulitis Cellulitis is an infection of the skin and the tissue beneath it. The infected area is usually red and tender. Cellulitis occurs most often in the arms and lower legs.  CAUSES  Cellulitis is caused by bacteria that enter the skin through cracks or cuts in the skin. The most common types of bacteria that cause cellulitis are staphylococci and streptococci. SIGNS AND SYMPTOMS   Redness and warmth.  Swelling.  Tenderness or pain.  Fever. DIAGNOSIS  Your health care provider can usually determine what is wrong based on a physical exam. Blood tests may also be done. TREATMENT  Treatment usually involves taking an antibiotic medicine. HOME CARE INSTRUCTIONS   Take your antibiotic medicine as directed by your health care provider. Finish the antibiotic even if you start  to feel better.  Keep the infected arm or leg elevated to reduce swelling.  Apply a warm cloth to the affected area up to 4 times per day to relieve pain.  Take medicines only as directed by your health care provider.  Keep all follow-up visits as directed by your health care provider. SEEK MEDICAL CARE IF:   You notice red streaks coming from the infected area.  Your red area gets larger or turns dark in color.  Your bone or joint underneath the infected area becomes painful after the skin has healed.  Your infection returns in the same area or another area.  You notice a swollen bump in the infected area.  You develop new symptoms.  You have a fever. SEEK IMMEDIATE MEDICAL CARE IF:   You feel very sleepy.  You develop vomiting or diarrhea.  You have a general ill feeling (malaise) with muscle aches and pains.   This information is not intended to replace advice given to you by your health care provider. Make sure you discuss any questions you have with your health care provider.   Document Released: 12/23/2004 Document Revised: 12/04/2014 Document Reviewed: 05/31/2011 Elsevier Interactive Patient Education [employer].

## 2015-10-15 ENCOUNTER — Observation Stay (HOSPITAL_COMMUNITY)
Admission: AD | Admit: 2015-10-15 | Discharge: 2015-10-17 | DRG: 603 | Disposition: A | Discharge status: Home / Self Care | Payer: Medicare Other | Source: Ambulatory Visit | Attending: Family Medicine | Admitting: Family Medicine

## 2015-10-15 ENCOUNTER — Encounter (HOSPITAL_COMMUNITY): Payer: Self-pay | Admitting: *Deleted

## 2015-10-15 DIAGNOSIS — Z7901 Long term (current) use of anticoagulants: Secondary | ICD-10-CM | POA: Diagnosis not present

## 2015-10-15 DIAGNOSIS — G4700 Insomnia, unspecified: Secondary | ICD-10-CM | POA: Diagnosis present

## 2015-10-15 DIAGNOSIS — C3491 Malignant neoplasm of unspecified part of right bronchus or lung: Secondary | ICD-10-CM | POA: Diagnosis present

## 2015-10-15 DIAGNOSIS — Z7952 Long term (current) use of systemic steroids: Secondary | ICD-10-CM

## 2015-10-15 DIAGNOSIS — Z79899 Other long term (current) drug therapy: Secondary | ICD-10-CM

## 2015-10-15 DIAGNOSIS — J449 Chronic obstructive pulmonary disease, unspecified: Secondary | ICD-10-CM | POA: Diagnosis not present

## 2015-10-15 DIAGNOSIS — Z85828 Personal history of other malignant neoplasm of skin: Secondary | ICD-10-CM

## 2015-10-15 DIAGNOSIS — M1990 Unspecified osteoarthritis, unspecified site: Secondary | ICD-10-CM | POA: Diagnosis present

## 2015-10-15 DIAGNOSIS — I482 Chronic atrial fibrillation: Secondary | ICD-10-CM | POA: Diagnosis present

## 2015-10-15 DIAGNOSIS — I480 Paroxysmal atrial fibrillation: Secondary | ICD-10-CM | POA: Diagnosis present

## 2015-10-15 DIAGNOSIS — Z6824 Body mass index (BMI) 24.0-24.9, adult: Secondary | ICD-10-CM | POA: Diagnosis not present

## 2015-10-15 DIAGNOSIS — L03113 Cellulitis of right upper limb: Principal | ICD-10-CM | POA: Diagnosis present

## 2015-10-15 DIAGNOSIS — Z882 Allergy status to sulfonamides status: Secondary | ICD-10-CM

## 2015-10-15 DIAGNOSIS — I4821 Permanent atrial fibrillation: Secondary | ICD-10-CM | POA: Diagnosis present

## 2015-10-15 DIAGNOSIS — I10 Essential (primary) hypertension: Secondary | ICD-10-CM | POA: Diagnosis present

## 2015-10-15 DIAGNOSIS — Z96649 Presence of unspecified artificial hip joint: Secondary | ICD-10-CM | POA: Diagnosis present

## 2015-10-15 DIAGNOSIS — G8929 Other chronic pain: Secondary | ICD-10-CM | POA: Diagnosis present

## 2015-10-15 DIAGNOSIS — M545 Low back pain: Secondary | ICD-10-CM | POA: Diagnosis present

## 2015-10-15 DIAGNOSIS — J9610 Chronic respiratory failure, unspecified whether with hypoxia or hypercapnia: Secondary | ICD-10-CM | POA: Diagnosis present

## 2015-10-15 DIAGNOSIS — L0390 Cellulitis, unspecified: Secondary | ICD-10-CM | POA: Diagnosis present

## 2015-10-15 LAB — CBC WITH DIFFERENTIAL/PLATELET
Basophils Absolute: 0 10*3/uL (ref 0.0–0.1)
Basophils Relative: 0 %
EOS PCT: 0 %
Eosinophils Absolute: 0 10*3/uL (ref 0.0–0.7)
HCT: 35.4 % — ABNORMAL LOW (ref 39.0–52.0)
Hemoglobin: 11.6 g/dL — ABNORMAL LOW (ref 13.0–17.0)
LYMPHS ABS: 0.7 10*3/uL (ref 0.7–4.0)
LYMPHS PCT: 8 %
MCH: 31.9 pg (ref 26.0–34.0)
MCHC: 32.8 g/dL (ref 30.0–36.0)
MCV: 97.3 fL (ref 78.0–100.0)
MONO ABS: 0.9 10*3/uL (ref 0.1–1.0)
MONOS PCT: 10 %
Neutro Abs: 6.9 10*3/uL (ref 1.7–7.7)
Neutrophils Relative %: 82 %
PLATELETS: 195 10*3/uL (ref 150–400)
RBC: 3.64 MIL/uL — AB (ref 4.22–5.81)
RDW: 16.5 % — ABNORMAL HIGH (ref 11.5–15.5)
WBC: 8.5 10*3/uL (ref 4.0–10.5)

## 2015-10-15 LAB — C-REACTIVE PROTEIN: CRP: 4 mg/dL — AB (ref <1.0)

## 2015-10-15 LAB — LACTIC ACID, PLASMA: LACTIC ACID, VENOUS: 1.2 mmol/L (ref 0.5–1.9)

## 2015-10-15 LAB — PROTIME-INR
INR: 1.51 — AB (ref 0.00–1.49)
PROTHROMBIN TIME: 18.3 s — AB (ref 11.6–15.2)

## 2015-10-15 LAB — COMPREHENSIVE METABOLIC PANEL
ALT: 14 U/L — AB (ref 17–63)
AST: 23 U/L (ref 15–41)
Albumin: 3.8 g/dL (ref 3.5–5.0)
Alkaline Phosphatase: 58 U/L (ref 38–126)
Anion gap: 8 (ref 5–15)
BUN: 18 mg/dL (ref 6–20)
CALCIUM: 9.8 mg/dL (ref 8.9–10.3)
CHLORIDE: 99 mmol/L — AB (ref 101–111)
CO2: 29 mmol/L (ref 22–32)
CREATININE: 1.05 mg/dL (ref 0.61–1.24)
Glucose, Bld: 119 mg/dL — ABNORMAL HIGH (ref 65–99)
Potassium: 4.8 mmol/L (ref 3.5–5.1)
Sodium: 136 mmol/L (ref 135–145)
Total Bilirubin: 0.7 mg/dL (ref 0.3–1.2)
Total Protein: 6.6 g/dL (ref 6.5–8.1)

## 2015-10-15 LAB — SEDIMENTATION RATE: Sed Rate: 21 mm/hr — ABNORMAL HIGH (ref 0–16)

## 2015-10-15 LAB — APTT: aPTT: 37 seconds (ref 24–37)

## 2015-10-15 MED ORDER — ZOLPIDEM TARTRATE 5 MG PO TABS
5.0000 mg | ORAL_TABLET | Freq: Every day | ORAL | Status: DC
  Administered 2015-10-15 – 2015-10-16 (×2): 5 mg via ORAL
  Filled 2015-10-15 (×2): qty 1

## 2015-10-15 MED ORDER — TIOTROPIUM BROMIDE MONOHYDRATE 18 MCG IN CAPS
1.0000 | ORAL_CAPSULE | Freq: Every day | RESPIRATORY_TRACT | Status: DC
  Administered 2015-10-16 – 2015-10-17 (×2): 18 ug via RESPIRATORY_TRACT
  Filled 2015-10-15: qty 5

## 2015-10-15 MED ORDER — LEVALBUTEROL TARTRATE 45 MCG/ACT IN AERO: 1.0000 | INHALATION_SPRAY | Freq: Four times a day (QID) | RESPIRATORY_TRACT | Status: DC | PRN

## 2015-10-15 MED ORDER — FINASTERIDE 5 MG PO TABS
5.0000 mg | ORAL_TABLET | Freq: Every day | ORAL | Status: DC
  Administered 2015-10-16 – 2015-10-17 (×2): 5 mg via ORAL
  Filled 2015-10-15 (×2): qty 1

## 2015-10-15 MED ORDER — HYDROCODONE-ACETAMINOPHEN 5-325 MG PO TABS
1.0000 | ORAL_TABLET | ORAL | Status: DC | PRN
  Administered 2015-10-16 (×2): 2 via ORAL
  Filled 2015-10-15 (×2): qty 2

## 2015-10-15 MED ORDER — PIPERACILLIN-TAZOBACTAM 3.375 G IVPB 30 MIN
3.3750 g | Freq: Once | INTRAVENOUS | Status: DC
  Filled 2015-10-15: qty 50

## 2015-10-15 MED ORDER — ALBUTEROL SULFATE (2.5 MG/3ML) 0.083% IN NEBU: 2.5000 mg | INHALATION_SOLUTION | Freq: Four times a day (QID) | RESPIRATORY_TRACT | Status: DC | PRN

## 2015-10-15 MED ORDER — ACETAMINOPHEN 650 MG RE SUPP: 650.0000 mg | Freq: Four times a day (QID) | RECTAL | Status: DC | PRN

## 2015-10-15 MED ORDER — DEXTROSE 5 % IV SOLN
1.0000 g | INTRAVENOUS | Status: DC
  Administered 2015-10-15 – 2015-10-16 (×2): 1 g via INTRAVENOUS
  Filled 2015-10-15 (×2): qty 10

## 2015-10-15 MED ORDER — MOMETASONE FURO-FORMOTEROL FUM 200-5 MCG/ACT IN AERO
2.0000 | INHALATION_SPRAY | Freq: Two times a day (BID) | RESPIRATORY_TRACT | Status: DC
  Administered 2015-10-15 – 2015-10-17 (×4): 2 via RESPIRATORY_TRACT
  Filled 2015-10-15: qty 8.8

## 2015-10-15 MED ORDER — PANTOPRAZOLE SODIUM 40 MG PO TBEC
40.0000 mg | DELAYED_RELEASE_TABLET | Freq: Every day | ORAL | Status: DC
  Administered 2015-10-15 – 2015-10-17 (×3): 40 mg via ORAL
  Filled 2015-10-15 (×3): qty 1

## 2015-10-15 MED ORDER — PREDNISONE 5 MG PO TABS
10.0000 mg | ORAL_TABLET | Freq: Every day | ORAL | Status: DC
  Administered 2015-10-16 – 2015-10-17 (×2): 10 mg via ORAL
  Filled 2015-10-15 (×2): qty 2

## 2015-10-15 MED ORDER — APIXABAN 5 MG PO TABS
5.0000 mg | ORAL_TABLET | Freq: Two times a day (BID) | ORAL | Status: DC
  Administered 2015-10-15 – 2015-10-17 (×4): 5 mg via ORAL
  Filled 2015-10-15 (×4): qty 1

## 2015-10-15 MED ORDER — ONDANSETRON HCL 4 MG PO TABS: 4.0000 mg | ORAL_TABLET | Freq: Four times a day (QID) | ORAL | Status: DC | PRN

## 2015-10-15 MED ORDER — ROSUVASTATIN CALCIUM 10 MG PO TABS: 5.0000 mg | ORAL_TABLET | Freq: Every day | ORAL | Status: DC

## 2015-10-15 MED ORDER — VERAPAMIL HCL ER 180 MG PO TBCR
180.0000 mg | EXTENDED_RELEASE_TABLET | Freq: Every evening | ORAL | Status: DC
  Administered 2015-10-15 – 2015-10-16 (×2): 180 mg via ORAL
  Filled 2015-10-15 (×2): qty 1

## 2015-10-15 MED ORDER — ONDANSETRON HCL 4 MG/2ML IJ SOLN: 4.0000 mg | Freq: Four times a day (QID) | INTRAMUSCULAR | Status: DC | PRN

## 2015-10-15 MED ORDER — FLUTICASONE PROPIONATE 50 MCG/ACT NA SUSP
2.0000 | Freq: Every morning | NASAL | Status: DC
  Administered 2015-10-16: 2 via NASAL
  Filled 2015-10-15: qty 16

## 2015-10-15 MED ORDER — MONTELUKAST SODIUM 10 MG PO TABS
10.0000 mg | ORAL_TABLET | Freq: Every day | ORAL | Status: DC
  Administered 2015-10-15 – 2015-10-16 (×2): 10 mg via ORAL
  Filled 2015-10-15 (×2): qty 1

## 2015-10-15 MED ORDER — ACETAMINOPHEN 325 MG PO TABS: 650.0000 mg | ORAL_TABLET | Freq: Four times a day (QID) | ORAL | Status: DC | PRN

## 2015-10-15 MED ORDER — VANCOMYCIN HCL 10 G IV SOLR
1250.0000 mg | INTRAVENOUS | Status: DC
  Administered 2015-10-16: 1250 mg via INTRAVENOUS
  Filled 2015-10-15: qty 1250

## 2015-10-15 MED ORDER — VANCOMYCIN HCL IN DEXTROSE 1-5 GM/200ML-% IV SOLN
1000.0000 mg | Freq: Once | INTRAVENOUS | Status: AC
  Administered 2015-10-15: 1000 mg via INTRAVENOUS
  Filled 2015-10-15: qty 200

## 2015-10-15 NOTE — Progress Notes (Signed)
Pharmacy Antibiotic Note  [name] is a 82 y.o. male admitted on 10/15/2015 with nonpurulent cellulitis.  Seen at [facility] 7/17 and given doxycycline.  [facility] with no improvement and Keflex added. Now presenting with same. Pharmacy has been consulted for vancomycin and Rocephin dosing.  Plan:  Vancomycin 1000 mg IV now, then 1250 mg IV q24 hr; goal trough 10-15 mcg/mL for cellulitis w/o abscess  Measure vancomycin trough levels at steady state as indicated  Spoke with MD who agreed picture was not overly concerning for MRSA; consider narrowing quickly if prompt improvement  Rocephin 1g IV q24 hr   Weight: 174 lb (78.926 kg)  Temp (24hrs), Avg:97.5 F (36.4 C), Min:97.5 F (36.4 C), Max:97.5 F (36.4 C)   Recent Labs Lab 10/13/15 1225 10/14/15 1045  WBC 8.9 8.1    CrCl cannot be calculated (Patient has no serum creatinine result on file.).    Allergies  Allergen Reactions  . Sulfa Antibiotics Other (See Comments)    Unsure of reaction  . Sulfonamide Derivatives Other (See Comments)    Unk    Antimicrobials this admission: See PTA abx above Zosyn x 1  Vanc 7/19 >>  Rocephin 7/19 >>   Dose adjustments this admission: ---  Microbiology results: 7/19 BCx: sent  Thank you for allowing pharmacy to be a part of this patient's care.  Reuel Boom, PharmD, BCPS Pager: [phone] 10/15/2015, 8:08 PM

## 2015-10-15 NOTE — Progress Notes (Signed)
Flow manager made aware of patients admit to 1334.  Dr. Eulas Post is aware and will enter admit orders.

## 2015-10-15 NOTE — H&P (Signed)
History and Physical    [name] HDQ:[id number] DOB: 19-Apr-1924 DOA: 10/15/2015  PCP: [name], MD   Patient coming from: PCP's office  Chief Complaint: Progressive RUE cellulitis  HPI: [name] is a 82 y.o. gentleman with a history of chronic atrial fibrillation (anticoagulated with Eliquis, CHADS-Vasc score of three for HTN, age), lung cancer, chronic respiratory failure, HTN, and chronic low back pain who presents for direct admission after failing outpatient therapy for a RUE cellulitis.  The patient is accompanied by his daughter Eustaquio Maize, who is his healthcare POA.  He still lives independently and reports an accidental fall at home (slipped on a piece of newspaper on a wood floor) one week ago.  He denies loss of consciousness, but he hit his arm, presumably on a chair, while falling to the ground.  He developed two abrasions that eventually scabbed over.  However, by Monday, he presented to an urgent care center for evaluation of progressive swelling and erythema in his right upper extremity.  RUE xrays at that time suggested olecranon bursitis or hematoma, but there was no evidence of acute fracture or dislocation.  He was given a prescription for doxycycline.  He followed up the next day and reported low grade fever to 99.3.  He was given a prescription for keflex, to be taken in addition to the doxycycline.  He was seen by his PCP today, who referred him for direct admission for IV antibiotics.   At this time, he denies recurrent fever, chills, or sweats.  No nausea or vomiting.  No chest pain or shortness of breath.  No change in his chronic low back pain.  No pain localized to any other joints.  No change in his bowel or bladder habits.  He has taken acetaminophen as needed for pain.  Review of Systems: As per HPI otherwise 10 point review of systems negative.    Past Medical History  Diagnosis Date  . Bradycardia   . Palpitations   . Asthma   . Hemorrhoids   . Hx of  adenomatous colonic polyps 02/1997  . Atrial fibrillation ([place])   . Hypertension   . Diverticulosis of colon   . COPD (chronic obstructive pulmonary disease) (HCC)     mild  . Bowel obstruction ([place])   . Arthritis     osteoarthritis  . Cancer ([place])     skin cancer  . 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 Adenocarcinoma of right lung, stage 1 ([place]) 12/11/2014    Past Surgical History  Procedure Laterality Date  . Colon resection    . Total hip arthroplasty Right   . Appendectomy    . Laminectomy      Right L4-L5 Laminectomy  . Cystoscopy    . Rotator cuff repair Right   . Umbilical hernia repair    . Video bronchoscopy with endobronchial navigation N/A 12/18/2014    Procedure: VIDEO BRONCHOSCOPY WITH ENDOBRONCHIAL NAVIGATION ;  Surgeon: Collene Gobble, MD;  Location: [facility];  Service: Thoracic;  Laterality: N/A;    REMOTE tobacco use.  He has a glass of wine every night before bed.  No illicit drug use.  He is married.  He has five adult children.  His wife is in a memory care unit.  He is a retired Engineer, drilling.  Allergies  Allergen Reactions  . Sulfa Antibiotics Other (See Comments)    Unsure of reaction  . Sulfonamide Derivatives Other (See Comments)    Unk    Family  History  Problem Relation Age of Onset  . Lung cancer Father 63  . Stroke Mother 47  . Leukemia Brother   . Alzheimer's disease Sister   . Autoimmune disease Brother     Polyarteritis nodosa     Prior to Admission medications   Medication Sig Start Date End Date Taking? Authorizing Provider  alendronate (FOSAMAX) 70 MG tablet Take 70 mg by mouth once a week. Take with a full glass of water on an empty stomach.   Yes Historical Provider, MD  apixaban (ELIQUIS) 5 MG TABS tablet Take 5 mg by mouth 2 (two) times daily.    Yes Historical Provider, MD  CALCIUM PO Take 1,000 mg by mouth daily.   Yes Historical Provider, MD  cephALEXin (KEFLEX) 500 MG capsule Take 1 capsule (500 mg total) by mouth 2 (two) times daily.  10/14/15  Yes Wendie Agreste, MD  cetirizine (ZYRTEC) 10 MG tablet Take 10 mg by mouth every evening.    Yes Historical Provider, MD  Cholecalciferol (VITAMIN D PO) Take 3 capsules by mouth daily.   Yes Historical Provider, MD  doxycycline (VIBRA-TABS) 100 MG tablet Take 1 tablet (100 mg total) by mouth 2 (two) times daily. 10/13/15  Yes Wendie Agreste, MD  finasteride (PROSCAR) 5 MG tablet Take 5 mg by mouth daily.   Yes Historical Provider, MD  fluticasone (FLONASE) 50 MCG/ACT nasal spray Place 2 sprays into both nostrils every morning.   Yes Historical Provider, MD  Fluticasone-Salmeterol (ADVAIR) 250-50 MCG/DOSE AEPB Inhale 1 puff into the lungs 2 (two) times daily.   Yes Historical Provider, MD  furosemide (LASIX) 20 MG tablet Take 20 mg by mouth daily as needed for fluid.   Yes Historical Provider, MD  Guaifenesin 1200 MG TB12 Take 1,200 mg by mouth daily as needed (congestion).    Yes Historical Provider, MD  montelukast (SINGULAIR) 10 MG tablet Take 10 mg by mouth at bedtime.   Yes Historical Provider, MD  omeprazole (PRILOSEC) 20 MG capsule Take 20 mg by mouth daily as needed (heartburn). Reported on 07/25/2015 02/18/15  Yes Historical Provider, MD  predniSONE (DELTASONE) 10 MG tablet Take 10 mg by mouth daily. 09/23/15  Yes Historical Provider, MD  Probiotic Product (PROBIOTIC PO) Take 1 capsule by mouth daily.   Yes Historical Provider, MD  temazepam (RESTORIL) 15 MG capsule Take 15-30 mg by mouth at bedtime as needed for sleep.    Yes Historical Provider, MD  Tiotropium Bromide Monohydrate (SPIRIVA RESPIMAT) 2.5 MCG/ACT AERS Inhale 2 puffs into the lungs daily.   Yes Historical Provider, MD  verapamil (COVERA HS) 180 MG (CO) 24 hr tablet Take 1 tablet (180 mg total) by mouth at bedtime. Patient taking differently: Take 180 mg by mouth every evening.  01/16/13  Yes Evans Lance, MD  XOPENEX HFA 45 MCG/ACT inhaler 1-2 puffs every 6 (six) hours as needed for shortness of breath.  09/10/14   Yes Historical Provider, MD  zolpidem (AMBIEN) 10 MG tablet Take 20 mg by mouth at bedtime.    Yes Historical Provider, MD    Physical Exam: Filed Vitals:   10/15/15 1658  BP: 132/82  Pulse: 83  Temp: 97.5 F (36.4 C)  TempSrc: Oral  Resp: 16  Weight: 78.926 kg (174 lb)  SpO2: 98%      Constitutional: NAD, calm, comfortable, NONtoxic appearing Filed Vitals:   10/15/15 1658  BP: 132/82  Pulse: 83  Temp: 97.5 F (36.4 C)  TempSrc: Oral  Resp:  16  Weight: 78.926 kg (174 lb)  SpO2: 98%   Eyes: PERRL, lids and conjunctivae normal ENMT: Mucous membranes are moist. Posterior pharynx clear of any exudate or lesions. Normal dentition.  Neck: normal appearance, supple, no masses Respiratory: Bilateral crackles.  No wheezing.  Normal respiratory effort. No accessory muscle use.  Cardiovascular: Irregular but rate controlled.  He has a split S2 or an S3, heard best at LLSB.  No murmurs.  He has bilateral ankle edema.  His right arm is markedly swollen compared to the left.  2+ pedal pulses.   GI: abdomen is soft and compressible.  No distention.  No tenderness.  Bowel sounds are present. Musculoskeletal:  RUE is swollen but range of motion is preserved.  Good ROM, no contractures. Normal muscle tone.  Skin: RUE is erythematous with pitting edema but no distinct areas of induration or tenderness. Neurologic: No focal deficits. Psychiatric: Normal judgment and insight. Alert and oriented x 3. Normal mood.     Labs on Admission: I have personally reviewed following labs and imaging studies  CBC:  Recent Labs Lab 10/13/15 1225 10/14/15 1045 10/15/15 1922  WBC 8.9 8.1 8.5  NEUTROABS  --   --  6.9  HGB 12.3* 11.5* 11.6*  HCT 36.1* 33.6* 35.4*  MCV 94.5 94.1 97.3  PLT  --   --  098   Basic Metabolic Panel:  Recent Labs Lab 10/15/15 1922  NA 136  K 4.8  CL 99*  CO2 29  GLUCOSE 119*  BUN 18  CREATININE 1.05  CALCIUM 9.8   GFR: Estimated Creatinine Clearance: 47.7  mL/min (by C-G formula based on Cr of 1.05). Liver Function Tests:  Recent Labs Lab 10/15/15 1922  AST 23  ALT 14*  ALKPHOS 58  BILITOT 0.7  PROT 6.6  ALBUMIN 3.8   Coagulation Profile:  Recent Labs Lab 10/15/15 1922  INR 1.51*   CBG:  Recent Labs Lab 10/13/15 0913  GLUCAP 94   Sepsis Labs:  Lactic acid pending.  Radiological Exams on Admission: No results found.  EKG: Pending.  Assessment/Plan Principal Problem:   Cellulitis Active Problems:   Essential hypertension   Chronic anticoagulation   Adenocarcinoma of right lung, stage 1 (HCC)   PAF (paroxysmal atrial fibrillation) (HCC)      RUE cellulitis, no apparent evidence of sepsis at this point.  Lactic acid level pending.  "Failed" outpatient doxycycline and keflex, though he had been on both for less than 48 hours prior to being referred for admission. --Empiric IV vancomycin and rocephin for now --Sed rate and CRP --Blood cultures --Lactic acid --Defer additional imaging for now (DVT unlikely because he is anticoagulated; low index of suspicion for abscess or osteomyelitis at this point).  Reassess after inflammatory markers are reviewed and response to IV antibiotics is gauged.  Patient and daughter are in agreement with this plan. --Elevate right arm  History of atrial fibrillation --Check baseline EKG --Continue Eliquis, verapamil  Chronic respiratory failure --Continue inhalers, singulair, prednisone for now  Insomnia --Ambien qHS for sleep      DVT prophylaxis: Anticoagulated with Eliquis Code Status: FULL Family Communication: Daughter Beth at bedside at time of admission Disposition Plan: Home when improved Consults called: NONE Admission status: Observation, med surg   TIME SPENT: 55 minutes   Eber Jones MD [facility] Pager [phone]  If 7PM-7AM, please contact night-coverage [email or web address] Password [facility]  10/15/2015, 8:18 PM

## 2015-10-15 NOTE — Progress Notes (Signed)
Dr. Marily Memos notified and made aware that patient is on the unit. Dr. Quintella Baton that he will send MD over to enter admission orders.

## 2015-10-16 DIAGNOSIS — M1990 Unspecified osteoarthritis, unspecified site: Secondary | ICD-10-CM | POA: Diagnosis not present

## 2015-10-16 DIAGNOSIS — I10 Essential (primary) hypertension: Secondary | ICD-10-CM

## 2015-10-16 DIAGNOSIS — G4700 Insomnia, unspecified: Secondary | ICD-10-CM | POA: Diagnosis not present

## 2015-10-16 DIAGNOSIS — I482 Chronic atrial fibrillation: Secondary | ICD-10-CM | POA: Diagnosis not present

## 2015-10-16 DIAGNOSIS — Z7901 Long term (current) use of anticoagulants: Secondary | ICD-10-CM | POA: Diagnosis not present

## 2015-10-16 DIAGNOSIS — M545 Low back pain: Secondary | ICD-10-CM | POA: Diagnosis not present

## 2015-10-16 DIAGNOSIS — I480 Paroxysmal atrial fibrillation: Secondary | ICD-10-CM

## 2015-10-16 DIAGNOSIS — J9610 Chronic respiratory failure, unspecified whether with hypoxia or hypercapnia: Secondary | ICD-10-CM | POA: Diagnosis not present

## 2015-10-16 DIAGNOSIS — Z882 Allergy status to sulfonamides status: Secondary | ICD-10-CM | POA: Diagnosis not present

## 2015-10-16 DIAGNOSIS — C3491 Malignant neoplasm of unspecified part of right bronchus or lung: Secondary | ICD-10-CM | POA: Diagnosis not present

## 2015-10-16 DIAGNOSIS — J449 Chronic obstructive pulmonary disease, unspecified: Secondary | ICD-10-CM | POA: Diagnosis not present

## 2015-10-16 DIAGNOSIS — L03113 Cellulitis of right upper limb: Secondary | ICD-10-CM

## 2015-10-16 MED ORDER — SACCHAROMYCES BOULARDII 250 MG PO CAPS
250.0000 mg | ORAL_CAPSULE | Freq: Two times a day (BID) | ORAL | Status: DC
  Administered 2015-10-16 – 2015-10-17 (×2): 250 mg via ORAL
  Filled 2015-10-16 (×2): qty 1

## 2015-10-16 NOTE — Evaluation (Signed)
Physical Therapy Evaluation Patient Details Name: [name] MRN: [id number] DOB: July 03, 1924 Today's Date: 10/16/2015   History of Present Illness  Pt admitted with R UE cellulitis and hx of COPD, Lung CA, R THR, chronic A-fib, colon resect.  Pt recently completed outpt PT to address strength/balance deficits  Clinical Impression  Pt admitted with R UE cellulitis and presenting with functional mobility limitations 2* generalized weakness and balance deficits.  Pt plans dc home alone with PRN assist of family/friends.  Will review possible PT follow up at next session.    Follow Up Recommendations  (TBD)    Equipment Recommendations  None recommended by PT    Recommendations for Other Services OT consult     Precautions / Restrictions Precautions Precautions: Fall Restrictions Weight Bearing Restrictions: No      Mobility  Bed Mobility Overal bed mobility: Modified Independent             General bed mobility comments: Pt sit to supine unassisted  Transfers Overall transfer level: Needs assistance Equipment used: None Transfers: Sit to/from Stand Sit to Stand: Min guard         General transfer comment: min cues for safe transition position.  Min guard to rise and steady   Ambulation/Gait Ambulation/Gait assistance: Min guard Ambulation Distance (Feet): 900 Feet Assistive device: Rolling walker (2 wheeled) Gait Pattern/deviations: Step-through pattern;Shuffle;Trunk flexed     General Gait Details: cues for posture and position from RW; Pt shaky and unsteady sans AD but with marked improvement in stability with use of RW  Stairs            Wheelchair Mobility    Modified Rankin (Stroke Patients Only)       Balance Overall balance assessment: Needs assistance Sitting-balance support: No upper extremity supported;Feet supported Sitting balance-Leahy Scale: Good     Standing balance support: No upper extremity supported Standing  balance-Leahy Scale: Fair                               Pertinent Vitals/Pain Pain Assessment: No/denies pain    Home Living Family/patient expects to be discharged to:: Private residence Living Arrangements: Alone Available Help at Discharge: Available 24 hours/day Type of Home: House Home Access: Stairs to enter Entrance Stairs-Rails: Psychiatric nurse of Steps: 2 Home Layout: One level;Two level;Able to live on main level with bedroom/bathroom Home Equipment: Gilford Rile - 2 wheels      Prior Function Level of Independence: Independent with assistive device(s)         Comments: Used cane or RW     Hand Dominance        Extremity/Trunk Assessment   Upper Extremity Assessment: Generalized weakness;RUE deficits/detail RUE Deficits / Details: reddened and edematous         Lower Extremity Assessment: Generalized weakness      Cervical / Trunk Assessment: Kyphotic  Communication   Communication: No difficulties  Cognition Arousal/Alertness: Awake/alert Behavior During Therapy: WFL for tasks assessed/performed Overall Cognitive Status: Within Functional Limits for tasks assessed                      General Comments      Exercises        Assessment/Plan    PT Assessment Patient needs continued PT services  PT Diagnosis Difficulty walking   PT Problem List Decreased strength;Decreased activity tolerance;Decreased balance;Decreased mobility;Decreased knowledge of use of DME;Obesity  PT  Treatment Interventions DME instruction;Gait training;Stair training;Functional mobility training;Therapeutic activities;Therapeutic exercise;Balance training;Patient/family education   PT Goals (Current goals can be found in the Care Plan section) Acute Rehab PT Goals Patient Stated Goal: Regain IND PT Goal Formulation: With patient Time For Goal Achievement: 10/28/15 Potential to Achieve Goals: Good    Frequency Min 3X/week    Barriers to discharge        Co-evaluation               End of Session Equipment Utilized During Treatment: Gait belt Activity Tolerance: Patient tolerated treatment well Patient left: in bed;with call bell/phone within reach;with family/visitor present Nurse Communication: Mobility status    Functional Assessment Tool Used: Clinical judgement Functional Limitation: Mobility: Walking and moving around Mobility: Walking and Moving Around Current Status (G8115): At least 20 percent but less than 40 percent impaired, limited or restricted Mobility: Walking and Moving Around Goal Status [phone]): At least 1 percent but less than 20 percent impaired, limited or restricted    Time: 1450-1516 PT Time Calculation (min) (ACUTE ONLY): 26 min   Charges:   PT Evaluation $PT Eval Low Complexity: 1 Procedure PT Treatments $Gait Training: 8-22 mins   PT G Codes:   PT G-Codes **NOT FOR INPATIENT CLASS** Functional Assessment Tool Used: Clinical judgement Functional Limitation: Mobility: Walking and moving around Mobility: Walking and Moving Around Current Status (B5597): At least 20 percent but less than 40 percent impaired, limited or restricted Mobility: Walking and Moving Around Goal Status [phone]): At least 1 percent but less than 20 percent impaired, limited or restricted    Turner Baillie 10/16/2015, 5:11 PM

## 2015-10-16 NOTE — Progress Notes (Signed)
PROGRESS NOTE    [name]  OVF:[id number]  DOB: [birth year]  DOA: 10/15/2015 PCP: [name], MD Outpatient Specialists:   Hospital course: [name] is a 81 y.o. gentleman with a history of chronic atrial fibrillation (anticoagulated with Eliquis, CHADS-Vasc score of three for HTN, age), lung cancer, chronic respiratory failure, HTN, and chronic low back pain who presents for direct admission after failing outpatient therapy for a RUE cellulitis. The patient is accompanied by his daughter [name], who is his healthcare POA. He still lives independently and reports an accidental fall at home (slipped on a piece of newspaper on a wood floor) one week ago. He denies loss of consciousness, but he hit his arm, presumably on a chair, while falling to the ground. He developed two abrasions that eventually scabbed over. However, by Monday, he presented to an urgent care center for evaluation of progressive swelling and erythema in his right upper extremity. RUE xrays at that time suggested olecranon bursitis or hematoma, but there was no evidence of acute fracture or dislocation. He was given a prescription for doxycycline. He followed up the next day and reported low grade fever to 99.3. He was given a prescription for keflex, to be taken in addition to the doxycycline. He was seen by his PCP today, who referred him for direct admission for IV antibiotics.   Assessment & Plan:  RUE cellulitis, Failed outpatient doxycycline and keflex.  --Empiric IV vancomycin and rocephin for now --Sed rate and CRP --Blood cultures NGTD --Lactic acid 1.2  History of atrial fibrillation --Check baseline EKG --Continue Eliquis, verapamil  Chronic respiratory failure --Continue inhalers, singulair, prednisone for now  Insomnia --Ambien qHS for sleep  DVT prophylaxis: Anticoagulated with Eliquis Code Status: FULL Family Communication: Daughter [name] at bedside at time of  admission Disposition Plan: Home when improved 1-2 days Consults called: NONE Admission status: Observation, med surg  Antimicrobials: Anti-infectives    Start     Dose/Rate Route Frequency Ordered Stop   10/16/15 2100  vancomycin (VANCOCIN) 1,250 mg in sodium chloride 0.9 % 250 mL IVPB     1,250 mg 166.7 mL/hr over 90 Minutes Intravenous Every 24 hours 10/15/15 2019     10/15/15 2100  cefTRIAXone (ROCEPHIN) 1 g in dextrose 5 % 50 mL IVPB     1 g 100 mL/hr over 30 Minutes Intravenous Every 24 hours 10/15/15 2019     10/15/15 1900  piperacillin-tazobactam (ZOSYN) IVPB 3.375 g  Status:  Discontinued     3.375 g 100 mL/hr over 30 Minutes Intravenous  Once 10/15/15 1856 10/15/15 1958   10/15/15 1900  vancomycin (VANCOCIN) IVPB 1000 mg/200 mL premix     1,000 mg 200 mL/hr over 60 Minutes Intravenous  Once 10/15/15 1856 10/15/15 2218          Subjective: Pt reporting that much less swelling and redness today.   Objective: Filed Vitals:   10/15/15 2221 10/16/15 0456 10/16/15 0823 10/16/15 0827  BP: 112/67 103/64    Pulse: 87 66    Temp: 98 F (36.7 C) 98.1 F (36.7 C)    TempSrc: Oral Oral    Resp: 16 16    Height: '5\' 9"'$  (1.753 m)     Weight:      SpO2: 99% 96% 93% 93%    Intake/Output Summary (Last 24 hours) at 10/16/15 1303 Last data filed at 10/16/15 1000  Gross per 24 hour  Intake    240 ml  Output    700  ml  Net   -460 ml   Filed Weights   10/15/15 1658  Weight: 78.926 kg (174 lb)    Exam:  General exam: awake, alert, no distress.  Eyes: PERRL, lids and conjunctivae normal ENMT: Mucous membranes are moist. Posterior pharynx clear of any exudate or lesions. Normal dentition.  Neck: normal appearance, supple, no masses Respiratory: Bilateral crackles. No wheezing. Normal respiratory effort. No accessory muscle use.  Cardiovascular: Irregular but rate controlled. He has a split S2 or an S3, heard best at LLSB. No murmurs. He has bilateral ankle edema.  His right arm is markedly swollen compared to the left. 2+ pedal pulses.  GI: abdomen is soft and compressible. No distention. No tenderness. Bowel sounds are present. Musculoskeletal: RUE is swollen but range of motion is preserved. Good ROM, no contractures. Normal muscle tone.  Skin: RUE is erythematous with pitting edema but no distinct areas of induration or tenderness. Neurologic: No focal deficits. Psychiatric: Normal judgment and insight. Alert and oriented x 3. Normal mood. .  Data Reviewed: Basic Metabolic Panel:  Recent Labs Lab 10/15/15 1922  NA 136  K 4.8  CL 99*  CO2 29  GLUCOSE 119*  BUN 18  CREATININE 1.05  CALCIUM 9.8   Liver Function Tests:  Recent Labs Lab 10/15/15 1922  AST 23  ALT 14*  ALKPHOS 58  BILITOT 0.7  PROT 6.6  ALBUMIN 3.8   No results for input(s): LIPASE, AMYLASE in the last 168 hours. No results for input(s): AMMONIA in the last 168 hours. CBC:  Recent Labs Lab 10/13/15 1225 10/14/15 1045 10/15/15 1922  WBC 8.9 8.1 8.5  NEUTROABS  --   --  6.9  HGB 12.3* 11.5* 11.6*  HCT 36.1* 33.6* 35.4*  MCV 94.5 94.1 97.3  PLT  --   --  195   Cardiac Enzymes: No results for input(s): CKTOTAL, CKMB, CKMBINDEX, TROPONINI in the last 168 hours. BNP (last 3 results) No results for input(s): PROBNP in the last 8760 hours. CBG:  Recent Labs Lab 10/13/15 0913  GLUCAP 94    Recent Results (from the past 240 hour(s))  Culture, blood (routine x 2)     Status: None (Preliminary result)   Collection Time: 10/15/15  7:30 PM  Result Value Ref Range Status   Specimen Description BLOOD LEFT ARM Performed at [facility]   Final   Special Requests BOTTLES DRAWN AEROBIC AND ANAEROBIC 5CC  Final   Culture PENDING  Incomplete   Report Status PENDING  Incomplete     Studies: No results found.   Scheduled Meds: . apixaban  5 mg Oral BID  . cefTRIAXone (ROCEPHIN)  IV  1 g Intravenous Q24H  . finasteride  5 mg Oral Daily  .  fluticasone  2 spray Each Nare q morning - 10a  . mometasone-formoterol  2 puff Inhalation BID  . montelukast  10 mg Oral QHS  . pantoprazole  40 mg Oral Daily  . predniSONE  10 mg Oral Q breakfast  . tiotropium  1 capsule Inhalation Daily  . vancomycin  1,250 mg Intravenous Q24H  . verapamil  180 mg Oral QPM  . zolpidem  5 mg Oral QHS   Continuous Infusions:   Principal Problem:   Cellulitis Active Problems:   Essential hypertension   Chronic anticoagulation   Adenocarcinoma of right lung, stage 1 (HCC)   PAF (paroxysmal atrial fibrillation) ([place])   Time spent:    Irwin Brakeman, MD, FAAFP [facility] Pager  [phone]  If 7PM-7AM, please contact night-coverage [email or web address] Password [email or web address] 10/16/2015, 1:03 PM    LOS: 1 day

## 2015-10-17 ENCOUNTER — Ambulatory Visit: Payer: Self-pay | Admitting: Pulmonary Disease

## 2015-10-17 DIAGNOSIS — L03113 Cellulitis of right upper limb: Secondary | ICD-10-CM | POA: Diagnosis not present

## 2015-10-17 DIAGNOSIS — J9610 Chronic respiratory failure, unspecified whether with hypoxia or hypercapnia: Secondary | ICD-10-CM | POA: Diagnosis not present

## 2015-10-17 DIAGNOSIS — Z7901 Long term (current) use of anticoagulants: Secondary | ICD-10-CM | POA: Diagnosis not present

## 2015-10-17 DIAGNOSIS — I10 Essential (primary) hypertension: Secondary | ICD-10-CM | POA: Diagnosis not present

## 2015-10-17 DIAGNOSIS — I480 Paroxysmal atrial fibrillation: Secondary | ICD-10-CM | POA: Diagnosis not present

## 2015-10-17 DIAGNOSIS — C3491 Malignant neoplasm of unspecified part of right bronchus or lung: Secondary | ICD-10-CM | POA: Diagnosis not present

## 2015-10-17 DIAGNOSIS — G4700 Insomnia, unspecified: Secondary | ICD-10-CM | POA: Diagnosis not present

## 2015-10-17 DIAGNOSIS — J449 Chronic obstructive pulmonary disease, unspecified: Secondary | ICD-10-CM | POA: Diagnosis not present

## 2015-10-17 MED ORDER — SACCHAROMYCES BOULARDII 250 MG PO CAPS: 250.0000 mg | ORAL_CAPSULE | Freq: Two times a day (BID) | ORAL | Status: DC

## 2015-10-17 NOTE — Care Management Note (Signed)
Case Management Note  Patient Details  Name: [name] MRN: [id number] Date of Birth: 1925/01/31  Subjective/Objective:   82 yo admitted with Cellulitis                 Action/Plan: From home alone. Per PT eval pt has no HHPT needs. No other CM needs identified or communicated.  Expected Discharge Date:   (unknown)               Expected Discharge Plan:  Home/Self Care  In-House Referral:     Discharge planning Services  CM Consult  Post Acute Care Choice:    Choice offered to:     DME Arranged:    DME Agency:     HH Arranged:    [place] Agency:     Status of Service:  Completed, signed off  If discussed at [employer] of Stay Meetings, dates discussed:    Additional CommentsLynnell Catalan, RN 10/17/2015, 9:30 AM  [phone]

## 2015-10-17 NOTE — Progress Notes (Signed)
Pharmacy Antibiotic Note  [name] is a 82 y.o. male admitted on 10/15/2015 with nonpurulent cellulitis.  Seen at [facility] 7/17 and given doxycycline.  Returned to [facility] the 7/18 with no improvement and Keflex added. Now presenting with same. Pharmacy has been consulted for vancomycin and Rocephin dosing.  Plan:  Continue Vancomycin 1250 mg IV q24 hr; goal trough 10-15 mcg/mL for cellulitis w/o abscess  Measure vancomycin trough levels at steady state as indicated  Spoke with MD who agreed picture was not overly concerning for MRSA; consider narrowing quickly if prompt improvement  Rocephin 1g IV q24 hr  F/u renal fxn, cultures, clinical course  BMET in AM   Height: '5\' 9"'$  (175.3 cm) Weight: 174 lb (78.926 kg) IBW/kg (Calculated) : 70.7  Temp (24hrs), Avg:98.4 F (36.9 C), Min:98 F (36.7 C), Max:98.7 F (37.1 C)   Recent Labs Lab 10/13/15 1225 10/14/15 1045 10/15/15 1922  WBC 8.9 8.1 8.5  CREATININE  --   --  1.05  LATICACIDVEN  --   --  1.2    Estimated Creatinine Clearance: 47.7 mL/min (by C-G formula based on Cr of 1.05).    Allergies  Allergen Reactions  . Sulfa Antibiotics Other (See Comments)    Unsure of reaction  . Sulfonamide Derivatives Other (See Comments)    Unk    Antimicrobials this admission: See PTA abx above Zosyn x 1  Vanc 7/19 >>  Rocephin 7/19 >>   Dose adjustments this admission: ---  Microbiology results: 7/19 Vanc >>  7/19 Rocephin >>   Thank you for allowing pharmacy to be a part of this patient's care.  Ralene Bathe, PharmD, BCPS 10/17/2015, 10:13 AM  Pager: [phone]

## 2015-10-17 NOTE — Progress Notes (Signed)
Physical Therapy Treatment Patient Details Name: [name] MRN: [id number] DOB: 07-14-1924 Today's Date: 10-Nov-2015    History of Present Illness Pt admitted with R UE cellulitis and hx of COPD, Lung CA, R THR, chronic A-fib, colon resect.  Pt recently completed outpt PT to address strength/balance deficits    PT Comments    Noted improvement in pt stability with ambulation this date.  Pt plans dc home with family assist and will resume home program recently established by OP PT.  Follow Up Recommendations  No PT follow up (Pt will resume home therex program from OP PT)     Equipment Recommendations  None recommended by PT    Recommendations for Other Services OT consult     Precautions / Restrictions Precautions Precautions: Fall Restrictions Weight Bearing Restrictions: No    Mobility  Bed Mobility Overal bed mobility: Modified Independent             General bed mobility comments: Pt sit <> supine unassisted  Transfers Overall transfer level: Needs assistance Equipment used: Rolling walker (2 wheeled) Transfers: Sit to/from Stand Sit to Stand: Supervision         General transfer comment: min cues for safe transition position.  Marked improvement in stability vs last session  Ambulation/Gait Ambulation/Gait assistance: Supervision Ambulation Distance (Feet): 450 Feet Assistive device: Rolling walker (2 wheeled) Gait Pattern/deviations: Step-through pattern;Trunk flexed     General Gait Details: min cues for posture and position from RW esp in turns   Stairs            Wheelchair Mobility    Modified Rankin (Stroke Patients Only)       Balance Overall balance assessment: Needs assistance Sitting-balance support: Feet supported Sitting balance-Leahy Scale: Normal     Standing balance support: No upper extremity supported Standing balance-Leahy Scale: Fair                      Cognition Arousal/Alertness:  Awake/alert Behavior During Therapy: WFL for tasks assessed/performed Overall Cognitive Status: Within Functional Limits for tasks assessed                      Exercises      General Comments        Pertinent Vitals/Pain Pain Assessment: No/denies pain    Home Living                      Prior Function            PT Goals (current goals can now be found in the care plan section) Acute Rehab PT Goals Patient Stated Goal: Regain IND PT Goal Formulation: With patient Time For Goal Achievement: 10/28/15 Potential to Achieve Goals: Good Progress towards PT goals: Progressing toward goals    Frequency  Min 3X/week    PT Plan Current plan remains appropriate    Co-evaluation             End of Session Equipment Utilized During Treatment: Gait belt Activity Tolerance: Patient tolerated treatment well Patient left: in bed;with call bell/phone within reach     Time: 0916-0928 PT Time Calculation (min) (ACUTE ONLY): 12 min  Charges:  $Gait Training: 8-22 mins                    G Codes:      Devyon Keator 2015/11/10, 12:12 PM

## 2015-10-17 NOTE — Discharge Summary (Signed)
Physician Discharge Summary  [name] MEQ:[id number] DOB: 06-13-1924 DOA: 10/15/2015  PCP: [name], MD  Admit date: 10/15/2015 Discharge date: 10/17/2015  Recommendations for Outpatient Follow-up:  1. Follow up with PCP in 1 weeks and follow up final blood cultures with PCP  Discharge Condition: Stable  Brief/Interim Summary: [name] is a 82 y.o. gentleman with a history of chronic atrial fibrillation (anticoagulated with Eliquis, CHADS-Vasc score of three for HTN, age), lung cancer, chronic respiratory failure, HTN, and chronic low back pain who presents for direct admission after failing outpatient therapy for a RUE cellulitis. The patient is accompanied by his daughter Eustaquio Maize, who is his healthcare POA. He still lives independently and reports an accidental fall at home (slipped on a piece of newspaper on a wood floor) one week ago. He denies loss of consciousness, but he hit his arm, presumably on a chair, while falling to the ground. He developed two abrasions that eventually scabbed over. However, by Monday, he presented to an urgent care center for evaluation of progressive swelling and erythema in his right upper extremity. RUE xrays at that time suggested olecranon bursitis or hematoma, but there was no evidence of acute fracture or dislocation. He was given a prescription for doxycycline. He followed up the next day and reported low grade fever to 99.3. He was given a prescription for keflex, to be taken in addition to the doxycycline. He was seen by his PCP  who referred him for direct admission for IV antibiotics.   Assessment & Plan: RUE cellulitis - much improved.   --Treated Empiric IV vancomycin and rocephin with rapid response and improvement --Blood cultures NGTD --Lactic acid 1.2 -- Home to resume doxy and cephalexin + probiotics   -- close follow up with PCP.   History of atrial fibrillation - remained stable --Check baseline EKG --Continue  Eliquis, verapamil  Chronic respiratory failure - remained stable --Continue inhalers, singulair, prednisone  Insomnia --Ambien qHS for sleep  DVT prophylaxis: Anticoagulated with Eliquis Code Status: FULL Family Communication: Daughter Beth at bedside at time of admission Disposition Plan: Home  Discharge Diagnoses:  Principal Problem:   Cellulitis Active Problems:   Essential hypertension   Chronic anticoagulation   Adenocarcinoma of right lung, stage 1 (HCC)   PAF (paroxysmal atrial fibrillation) (HCC)  Discharge Instructions  Discharge Instructions    Diet - low sodium heart healthy    Complete by:  As directed      Increase activity slowly    Complete by:  As directed             Medication List    TAKE these medications        alendronate 70 MG tablet  Commonly known as:  FOSAMAX  Take 70 mg by mouth once a week. Take with a full glass of water on an empty stomach.     AMBIEN 10 MG tablet  Generic drug:  zolpidem  Take 20 mg by mouth at bedtime.     CALCIUM PO  Take 1,000 mg by mouth daily.     cephALEXin 500 MG capsule  Commonly known as:  KEFLEX  Take 1 capsule (500 mg total) by mouth 2 (two) times daily.     cetirizine 10 MG tablet  Commonly known as:  ZYRTEC  Take 10 mg by mouth every evening.     doxycycline 100 MG tablet  Commonly known as:  VIBRA-TABS  Take 1 tablet (100 mg total) by mouth 2 (two) times  daily.     ELIQUIS 5 MG Tabs tablet  Generic drug:  apixaban  Take 5 mg by mouth 2 (two) times daily.     finasteride 5 MG tablet  Commonly known as:  PROSCAR  Take 5 mg by mouth daily.     fluticasone 50 MCG/ACT nasal spray  Commonly known as:  FLONASE  Place 2 sprays into both nostrils every morning.     Fluticasone-Salmeterol 250-50 MCG/DOSE Aepb  Commonly known as:  ADVAIR  Inhale 1 puff into the lungs 2 (two) times daily.     furosemide 20 MG tablet  Commonly known as:  LASIX  Take 20 mg by mouth daily as needed for fluid.      Guaifenesin 1200 MG Tb12  Take 1,200 mg by mouth daily as needed (congestion).     montelukast 10 MG tablet  Commonly known as:  SINGULAIR  Take 10 mg by mouth at bedtime.     omeprazole 20 MG capsule  Commonly known as:  PRILOSEC  Take 20 mg by mouth daily as needed (heartburn). Reported on 07/25/2015     predniSONE 10 MG tablet  Commonly known as:  DELTASONE  Take 10 mg by mouth daily.     PROBIOTIC PO  Take 1 capsule by mouth daily.     saccharomyces boulardii 250 MG capsule  Commonly known as:  FLORASTOR  Take 1 capsule (250 mg total) by mouth 2 (two) times daily.     SPIRIVA RESPIMAT 2.5 MCG/ACT Aers  Generic drug:  Tiotropium Bromide Monohydrate  Inhale 2 puffs into the lungs daily.     temazepam 15 MG capsule  Commonly known as:  RESTORIL  Take 15-30 mg by mouth at bedtime as needed for sleep.     verapamil 180 MG (CO) 24 hr tablet  Commonly known as:  COVERA HS  Take 1 tablet (180 mg total) by mouth at bedtime.     VITAMIN D PO  Take 3 capsules by mouth daily.     XOPENEX HFA 45 MCG/ACT inhaler  Generic drug:  levalbuterol  1-2 puffs every 6 (six) hours as needed for shortness of breath.           Follow-up Information    Follow up with [name], MD. Schedule an appointment as soon as possible for a visit in 5 days.   Specialty:  Internal Medicine   Why:  Hospital Follow Up   Contact information:   [address] [place] [address] [phone]      Allergies  Allergen Reactions  . Sulfa Antibiotics Other (See Comments)    Unsure of reaction  . Sulfonamide Derivatives Other (See Comments)    Unk   Procedures/Studies: Dg Elbow Complete Right (3+view)  10/13/2015  CLINICAL DATA:  Status post fall onto the elbow 5 days ago. Persistent olecranon region pain with swelling and redness EXAM: RIGHT ELBOW - COMPLETE 3+ VIEW COMPARISON:  None in PACs FINDINGS: There is a large amount of soft tissue swelling over the extensor aspect of the  elbow. There is a tiny olecranon spur. No olecranon fracture is observed. The radial head is intact. The condylar and supracondylar regions of the distal humerus appear normal. IMPRESSION: Olecranon spurring with soft tissue swelling consistent with olecranon bursitis or hematoma. No acute elbow fracture nor dislocation is observed. Electronically Signed   By: David  Martinique M.D.   On: 10/13/2015 12:18   Nm Pet Image Restag (ps) Skull Base To Thigh  10/13/2015  CLINICAL DATA:  Subsequent Treatment strategy for Left lung cancer. Status post cryotherapy. EXAM: NUCLEAR MEDICINE PET SKULL BASE TO THIGH TECHNIQUE: 8.4 mCi F-18 FDG was injected intravenously. Full-ring PET imaging was performed from the skull base to thigh after the radiotracer. CT data was obtained and used for attenuation correction and anatomic localization. FASTING BLOOD GLUCOSE:  Value: 94 mg/dl COMPARISON:  Chest CT 07/14/2015.  PET 04/15/2015. FINDINGS: NECK No areas of abnormal hypermetabolism. CHEST No thoracic nodal hypermetabolism. The area of consolidation within the superior segment left lower lobe the measures on the order of 3.7 x 3.2 cm on image 35/series 8. Decreased from 4.1 x 3.9 cm at the same level on the prior exam (when remeasured). This demonstrates a primarily low-level hypermetabolism with somewhat more focal hypermetabolism in its inferior most aspect. This measures a S.U.V. max of 5.4 today versus a S.U.V. max of 3.1 on the prior exam. No well-defined inferior nodule or mass to correspond to the hypermetabolism. ABDOMEN/PELVIS No areas of abnormal hypermetabolism. SKELETON No abnormal marrow activity. CT IMAGES PERFORMED FOR ATTENUATION CORRECTION Cerebral and cerebellar atrophy which are age appropriate. Bilateral carotid atherosclerosis. No cervical adenopathy. Cardiomegaly. Multivessel coronary artery atherosclerosis. Aortic and branch vessel atherosclerosis. Pulmonary artery enlargement, including a 3.2 cm outflow tract.  Small left pleural effusion is decreased since the prior PET. A small right pleural effusion is minimally increased since that exam. Right upper lobe cylindrical bronchiectasis persists and is likely post infectious or inflammatory. Right lower lobe 4 mm pulmonary nodule is unchanged, including on image 46/series 8. Right renal cyst. Old granulomatous disease in the liver and spleen. Normal adrenal glands. Abdominal aortic and branch vessel atherosclerosis. Left common iliac artery aneurysm and right common iliac artery ectasia are unchanged. Example left common iliac at 2.0 cm on image 147/series 5. Degraded evaluation of the pelvis, secondary to beam hardening artifact from right hip arthroplasty. IMPRESSION: 1. Increased hypermetabolism about the inferior aspect of the superior segment left lower lobe treatment site. No well-defined mass in this area. This warrants followup attention to exclude residual or recurrent disease. 2. No thoracic nodal or extrathoracic hypermetabolism to suggest metastatic disease. 3. Decreased small left and slightly increased small right pleural effusions. 4. Coronary artery atherosclerosis. Aortic atherosclerosis. Pulmonary artery enlargement suggests pulmonary arterial hypertension. Electronically Signed   By: Abigail Miyamoto M.D.   On: 10/13/2015 11:22    Subjective: Pt reports that he feels much better, wants to go home.   Discharge Exam: Filed Vitals:   10/16/15 2027 10/17/15 0623  BP: 114/62 107/66  Pulse: 77 59  Temp: 98.7 F (37.1 C) 98 F (36.7 C)  Resp: 16 16   Filed Vitals:   10/16/15 2027 10/16/15 2029 10/17/15 0623 10/17/15 1020  BP: 114/62  107/66   Pulse: 77  59   Temp: 98.7 F (37.1 C)  98 F (36.7 C)   TempSrc: Oral  Oral   Resp: 16  16   Height:      Weight:      SpO2: 95% 95% 97% 97%    General: Pt is alert, awake, not in acute distress Cardiovascular: RRR, S1/S2 +, no rubs, no gallops Respiratory: CTA bilaterally, no wheezing, no  rhonchi Abdominal: Soft, NT, ND, bowel sounds + Extremities: improvement noted in RUE, less edema, erythema improving, Good ROM and neurovascularly intact. Psychiatric: Normal judgment and insight. Alert and oriented x 3. Normal mood.    The results of significant diagnostics from this hospitalization (including imaging, microbiology, ancillary  and laboratory) are listed below for reference.     Microbiology: Recent Results (from the past 240 hour(s))  Culture, blood (routine x 2)     Status: None (Preliminary result)   Collection Time: 10/15/15  7:30 PM  Result Value Ref Range Status   Specimen Description BLOOD LEFT ARM  Final   Special Requests BOTTLES DRAWN AEROBIC AND ANAEROBIC 5CC  Final   Culture   Final    NO GROWTH < 24 HOURS Performed at [facility]    Report Status PENDING  Incomplete  Culture, blood (routine x 2)     Status: None (Preliminary result)   Collection Time: 10/15/15  7:30 PM  Result Value Ref Range Status   Specimen Description BLOOD LEFT HAND  Final   Special Requests BOTTLES DRAWN AEROBIC AND ANAEROBIC 10CC  Final   Culture   Final    NO GROWTH < 24 HOURS Performed at [facility]    Report Status PENDING  Incomplete     Labs: BNP (last 3 results)  Recent Labs  01/07/15 0215  BNP 578.4*   Basic Metabolic Panel:  Recent Labs Lab 10/15/15 1922  NA 136  K 4.8  CL 99*  CO2 29  GLUCOSE 119*  BUN 18  CREATININE 1.05  CALCIUM 9.8   Liver Function Tests:  Recent Labs Lab 10/15/15 1922  AST 23  ALT 14*  ALKPHOS 58  BILITOT 0.7  PROT 6.6  ALBUMIN 3.8   No results for input(s): LIPASE, AMYLASE in the last 168 hours. No results for input(s): AMMONIA in the last 168 hours. CBC:  Recent Labs Lab 10/13/15 1225 10/14/15 1045 10/15/15 1922  WBC 8.9 8.1 8.5  NEUTROABS  --   --  6.9  HGB 12.3* 11.5* 11.6*  HCT 36.1* 33.6* 35.4*  MCV 94.5 94.1 97.3  PLT  --   --  195   Cardiac Enzymes: No results for input(s):  CKTOTAL, CKMB, CKMBINDEX, TROPONINI in the last 168 hours. BNP: Invalid input(s): POCBNP CBG:  Recent Labs Lab 10/13/15 0913  GLUCAP 94   D-Dimer No results for input(s): DDIMER in the last 72 hours. Hgb A1c No results for input(s): HGBA1C in the last 72 hours. Lipid Profile No results for input(s): CHOL, HDL, LDLCALC, TRIG, CHOLHDL, LDLDIRECT in the last 72 hours. Thyroid function studies No results for input(s): TSH, T4TOTAL, T3FREE, THYROIDAB in the last 72 hours.  Invalid input(s): FREET3 Anemia work up No results for input(s): VITAMINB12, FOLATE, FERRITIN, TIBC, IRON, RETICCTPCT in the last 72 hours. Urinalysis    Component Value Date/Time   COLORURINE YELLOW 07/20/2007 0227   APPEARANCEUR CLEAR 07/20/2007 0227   LABSPEC 1.020 08/25/2009 1739   PHURINE 5.5 08/25/2009 1739   GLUCOSEU NEGATIVE 08/25/2009 1739   HGBUR NEGATIVE 08/25/2009 1739   BILIRUBINUR NEGATIVE 08/25/2009 1739   KETONESUR NEGATIVE 08/25/2009 1739   PROTEINUR NEGATIVE 08/25/2009 1739   UROBILINOGEN 0.2 08/25/2009 1739   NITRITE NEGATIVE 08/25/2009 1739   LEUKOCYTESUR  08/25/2009 1739    NEGATIVE Biochemical Testing Only. Please order routine urinalysis from main lab if confirmatory testing is needed.   Sepsis Labs Invalid input(s): PROCALCITONIN,  WBC,  LACTICIDVEN Microbiology Recent Results (from the past 240 hour(s))  Culture, blood (routine x 2)     Status: None (Preliminary result)   Collection Time: 10/15/15  7:30 PM  Result Value Ref Range Status   Specimen Description BLOOD LEFT ARM  Final   Special Requests BOTTLES DRAWN AEROBIC AND ANAEROBIC  5CC  Final   Culture   Final    NO GROWTH < 24 HOURS Performed at [facility]    Report Status PENDING  Incomplete  Culture, blood (routine x 2)     Status: None (Preliminary result)   Collection Time: 10/15/15  7:30 PM  Result Value Ref Range Status   Specimen Description BLOOD LEFT HAND  Final   Special Requests BOTTLES DRAWN  AEROBIC AND ANAEROBIC 10CC  Final   Culture   Final    NO GROWTH < 24 HOURS Performed at [facility]    Report Status PENDING  Incomplete   Time coordinating discharge: 31 minutes  SIGNED:  Irwin Brakeman, MD  [facility] 10/17/2015, 11:22 AM Pager   If 7PM-7AM, please contact night-coverage [email or web address] Password [email or web address]

## 2015-10-17 NOTE — Progress Notes (Signed)
Pt discharged home with Joelene Millin, assistant in stable condition.  Discharge instructions given. Script sent to pt pharmacy of choice.  Pt verbalized understanding.  No immediate questions or concerns.

## 2015-10-17 NOTE — Discharge Instructions (Signed)
Cellulitis °Cellulitis is an infection of the skin and the tissue under the skin. The infected area is usually red and tender. This happens most often in the arms and lower legs. °HOME CARE  °· Take your antibiotic medicine as told. Finish the medicine even if you start to feel better. °· Keep the infected arm or leg raised (elevated). °· Put a warm cloth on the area up to 4 times per day. °· Only take medicines as told by your doctor. °· Keep all doctor visits as told. °GET HELP IF: °· You see red streaks on the skin coming from the infected area. °· Your red area gets bigger or turns a dark color. °· Your bone or joint under the infected area is painful after the skin heals. °· Your infection comes back in the same area or different area. °· You have a puffy (swollen) bump in the infected area. °· You have new symptoms. °· You have a fever. °GET HELP RIGHT AWAY IF:  °· You feel very sleepy. °· You throw up (vomit) or have watery poop (diarrhea). °· You feel sick and have muscle aches and pains. °  °This information is not intended to replace advice given to you by your health care provider. Make sure you discuss any questions you have with your health care provider. °  °Document Released: 09/01/2007 Document Revised: 12/04/2014 Document Reviewed: 05/31/2011 °Elsevier Interactive Patient Education ©2016 [employer] ° °

## 2015-10-20 LAB — CULTURE, BLOOD (ROUTINE X 2)
CULTURE: NO GROWTH
Culture: NO GROWTH

## 2015-10-21 DIAGNOSIS — J301 Allergic rhinitis due to pollen: Secondary | ICD-10-CM | POA: Diagnosis not present

## 2015-10-21 DIAGNOSIS — J3089 Other allergic rhinitis: Secondary | ICD-10-CM | POA: Diagnosis not present

## 2015-10-30 ENCOUNTER — Telehealth: Payer: Self-pay | Admitting: Medical Oncology

## 2015-10-30 NOTE — Telephone Encounter (Signed)
-----   Message from Curt Bears, MD sent at 10/28/2015  9:58 PM EDT ----- Regarding: RE: pt requests appt sooner Only if someone cancel. ----- Message ----- From: Ardeen Garland, RN Sent: 10/28/2015   8:38 AM To: Curt Bears, MD Subject: pt requests appt sooner                        Has appt 8/9-can you see him sooner? I did not see anything open. PET done 7/17

## 2015-10-30 NOTE — Telephone Encounter (Signed)
I left a message with pt regarding note below. No cancels at this time.

## 2015-11-04 DIAGNOSIS — J3089 Other allergic rhinitis: Secondary | ICD-10-CM | POA: Diagnosis not present

## 2015-11-04 DIAGNOSIS — J301 Allergic rhinitis due to pollen: Secondary | ICD-10-CM | POA: Diagnosis not present

## 2015-11-05 ENCOUNTER — Ambulatory Visit (HOSPITAL_BASED_OUTPATIENT_CLINIC_OR_DEPARTMENT_OTHER): Payer: Medicare Other | Admitting: Internal Medicine

## 2015-11-05 ENCOUNTER — Encounter: Payer: Self-pay | Admitting: Internal Medicine

## 2015-11-05 ENCOUNTER — Other Ambulatory Visit (HOSPITAL_BASED_OUTPATIENT_CLINIC_OR_DEPARTMENT_OTHER): Payer: Medicare Other

## 2015-11-05 VITALS — BP 133/76 | HR 83 | Temp 98.0°F | Resp 17 | Ht 69.0 in | Wt 171.7 lb

## 2015-11-05 DIAGNOSIS — C3491 Malignant neoplasm of unspecified part of right bronchus or lung: Secondary | ICD-10-CM

## 2015-11-05 DIAGNOSIS — Z85118 Personal history of other malignant neoplasm of bronchus and lung: Secondary | ICD-10-CM

## 2015-11-05 DIAGNOSIS — D649 Anemia, unspecified: Secondary | ICD-10-CM

## 2015-11-05 LAB — COMPREHENSIVE METABOLIC PANEL
ALT: 14 U/L (ref 0–55)
ANION GAP: 8 meq/L (ref 3–11)
AST: 24 U/L (ref 5–34)
Albumin: 3.5 g/dL (ref 3.5–5.0)
Alkaline Phosphatase: 62 U/L (ref 40–150)
BUN: 23.4 mg/dL (ref 7.0–26.0)
CALCIUM: 9.8 mg/dL (ref 8.4–10.4)
CHLORIDE: 100 meq/L (ref 98–109)
CO2: 27 mEq/L (ref 22–29)
Creatinine: 1.1 mg/dL (ref 0.7–1.3)
EGFR: 62 mL/min/{1.73_m2} — ABNORMAL LOW (ref >90)
Glucose: 107 mg/dl (ref 70–140)
POTASSIUM: 4.7 meq/L (ref 3.5–5.1)
Sodium: 135 mEq/L — ABNORMAL LOW (ref 136–145)
Total Bilirubin: 0.68 mg/dL (ref 0.20–1.20)
Total Protein: 6.4 g/dL (ref 6.4–8.3)

## 2015-11-05 LAB — CBC WITH DIFFERENTIAL/PLATELET
BASO%: 0.7 % (ref 0.0–2.0)
BASOS ABS: 0.1 10*3/uL (ref 0.0–0.1)
EOS%: 0.4 % (ref 0.0–7.0)
Eosinophils Absolute: 0 10*3/uL (ref 0.0–0.5)
HEMATOCRIT: 35 % — AB (ref 38.4–49.9)
HGB: 11.2 g/dL — ABNORMAL LOW (ref 13.0–17.1)
LYMPH#: 0.6 10*3/uL — AB (ref 0.9–3.3)
LYMPH%: 6.3 % — ABNORMAL LOW (ref 14.0–49.0)
MCH: 30.8 pg (ref 27.2–33.4)
MCHC: 32.1 g/dL (ref 32.0–36.0)
MCV: 96 fL (ref 79.3–98.0)
MONO#: 0.7 10*3/uL (ref 0.1–0.9)
MONO%: 7.6 % (ref 0.0–14.0)
NEUT#: 7.6 10*3/uL — ABNORMAL HIGH (ref 1.5–6.5)
NEUT%: 85 % — ABNORMAL HIGH (ref 39.0–75.0)
PLATELETS: 191 10*3/uL (ref 140–400)
RBC: 3.65 10*6/uL — ABNORMAL LOW (ref 4.20–5.82)
RDW: 17 % — ABNORMAL HIGH (ref 11.0–14.6)
WBC: 9 10*3/uL (ref 4.0–10.3)

## 2015-11-05 NOTE — Progress Notes (Signed)
[place] Telephone:([phone]) [phone]   Fax:([phone]) [phone]  OFFICE PROGRESS NOTE  [name], MD [place] [address]  DIAGNOSIS: Stage IB (T2a, N0, M0) non-small cell lung cancer favoring adenocarcinoma presented with superior segment left lower lobe lung mass   PRIOR THERAPY:  Status post thermal ablation at [facility] on 01/03/2015.   CURRENT THERAPY: Observation.  INTERVAL HISTORY: [name] 82 y.o. male returns to the clinic today for follow-up visit accompanied by his daughter. The patient is feeling fine today with no specific complaints except for shortness breath with exertion. He denied having any significant chest pain, cough or hemoptysis. He denied having any weight loss or night sweats. He has no nausea or vomiting, no fever or chills. He had a PET scan performed on July 2017 that showed no clear evidence for disease progression. For evaluation and discussion of his scan results and treatment options.  MEDICAL HISTORY: Past Medical History:  Diagnosis Date  . Adenocarcinoma of right lung, stage 1 ([place]) 12/11/2014  . Arthritis    osteoarthritis  . Asthma   . Atrial fibrillation ([place])   . Bowel obstruction ([place])   . Bradycardia   . Cancer ([place])    skin cancer  . COPD (chronic obstructive pulmonary disease) (HCC)    mild  . Diverticulosis of colon   . Hemorrhoids   . Hx of adenomatous colonic polyps 02/1997  . Hypertension   . 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[place])   . Palpitations     ALLERGIES:  is allergic to sulfa antibiotics and sulfonamide derivatives.  MEDICATIONS:  Current Outpatient Prescriptions  Medication Sig Dispense Refill  . alendronate (FOSAMAX) 70 MG tablet Take 70 mg by mouth once a week. Take with a full glass of water on an empty stomach.    Marland Kitchen apixaban (ELIQUIS) 5 MG TABS tablet Take 5 mg by mouth 2 (two) times daily.     Marland Kitchen CALCIUM PO Take 1,000 mg by mouth daily.    . cephALEXin (KEFLEX) 500 MG capsule  Take 1 capsule (500 mg total) by mouth 2 (two) times daily. 20 capsule 0  . cetirizine (ZYRTEC) 10 MG tablet Take 10 mg by mouth every evening.     . Cholecalciferol (VITAMIN D PO) Take 3 capsules by mouth daily.    . finasteride (PROSCAR) 5 MG tablet Take 5 mg by mouth daily.    . fluticasone (FLONASE) 50 MCG/ACT nasal spray Place 2 sprays into both nostrils every morning.    . Fluticasone-Salmeterol (ADVAIR) 250-50 MCG/DOSE AEPB Inhale 1 puff into the lungs 2 (two) times daily.    . furosemide (LASIX) 20 MG tablet Take 20 mg by mouth daily as needed for fluid.    . Guaifenesin 1200 MG TB12 Take 1,200 mg by mouth daily as needed (congestion).     Marland Kitchen HYDROcodone-acetaminophen (NORCO/VICODIN) 5-325 MG tablet Take 1 tablet by mouth every 6 (six) hours as needed.    . montelukast (SINGULAIR) 10 MG tablet Take 10 mg by mouth at bedtime.    Marland Kitchen omeprazole (PRILOSEC) 20 MG capsule Take 20 mg by mouth daily as needed (heartburn). Reported on 07/25/2015    . predniSONE (DELTASONE) 10 MG tablet Take 10 mg by mouth daily.    . Probiotic Product (PROBIOTIC PO) Take 1 capsule by mouth daily.    Marland Kitchen saccharomyces boulardii (FLORASTOR) 250 MG capsule Take 1 capsule (250 mg total) by mouth 2 (two) times daily. 30 capsule 0  .  temazepam (RESTORIL) 15 MG capsule Take 15-30 mg by mouth at bedtime as needed for sleep.     . Tiotropium Bromide Monohydrate (SPIRIVA RESPIMAT) 2.5 MCG/ACT AERS Inhale 2 puffs into the lungs daily.    . verapamil (COVERA HS) 180 MG (CO) 24 hr tablet Take 1 tablet (180 mg total) by mouth at bedtime. (Patient taking differently: Take 180 mg by mouth every evening. ) 30 tablet 5  . XOPENEX HFA 45 MCG/ACT inhaler 1-2 puffs every 6 (six) hours as needed for shortness of breath.     . zolpidem (AMBIEN) 10 MG tablet Take 20 mg by mouth at bedtime.      No current facility-administered medications for this visit.     SURGICAL HISTORY:  Past Surgical History:  Procedure Laterality Date  .  APPENDECTOMY    . COLON RESECTION    . CYSTOSCOPY    . LAMINECTOMY     Right L4-L5 Laminectomy  . ROTATOR CUFF REPAIR Right   . TOTAL HIP ARTHROPLASTY Right   . UMBILICAL HERNIA REPAIR    . VIDEO BRONCHOSCOPY WITH ENDOBRONCHIAL NAVIGATION N/A 12/18/2014   Procedure: VIDEO BRONCHOSCOPY WITH ENDOBRONCHIAL NAVIGATION ;  Surgeon: Collene Gobble, MD;  Location: [facility];  Service: Thoracic;  Laterality: N/A;    REVIEW OF SYSTEMS:  A comprehensive review of systems was negative except for: Respiratory: positive for dyspnea on exertion   PHYSICAL EXAMINATION: General appearance: alert, cooperative and no distress Head: Normocephalic, without obvious abnormality, atraumatic Neck: no adenopathy, no JVD, supple, symmetrical, trachea midline and thyroid not enlarged, symmetric, no tenderness/mass/nodules Lymph nodes: Cervical, supraclavicular, and axillary nodes normal. Resp: clear to auscultation bilaterally Back: symmetric, no curvature. ROM normal. No CVA tenderness. Cardio: regular rate and rhythm, S1, S2 normal, no murmur, click, rub or gallop GI: soft, non-tender; bowel sounds normal; no masses,  no organomegaly Extremities: extremities normal, atraumatic, no cyanosis or edema  ECOG PERFORMANCE STATUS: 1 - Symptomatic but completely ambulatory  Blood pressure 133/76, pulse 83, temperature 98 F (36.7 C), temperature source Oral, resp. rate 17, height '5\' 9"'$  (1.753 m), weight 171 lb 11.2 oz (77.9 kg), SpO2 94 %.  LABORATORY DATA: Lab Results  Component Value Date   WBC 9.0 11/05/2015   HGB 11.2 (L) 11/05/2015   HCT 35.0 (L) 11/05/2015   MCV 96.0 11/05/2015   PLT 191 11/05/2015      Chemistry      Component Value Date/Time   NA 135 (L) 11/05/2015 1305   K 4.7 11/05/2015 1305   CL 99 (L) 10/15/2015 1922   CO2 27 11/05/2015 1305   BUN 23.4 11/05/2015 1305   CREATININE 1.1 11/05/2015 1305      Component Value Date/Time   CALCIUM 9.8 11/05/2015 1305   ALKPHOS 62 11/05/2015 1305    AST 24 11/05/2015 1305   ALT 14 11/05/2015 1305   BILITOT 0.68 11/05/2015 1305       RADIOGRAPHIC STUDIES: Dg Elbow Complete Right (3+view)  Result Date: 10/13/2015 CLINICAL DATA:  Status post fall onto the elbow 5 days ago. Persistent olecranon region pain with swelling and redness EXAM: RIGHT ELBOW - COMPLETE 3+ VIEW COMPARISON:  None in PACs FINDINGS: There is a large amount of soft tissue swelling over the extensor aspect of the elbow. There is a tiny olecranon spur. No olecranon fracture is observed. The radial head is intact. The condylar and supracondylar regions of the distal humerus appear normal. IMPRESSION: Olecranon spurring with soft tissue swelling consistent with olecranon bursitis  or hematoma. No acute elbow fracture nor dislocation is observed. Electronically Signed   By: David  Martinique M.D.   On: 10/13/2015 12:18   Nm Pet Image Restag (ps) Skull Base To Thigh  Result Date: 10/13/2015 CLINICAL DATA:  Subsequent Treatment strategy for Left lung cancer. Status post cryotherapy. EXAM: NUCLEAR MEDICINE PET SKULL BASE TO THIGH TECHNIQUE: 8.4 mCi F-18 FDG was injected intravenously. Full-ring PET imaging was performed from the skull base to thigh after the radiotracer. CT data was obtained and used for attenuation correction and anatomic localization. FASTING BLOOD GLUCOSE:  Value: 94 mg/dl COMPARISON:  Chest CT 07/14/2015.  PET 04/15/2015. FINDINGS: NECK No areas of abnormal hypermetabolism. CHEST No thoracic nodal hypermetabolism. The area of consolidation within the superior segment left lower lobe the measures on the order of 3.7 x 3.2 cm on image 35/series 8. Decreased from 4.1 x 3.9 cm at the same level on the prior exam (when remeasured). This demonstrates a primarily low-level hypermetabolism with somewhat more focal hypermetabolism in its inferior most aspect. This measures a S.U.V. max of 5.4 today versus a S.U.V. max of 3.1 on the prior exam. No well-defined inferior nodule or mass  to correspond to the hypermetabolism. ABDOMEN/PELVIS No areas of abnormal hypermetabolism. SKELETON No abnormal marrow activity. CT IMAGES PERFORMED FOR ATTENUATION CORRECTION Cerebral and cerebellar atrophy which are age appropriate. Bilateral carotid atherosclerosis. No cervical adenopathy. Cardiomegaly. Multivessel coronary artery atherosclerosis. Aortic and branch vessel atherosclerosis. Pulmonary artery enlargement, including a 3.2 cm outflow tract. Small left pleural effusion is decreased since the prior PET. A small right pleural effusion is minimally increased since that exam. Right upper lobe cylindrical bronchiectasis persists and is likely post infectious or inflammatory. Right lower lobe 4 mm pulmonary nodule is unchanged, including on image 46/series 8. Right renal cyst. Old granulomatous disease in the liver and spleen. Normal adrenal glands. Abdominal aortic and branch vessel atherosclerosis. Left common iliac artery aneurysm and right common iliac artery ectasia are unchanged. Example left common iliac at 2.0 cm on image 147/series 5. Degraded evaluation of the pelvis, secondary to beam hardening artifact from right hip arthroplasty. IMPRESSION: 1. Increased hypermetabolism about the inferior aspect of the superior segment left lower lobe treatment site. No well-defined mass in this area. This warrants followup attention to exclude residual or recurrent disease. 2. No thoracic nodal or extrathoracic hypermetabolism to suggest metastatic disease. 3. Decreased small left and slightly increased small right pleural effusions. 4. Coronary artery atherosclerosis. Aortic atherosclerosis. Pulmonary artery enlargement suggests pulmonary arterial hypertension. Electronically Signed   By: Abigail Miyamoto M.D.   On: 10/13/2015 11:22    ASSESSMENT AND PLAN: This is a very pleasant 82 years old white male with stage IB non-small cell lung cancer, adenocarcinoma with superior segment left lower lobe lung mass  status post thermal ablation at [facility]. The patient has been observation since the procedure in October 2016 with no clear evidence for disease recurrence. I discussed the PET scan results with the patient and his daughter. I recommended for him to continue on observation with repeat CT scan of the chest in December 2017 for restaging of his disease. For the persistent anemia, I advised the patient to start taking over-the-counter iron tablets 1-2 tablets every day. I will see him back for follow-up visit in 4 months after repeating CT scan of the chest. He was advised to call immediately if he has any concerning symptoms in the interval. The patient voices understanding of current disease status and  treatment options and is in agreement with the current care plan.  All questions were answered. The patient knows to call the clinic with any problems, questions or concerns. We can certainly see the patient much sooner if necessary.  Disclaimer: This note was dictated with voice recognition software. Similar sounding words can inadvertently be transcribed and may not be corrected upon review.

## 2015-11-10 DIAGNOSIS — J301 Allergic rhinitis due to pollen: Secondary | ICD-10-CM | POA: Diagnosis not present

## 2015-11-10 DIAGNOSIS — J3089 Other allergic rhinitis: Secondary | ICD-10-CM | POA: Diagnosis not present

## 2015-11-11 DIAGNOSIS — M25561 Pain in right knee: Secondary | ICD-10-CM | POA: Diagnosis not present

## 2015-11-11 DIAGNOSIS — M1711 Unilateral primary osteoarthritis, right knee: Secondary | ICD-10-CM | POA: Diagnosis not present

## 2015-11-11 DIAGNOSIS — J301 Allergic rhinitis due to pollen: Secondary | ICD-10-CM | POA: Diagnosis not present

## 2015-11-11 DIAGNOSIS — J3089 Other allergic rhinitis: Secondary | ICD-10-CM | POA: Diagnosis not present

## 2015-11-13 ENCOUNTER — Telehealth: Payer: Self-pay | Admitting: Internal Medicine

## 2015-11-13 NOTE — Telephone Encounter (Signed)
CALLED PT TO CONF APPT. L/M. APPT LTR AND SCHD MAILED.

## 2015-11-18 DIAGNOSIS — J301 Allergic rhinitis due to pollen: Secondary | ICD-10-CM | POA: Diagnosis not present

## 2015-11-18 DIAGNOSIS — J3089 Other allergic rhinitis: Secondary | ICD-10-CM | POA: Diagnosis not present

## 2015-11-25 DIAGNOSIS — J3089 Other allergic rhinitis: Secondary | ICD-10-CM | POA: Diagnosis not present

## 2015-11-25 DIAGNOSIS — M7021 Olecranon bursitis, right elbow: Secondary | ICD-10-CM | POA: Diagnosis not present

## 2015-11-25 DIAGNOSIS — J301 Allergic rhinitis due to pollen: Secondary | ICD-10-CM | POA: Diagnosis not present

## 2015-11-27 ENCOUNTER — Ambulatory Visit (INDEPENDENT_AMBULATORY_CARE_PROVIDER_SITE_OTHER): Payer: Medicare Other | Admitting: Pulmonary Disease

## 2015-11-27 ENCOUNTER — Encounter: Payer: Self-pay | Admitting: Pulmonary Disease

## 2015-11-27 DIAGNOSIS — C3491 Malignant neoplasm of unspecified part of right bronchus or lung: Secondary | ICD-10-CM

## 2015-11-27 DIAGNOSIS — J4531 Mild persistent asthma with (acute) exacerbation: Secondary | ICD-10-CM | POA: Diagnosis not present

## 2015-11-27 DIAGNOSIS — Z23 Encounter for immunization: Secondary | ICD-10-CM | POA: Diagnosis not present

## 2015-11-27 NOTE — Progress Notes (Signed)
Subjective:    Patient ID: [name], male    DOB: 09-Aug-1924, 82 y.o.   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 He refused any sort of systemic chemotherapy and chose to have cryoablation at [facility]. Unfortunately this resulted in significant pleural effusions. 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 Brigitte Pulse.  HPI Chief Complaint  Patient presents with  . Follow-up    pt c/o occasional wheezing, nonprod cough, sob with exertion.     Reinhart says he has been doing OK. His breathing has been OK.  He has allergies from time to time, had a little wheezing this morning. He gets tired a lot says he is physically active.   He has been doing some leg strengthening exercises.  No problems with dyspnea.   He uses a walker at home for safety sake. He climbs stairs wihtout difficulty. He continues to take Advair and Spiriva regularly which help.    Past Medical History:  Diagnosis Date  . Adenocarcinoma of right lung, stage 1 ([place]) 12/11/2014  . Arthritis    osteoarthritis  . Asthma   . Atrial fibrillation ([place])   . Bowel obstruction ([place])   . Bradycardia   . Cancer ([place])    skin cancer  . COPD (chronic obstructive pulmonary disease) (HCC)    mild  . Diverticulosis of colon   . Hemorrhoids   . Hx of adenomatous colonic polyps 02/1997  . Hypertension   . 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 Palpitations       Review of Systems     Objective:   Physical Exam Vitals:   11/27/15 1127  BP: 128/66  Pulse: 81  SpO2: 95%  Weight: 170 lb 12.8 oz (77.5 kg)  Height: '5\' 9"'$  (1.753 m)    RA  Gen: chronically ill appearing HENT: NCAT OP clear, TM's clear, neck supple PULM: Few crackles bases, normal percussion CV: RRR, no mgr, trace edema GI: BS+, soft, nontender Derm: no cyanosis or rash Psyche: normal mood  and affect       Assessment & Plan:  Asthma This has been a stable interval for [place] without an exacerbation. He does well with his current regimen of Advair and Spiriva.  Plan: Flu shot today Advair Spiriva Follow-up 6 months  Adenocarcinoma of right lung, stage 1 ([place]) Fortunately there is been no evidence of progression or problems with his pleural effusions. He is receiving excellent care from Dr. Earlie Server.  He prefers to follow-up with [place] on an every six-month basis in case there is an issue with pleural effusions which recur again. I'm happy to see him on that basis    Current Outpatient Prescriptions:  .  alendronate (FOSAMAX) 70 MG tablet, Take 70 mg by mouth once a week. Take with a full glass of water on an empty stomach., Disp: , Rfl:  .  apixaban (ELIQUIS) 5 MG TABS tablet, Take 5 mg by mouth 2 (two) times daily. , Disp: , Rfl:  .  CALCIUM PO, Take 1,000 mg by mouth daily., Disp: , Rfl:  .  cetirizine (ZYRTEC) 10 MG tablet, Take 10 mg by mouth every evening. , Disp: , Rfl:  .  Cholecalciferol (VITAMIN D PO), Take 3 capsules by mouth daily., Disp: , Rfl:  .  finasteride (PROSCAR) 5 MG tablet, Take 5 mg by mouth  daily., Disp: , Rfl:  .  fluticasone (FLONASE) 50 MCG/ACT nasal spray, Place 2 sprays into both nostrils every morning., Disp: , Rfl:  .  Fluticasone-Salmeterol (ADVAIR) 250-50 MCG/DOSE AEPB, Inhale 1 puff into the lungs 2 (two) times daily., Disp: , Rfl:  .  HYDROcodone-acetaminophen (NORCO/VICODIN) 5-325 MG tablet, Take 1 tablet by mouth every 6 (six) hours as needed., Disp: , Rfl:  .  montelukast (SINGULAIR) 10 MG tablet, Take 10 mg by mouth at bedtime., Disp: , Rfl:  .  predniSONE (DELTASONE) 10 MG tablet, Take 10 mg by mouth daily., Disp: , Rfl:  .  Probiotic Product (PROBIOTIC PO), Take 1 capsule by mouth daily., Disp: , Rfl:  .  saccharomyces boulardii (FLORASTOR) 250 MG capsule, Take 1 capsule (250 mg total) by mouth 2 (two) times daily., Disp: 30  capsule, Rfl: 0 .  temazepam (RESTORIL) 15 MG capsule, Take 15-30 mg by mouth at bedtime as needed for sleep. , Disp: , Rfl:  .  Tiotropium Bromide Monohydrate (SPIRIVA RESPIMAT) 2.5 MCG/ACT AERS, Inhale 2 puffs into the lungs daily., Disp: , Rfl:  .  verapamil (COVERA HS) 180 MG (CO) 24 hr tablet, Take 1 tablet (180 mg total) by mouth at bedtime. (Patient taking differently: Take 180 mg by mouth every evening. ), Disp: 30 tablet, Rfl: 5 .  XOPENEX HFA 45 MCG/ACT inhaler, 1-2 puffs every 6 (six) hours as needed for shortness of breath. , Disp: , Rfl:  .  zolpidem (AMBIEN) 10 MG tablet, Take 20 mg by mouth at bedtime. , Disp: , Rfl:

## 2015-11-27 NOTE — Patient Instructions (Signed)
Keep taking her medications as you're doing We will see you back in 6 months or sooner needed

## 2015-11-27 NOTE — Assessment & Plan Note (Signed)
Fortunately there is been no evidence of progression or problems with his pleural effusions. He is receiving excellent care from Dr. Earlie Server.  He prefers to follow-up with [place] on an every six-month basis in case there is an issue with pleural effusions which recur again. I'm happy to see him on that basis

## 2015-11-27 NOTE — Assessment & Plan Note (Signed)
This has been a stable interval for [name] without an exacerbation. He does well with his current regimen of Advair and Spiriva.  Plan: Flu shot today Advair Spiriva Follow-up 6 months

## 2015-12-02 ENCOUNTER — Encounter: Payer: Self-pay | Admitting: Family Medicine

## 2015-12-02 ENCOUNTER — Ambulatory Visit (INDEPENDENT_AMBULATORY_CARE_PROVIDER_SITE_OTHER): Payer: Medicare Other | Admitting: Family Medicine

## 2015-12-02 DIAGNOSIS — J3089 Other allergic rhinitis: Secondary | ICD-10-CM | POA: Diagnosis not present

## 2015-12-02 DIAGNOSIS — M25521 Pain in right elbow: Secondary | ICD-10-CM | POA: Insufficient documentation

## 2015-12-02 DIAGNOSIS — J301 Allergic rhinitis due to pollen: Secondary | ICD-10-CM | POA: Diagnosis not present

## 2015-12-02 MED ORDER — DICLOFENAC SODIUM 1 % TD GEL: TRANSDERMAL | 1 refills | Status: DC

## 2015-12-02 NOTE — Assessment & Plan Note (Addendum)
Most likely his pain is related to the spur observed on x-ray as well as ultrasound today. Does not appear to have a bursitis while viewing with the ultrasound today. Had the discussion about trying nitroglycerin patch protocol but with his increased falls we will not try this - Try Voltaren gel. - encouraged ice and compression  - Encouraged to follow-up as needed.

## 2015-12-02 NOTE — Progress Notes (Signed)
  [name] - 82 y.o. male MRN [id number]  Date of birth: 01-11-1925  SUBJECTIVE:  Including CC & ROS.  Chief Complaint  Patient presents with  . 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 He reports that he fell in July and hit his olecranon. He has been having persistent pain since that time. He was seen in [place] who advised that he wrap and apply ice for olecranon bursitis. He is wanting to pursue a steroid injection today. His pain is mostly at night and he has to take pain medicines if it is significant or keeping him up at night. He denies any numbness, tingling, weakness, fevers, chills, or night sweats. He has no history of gout or pseudogout. He did develop cellulitis of the right arm but that has resolved.  ROS: No unexpected weight loss, fever, chills, swelling, instability, numbness/tingling, redness, otherwise see HPI    HISTORY: Past Medical, Surgical, Social, and Family History Reviewed & Updated per EMR.   Pertinent Historical Findings include: PMSHx -  HTN, Afib (eliquis), dyslipidemia, OA of right knee   PSHx -  No tobacco use, occasional alcohol use, retired  Medications - ambien, covera, singulair, advair, zytrec, eliquis   DATA REVIEWED: 10/13/15: right elbow x-ray: Olecranon spurring with soft tissue swelling consistent with olecranon bursitis or hematoma. No acute elbow fracture nor dislocation is observed.  PHYSICAL EXAM:  VS: BP:129/82  HR:(!) 54bpm  TEMP: ( )  RESP:   HT:'5\' 9"'$  (175.3 cm)   WT:165 lb (74.8 kg)  BMI:24.4 PHYSICAL EXAM: Gen: NAD, alert, cooperative with exam, elderly male HEENT: clear conjunctiva, EOMI CV:  no edema, capillary refill brisk,  Resp: non-labored, normal speech Skin: no rashes, normal turgor  Neuro: no gross deficits.  Psych:  alert and oriented Elbow:  Slight ecchymosis on the medial forearm. No significant enlargement of the olecranon. Tender to palpation of the  medial aspect of the olecranon. Normal range of motion of the elbow with no locking. Normal grip strength. Normal pulses intact distally. Neuro sensation distally.  Limited ultrasound: Right elbow: No obvious bursitis observed. Normal insertion of the triceps tendon. Spur of the medial olecranon located to site of maximal tenderness with increased vascularity.   ASSESSMENT & PLAN:   Right elbow pain Most likely his pain is related to the spur observed on x-ray as well as ultrasound today. Does not appear to have a bursitis while viewing with the ultrasound today. Had the discussion about trying nitroglycerin patch protocol but with his increased falls we will not try this - Try Voltaren gel. - encouraged ice and compression  - Encouraged to follow-up as needed.

## 2015-12-09 DIAGNOSIS — J3089 Other allergic rhinitis: Secondary | ICD-10-CM | POA: Diagnosis not present

## 2015-12-09 DIAGNOSIS — J301 Allergic rhinitis due to pollen: Secondary | ICD-10-CM | POA: Diagnosis not present

## 2015-12-12 DIAGNOSIS — M25521 Pain in right elbow: Secondary | ICD-10-CM | POA: Diagnosis not present

## 2015-12-15 DIAGNOSIS — M6289 Other specified disorders of muscle: Secondary | ICD-10-CM | POA: Diagnosis not present

## 2015-12-15 DIAGNOSIS — M25421 Effusion, right elbow: Secondary | ICD-10-CM | POA: Diagnosis not present

## 2015-12-15 DIAGNOSIS — Y939 Activity, unspecified: Secondary | ICD-10-CM | POA: Diagnosis not present

## 2015-12-15 DIAGNOSIS — M24221 Disorder of ligament, right elbow: Secondary | ICD-10-CM | POA: Diagnosis not present

## 2015-12-15 DIAGNOSIS — M66821 Spontaneous rupture of other tendons, right upper arm: Secondary | ICD-10-CM | POA: Diagnosis not present

## 2015-12-15 DIAGNOSIS — S46311A Strain of muscle, fascia and tendon of triceps, right arm, initial encounter: Secondary | ICD-10-CM | POA: Diagnosis not present

## 2015-12-15 DIAGNOSIS — R600 Localized edema: Secondary | ICD-10-CM | POA: Diagnosis not present

## 2015-12-16 DIAGNOSIS — J3089 Other allergic rhinitis: Secondary | ICD-10-CM | POA: Diagnosis not present

## 2015-12-16 DIAGNOSIS — J301 Allergic rhinitis due to pollen: Secondary | ICD-10-CM | POA: Diagnosis not present

## 2015-12-17 DIAGNOSIS — M1711 Unilateral primary osteoarthritis, right knee: Secondary | ICD-10-CM | POA: Diagnosis not present

## 2015-12-23 DIAGNOSIS — J301 Allergic rhinitis due to pollen: Secondary | ICD-10-CM | POA: Diagnosis not present

## 2015-12-23 DIAGNOSIS — J3089 Other allergic rhinitis: Secondary | ICD-10-CM | POA: Diagnosis not present

## 2015-12-24 DIAGNOSIS — M1711 Unilateral primary osteoarthritis, right knee: Secondary | ICD-10-CM | POA: Diagnosis not present

## 2015-12-29 DIAGNOSIS — Z7901 Long term (current) use of anticoagulants: Secondary | ICD-10-CM | POA: Diagnosis not present

## 2015-12-29 DIAGNOSIS — C3412 Malignant neoplasm of upper lobe, left bronchus or lung: Secondary | ICD-10-CM | POA: Diagnosis not present

## 2015-12-29 DIAGNOSIS — R140 Abdominal distension (gaseous): Secondary | ICD-10-CM | POA: Diagnosis not present

## 2015-12-29 DIAGNOSIS — J449 Chronic obstructive pulmonary disease, unspecified: Secondary | ICD-10-CM | POA: Diagnosis not present

## 2015-12-29 DIAGNOSIS — D6489 Other specified anemias: Secondary | ICD-10-CM | POA: Diagnosis not present

## 2015-12-29 DIAGNOSIS — E2749 Other adrenocortical insufficiency: Secondary | ICD-10-CM | POA: Diagnosis not present

## 2015-12-29 DIAGNOSIS — I10 Essential (primary) hypertension: Secondary | ICD-10-CM | POA: Diagnosis not present

## 2015-12-29 DIAGNOSIS — Z6824 Body mass index (BMI) 24.0-24.9, adult: Secondary | ICD-10-CM | POA: Diagnosis not present

## 2015-12-29 DIAGNOSIS — R7301 Impaired fasting glucose: Secondary | ICD-10-CM | POA: Diagnosis not present

## 2015-12-29 DIAGNOSIS — I480 Paroxysmal atrial fibrillation: Secondary | ICD-10-CM | POA: Diagnosis not present

## 2015-12-31 DIAGNOSIS — M1711 Unilateral primary osteoarthritis, right knee: Secondary | ICD-10-CM | POA: Diagnosis not present

## 2016-01-06 DIAGNOSIS — J301 Allergic rhinitis due to pollen: Secondary | ICD-10-CM | POA: Diagnosis not present

## 2016-01-06 DIAGNOSIS — J3089 Other allergic rhinitis: Secondary | ICD-10-CM | POA: Diagnosis not present

## 2016-01-12 DIAGNOSIS — S46311A Strain of muscle, fascia and tendon of triceps, right arm, initial encounter: Secondary | ICD-10-CM | POA: Diagnosis not present

## 2016-01-12 DIAGNOSIS — M24221 Disorder of ligament, right elbow: Secondary | ICD-10-CM | POA: Diagnosis not present

## 2016-01-13 DIAGNOSIS — J301 Allergic rhinitis due to pollen: Secondary | ICD-10-CM | POA: Diagnosis not present

## 2016-01-13 DIAGNOSIS — J3089 Other allergic rhinitis: Secondary | ICD-10-CM | POA: Diagnosis not present

## 2016-01-20 DIAGNOSIS — J3089 Other allergic rhinitis: Secondary | ICD-10-CM | POA: Diagnosis not present

## 2016-01-20 DIAGNOSIS — J301 Allergic rhinitis due to pollen: Secondary | ICD-10-CM | POA: Diagnosis not present

## 2016-01-27 DIAGNOSIS — J3089 Other allergic rhinitis: Secondary | ICD-10-CM | POA: Diagnosis not present

## 2016-01-27 DIAGNOSIS — J301 Allergic rhinitis due to pollen: Secondary | ICD-10-CM | POA: Diagnosis not present

## 2016-02-03 DIAGNOSIS — J3089 Other allergic rhinitis: Secondary | ICD-10-CM | POA: Diagnosis not present

## 2016-02-03 DIAGNOSIS — J301 Allergic rhinitis due to pollen: Secondary | ICD-10-CM | POA: Diagnosis not present

## 2016-02-11 DIAGNOSIS — J3089 Other allergic rhinitis: Secondary | ICD-10-CM | POA: Diagnosis not present

## 2016-02-11 DIAGNOSIS — J301 Allergic rhinitis due to pollen: Secondary | ICD-10-CM | POA: Diagnosis not present

## 2016-02-13 DIAGNOSIS — R062 Wheezing: Secondary | ICD-10-CM | POA: Diagnosis not present

## 2016-02-13 DIAGNOSIS — J984 Other disorders of lung: Secondary | ICD-10-CM | POA: Diagnosis not present

## 2016-02-13 DIAGNOSIS — I10 Essential (primary) hypertension: Secondary | ICD-10-CM | POA: Diagnosis not present

## 2016-02-13 DIAGNOSIS — Z85118 Personal history of other malignant neoplasm of bronchus and lung: Secondary | ICD-10-CM | POA: Diagnosis not present

## 2016-02-13 DIAGNOSIS — C3412 Malignant neoplasm of upper lobe, left bronchus or lung: Secondary | ICD-10-CM | POA: Diagnosis not present

## 2016-02-13 DIAGNOSIS — Z7901 Long term (current) use of anticoagulants: Secondary | ICD-10-CM | POA: Diagnosis not present

## 2016-02-13 DIAGNOSIS — R05 Cough: Secondary | ICD-10-CM | POA: Diagnosis not present

## 2016-02-13 DIAGNOSIS — Z6824 Body mass index (BMI) 24.0-24.9, adult: Secondary | ICD-10-CM | POA: Diagnosis not present

## 2016-02-13 DIAGNOSIS — J849 Interstitial pulmonary disease, unspecified: Secondary | ICD-10-CM | POA: Diagnosis not present

## 2016-02-13 DIAGNOSIS — J449 Chronic obstructive pulmonary disease, unspecified: Secondary | ICD-10-CM | POA: Diagnosis not present

## 2016-02-17 DIAGNOSIS — R2689 Other abnormalities of gait and mobility: Secondary | ICD-10-CM | POA: Diagnosis not present

## 2016-02-17 DIAGNOSIS — I480 Paroxysmal atrial fibrillation: Secondary | ICD-10-CM | POA: Diagnosis not present

## 2016-02-17 DIAGNOSIS — E2749 Other adrenocortical insufficiency: Secondary | ICD-10-CM | POA: Diagnosis not present

## 2016-02-17 DIAGNOSIS — Z7901 Long term (current) use of anticoagulants: Secondary | ICD-10-CM | POA: Diagnosis not present

## 2016-02-17 DIAGNOSIS — W19XXXA Unspecified fall, initial encounter: Secondary | ICD-10-CM | POA: Diagnosis not present

## 2016-02-17 DIAGNOSIS — J208 Acute bronchitis due to other specified organisms: Secondary | ICD-10-CM | POA: Diagnosis not present

## 2016-02-17 DIAGNOSIS — Z6825 Body mass index (BMI) 25.0-25.9, adult: Secondary | ICD-10-CM | POA: Diagnosis not present

## 2016-02-27 ENCOUNTER — Other Ambulatory Visit: Payer: Self-pay

## 2016-02-27 NOTE — Patient Outreach (Signed)
[name]) Care Management  02/27/2016  [name] [birth year] [id number]  REFERRAL DATE: 02/18/16 REFERRAL SOURCE; primary MD REFERRAL REASON: recurrent falls, decline in status, assess for resources CONSENT: self/ patient  PROVIDER: Dr. Marton [name]  SOCIAL:  Patient lives alone.  Able to still drive on occasion/ has help with transporation Patient has assistant at home 5 days per week from 8am to 1pm Patient has a cook from 5:30pm to 10:30pm every night. Patient has a daughter, [name] who stays with him 1 night per week  SUBJECTIVE: Telephone call to patient regarding primary MD referral. HIPAA verified with patient. Discussed and offered [name] care management services to patient. Patient agreed to social work services to assist with community resources. Patient refused nursing services.  Patient states his major concern is that he would like to have someone staying with him at night. Patient states he has had several falls with some injuries and he would like to have night time assistance. Patient states within the past year he has developed more difficulty with walking. Patient states his legs are weak.  Patient states when he is home he uses a walker and/or cane. Patient states he has had physical therapy a number of times with last time being within the past 4-5 months. Patient states he has COPD with occasional wheezing. Patient reports his COPD is very well controlled with his medication. Patient states he has atrial fibrillation that is controlled. Patient states he is no longer on any anticoagulant medication. Patient reports Dr. Brigitte [name] discontinue his eliquis approximately 1 week ago. Patient states he does have lung cancer. States he had an ablation for his lung cancer in October 2016. Patient states he still has some lung cancer now but it is not bothering him.   ASSESSMENT: Per patients MEDICAL RECORD NUMBERIncreased weakness in quadriceps and difficulty  standing up from a seated position.  Weakness in his shoulders with some pain when walking with walker. Several falls Non-small cell lung cancer diagnosed 9/ 2016.  Atrial fibrillation, Mild COPD, Loeffler's syndrome Patients wife with progressive dementia in [place].  PLAN; RNCM will refer patient to social worker to assist with community resources.   [name] Plowman RN,BSN,CCM [name] Telephonic  [phone]

## 2016-03-01 ENCOUNTER — Other Ambulatory Visit: Payer: Self-pay | Admitting: Licensed Clinical Social Worker

## 2016-03-01 ENCOUNTER — Encounter: Payer: Self-pay | Admitting: Licensed Clinical Social Worker

## 2016-03-01 DIAGNOSIS — J3089 Other allergic rhinitis: Secondary | ICD-10-CM | POA: Diagnosis not present

## 2016-03-01 DIAGNOSIS — J301 Allergic rhinitis due to pollen: Secondary | ICD-10-CM | POA: Diagnosis not present

## 2016-03-01 NOTE — Patient Outreach (Signed)
[facility]) Care Management  03/01/2016  Aramis Weil [place] [birth year] [id number]   Assessment- CSW received new referral on 02/27/16 for personal care resources. CSW completed initial outreach call to patient on 03/01/16. Patient's aide answered and provided phone to patient. Patient was able to provide HIPPA verifications. CSW introduced self, reason for call and of [facility] social work services. Patient is agreeable to social work assistance. Patient states he has an Environmental consultant that is with him 5 days per week from 8am to 1pm. Patient states he has a cook from 5:30pm to 10:30pm.  Patient feels he is able to manage between the hours of 1:00pm-5:30pm on his own. His major concern is nighttime. Patient states he also has a daughter that spends 1 night a week with him. Patient states the person does not have to be a trained nurse. Patient is agreeable to home visit tomorrow.   Plan-CSW will send involvement letter to PCP. CSW will complete home visit tomorrow.  Eula Fried, BSW, MSW, [email or web address] Phone: [phone] Fax: [phone]

## 2016-03-02 ENCOUNTER — Other Ambulatory Visit: Payer: Self-pay | Admitting: Licensed Clinical Social Worker
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
## 2016-03-02 NOTE — Patient Outreach (Signed)
[facility]) Care Management  03/02/2016  [name] 02-27-1925 [id number]   Request received from Eula Fried, LCSW to mail patient [facility] calendar. Mailed today, 03/02/16  [name], [place] Management Assistant

## 2016-03-03 NOTE — Patient Outreach (Signed)
[facility]) Care Management  03/03/2016  [name] 1924-08-17 [id number]  Request received from Eula Fried, LCSW to mail patient personal care resources, private duty care agency resources, and senior resource information. Information mailed today, 03/03/16  [name], [place] Management Assistant

## 2016-03-08 ENCOUNTER — Other Ambulatory Visit (INDEPENDENT_AMBULATORY_CARE_PROVIDER_SITE_OTHER): Payer: Medicare Other

## 2016-03-08 ENCOUNTER — Encounter: Payer: Self-pay | Admitting: Gastroenterology

## 2016-03-08 ENCOUNTER — Ambulatory Visit (INDEPENDENT_AMBULATORY_CARE_PROVIDER_SITE_OTHER): Payer: Medicare Other | Admitting: Gastroenterology

## 2016-03-08 VITALS — BP 100/70 | HR 80 | Ht 68.5 in | Wt 173.0 lb

## 2016-03-08 DIAGNOSIS — R140 Abdominal distension (gaseous): Secondary | ICD-10-CM

## 2016-03-08 DIAGNOSIS — Z86010 Personal history of colonic polyps: Secondary | ICD-10-CM | POA: Diagnosis not present

## 2016-03-08 DIAGNOSIS — R1031 Right lower quadrant pain: Secondary | ICD-10-CM

## 2016-03-08 LAB — CBC WITH DIFFERENTIAL/PLATELET
BASOS PCT: 0.3 % (ref 0.0–3.0)
Basophils Absolute: 0 10*3/uL (ref 0.0–0.1)
EOS PCT: 1.2 % (ref 0.0–5.0)
Eosinophils Absolute: 0.1 10*3/uL (ref 0.0–0.7)
HEMATOCRIT: 37.3 % — AB (ref 39.0–52.0)
HEMOGLOBIN: 12.4 g/dL — AB (ref 13.0–17.0)
Lymphocytes Relative: 11.3 % — ABNORMAL LOW (ref 12.0–46.0)
Lymphs Abs: 0.9 10*3/uL (ref 0.7–4.0)
MCHC: 33.3 g/dL (ref 30.0–36.0)
MCV: 99.2 fl (ref 78.0–100.0)
MONO ABS: 0.9 10*3/uL (ref 0.1–1.0)
MONOS PCT: 11.5 % (ref 3.0–12.0)
Neutro Abs: 6 10*3/uL (ref 1.4–7.7)
Neutrophils Relative %: 75.7 % (ref 43.0–77.0)
Platelets: 181 10*3/uL (ref 150.0–400.0)
RBC: 3.76 Mil/uL — ABNORMAL LOW (ref 4.22–5.81)
RDW: 15.9 % — AB (ref 11.5–15.5)
WBC: 8 10*3/uL (ref 4.0–10.5)

## 2016-03-08 LAB — COMPREHENSIVE METABOLIC PANEL
ALBUMIN: 4 g/dL (ref 3.5–5.2)
ALT: 16 U/L (ref 0–53)
AST: 20 U/L (ref 0–37)
Alkaline Phosphatase: 57 U/L (ref 39–117)
BUN: 20 mg/dL (ref 6–23)
CHLORIDE: 98 meq/L (ref 96–112)
CO2: 30 mEq/L (ref 19–32)
Calcium: 9.7 mg/dL (ref 8.4–10.5)
Creatinine, Ser: 1 mg/dL (ref 0.40–1.50)
GFR: 74.7 mL/min (ref >60.00)
Glucose, Bld: 104 mg/dL — ABNORMAL HIGH (ref 70–99)
POTASSIUM: 4.7 meq/L (ref 3.5–5.1)
SODIUM: 136 meq/L (ref 135–145)
Total Bilirubin: 0.5 mg/dL (ref 0.2–1.2)
Total Protein: 6.6 g/dL (ref 6.0–8.3)

## 2016-03-08 LAB — TSH: TSH: 2.97 u[IU]/mL (ref 0.35–4.50)

## 2016-03-08 MED ORDER — DICYCLOMINE HCL 10 MG PO CAPS: 10.0000 mg | ORAL_CAPSULE | Freq: Three times a day (TID) | ORAL | 5 refills | Status: AC

## 2016-03-08 NOTE — Progress Notes (Signed)
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 He is accompanied by his daughter. He relates persistent problems of abdominal bloating that often begin about 3-4 in the afternoon. States that limits his intake and his evening meal. His symptoms persist through the evening and by the next morning they have resolved. He states he intermittently gains temporary relief with use of an activated charcoal product. He has 2-4 small bowel movements per day. Symptoms are not associated with bowel movements. He frequently notes a sharp right lower quadrant pain that is not necessarily associated with abdominal bloating. He has a history of adenomatous colon polyps. His last colonoscopy was performed in 2008 and had small hyperplastic colon polyps. Status post left hemicolectomy. Denies weight loss, constipation, diarrhea, change in stool caliber, melena, hematochezia, nausea, vomiting, dysphagia, reflux symptoms, chest pain.   Allergies  Allergen Reactions  . Sulfa Antibiotics Other (See Comments)    Unsure of reaction  . Sulfonamide Derivatives Other (See Comments)    Unk   Outpatient Medications Prior to Visit  Medication Sig Dispense Refill  . alendronate (FOSAMAX) 70 MG tablet Take 70 mg by mouth once a week. Take with a full glass of water on an empty stomach.    Marland Kitchen CALCIUM PO Take 1,000 mg by mouth daily.    . cetirizine (ZYRTEC) 10 MG tablet Take 10 mg by mouth every evening.     . Cholecalciferol (VITAMIN D PO) Take 3 capsules by mouth daily.    . finasteride (PROSCAR) 5 MG tablet Take 5 mg by mouth daily.    . fluticasone (FLONASE) 50 MCG/ACT nasal spray Place 2 sprays into both nostrils every morning.    . Fluticasone-Salmeterol (ADVAIR) 250-50 MCG/DOSE AEPB Inhale 1 puff into the lungs 2 (two) times daily.    . montelukast (SINGULAIR) 10 MG tablet Take 10 mg by mouth at bedtime.     . predniSONE (DELTASONE) 10 MG tablet Take 10 mg by mouth daily.    . Probiotic Product (PROBIOTIC PO) Take 1 capsule by mouth daily.    Marland Kitchen saccharomyces boulardii (FLORASTOR) 250 MG capsule Take 1 capsule (250 mg total) by mouth 2 (two) times daily. 30 capsule 0  . temazepam (RESTORIL) 15 MG capsule Take 15-30 mg by mouth at bedtime as needed for sleep.     . Tiotropium Bromide Monohydrate (SPIRIVA RESPIMAT) 2.5 MCG/ACT AERS Inhale 2 puffs into the lungs daily.    . verapamil (COVERA HS) 180 MG (CO) 24 hr tablet Take 1 tablet (180 mg total) by mouth at bedtime. (Patient taking differently: Take 180 mg by mouth every evening. ) 30 tablet 5  . XOPENEX HFA 45 MCG/ACT inhaler 1-2 puffs every 6 (six) hours as needed for shortness of breath.     . zolpidem (AMBIEN) 10 MG tablet Take 20 mg by mouth at bedtime.     Marland Kitchen apixaban (ELIQUIS) 5 MG TABS tablet Take 5 mg by mouth 2 (two) times daily.     . diclofenac sodium (VOLTAREN) 1 % GEL Apply to the right elbow four times daily 100 g 1  . HYDROcodone-acetaminophen (NORCO/VICODIN) 5-325 MG tablet Take 1 tablet by mouth every 6 (six) hours as needed.     No facility-administered medications prior to visit.    Past Medical History:  Diagnosis Date  . Adenocarcinoma of right lung, stage 1 ([place]) 12/11/2014  . Arthritis  osteoarthritis  . Asthma   . Atrial fibrillation ([place])   . Bowel obstruction   . Bradycardia   . Cancer ([place])    skin cancer  . COPD (chronic obstructive pulmonary disease) ([facility])    mild  . Diverticulosis of colon   . Hemorrhoids   . Hx of adenomatous colonic polyps 02/1997  . Hyperlipidemia   . Hypertension   . 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 Palpitations   . Pleural effusion    Past Surgical History:  Procedure Laterality Date  . APPENDECTOMY    . COLON RESECTION    . CYSTOSCOPY    . LAMINECTOMY     Right L4-L5 Laminectomy  . ROTATOR CUFF REPAIR Right   . TOTAL HIP ARTHROPLASTY Right   . UMBILICAL HERNIA REPAIR    .  VIDEO BRONCHOSCOPY WITH ENDOBRONCHIAL NAVIGATION N/A 12/18/2014   Procedure: VIDEO BRONCHOSCOPY WITH ENDOBRONCHIAL NAVIGATION ;  Surgeon: Collene Gobble, MD;  Location: [facility];  Service: Thoracic;  Laterality: N/A;   Social History   Social History  . Marital status: Married    Spouse name: N/A  . Number of children: N/A  . Years of education: N/A   Occupational History  . retired physician    Social History Main Topics  . Smoking status: Former Smoker    Packs/day: 0.00    Years: 13.00    Types: Cigarettes    Quit date: 03/29/1956  . Smokeless tobacco: Never Used  . Alcohol use Yes     Comment: wine every night   . Drug use: No  . Sexual activity: Not Asked   Other Topics Concern  . 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 Stroke Mother 13  . Lung cancer Father 75  . Leukemia Brother   . Alzheimer's disease Sister   . Autoimmune disease Brother     Polyarteritis nodosa     Review of Systems: Pertinent positive and negative review of systems were noted in the above HPI section. All other review of systems were otherwise negative.   Physical Exam: General: Well developed, well nourished, no acute distress Head: Normocephalic and atraumatic Eyes:  sclerae anicteric, EOMI Ears: Normal auditory acuity Mouth: No deformity or lesions Neck: Supple, no masses or thyromegaly Lungs: Clear throughout to auscultation Heart: Regular rate and rhythm; no murmurs, rubs or bruits Abdomen: Soft, mild right lower quadrant tenderness and non distended. Very small rounded subcutaneous nodularity associated with suprapubic incision. He has a small hematoma inferior to his umbilicus. No masses, hepatosplenomegaly or hernias noted. Normal Bowel sounds Rectal: deferred Musculoskeletal: Symmetrical with no gross deformities  Skin: No lesions on visible extremities Pulses:  Normal pulses  noted Extremities: No clubbing, cyanosis, edema or deformities noted Neurological: Alert oriented x 4, grossly nonfocal Cervical Nodes:  No significant cervical adenopathy Inguinal Nodes: No significant inguinal adenopathy Psychological:  Alert and cooperative. Normal mood and affect  Assessment and Recommendations:  1. Intermittent right lower quadrant pain and frequent abdominal bloating, etiology unclear. CMP, CBC, TSH. Schedule abdomen/pelvis CT. Low gas diet. Activated charcoal 3 times a day. Dicyclomine 10 mg 4 times a day when necessary. Trial of 3-4 different probiotics. Return office visit in one month.  2. Personal history of adenomatous colon polyps. Surveillance colonoscopies were discontinued due to age.  3. Diverticulosis.   cc: [name], MD [address] [place], [place] [address]

## 2016-03-08 NOTE — Patient Instructions (Signed)
Your physician has requested that you go to the basement for lab work before leaving today.  We have sent the following medications to your pharmacy for you to pick up at your convenience: Bentyl.  You have been given a gas prevention diet.   You have been scheduled for a CT scan of the abdomen and pelvis at [facility] ([address]---this is in the same building as Press [facility]).   You are scheduled on 03/11/16 at 3:30pm. You should arrive 15 minutes prior to your appointment time for registration. Please follow the written instructions below on the day of your exam:  WARNING: IF YOU ARE ALLERGIC TO IODINE/X-RAY DYE, PLEASE NOTIFY RADIOLOGY IMMEDIATELY AT [phone]! YOU WILL BE GIVEN A 13 HOUR PREMEDICATION PREP.  1) Do not eat or drink anything after 11:30am (4 hours prior to your test) 2) You have been given 2 bottles of oral contrast to drink. The solution may taste               better if refrigerated, but do NOT add ice or any other liquid to this solution. Shake             well before drinking.    Drink 1 bottle of contrast @ 1:30pm (2 hours prior to your exam)  Drink 1 bottle of contrast @ 2:30pm (1 hour prior to your exam)  You may take any medications as prescribed with a small amount of water except for the following: Metformin, Glucophage, Glucovance, Avandamet, Riomet, Fortamet, Actoplus Met, Janumet, Glumetza or Metaglip. The above medications must be held the day of the exam AND 48 hours after the exam.  The purpose of you drinking the oral contrast is to aid in the visualization of your intestinal tract. The contrast solution may cause some diarrhea. Before your exam is started, you will be given a small amount of fluid to drink. Depending on your individual set of symptoms, you may also receive an intravenous injection of x-ray contrast/dye. Plan on being at [facility] for 30 minutes or longer, depending on the type of exam you are having  performed.  This test typically takes 30-45 minutes to complete.  If you have any questions regarding your exam or if you need to reschedule, you may call the CT department at [phone] between the hours of 8:00 am and 5:00 pm, Monday-Friday.  ________________________________________________________________________  Thank you for choosing me and [facility].  Pricilla Riffle. Dagoberto Ligas., MD., Mar[place] Regal

## 2016-03-09 ENCOUNTER — Other Ambulatory Visit: Payer: Self-pay

## 2016-03-09 ENCOUNTER — Other Ambulatory Visit (INDEPENDENT_AMBULATORY_CARE_PROVIDER_SITE_OTHER): Payer: Medicare Other

## 2016-03-09 DIAGNOSIS — D649 Anemia, unspecified: Secondary | ICD-10-CM

## 2016-03-09 DIAGNOSIS — J3089 Other allergic rhinitis: Secondary | ICD-10-CM | POA: Diagnosis not present

## 2016-03-09 DIAGNOSIS — D508 Other iron deficiency anemias: Secondary | ICD-10-CM

## 2016-03-09 DIAGNOSIS — J301 Allergic rhinitis due to pollen: Secondary | ICD-10-CM | POA: Diagnosis not present

## 2016-03-09 LAB — IBC PANEL
Iron: 52 ug/dL (ref 42–165)
Saturation Ratios: 12.1 % — ABNORMAL LOW (ref 20.0–50.0)
TRANSFERRIN: 306 mg/dL (ref 212.0–360.0)

## 2016-03-09 LAB — FERRITIN: Ferritin: 43.1 ng/mL (ref 22.0–322.0)

## 2016-03-09 LAB — FOLATE: FOLATE: 8.2 ng/mL (ref >5.9)

## 2016-03-09 LAB — VITAMIN B12: Vitamin B-12: 242 pg/mL (ref 211–911)

## 2016-03-10 ENCOUNTER — Emergency Department (HOSPITAL_COMMUNITY): Payer: Medicare Other

## 2016-03-10 ENCOUNTER — Observation Stay (HOSPITAL_COMMUNITY)
Admission: EM | Admit: 2016-03-10 | Discharge: 2016-03-10 | Disposition: A | Discharge status: Home / Self Care | Payer: Medicare Other | Attending: Internal Medicine | Admitting: Internal Medicine

## 2016-03-10 ENCOUNTER — Telehealth: Payer: Self-pay | Admitting: Gastroenterology

## 2016-03-10 ENCOUNTER — Encounter (HOSPITAL_COMMUNITY): Payer: Self-pay | Admitting: Emergency Medicine

## 2016-03-10 DIAGNOSIS — Z7409 Other reduced mobility: Secondary | ICD-10-CM | POA: Diagnosis not present

## 2016-03-10 DIAGNOSIS — I10 Essential (primary) hypertension: Secondary | ICD-10-CM | POA: Diagnosis not present

## 2016-03-10 DIAGNOSIS — W1830XA Fall on same level, unspecified, initial encounter: Secondary | ICD-10-CM | POA: Insufficient documentation

## 2016-03-10 DIAGNOSIS — Z801 Family history of malignant neoplasm of trachea, bronchus and lung: Secondary | ICD-10-CM | POA: Insufficient documentation

## 2016-03-10 DIAGNOSIS — Z7952 Long term (current) use of systemic steroids: Secondary | ICD-10-CM | POA: Diagnosis not present

## 2016-03-10 DIAGNOSIS — R7989 Other specified abnormal findings of blood chemistry: Secondary | ICD-10-CM | POA: Diagnosis not present

## 2016-03-10 DIAGNOSIS — Z85118 Personal history of other malignant neoplasm of bronchus and lung: Secondary | ICD-10-CM | POA: Insufficient documentation

## 2016-03-10 DIAGNOSIS — I480 Paroxysmal atrial fibrillation: Secondary | ICD-10-CM | POA: Diagnosis not present

## 2016-03-10 DIAGNOSIS — Z882 Allergy status to sulfonamides status: Secondary | ICD-10-CM | POA: Diagnosis not present

## 2016-03-10 DIAGNOSIS — Z7951 Long term (current) use of inhaled steroids: Secondary | ICD-10-CM | POA: Insufficient documentation

## 2016-03-10 DIAGNOSIS — Z823 Family history of stroke: Secondary | ICD-10-CM | POA: Insufficient documentation

## 2016-03-10 DIAGNOSIS — Z85828 Personal history of other malignant neoplasm of skin: Secondary | ICD-10-CM | POA: Insufficient documentation

## 2016-03-10 DIAGNOSIS — I4821 Permanent atrial fibrillation: Secondary | ICD-10-CM | POA: Diagnosis present

## 2016-03-10 DIAGNOSIS — S43085A Other dislocation of left shoulder joint, initial encounter: Principal | ICD-10-CM | POA: Insufficient documentation

## 2016-03-10 DIAGNOSIS — Z8719 Personal history of other diseases of the digestive system: Secondary | ICD-10-CM | POA: Insufficient documentation

## 2016-03-10 DIAGNOSIS — I472 Ventricular tachycardia: Secondary | ICD-10-CM | POA: Insufficient documentation

## 2016-03-10 DIAGNOSIS — S43005A Unspecified dislocation of left shoulder joint, initial encounter: Secondary | ICD-10-CM | POA: Diagnosis present

## 2016-03-10 DIAGNOSIS — Z87891 Personal history of nicotine dependence: Secondary | ICD-10-CM | POA: Diagnosis not present

## 2016-03-10 DIAGNOSIS — M79602 Pain in left arm: Secondary | ICD-10-CM | POA: Diagnosis not present

## 2016-03-10 DIAGNOSIS — J449 Chronic obstructive pulmonary disease, unspecified: Secondary | ICD-10-CM | POA: Diagnosis not present

## 2016-03-10 DIAGNOSIS — S43015A Anterior dislocation of left humerus, initial encounter: Secondary | ICD-10-CM | POA: Diagnosis not present

## 2016-03-10 DIAGNOSIS — Z9181 History of falling: Secondary | ICD-10-CM | POA: Insufficient documentation

## 2016-03-10 DIAGNOSIS — M1990 Unspecified osteoarthritis, unspecified site: Secondary | ICD-10-CM | POA: Diagnosis not present

## 2016-03-10 DIAGNOSIS — Z8679 Personal history of other diseases of the circulatory system: Secondary | ICD-10-CM

## 2016-03-10 DIAGNOSIS — R778 Other specified abnormalities of plasma proteins: Secondary | ICD-10-CM | POA: Insufficient documentation

## 2016-03-10 DIAGNOSIS — Y92009 Unspecified place in unspecified non-institutional (private) residence as the place of occurrence of the external cause: Secondary | ICD-10-CM | POA: Diagnosis not present

## 2016-03-10 DIAGNOSIS — Z96641 Presence of right artificial hip joint: Secondary | ICD-10-CM | POA: Insufficient documentation

## 2016-03-10 DIAGNOSIS — E785 Hyperlipidemia, unspecified: Secondary | ICD-10-CM | POA: Diagnosis not present

## 2016-03-10 DIAGNOSIS — Z789 Other specified health status: Secondary | ICD-10-CM

## 2016-03-10 DIAGNOSIS — W19XXXA Unspecified fall, initial encounter: Secondary | ICD-10-CM

## 2016-03-10 DIAGNOSIS — R52 Pain, unspecified: Secondary | ICD-10-CM | POA: Diagnosis not present

## 2016-03-10 LAB — I-STAT TROPONIN, ED: TROPONIN I, POC: 0.11 ng/mL — AB (ref 0.00–0.08)

## 2016-03-10 LAB — CBC WITH DIFFERENTIAL/PLATELET
BASOS ABS: 0 10*3/uL (ref 0.0–0.1)
BASOS PCT: 0 %
Eosinophils Absolute: 0 10*3/uL (ref 0.0–0.7)
Eosinophils Relative: 0 %
HEMATOCRIT: 31.4 % — AB (ref 39.0–52.0)
Hemoglobin: 10.3 g/dL — ABNORMAL LOW (ref 13.0–17.0)
Lymphocytes Relative: 6 %
Lymphs Abs: 0.5 10*3/uL — ABNORMAL LOW (ref 0.7–4.0)
MCH: 32.7 pg (ref 26.0–34.0)
MCHC: 32.8 g/dL (ref 30.0–36.0)
MCV: 99.7 fL (ref 78.0–100.0)
Monocytes Absolute: 1.2 10*3/uL — ABNORMAL HIGH (ref 0.1–1.0)
Monocytes Relative: 14 %
NEUTROS ABS: 6.8 10*3/uL (ref 1.7–7.7)
NEUTROS PCT: 80 %
Platelets: 141 10*3/uL — ABNORMAL LOW (ref 150–400)
RBC: 3.15 MIL/uL — AB (ref 4.22–5.81)
RDW: 14.9 % (ref 11.5–15.5)
WBC: 8.6 10*3/uL (ref 4.0–10.5)

## 2016-03-10 LAB — URINALYSIS, ROUTINE W REFLEX MICROSCOPIC
BILIRUBIN URINE: NEGATIVE
GLUCOSE, UA: NEGATIVE mg/dL
HGB URINE DIPSTICK: NEGATIVE
Ketones, ur: NEGATIVE mg/dL
Leukocytes, UA: NEGATIVE
Nitrite: NEGATIVE
PROTEIN: NEGATIVE mg/dL
SPECIFIC GRAVITY, URINE: 1.018 (ref 1.005–1.030)
pH: 5 (ref 5.0–8.0)

## 2016-03-10 LAB — BASIC METABOLIC PANEL
ANION GAP: 10 (ref 5–15)
BUN: 20 mg/dL (ref 6–20)
CALCIUM: 8.9 mg/dL (ref 8.9–10.3)
CO2: 24 mmol/L (ref 22–32)
Chloride: 104 mmol/L (ref 101–111)
Creatinine, Ser: 1 mg/dL (ref 0.61–1.24)
Glucose, Bld: 93 mg/dL (ref 65–99)
Potassium: 3.8 mmol/L (ref 3.5–5.1)
SODIUM: 138 mmol/L (ref 135–145)

## 2016-03-10 LAB — PROTIME-INR
INR: 1.16
PROTHROMBIN TIME: 14.8 s (ref 11.4–15.2)

## 2016-03-10 MED ORDER — SODIUM CHLORIDE 0.9 % IV SOLN
INTRAVENOUS | Status: AC | PRN
  Administered 2016-03-10: 100 mL/h via INTRAVENOUS

## 2016-03-10 MED ORDER — HYDROCODONE-ACETAMINOPHEN 5-325 MG PO TABS: 1.0000 | ORAL_TABLET | ORAL | 0 refills | Status: DC | PRN

## 2016-03-10 MED ORDER — FINASTERIDE 5 MG PO TABS
10.0000 mg | ORAL_TABLET | Freq: Every day | ORAL | Status: DC
  Administered 2016-03-10: 10 mg via ORAL
  Filled 2016-03-10: qty 2

## 2016-03-10 MED ORDER — TIOTROPIUM BROMIDE MONOHYDRATE 18 MCG IN CAPS
18.0000 ug | ORAL_CAPSULE | Freq: Every day | RESPIRATORY_TRACT | Status: DC
  Filled 2016-03-10: qty 5

## 2016-03-10 MED ORDER — ACETAMINOPHEN 325 MG PO TABS: 650.0000 mg | ORAL_TABLET | Freq: Four times a day (QID) | ORAL | Status: DC | PRN

## 2016-03-10 MED ORDER — ALBUTEROL SULFATE (2.5 MG/3ML) 0.083% IN NEBU: 2.5000 mg | INHALATION_SOLUTION | Freq: Four times a day (QID) | RESPIRATORY_TRACT | Status: DC | PRN

## 2016-03-10 MED ORDER — HYDROMORPHONE HCL 1 MG/ML IJ SOLN: 0.5000 mg | INTRAMUSCULAR | Status: DC | PRN

## 2016-03-10 MED ORDER — HYDROCODONE-ACETAMINOPHEN 5-325 MG PO TABS
1.0000 | ORAL_TABLET | ORAL | Status: DC | PRN
  Administered 2016-03-10: 1 via ORAL
  Filled 2016-03-10: qty 1

## 2016-03-10 MED ORDER — PREDNISONE 10 MG PO TABS
10.0000 mg | ORAL_TABLET | Freq: Every day | ORAL | Status: DC
  Administered 2016-03-10: 10 mg via ORAL
  Filled 2016-03-10: qty 1

## 2016-03-10 MED ORDER — ZOLPIDEM TARTRATE 5 MG PO TABS: 5.0000 mg | ORAL_TABLET | Freq: Every day | ORAL | Status: DC

## 2016-03-10 MED ORDER — ASPIRIN 81 MG PO CHEW
81.0000 mg | CHEWABLE_TABLET | Freq: Once | ORAL | Status: AC
  Administered 2016-03-10: 81 mg via ORAL
  Filled 2016-03-10: qty 1

## 2016-03-10 MED ORDER — FLUTICASONE PROPIONATE 50 MCG/ACT NA SUSP
2.0000 | Freq: Every morning | NASAL | Status: DC
  Administered 2016-03-10: 2 via NASAL
  Filled 2016-03-10: qty 16

## 2016-03-10 MED ORDER — FLUTICASONE FUROATE-VILANTEROL 200-25 MCG/INH IN AEPB
1.0000 | INHALATION_SPRAY | Freq: Every day | RESPIRATORY_TRACT | Status: DC
  Administered 2016-03-10: 1 via RESPIRATORY_TRACT
  Filled 2016-03-10: qty 28

## 2016-03-10 MED ORDER — PROPOFOL 10 MG/ML IV BOLUS
INTRAVENOUS | Status: AC | PRN
  Administered 2016-03-10 (×3): 40 mg via INTRAVENOUS

## 2016-03-10 MED ORDER — TEMAZEPAM 15 MG PO CAPS: 15.0000 mg | ORAL_CAPSULE | Freq: Every evening | ORAL | Status: DC | PRN

## 2016-03-10 MED ORDER — HYDROCODONE-ACETAMINOPHEN 5-325 MG PO TABS
1.0000 | ORAL_TABLET | Freq: Once | ORAL | Status: AC
Start: 2016-03-10 — End: 2016-03-10
  Administered 2016-03-10: 1 via ORAL
  Filled 2016-03-10: qty 1

## 2016-03-10 MED ORDER — HYDROMORPHONE HCL 2 MG/ML IJ SOLN
0.5000 mg | Freq: Once | INTRAMUSCULAR | Status: AC
  Administered 2016-03-10: 0.5 mg via INTRAVENOUS
  Filled 2016-03-10: qty 1

## 2016-03-10 MED ORDER — VERAPAMIL HCL ER 180 MG PO TBCR
180.0000 mg | EXTENDED_RELEASE_TABLET | Freq: Every evening | ORAL | Status: DC
  Filled 2016-03-10: qty 1

## 2016-03-10 MED ORDER — PROPOFOL 10 MG/ML IV BOLUS
0.5000 mg/kg | Freq: Once | INTRAVENOUS | Status: AC
  Administered 2016-03-10: 39.3 mg via INTRAVENOUS
  Filled 2016-03-10: qty 20

## 2016-03-10 MED ORDER — NALOXONE HCL 0.4 MG/ML IJ SOLN
INTRAMUSCULAR | Status: AC
  Filled 2016-03-10: qty 1

## 2016-03-10 MED ORDER — MORPHINE SULFATE (PF) 4 MG/ML IV SOLN
4.0000 mg | Freq: Once | INTRAVENOUS | Status: AC
  Administered 2016-03-10: 4 mg via INTRAVENOUS
  Filled 2016-03-10: qty 1

## 2016-03-10 MED ORDER — MONTELUKAST SODIUM 10 MG PO TABS: 10.0000 mg | ORAL_TABLET | Freq: Every day | ORAL | Status: DC

## 2016-03-10 MED ORDER — NALOXONE HCL 0.4 MG/ML IJ SOLN
INTRAMUSCULAR | Status: AC | PRN
  Administered 2016-03-10: 0.4 mg via INTRAVENOUS

## 2016-03-10 MED ORDER — HYDROMORPHONE HCL 2 MG/ML IJ SOLN: 0.5000 mg | Freq: Once | INTRAMUSCULAR | Status: DC

## 2016-03-10 MED ORDER — DICYCLOMINE HCL 10 MG PO CAPS
10.0000 mg | ORAL_CAPSULE | Freq: Three times a day (TID) | ORAL | Status: DC
  Administered 2016-03-10: 10 mg via ORAL
  Filled 2016-03-10 (×4): qty 1

## 2016-03-10 MED ORDER — HYDROMORPHONE HCL 1 MG/ML IJ SOLN
0.5000 mg | Freq: Once | INTRAMUSCULAR | Status: AC
  Administered 2016-03-10: 0.5 mg via INTRAVENOUS
  Filled 2016-03-10: qty 1

## 2016-03-10 NOTE — Progress Notes (Signed)
[name] to be D/C'd Home per MD order. Discussed with the patient and all questions fully answered.    Medication List    STOP taking these medications   AMBIEN 10 MG tablet Generic drug:  zolpidem     TAKE these medications   albuterol 108 (90 Base) MCG/ACT inhaler Commonly known as:  PROVENTIL HFA;VENTOLIN HFA Inhale 1-2 puffs into the lungs every 6 (six) hours as needed for wheezing or shortness of breath.   alendronate 70 MG tablet Commonly known as:  FOSAMAX Take 70 mg by mouth once a week. Take with a full glass of water on an empty stomach.   CALCIUM PO Take 1,000 mg by mouth daily.   cetirizine 10 MG tablet Commonly known as:  ZYRTEC Take 10 mg by mouth every evening.   dicyclomine 10 MG capsule Commonly known as:  BENTYL Take 1 capsule (10 mg total) by mouth 4 (four) times daily -  before meals and at bedtime.   finasteride 5 MG tablet Commonly known as:  PROSCAR Take 10 mg by mouth daily.   fluticasone 50 MCG/ACT nasal spray Commonly known as:  FLONASE Place 2 sprays into both nostrils every morning.   Fluticasone-Salmeterol 250-50 MCG/DOSE Aepb Commonly known as:  ADVAIR Inhale 1 puff into the lungs 2 (two) times daily.   HYDROcodone-acetaminophen 5-325 MG tablet Commonly known as:  NORCO/VICODIN Take 1-2 tablets by mouth every 4 (four) hours as needed for moderate pain.   montelukast 10 MG tablet Commonly known as:  SINGULAIR Take 10 mg by mouth at bedtime.   predniSONE 10 MG tablet Commonly known as:  DELTASONE Take 10 mg by mouth daily.   PROBIOTIC PO Take 1 capsule by mouth daily.   SPIRIVA RESPIMAT 2.5 MCG/ACT Aers Generic drug:  Tiotropium Bromide Monohydrate Inhale 2 puffs into the lungs daily.   temazepam 15 MG capsule Commonly known as:  RESTORIL Take 15-30 mg by mouth at bedtime as needed for sleep.   verapamil 180 MG (CO) 24 hr tablet Commonly known as:  COVERA HS Take 1 tablet (180 mg total) by mouth at bedtime. What  changed:  when to take this   VITAMIN D PO Take 3 capsules by mouth daily.            Durable Medical Equipment        Start     Ordered   03/10/16 1127  For home use only DME lightweight manual wheelchair with seat cushion  Once    Comments:  Patient suffers from dislocated shoulder which impairs their ability to perform daily activities like dressing in the home.  A cane will not resolve  issue with performing activities of daily living. A wheelchair will allow patient to safely perform daily activities. Patient is not able to propel themselves in the home using a standard weight wheelchair due to general weakness. Patient can self propel in the lightweight wheelchair.  Accessories: elevating leg rests (ELRs), wheel locks, extensions and anti-tippers.   03/10/16 1126      VVS, Skin clean, dry and intact without evidence of skin break down, no evidence of skin tears noted.  IV catheter discontinued intact. Site without signs and symptoms of complications. Dressing and pressure applied.  An After Visit Summary was printed and given to the patient.  Patient escorted via [place], and D/C home via private auto.  [name]  03/10/2016 6:05 PM

## 2016-03-10 NOTE — Telephone Encounter (Signed)
Patient has been discharged home.  We will wait for the patient's daughter to call back and reschedule.    Dr. Fuller Plan,  [name] He fell and dislocated his shoulder, but they sent him home.

## 2016-03-10 NOTE — Progress Notes (Signed)
Orthopedic Tech Progress Note Patient Details:  [name] 1924-10-18 [id number]  Ortho Devices Type of Ortho Device: Sling immobilizer Ortho Device/Splint Location: lue Ortho Device/Splint Interventions: Ordered, Application   Karolee Stamps 03/10/2016, 5:29 AM

## 2016-03-10 NOTE — Progress Notes (Signed)
Clinical Social Worker met with patient at bedside with daughter present. CSW spoke to family about SNF placement and if insurance would cover stay. Patient stated he does not want to go to SNF but does want 24hr supervision at home. Patient stated he is more interested in ALF than a SNF since insurance wont cover SNF placement. CSW contacted insurance company to verify that they wont pay for SNF placement. CSW spoke to [place] (reference# [id number]) and she stated that patient would have to be inpatient for 3 days for insurance to cover. CSW gave patient a list of private facility that could provide 24hr supervision. CSW no longer needed for d/c to SNF at this time.  Rhea Pink, MSW,  [place]

## 2016-03-10 NOTE — Care Management Note (Signed)
Case Management Note Marvetta Gibbons RN, BSN Unit 2W-Case Manager [phone]  Patient Details  Name: [name] MRN: [id number] Date of Birth: 1924-06-07  Subjective/Objective:  Pt admitted s/p fall with shoulder dislocation                  Action/Plan: PTA pt lived at home- [place] saw pt for possible STSNF-however pt's insurance will not cover as pt does not have a qualifying stay- pt and family interested in home with [facility] and private duty assistance- list provided for private duty agencies- also provided choice for [facility] services- per pt he has used [place] in the past would like to use them again- now known as [facility]- order also placed for w/c- call made to MD for [facility] orders- referral called to [place] with [facility]- for HHPT/OT- also notified Shannon with [facility] for DME- wheelchair- to be delivered to room prior to discharge- possibly home later today.   Expected Discharge Date:    03/10/16              Expected Discharge Plan:  [facility]  In-House Referral:  Clinical Social Work  Discharge planning Services  CM Consult  Post Acute Care Choice:  Home Health, Durable Medical Equipment Choice offered to:  Patient, Adult Children  DME Arranged:  Programmer, multimedia DME Agency:  [place]:  PT, OT [place] Agency:  [facility] (formerly [facility])  Status of Service:  Completed, signed off  If discussed at [employer] of Stay Meetings, dates discussed:    Discharge Disposition: home with home health  Additional Comments:  Dawayne Patricia, RN 03/10/2016, 12:48 PM

## 2016-03-10 NOTE — Evaluation (Signed)
Physical Therapy Evaluation and discharge Patient Details Name: [name] MRN: [id number] DOB: May 13, 1924 Today's Date: 03/10/2016   History of Present Illness  Pt is an 82 y.o. male s/p fall on LUE at home. Pt found to have L shoulder dislocation that was reduced in the ED. PMHx: Arthritis, Asthma, A-fib, Bradycardia, Cancer, COPD, Hyperlipidemia, HTN, Lofflers syndrome, Pleural effusion.     Clinical Impression  Pt admitted with above diagnosis. Pt currently with functional limitations due to the deficits listed below (see PT Problem List). Educated patient and family member that he MUST have someone with him 24/7 and they must assist him with all mobility (at least minguard assist to incr safety in home environment). Educated that he will use wheelchair for primary locomotion (and may be able to learn to do this independently), however should continue to walk with someone hands-on assisting him. Pt will benefit from skilled PT to increase their independence and safety with mobility in his home environment. Patient is agreeable. Per pt/family patient is discharging home today.    Follow Up Recommendations Supervision/Assistance - 24 hour; SNF (however insurance will not cover due to observation status; family arranging for 24/7 care at home--which pt prefers)    Financial risk analyst (lightweight, removable armrests, elevating legrests); Wheelchair cushion (basic)    Recommendations for Other Services       Precautions / Restrictions Precautions Precautions: Fall Precaution Comments: reports multiple falls at home; recently when not holding onto RW or missed sitting on EOB Required Braces or Orthoses: Sling Restrictions Weight Bearing Restrictions: Yes LUE Weight Bearing:  (no orders, maintained NWB) Other Position/Activity Restrictions: sling on LUE      Mobility  Bed Mobility Overal bed mobility: Needs Assistance Bed Mobility: Supine to Sit     Supine to  sit: Min guard;HOB elevated     General bed mobility comments: exit to rt with incr effort and time (nearly needed assist)  Transfers Overall transfer level: Needs assistance Equipment used: 1 person hand held assist Transfers: Sit to/from Stand Sit to Stand: Min assist         General transfer comment: from EOB and standard toilet with grab bar on rt  Ambulation/Gait Ambulation/Gait assistance: Min assist Ambulation Distance (Feet): 15 Feet (x2) Assistive device: 1 person hand held assist Gait Pattern/deviations: Step-through pattern;Decreased stride length;Steppage;Wide base of support (posterior bias)   Gait velocity interpretation: Below normal speed for age/gender General Gait Details: reports gait deficits due to neuropathy; Rt HHa and stabilizing with gait belt  Stairs            Wheelchair Mobility    Modified Rankin (Stroke Patients Only)       Balance Overall balance assessment: History of Falls;Needs assistance         Standing balance support: Single extremity supported Standing balance-Leahy Scale: Poor Standing balance comment: legs began to shake while attempting to stand and urinate at toilet; pt agreed to sit for safety                             Pertinent Vitals/Pain Pain Assessment: Faces Faces Pain Scale: Hurts a little bit Pain Location: lt shoulder Pain Descriptors / Indicators: Guarding Pain Intervention(s): Limited activity within patient's tolerance;Premedicated before session;Repositioned    Home Living Family/patient expects to be discharged to:: Private residence Living Arrangements: Alone Available Help at Discharge: Available 24 hours/day (through next week) Type of Home: House Home Access: Stairs to  enter Entrance Stairs-Rails: Left;Right Entrance Stairs-Number of Steps: 2 Home Layout: Two level;Able to live on main level with bedroom/bathroom Home Equipment: Gilford Rile - 2 wheels;Cane - single point;Wheelchair  - manual;Grab bars - toilet;Toilet riser;Grab bars - tub/shower;Shower seat;Hospital bed      Prior Function Level of Independence: Independent with assistive device(s)         Comments: Kasandra Knudsen for community mobility, RW for mobility inside of house. Independent with ADL.     Hand Dominance   Dominant Hand: Right    Extremity/Trunk Assessment   Upper Extremity Assessment Upper Extremity Assessment: Defer to OT evaluation    Lower Extremity Assessment Lower Extremity Assessment: RLE deficits/detail;LLE deficits/detail RLE Deficits / Details: strength grossly 4-5/5;  RLE Sensation: history of peripheral neuropathy LLE Deficits / Details: strength grossly 4-5/5;  LLE Sensation: history of peripheral neuropathy    Cervical / Trunk Assessment Cervical / Trunk Assessment: Kyphotic  Communication   Communication: HOH  Cognition Arousal/Alertness: Awake/alert Behavior During Therapy: WFL for tasks assessed/performed Overall Cognitive Status:  (repeats himself at times)                      General Comments General comments (skin integrity, edema, etc.): Daughter? arrived at end of session    Exercises     Assessment/Plan    PT Assessment All further PT needs can be met in the next venue of care  PT Problem List Decreased strength;Decreased balance;Decreased mobility;Decreased cognition;Decreased knowledge of use of DME;Decreased safety awareness;Decreased knowledge of precautions;Impaired sensation          PT Treatment Interventions      PT Goals (Current goals can be found in the Care Plan section)  Acute Rehab PT Goals Patient Stated Goal: return to walking with walker when able  PT Goal Formulation: All assessment and education complete, DC therapy    Frequency     Barriers to discharge        Co-evaluation               End of Session Equipment Utilized During Treatment: Gait belt Activity Tolerance: Patient tolerated treatment  well Patient left: in chair;with call bell/phone within reach;with chair alarm set;with family/visitor present      Functional Assessment Tool Used: clinical judgement Functional Limitation: Mobility: Walking and moving around Mobility: Walking and Moving Around Current Status ([id number]): At least 20 percent but less than 40 percent impaired, limited or restricted Mobility: Walking and Moving Around Goal Status [phone]): At least 20 percent but less than 40 percent impaired, limited or restricted Mobility: Walking and Moving Around Discharge Status [phone]): At least 20 percent but less than 40 percent impaired, limited or restricted    Time: 1151-1223 PT Time Calculation (min) (ACUTE ONLY): 32 min   Charges:   PT Evaluation $PT Eval High Complexity: 1 Procedure     PT G Codes:   PT G-Codes **NOT FOR INPATIENT CLASS** Functional Assessment Tool Used: clinical judgement Functional Limitation: Mobility: Walking and moving around Mobility: Walking and Moving Around Current Status ([id number]): At least 20 percent but less than 40 percent impaired, limited or restricted Mobility: Walking and Moving Around Goal Status [phone]): At least 20 percent but less than 40 percent impaired, limited or restricted Mobility: Walking and Moving Around Discharge Status [phone]): At least 20 percent but less than 40 percent impaired, limited or restricted    Rexanne Mano 03/10/2016, 12:44 PM Pager [phone]

## 2016-03-10 NOTE — ED Triage Notes (Signed)
Per EMS: Pt fell and landed on arm. Pt has obvious deformity to left arm. PMS intact. No LOC, no neuro deficits, pt did not hit his head. Pt received 50 mcg of fentanyl intranasally. Pt received 50 mcg of IV fentanyl about 0046. Pt was on coumadin, but was taken off 4 days ago. Pt has multiple skin tears.

## 2016-03-10 NOTE — Progress Notes (Signed)
Patient admitted after midnight, please see H&P.  [name] was admitted after a fall.  ER found a dislocated shoulder and this was reduced.  Patient with marked swelling of his left shoulder.  Will order ice/pain meds and PT eval.  -? Need for SNF-- care management consult  [name]

## 2016-03-10 NOTE — H&P (Signed)
History and Physical    [name] DOB: [birth year] DOA: 03/10/2016   PCP: [name], MD Chief Complaint:  Chief Complaint  Patient presents with  . Fall    HPI: [name] is a 82 y.o. male with medical history significant of PAF, got taken off of blood thinners some 10 days ago due to frequent falls.  Presenting to ED after a fall at home.  He normally walks with a walker at baseline.  He fell on to his L arm.  Subsequent L shoulder deformity, severe pain when he tried to move it, better at rest.  Symptoms are persistent and constant since onset.  ED Course: L shoulder dislocation.  EDP performed reduction procedure which was successful, but was complicated by a run of torsades de pointes when they initially pushed the propofol on the patient.  This was self limited and terminated very rapidly without any intervention, and patient is obviously back to baseline mental status.  Review of Systems: As per HPI otherwise 10 point review of systems negative.    Past Medical History:  Diagnosis Date  . Adenocarcinoma of right lung, stage 1 ([place]) 12/11/2014  . Arthritis    osteoarthritis  . Asthma   . Atrial fibrillation ([place])   . Bowel obstruction   . Bradycardia   . Cancer ([place])    skin cancer  . COPD (chronic obstructive pulmonary disease) (H[facility])    mild  . Diverticulosis of colon   . Hemorrhoids   . Hx of adenomatous colonic polyps 02/1997  . Hyperlipidemia   . Hypertension   . 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[place])   . Palpitations   . Pleural effusion     Past Surgical History:  Procedure Laterality Date  . APPENDECTOMY    . COLON RESECTION    . CYSTOSCOPY    . LAMINECTOMY     Right L4-L5 Laminectomy  . ROTATOR CUFF REPAIR Right   . TOTAL HIP ARTHROPLASTY Right   . UMBILICAL HERNIA REPAIR    . VIDEO BRONCHOSCOPY WITH ENDOBRONCHIAL NAVIGATION N/A 12/18/2014   Procedure: VIDEO BRONCHOSCOPY WITH ENDOBRONCHIAL NAVIGATION ;  Surgeon: Collene Gobble, MD;   Location: [facility];  Service: Thoracic;  Laterality: N/A;     reports that he quit smoking about 59 years ago. His smoking use included Cigarettes. He smoked 0.00 packs per day for 13.00 years. He has never used smokeless tobacco. He reports that he drinks alcohol. He reports that he does not use drugs.  Allergies  Allergen Reactions  . Sulfa Antibiotics Other (See Comments)    Unsure of reaction  . Sulfonamide Derivatives Other (See Comments)    Unk    Family History  Problem Relation Age of Onset  . Stroke Mother 78  . Lung cancer Father 64  . Leukemia Brother   . Alzheimer's disease Sister   . Autoimmune disease Brother     Polyarteritis nodosa      Prior to Admission medications   Medication Sig Start Date End Date Taking? Authorizing Provider  albuterol (PROVENTIL HFA;VENTOLIN HFA) 108 (90 Base) MCG/ACT inhaler Inhale 1-2 puffs into the lungs every 6 (six) hours as needed for wheezing or shortness of breath.   Yes Historical Provider, MD  alendronate (FOSAMAX) 70 MG tablet Take 70 mg by mouth once a week. Take with a full glass of water on an empty stomach.   Yes Historical Provider, MD  CALCIUM PO Take 1,000 mg by mouth daily.   Yes Historical Provider,  MD  cetirizine (ZYRTEC) 10 MG tablet Take 10 mg by mouth every evening.    Yes Historical Provider, MD  Cholecalciferol (VITAMIN D PO) Take 3 capsules by mouth daily.   Yes Historical Provider, MD  dicyclomine (BENTYL) 10 MG capsule Take 1 capsule (10 mg total) by mouth 4 (four) times daily -  before meals and at bedtime. 03/08/16  Yes Ladene Artist, MD  finasteride (PROSCAR) 5 MG tablet Take 10 mg by mouth daily.    Yes Historical Provider, MD  fluticasone (FLONASE) 50 MCG/ACT nasal spray Place 2 sprays into both nostrils every morning.   Yes Historical Provider, MD  Fluticasone-Salmeterol (ADVAIR) 250-50 MCG/DOSE AEPB Inhale 1 puff into the lungs 2 (two) times daily.   Yes Historical Provider, MD  montelukast (SINGULAIR) 10  MG tablet Take 10 mg by mouth at bedtime.   Yes Historical Provider, MD  predniSONE (DELTASONE) 10 MG tablet Take 10 mg by mouth daily. 09/23/15  Yes Historical Provider, MD  Probiotic Product (PROBIOTIC PO) Take 1 capsule by mouth daily.   Yes Historical Provider, MD  temazepam (RESTORIL) 15 MG capsule Take 15-30 mg by mouth at bedtime as needed for sleep.    Yes Historical Provider, MD  Tiotropium Bromide Monohydrate (SPIRIVA RESPIMAT) 2.5 MCG/ACT AERS Inhale 2 puffs into the lungs daily.   Yes Historical Provider, MD  verapamil (COVERA HS) 180 MG (CO) 24 hr tablet Take 1 tablet (180 mg total) by mouth at bedtime. Patient taking differently: Take 180 mg by mouth every evening.  01/16/13  Yes Evans Lance, MD  zolpidem (AMBIEN) 10 MG tablet Take 5-10 mg by mouth at bedtime.    Yes Historical Provider, MD    Physical Exam: Vitals:   03/10/16 0430 03/10/16 0445 03/10/16 0500 03/10/16 0515  BP: 116/66 128/64 117/72 122/60  Pulse: 77 80 73 69  Resp: '13 21 14 14  '$ Temp:      TempSrc:      SpO2: 100% 96% 100% 99%      Constitutional: NAD, calm, comfortable Eyes: PERRL, lids and conjunctivae normal ENMT: Mucous membranes are moist. Posterior pharynx clear of any exudate or lesions.Normal dentition.  Neck: normal, supple, no masses, no thyromegaly Respiratory: clear to auscultation bilaterally, no wheezing, no crackles. Normal respiratory effort. No accessory muscle use.  Cardiovascular: Regular rate and rhythm, no murmurs / rubs / gallops. No extremity edema. 2+ pedal pulses. No carotid bruits.  Abdomen: no tenderness, no masses palpated. No hepatosplenomegaly. Bowel sounds positive.  Musculoskeletal: no clubbing / cyanosis. No joint deformity upper and lower extremities. Good ROM, no contractures. Normal muscle tone.  Skin: no rashes, lesions, ulcers. No induration Neurologic: CN 2-12 grossly intact. Sensation intact, DTR normal. Strength 5/5 in all 4.  Psychiatric: Normal judgment and  insight. Alert and oriented x 3. Normal mood.    Labs on Admission: I have personally reviewed following labs and imaging studies  CBC:  Recent Labs Lab 03/08/16 1012 03/10/16 0349  WBC 8.0 8.6  NEUTROABS 6.0 6.8  HGB 12.4* 10.3*  HCT 37.3* 31.4*  MCV 99.2 99.7  PLT 181.0 570*   Basic Metabolic Panel:  Recent Labs Lab 03/08/16 1012 03/10/16 0349  NA 136 138  K 4.7 3.8  CL 98 104  CO2 30 24  GLUCOSE 104* 93  BUN 20 20  CREATININE 1.00 1.00  CALCIUM 9.7 8.9   GFR: Estimated Creatinine Clearance: 49.3 mL/min (by C-G formula based on SCr of 1 mg/dL). Liver Function Tests:  Recent Labs Lab 03/08/16 1012  AST 20  ALT 16  ALKPHOS 57  BILITOT 0.5  PROT 6.6  ALBUMIN 4.0   No results for input(s): LIPASE, AMYLASE in the last 168 hours. No results for input(s): AMMONIA in the last 168 hours. Coagulation Profile:  Recent Labs Lab 03/10/16 0349  INR 1.16   Cardiac Enzymes: No results for input(s): CKTOTAL, CKMB, CKMBINDEX, TROPONINI in the last 168 hours. BNP (last 3 results) No results for input(s): PROBNP in the last 8760 hours. HbA1C: No results for input(s): HGBA1C in the last 72 hours. CBG: No results for input(s): GLUCAP in the last 168 hours. Lipid Profile: No results for input(s): CHOL, HDL, LDLCALC, TRIG, CHOLHDL, LDLDIRECT in the last 72 hours. Thyroid Function Tests:  Recent Labs  03/08/16 1012  TSH 2.97   Anemia Panel:  Recent Labs  03/09/16 0819  VITAMINB12 242  FOLATE 8.2  FERRITIN 43.1  IRON 52   Urine analysis:    Component Value Date/Time   COLORURINE YELLOW 07/20/2007 0227   APPEARANCEUR CLEAR 07/20/2007 0227   LABSPEC 1.020 08/25/2009 1739   PHURINE 5.5 08/25/2009 1739   GLUCOSEU NEGATIVE 08/25/2009 1739   HGBUR NEGATIVE 08/25/2009 1739   BILIRUBINUR NEGATIVE 08/25/2009 1739   KETONESUR NEGATIVE 08/25/2009 1739   PROTEINUR NEGATIVE 08/25/2009 1739   UROBILINOGEN 0.2 08/25/2009 1739   NITRITE NEGATIVE 08/25/2009 1739    LEUKOCYTESUR  08/25/2009 1739    NEGATIVE Biochemical Testing Only. Please order routine urinalysis from main lab if confirmatory testing is needed.   Sepsis Labs: '@LABRCNTIP'$ (procalcitonin:4,lacticidven:4) )No results found for this or any previous visit (from the past 240 hour(s)).   Radiological Exams on Admission: Dg Shoulder Left  Result Date: 03/10/2016 CLINICAL DATA:  Status post fall on outstretched arm EXAM: LEFT SHOULDER - 2+ VIEW COMPARISON:  None. FINDINGS: There is anterior dislocation of the left glenohumeral joint. Of the acromioclavicular joint is normal. No acute fracture is identified. IMPRESSION: Anterior dislocation of the left glenohumeral joint. Electronically Signed   By: Ulyses Jarred M.D.   On: 03/10/2016 02:50   Dg Shoulder Left Port  Result Date: 03/10/2016 CLINICAL DATA:  82 y/o  M; post reduction. EXAM: LEFT SHOULDER - 1 VIEW COMPARISON:  03/10/2016 shoulder radiographs. FINDINGS: Interval reduction of the shoulder dislocation with the humeral head now seated on the glenoid. Loss of the superior acromiohumeral interval indicating probable underlying rotator cuff injury. No acute fracture is identified. IMPRESSION: Interval reduction of the shoulder dislocation with the humeral head now seated on the glenoid. Loss of the superior acromiohumeral interval indicating probable underlying rotator cuff injury. No acute fracture is identified. Electronically Signed   By: Kristine Garbe M.D.   On: 03/10/2016 03:32    EKG: Independently reviewed.  Assessment/Plan Principal Problem:   Shoulder dislocation, left, initial encounter Active Problems:   Essential hypertension   PAF (paroxysmal atrial fibrillation) (HCC)   Torsades de pointes ([place])    1. L shoulder dislocation - 1. Reduced 2. But patient likely wont be able to ambulate with walker for a while 3. Will admit to obs 4. PT/OT 5. SW / CM 6. See if we can get him into rehab / NH / etc though I  did discuss my concern with patient regarding the whole 3 day qualifying stay thing and that this was an obs visit. 2. HTN - continue home BP meds 3. PAF - continue rate control, off of blood thinners due to frequent falls 4. Torsades - 1. Tele  monitor 2. Likely due to propofol push, rapid self resolution 3. Unlikely to re-occur 4. Patient currently asymptomatic and back to baseline   DVT prophylaxis: SCDs due to recent trauma Code Status: Full Family Communication: Family at bedside Consults called: None Admission status: Admit to obs   Etta Quill DO [facility] Pager [phone] from 7PM-7AM  If 7AM-7PM, please contact the day physician for the patient [email or web address] Password [facility]  03/10/2016, 5:26 AM

## 2016-03-10 NOTE — Discharge Summary (Addendum)
Physician Discharge Summary  [name] IDP:[id number] DOB: June 19, 1924 DOA: 03/10/2016  PCP: [name] Redwood, MD  Admit date: 03/10/2016 Discharge date: 03/10/2016   Recommendations for Outpatient Follow-Up:   1. Home health PT 2. 24 hour supervision x 2 weeks 3. CBC  Week 4. Ice shoulder   Discharge Diagnosis:   Principal Problem:   Shoulder dislocation, left, initial encounter Active Problems:   Essential hypertension   PAF (paroxysmal atrial fibrillation) (HCC)   Torsades de pointes [facility])   Discharge disposition:  Home.   Discharge Condition: Improved.  Diet recommendation: Low sodium, heart healthy.  Wound care: None.   History of Present Illness:   [name] is a 82 y.o. male with medical history significant of PAF, got taken off of blood thinners some 10 days ago due to frequent falls.  Presenting to ED after a fall at home.  He normally walks with a walker at baseline.  He fell on to his L arm.  Subsequent L shoulder deformity, severe pain when he tried to move it, better at rest.  Symptoms are persistent and constant since onset.   Hospital Course by Problem:   1. L shoulder dislocation - 1. Reduced- but probably has rotator cuff damage.  ? Ortho consult outpatient, watch CBC and swelling 2. PT/OT- 24 hour supervision, home health (discussed staying overnight but daughter has 24 hour care set up and does not have tomm off so prefers to take home today) 2. HTN - continue home BP meds 3. PAF - continue rate control, off of blood thinners due to frequent falls 4. Torsades - 1. Likely due to propofol push, rapid self resolution 2. Unlikely to re-occur 3. Patient currently asymptomatic and back to baseline     Medical Consultants:    None.   Discharge Exam:   Vitals:   03/10/16 0614 03/10/16 1235  BP: 127/66 (!) 123/52  Pulse: 83 85  Resp: 15 17  Temp: 98 F (36.7 C) 97.8 F (36.6 C)   Vitals:   03/10/16 0515 03/10/16 0530 03/10/16  0614 03/10/16 1235  BP: 122/60 128/68 127/66 (!) 123/52  Pulse: 69 75 83 85  Resp: '14 15 15 17  '$ Temp:   98 F (36.7 C) 97.8 F (36.6 C)  TempSrc:   Oral Oral  SpO2: 99% 100% 94% 95%  Weight:   78.8 kg (173 lb 12.8 oz)   Height:   '5\' 8"'$  (1.727 m)     Gen:  NAD    The results of significant diagnostics from this hospitalization (including imaging, microbiology, ancillary and laboratory) are listed below for reference.     Procedures and Diagnostic Studies:   Dg Shoulder Left  Result Date: 03/10/2016 CLINICAL DATA:  Status post fall on outstretched arm EXAM: LEFT SHOULDER - 2+ VIEW COMPARISON:  None. FINDINGS: There is anterior dislocation of the left glenohumeral joint. Of the acromioclavicular joint is normal. No acute fracture is identified. IMPRESSION: Anterior dislocation of the left glenohumeral joint. Electronically Signed   By: Ulyses Jarred M.D.   On: 03/10/2016 02:50   Dg Shoulder Left Port  Result Date: 03/10/2016 CLINICAL DATA:  82 y/o  M; post reduction. EXAM: LEFT SHOULDER - 1 VIEW COMPARISON:  03/10/2016 shoulder radiographs. FINDINGS: Interval reduction of the shoulder dislocation with the humeral head now seated on the glenoid. Loss of the superior acromiohumeral interval indicating probable underlying rotator cuff injury. No acute fracture is identified. IMPRESSION: Interval reduction of the shoulder dislocation with the humeral  head now seated on the glenoid. Loss of the superior acromiohumeral interval indicating probable underlying rotator cuff injury. No acute fracture is identified. Electronically Signed   By: Kristine Garbe M.D.   On: 03/10/2016 03:32     Labs:   Basic Metabolic Panel:  Recent Labs Lab 03/08/16 1012 03/10/16 0349  NA 136 138  K 4.7 3.8  CL 98 104  CO2 30 24  GLUCOSE 104* 93  BUN 20 20  CREATININE 1.00 1.00  CALCIUM 9.7 8.9   GFR Estimated Creatinine Clearance: 48.5 mL/min (by C-G formula based on SCr of 1  mg/dL). Liver Function Tests:  Recent Labs Lab 03/08/16 1012  AST 20  ALT 16  ALKPHOS 57  BILITOT 0.5  PROT 6.6  ALBUMIN 4.0   No results for input(s): LIPASE, AMYLASE in the last 168 hours. No results for input(s): AMMONIA in the last 168 hours. Coagulation profile  Recent Labs Lab 03/10/16 0349  INR 1.16    CBC:  Recent Labs Lab 03/08/16 1012 03/10/16 0349  WBC 8.0 8.6  NEUTROABS 6.0 6.8  HGB 12.4* 10.3*  HCT 37.3* 31.4*  MCV 99.2 99.7  PLT 181.0 141*   Cardiac Enzymes: No results for input(s): CKTOTAL, CKMB, CKMBINDEX, TROPONINI in the last 168 hours. BNP: Invalid input(s): POCBNP CBG: No results for input(s): GLUCAP in the last 168 hours. D-Dimer No results for input(s): DDIMER in the last 72 hours. Hgb A1c No results for input(s): HGBA1C in the last 72 hours. Lipid Profile No results for input(s): CHOL, HDL, LDLCALC, TRIG, CHOLHDL, LDLDIRECT in the last 72 hours. Thyroid function studies  Recent Labs  03/08/16 1012  TSH 2.97   Anemia work up  Recent Labs  03/09/16 0819  VITAMINB12 242  FOLATE 8.2  FERRITIN 43.1  IRON 52   Microbiology No results found for this or any previous visit (from the past 240 hour(s)).   Discharge Instructions:   Discharge Instructions    Diet - low sodium heart healthy    Complete by:  As directed    Discharge instructions    Complete by:  As directed    Would avoid using ambien and pain medications at same time Ice shoulder-- if swelling worsens, would return to ER  X ray suggestive of rotator cuff damage-- outpatient ortho follow up Home health and 24 hour supervision   Increase activity slowly    Complete by:  As directed        Medication List    STOP taking these medications   AMBIEN 10 MG tablet Generic drug:  zolpidem     TAKE these medications   albuterol 108 (90 Base) MCG/ACT inhaler Commonly known as:  PROVENTIL HFA;VENTOLIN HFA Inhale 1-2 puffs into the lungs every 6 (six) hours as  needed for wheezing or shortness of breath.   alendronate 70 MG tablet Commonly known as:  FOSAMAX Take 70 mg by mouth once a week. Take with a full glass of water on an empty stomach.   CALCIUM PO Take 1,000 mg by mouth daily.   cetirizine 10 MG tablet Commonly known as:  ZYRTEC Take 10 mg by mouth every evening.   dicyclomine 10 MG capsule Commonly known as:  BENTYL Take 1 capsule (10 mg total) by mouth 4 (four) times daily -  before meals and at bedtime.   finasteride 5 MG tablet Commonly known as:  PROSCAR Take 10 mg by mouth daily.   fluticasone 50 MCG/ACT nasal spray Commonly known as:  FLONASE Place 2 sprays into both nostrils every morning.   Fluticasone-Salmeterol 250-50 MCG/DOSE Aepb Commonly known as:  ADVAIR Inhale 1 puff into the lungs 2 (two) times daily.   HYDROcodone-acetaminophen 5-325 MG tablet Commonly known as:  NORCO/VICODIN Take 1-2 tablets by mouth every 4 (four) hours as needed for moderate pain.   montelukast 10 MG tablet Commonly known as:  SINGULAIR Take 10 mg by mouth at bedtime.   predniSONE 10 MG tablet Commonly known as:  DELTASONE Take 10 mg by mouth daily.   PROBIOTIC PO Take 1 capsule by mouth daily.   SPIRIVA RESPIMAT 2.5 MCG/ACT Aers Generic drug:  Tiotropium Bromide Monohydrate Inhale 2 puffs into the lungs daily.   temazepam 15 MG capsule Commonly known as:  RESTORIL Take 15-30 mg by mouth at bedtime as needed for sleep.   verapamil 180 MG (CO) 24 hr tablet Commonly known as:  COVERA HS Take 1 tablet (180 mg total) by mouth at bedtime. What changed:  when to take this   VITAMIN D PO Take 3 capsules by mouth daily.            Durable Medical Equipment        Start     Ordered   03/10/16 1127  For home use only DME lightweight manual wheelchair with seat cushion  Once    Comments:  Patient suffers from dislocated shoulder which impairs their ability to perform daily activities like dressing in the home.  A cane  will not resolve  issue with performing activities of daily living. A wheelchair will allow patient to safely perform daily activities. Patient is not able to propel themselves in the home using a standard weight wheelchair due to general weakness. Patient can self propel in the lightweight wheelchair.  Accessories: elevating leg rests (ELRs), wheel locks, extensions and anti-tippers.   03/10/16 1126     Follow-up Information    [facility] Follow up.   Specialty:  [place] Why:  HHPT/OT arranged (formerly [place]) they will call you to set up home visits Contact information: [address] [place] [address] [phone]        Inc[facility] - [facility] Follow up.   Why:  wheelchair arranged- to be delivered to room prior to discharge. Contact information: [place] [address] [phone]        [name] Redwood, MD Follow up in 1 week(s).   Specialty:  Internal Medicine Why:  cbc Contact information: [address] [place] [address] [phone]            Time coordinating discharge: 35 min  Signed:  Ger Nicks Alison Stalling   [facility] 03/10/2016, 1:15 PM

## 2016-03-10 NOTE — Evaluation (Signed)
Occupational Therapy Evaluation Patient Details Name: [name] MRN: [id number] DOB: 02/10/1925 Today's Date: 03/10/2016    History of Present Illness Pt is an 82 y.o. male s/p fall on LUE at home. Pt found to have L shoulder dislocation that was reduced in the ED. PMHx: Arthritis, Asthma, A-fib, Bradycardia, Cancer, COPD, Hyperlipidemia, HTN, Lofflers syndrome, Pleural effusion.    Clinical Impression   Pt reports he was independent with ADL PTA. Currently pt overall min assist for ADL and functional mobility. Pt presenting with poor balance in standing, ?short term memory deficits, LUE NWB/decreased ROM and strength, and hx of falls at home impacting his independence and safety with ADL and functional mobility. Pt planning to d/c home with 24/7 assist. Recommending HHOT for follow up to maximize independence and safety with ADL and functional mobility upon return home. Pt would benefit from continued skilled OT to address established goals.    Follow Up Recommendations  Home health OT;Supervision/Assistance - 24 hour    Equipment Recommendations  None recommended by OT    Recommendations for Other Services       Precautions / Restrictions Precautions Precautions: Fall Precaution Comments: reports multiple falls at home; recently when not holding onto RW or missed sitting on EOB Required Braces or Orthoses: Sling Restrictions Weight Bearing Restrictions: Yes LUE Weight Bearing:  (no orders, maintained NWB) Other Position/Activity Restrictions: sling on LUE      Mobility Bed Mobility Overal bed mobility: Needs Assistance Bed Mobility: Supine to Sit     Supine to sit: Min guard;HOB elevated     General bed mobility comments: exit to rt with incr effort and time (nearly needed assist)  Transfers Overall transfer level: Needs assistance Equipment used: 1 person hand held assist Transfers: Sit to/from Stand Sit to Stand: Min assist         General transfer  comment: from EOB and standard toilet with grab bar on rt    Balance Overall balance assessment: History of Falls;Needs assistance         Standing balance support: Single extremity supported Standing balance-Leahy Scale: Poor Standing balance comment: legs began to shake while attempting to stand and urinate at toilet; pt agreed to sit for safety                            ADL Overall ADL's : Needs assistance/impaired Eating/Feeding: Set up;Sitting   Grooming: Minimal assistance;Standing;Wash/dry hands Grooming Details (indicate cue type and reason): Min assist for standing balance Upper Body Bathing: Minimal assistance;Sitting   Lower Body Bathing: Minimal assistance;Sit to/from stand   Upper Body Dressing : Minimal assistance;Sitting   Lower Body Dressing: Minimal assistance;Sit to/from stand   Toilet Transfer: Minimal assistance;Ambulation;Regular Toilet   Toileting- Clothing Manipulation and Hygiene: Minimal assistance;Sit to/from stand       Functional mobility during ADLs: Minimal assistance (hand held assist) General ADL Comments: Educated on UB bathing/dressing technique, no WB through LUE, bed mobility technique. Discussed need for 24/7 supervision with pt and daughter; they are agreeable.     Vision     Perception     Praxis      Pertinent Vitals/Pain Pain Assessment: Faces Faces Pain Scale: Hurts a little bit Pain Location: lt shoulder Pain Descriptors / Indicators: Guarding Pain Intervention(s): Limited activity within patient's tolerance;Monitored during session;Repositioned     Hand Dominance Right   Extremity/Trunk Assessment Upper Extremity Assessment Upper Extremity Assessment: LUE deficits/detail LUE Deficits / Details:  Pt with digit and wrist AROM. Limited elbow AROM due to pain LUE: Unable to fully assess due to immobilization;Unable to fully assess due to pain   Lower Extremity Assessment Lower Extremity Assessment: Defer  to PT evaluation    Cervical / Trunk Assessment Cervical / Trunk Assessment: Kyphotic   Communication Communication Communication: HOH   Cognition Arousal/Alertness: Awake/alert Behavior During Therapy: WFL for tasks assessed/performed Overall Cognitive Status:  (repeats himself at times)                     General Comments       Exercises       Shoulder Instructions      Home Living Family/patient expects to be discharged to:: Private residence Living Arrangements: Alone Available Help at Discharge: Available 24 hours/day (through next week) Type of Home: House Home Access: Stairs to enter [employer] of Steps: 2 Entrance Stairs-Rails: Left;Right Home Layout: Two level;Able to live on main level with bedroom/bathroom     Bathroom Shower/Tub: Occupational psychologist: Standard Bathroom Accessibility: Yes How Accessible: Accessible via walker Home Equipment: [place] - 2 wheels;Cane - single point;Wheelchair - manual;Grab bars - toilet;Toilet riser;Grab bars - tub/shower;Shower seat;Hospital bed          Prior Functioning/Environment Level of Independence: Independent with assistive device(s)        Comments: Cane for community mobility, RW for mobility inside of house. Independent with ADL.        OT Problem List: Decreased strength;Decreased range of motion;Decreased activity tolerance;Impaired balance (sitting and/or standing);Decreased safety awareness;Decreased knowledge of use of DME or AE;Decreased knowledge of precautions;Impaired UE functional use;Increased edema;Pain   OT Treatment/Interventions: Self-care/ADL training;Energy conservation;DME and/or AE instruction;Therapeutic activities;Patient/family education;Balance training    OT Goals(Current goals can be found in the care plan section) Acute Rehab OT Goals Patient Stated Goal: return to walking with walker when able  OT Goal Formulation: With patient Time For Goal  Achievement: 03/24/16 Potential to Achieve Goals: Good ADL Goals Pt Will Perform Upper Body Bathing: with set-up;sitting Pt Will Perform Upper Body Dressing: with set-up;sitting Additional ADL Goal #1: Pt will don/doff sling with set up as precursor to ADL and functional mobility. Additional ADL Goal #2: Pt will perform bed mobility at a supervision level while maintaining NWB on LUE.  OT Frequency: Min 2X/week   Barriers to D/C:            Co-evaluation PT/OT/SLP Co-Evaluation/Treatment: Yes Reason for Co-Treatment: For patient/therapist safety          End of Session Equipment Utilized During Treatment: Gait belt;Other (comment) (sling)  Activity Tolerance: Patient tolerated treatment well Patient left: in chair;with call bell/phone within reach;with chair alarm set;with family/visitor present   Time: 8341-9622 OT Time Calculation (min): 31 min Charges:  OT General Charges $OT Visit: 1 Procedure OT Evaluation $OT Eval Moderate Complexity: 1 Procedure G-Codes: OT G-codes **NOT FOR INPATIENT CLASS** Functional Assessment Tool Used: Clinical judgement Functional Limitation: Self care Self Care Current Status (W9798): At least 20 percent but less than 40 percent impaired, limited or restricted Self Care Goal Status (X2119): At least 1 percent but less than 20 percent impaired, limited or restricted   Binnie Kand M.S., OTR/L Pager: [phone]  03/10/2016, 2:09 PM

## 2016-03-10 NOTE — ED Provider Notes (Signed)
[facility] DEPT Provider Note   CSN: [id number] Arrival date & time: 03/10/16  1540  By signing my name below, I, Reola Mosher, attest that this documentation has been prepared under the direction and in the presence of Charlesetta Shanks, MD. Electronically Signed: Reola Mosher, ED Scribe. 03/10/16. 2:02 AM.  History   Chief Complaint Chief Complaint  Patient presents with  . Fall   The history is provided by the patient and the EMS personnel. No language interpreter was used.   HPI Comments: [name] is a 82 y.o. male BIB EMS, with a PMHx of HTN and HLD, who presents to the Emergency Department s/p ground-level, mechanical fall which occurred just prior to arrival. Pt reports that he was ambulating towards his kitchen tonight when his right knee gave out, causing him to fall forwards. He notes that during his fall he fell with his left arm outstretched, sustaining immediate pain to the left arm. No LOC or head injury. He denies any other areas of pain or known injury. On their examination, EMS noted an obvious deformity to the left arm with several surrounding skin tears. He received 78mg intranasally and 581m via IV access in the right arm prior to their arrival in the ED (last dose 0046) with mild relief of his pain, but he notes that his pain is now gradually worsening. EMS additionally placed a left arm immobilizer onto the pt prior to their arrival as well. His pain is exacerbated with attempted movement of the left arm. Pt was previously anticoagulated on Eliquis for his h/o DVT and PAF; however, he was taken off of this medication ~10 days ago by his PCP. He states that he was taken off of this medication secondary to frequent falls. He denies numbness, or any other associated symptoms.   Past Medical History:  Diagnosis Date  . Adenocarcinoma of right lung, stage 1 (HCStruble9/14/2016  . Arthritis    osteoarthritis  . Asthma   . Atrial fibrillation (HCL[place]  .  Bowel obstruction   . Bradycardia   . Cancer (HCHelmetta   skin cancer  . COPD (chronic obstructive pulmonary disease) (HCC)    mild  . Diverticulosis of colon   . Hemorrhoids   . Hx of adenomatous colonic polyps 02/1997  . Hyperlipidemia   . Hypertension   . 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HCLebanon  . Palpitations   . Pleural effusion    Patient Active Problem List   Diagnosis Date Noted  . Troponin I above reference range 03/10/2016  . Right elbow pain 12/02/2015  . Cellulitis 10/15/2015  . Pleural effusion 01/23/2015  . Acute respiratory failure with hypoxia (HCBergman10/01/2015  . PAF (paroxysmal atrial fibrillation) (HCEl Refug[place]10/01/2015  . Acute respiratory failure (HCBoiling Springs10/01/2015  . Shortness of breath 01/06/2015  . Patellar tendinitis of right knee 12/12/2014  . Adenocarcinoma of right lung, stage 1 (HCOak Ridge09/14/2016  . Traumatic rotator cuff tear 11/07/2014  . Compression fracture of L1 lumbar vertebra (HCC) 11/07/2014  . Lumbar degenerative disc disease 09/11/2014  . Osteoarthritis of right knee 09/11/2014  . Chronic anticoagulation 11/22/2012  . DYSLIPIDEMIA 08/06/2008  . COLONIC POLYPS, ADENOMATOUS 07/17/2008  . Essential hypertension 07/17/2008  . Atrial fibrillation (HCLaverne04/21/2010  . BRADYCARDIA 07/17/2008  . HEMORRHOIDS 07/17/2008  . Asthma 07/17/2008  . DIVERTICULOSIS, COLON 07/17/2008  . PALPITATIONS 07/17/2008   Past Surgical History:  Procedure Laterality Date  . APPENDECTOMY    . COLON RESECTION    . CYSTOSCOPY    .  LAMINECTOMY     Right L4-L5 Laminectomy  . ROTATOR CUFF REPAIR Right   . TOTAL HIP ARTHROPLASTY Right   . UMBILICAL HERNIA REPAIR    . VIDEO BRONCHOSCOPY WITH ENDOBRONCHIAL NAVIGATION N/A 12/18/2014   Procedure: VIDEO BRONCHOSCOPY WITH ENDOBRONCHIAL NAVIGATION ;  Surgeon: Collene Gobble, MD;  Location: [facility];  Service: Thoracic;  Laterality: N/A;    Home Medications    Prior to Admission medications   Medication Sig Start Date End Date Taking?  Authorizing Provider  albuterol (PROVENTIL HFA;VENTOLIN HFA) 108 (90 Base) MCG/ACT inhaler Inhale 1-2 puffs into the lungs every 6 (six) hours as needed for wheezing or shortness of breath.   Yes Historical Provider, MD  alendronate (FOSAMAX) 70 MG tablet Take 70 mg by mouth once a week. Take with a full glass of water on an empty stomach.   Yes Historical Provider, MD  CALCIUM PO Take 1,000 mg by mouth daily.   Yes Historical Provider, MD  cetirizine (ZYRTEC) 10 MG tablet Take 10 mg by mouth every evening.    Yes Historical Provider, MD  Cholecalciferol (VITAMIN D PO) Take 3 capsules by mouth daily.   Yes Historical Provider, MD  dicyclomine (BENTYL) 10 MG capsule Take 1 capsule (10 mg total) by mouth 4 (four) times daily -  before meals and at bedtime. 03/08/16  Yes Ladene Artist, MD  finasteride (PROSCAR) 5 MG tablet Take 10 mg by mouth daily.    Yes Historical Provider, MD  fluticasone (FLONASE) 50 MCG/ACT nasal spray Place 2 sprays into both nostrils every morning.   Yes Historical Provider, MD  Fluticasone-Salmeterol (ADVAIR) 250-50 MCG/DOSE AEPB Inhale 1 puff into the lungs 2 (two) times daily.   Yes Historical Provider, MD  montelukast (SINGULAIR) 10 MG tablet Take 10 mg by mouth at bedtime.   Yes Historical Provider, MD  predniSONE (DELTASONE) 10 MG tablet Take 10 mg by mouth daily. 09/23/15  Yes Historical Provider, MD  Probiotic Product (PROBIOTIC PO) Take 1 capsule by mouth daily.   Yes Historical Provider, MD  temazepam (RESTORIL) 15 MG capsule Take 15-30 mg by mouth at bedtime as needed for sleep.    Yes Historical Provider, MD  Tiotropium Bromide Monohydrate (SPIRIVA RESPIMAT) 2.5 MCG/ACT AERS Inhale 2 puffs into the lungs daily.   Yes Historical Provider, MD  verapamil (COVERA HS) 180 MG (CO) 24 hr tablet Take 1 tablet (180 mg total) by mouth at bedtime. Patient taking differently: Take 180 mg by mouth every evening.  01/16/13  Yes Evans Lance, MD  zolpidem (AMBIEN) 10 MG tablet  Take 5-10 mg by mouth at bedtime.    Yes Historical Provider, MD   Family History Family History  Problem Relation Age of Onset  . Stroke Mother 22  . Lung cancer Father 53  . Leukemia Brother   . Alzheimer's disease Sister   . Autoimmune disease Brother     Polyarteritis nodosa   Social History Social History  Substance Use Topics  . Smoking status: Former Smoker    Packs/day: 0.00    Years: 13.00    Types: Cigarettes    Quit date: 03/29/1956  . Smokeless tobacco: Never Used  . Alcohol use Yes     Comment: wine every night    Allergies   Sulfa antibiotics and Sulfonamide derivatives  Review of Systems Review of Systems 10 Systems reviewed and all are negative for acute change except as noted in the HPI.  Physical Exam Updated Vital Signs BP 117/72  Pulse 73   Temp 97.4 F (36.3 C) (Oral)   Resp 14   SpO2 100%   Physical Exam  Constitutional: He is oriented to person, place, and time. He appears well-developed and well-nourished. No distress.  Alert and oriented. No respiratory distress. Mental status is clear.   HENT:  Head: Normocephalic and atraumatic.  Right Ear: External ear normal.  Left Ear: External ear normal.  Nose: Nose normal.  Eyes: Conjunctivae are normal.  Neck: Normal range of motion.  Cardiovascular: Normal rate, regular rhythm, normal heart sounds and intact distal pulses.  Exam reveals no gallop and no friction rub.   No murmur heard. Pulmonary/Chest: Effort normal. No respiratory distress. He has no wheezes. He has rales. He exhibits no tenderness.  Occasional rales. Good airflow bilaterally to anterior auscultation.   Abdominal: Soft. He exhibits no distension. There is no tenderness. There is no rebound and no guarding.  Musculoskeletal: He exhibits deformity.  LUE: Deformity at proximal humerus. Radial pulses is intact.   RUE: Normal ROM. Large ecchymosis to dorsum of hand and forearm.   Bilateral lower extremities: No deformities  visualized.   Neurological: He is alert and oriented to person, place, and time.  Skin: No pallor.  Psychiatric: He has a normal mood and affect. His behavior is normal.  Nursing note and vitals reviewed.  ED Treatments / Results  DIAGNOSTIC STUDIES: Oxygen Saturation is 97% on RA, normal by my interpretation.   COORDINATION OF CARE: 2:02 AM-Discussed next steps with pt. Pt verbalized understanding and is agreeable with the plan.   Labs (all labs ordered are listed, but only abnormal results are displayed) Labs Reviewed  CBC WITH DIFFERENTIAL/PLATELET - Abnormal; Notable for the following:       Result Value   RBC 3.15 (*)    Hemoglobin 10.3 (*)    HCT 31.4 (*)    Platelets 141 (*)    Lymphs Abs 0.5 (*)    Monocytes Absolute 1.2 (*)    All other components within normal limits  I-STAT TROPOININ, ED - Abnormal; Notable for the following:    Troponin i, poc 0.11 (*)    All other components within normal limits  BASIC METABOLIC PANEL  PROTIME-INR  URINALYSIS, ROUTINE W REFLEX MICROSCOPIC    EKG  EKG Interpretation  Date/Time:  Wednesday March 10 2016 01:08:36 EST Ventricular Rate:  68 PR Interval:    QRS Duration: 94 QT Interval:  411 QTC Calculation: 438 R Axis:   127 Text Interpretation:  Atrial fibrillation Anterior infarct, old agree. no STEMI Confirmed by Johnney Killian, MD, Jeannie Done [phone]) on 03/10/2016 4:33:20 AM      Radiology Dg Shoulder Left  Result Date: 03/10/2016 CLINICAL DATA:  Status post fall on outstretched arm EXAM: LEFT SHOULDER - 2+ VIEW COMPARISON:  None. FINDINGS: There is anterior dislocation of the left glenohumeral joint. Of the acromioclavicular joint is normal. No acute fracture is identified. IMPRESSION: Anterior dislocation of the left glenohumeral joint. Electronically Signed   By: Ulyses Jarred M.D.   On: 03/10/2016 02:50   Dg Shoulder Left Port  Result Date: 03/10/2016 CLINICAL DATA:  82 y/o  M; post reduction. EXAM: LEFT SHOULDER - 1  VIEW COMPARISON:  03/10/2016 shoulder radiographs. FINDINGS: Interval reduction of the shoulder dislocation with the humeral head now seated on the glenoid. Loss of the superior acromiohumeral interval indicating probable underlying rotator cuff injury. No acute fracture is identified. IMPRESSION: Interval reduction of the shoulder dislocation with the humeral head now seated on  the glenoid. Loss of the superior acromiohumeral interval indicating probable underlying rotator cuff injury. No acute fracture is identified. Electronically Signed   By: Kristine Garbe M.D.   On: 03/10/2016 03:32    Procedures .Sedation Date/Time: 03/10/2016 3:21 AM Performed by: Charlesetta Shanks Authorized by: Charlesetta Shanks   Consent:    Consent obtained:  Written   Consent given by:  Patient and healthcare agent   Risks discussed:  Allergic reaction, prolonged hypoxia resulting in organ damage, prolonged sedation necessitating reversal, respiratory compromise necessitating ventilatory assistance and intubation, inadequate sedation, dysrhythmia, nausea and vomiting   Alternatives discussed:  Analgesia without sedation Indications:    Procedure performed:  Dislocation reduction   Procedure necessitating sedation performed by:  Physician performing sedation   Intended level of sedation:  Moderate (conscious sedation) Pre-sedation assessment:    NPO status caution: urgency dictates proceeding with non-ideal NPO status     ASA classification: class 4 - patient with severe systemic disease that is a constant threat to life     Neck mobility: reduced     Mouth opening:  2 finger widths   Thyromental distance:  2 finger widths   Mallampati score:  III - soft palate, base of uvula visible   Pre-sedation assessments completed and reviewed: airway patency, cardiovascular function, hydration status, mental status, nausea/vomiting, pain level, respiratory function and temperature     History of difficult  intubation: no     Pre-sedation assessment completed:  03/10/2016 2:30 AM Procedure details (see MAR for exact dosages):    Sedation start time:  03/10/2016 2:45 AM   Preoxygenation:  Nasal cannula   Sedation:  Propofol   Intra-procedure monitoring:  Blood pressure monitoring, cardiac monitor, continuous capnometry, continuous pulse oximetry, frequent LOC assessments and frequent vital sign checks   Intra-procedure events: hypoxia     Intra-procedure management:  BVM ventilation and sedation reversal   Reversal agents:  Naloxone   Sedation end time:  03/10/2016 3:15 AM   Total sedation time (minutes):  30 Post-procedure details:    Post-sedation assessment completed:  03/10/2016 4:00 AM   Attendance: Constant attendance by certified staff until patient recovered     Recovery: Patient returned to pre-procedure baseline     Complications:  NON SUSTAINED VT RESOLVED WITHOUT NECESSITATING INTERVENTION   Post-sedation assessments completed and reviewed: airway patency, cardiovascular function, hydration status, mental status, nausea/vomiting, pain level, respiratory function and temperature     Patient is stable for discharge or admission: yes     Patient tolerance:  Tolerated well, no immediate complications Comments:     Patient had self-limited episode of ventricular tachycardia that was consistent with [place] just after administration of propofol. This was brief and did not require intervention. Patient returned to a regular appearing Rhythm (ratw controlled Afib) in the 70s without drop in blood pressure. Once patient's shoulder was in reduced position, patient's oxygen saturation saturations drifted down to mid 80s. Patient was supported by bag valve mask with good response and maintained oxygen saturation is at 100% with no episodes of emesis or difficulty bagging. Patient was given Narcan to reverse the effects of Dilaudid given for pain control prior to the procedure. As patient  awakened, mental status is normal. Patient is back to baseline with normal speech and baseline cognitive function.       Medications Ordered in ED Medications  HYDROmorphone (DILAUDID) injection 0.5 mg (0.5 mg Intravenous Not Given 03/10/16 0313)  naloxone (NARCAN) 0.4 MG/ML injection (not administered)  morphine 4 MG/ML injection 4 mg (4 mg Intravenous Given 03/10/16 0209)  HYDROmorphone (DILAUDID) injection 0.5 mg (0.5 mg Intravenous Given 03/10/16 0240)  propofol (DIPRIVAN) 10 mg/mL bolus/IV push 39.3 mg (39.3 mg Intravenous Given 03/10/16 0308)  propofol (DIPRIVAN) 10 mg/mL bolus/IV push (40 mg Intravenous Given 03/10/16 0310)  0.9 %  sodium chloride infusion (100 mL/hr Intravenous New Bag/Given 03/10/16 0305)  naloxone [facility]) injection (0.4 mg Intravenous Given 03/10/16 8403)  aspirin chewable tablet 81 mg (81 mg Oral Given 03/10/16 0431)  HYDROcodone-acetaminophen (NORCO/VICODIN) 5-325 MG per tablet 1 tablet (1 tablet Oral Given 03/10/16 0431)    Initial Impression / Assessment and Plan / ED Course  I have reviewed the triage vital signs and the nursing notes.  Pertinent labs & imaging results that were available during my care of the patient were reviewed by me and considered in my medical decision making (see chart for details).  Clinical Course   Consult: Dr. Fabio Neighbors for admission.  Final Clinical Impressions(s) / ED Diagnoses   Final diagnoses:  Shoulder dislocation, left, initial encounter  Troponin I above reference range  Impaired mobility and ADLs   Patient presents predominantly for mechanical fall with shoulder dislocation. Relocation management as per procedure note. Patient did have episode of nonsustained V. tach during sedation. Patient will be placed in observation for continued monitoring of heart rhythm and rule out of MI. Patient also will have difficulty with activities of daily living at home. Patient does live independently but uses a walker and has high  fall risk. Patient will be unable to use a walker until strength and mobility of upper extremity is regained. New Prescriptions New Prescriptions   No medications on file       Charlesetta Shanks, MD 03/10/16 [phone]

## 2016-03-10 NOTE — ED Notes (Signed)
Patient transported to X-ray 

## 2016-03-10 NOTE — ED Notes (Signed)
Ortho Tech in room placing immobilizer

## 2016-03-11 ENCOUNTER — Inpatient Hospital Stay: Admission: RE | Admit: 2016-03-11 | Payer: Medicare Other | Source: Ambulatory Visit

## 2016-03-11 DIAGNOSIS — I10 Essential (primary) hypertension: Secondary | ICD-10-CM | POA: Diagnosis not present

## 2016-03-11 DIAGNOSIS — J449 Chronic obstructive pulmonary disease, unspecified: Secondary | ICD-10-CM | POA: Diagnosis not present

## 2016-03-11 DIAGNOSIS — S43005A Unspecified dislocation of left shoulder joint, initial encounter: Secondary | ICD-10-CM | POA: Diagnosis not present

## 2016-03-11 DIAGNOSIS — M5136 Other intervertebral disc degeneration, lumbar region: Secondary | ICD-10-CM | POA: Diagnosis not present

## 2016-03-11 DIAGNOSIS — C3412 Malignant neoplasm of upper lobe, left bronchus or lung: Secondary | ICD-10-CM | POA: Diagnosis not present

## 2016-03-11 DIAGNOSIS — R58 Hemorrhage, not elsewhere classified: Secondary | ICD-10-CM | POA: Diagnosis not present

## 2016-03-11 DIAGNOSIS — J45909 Unspecified asthma, uncomplicated: Secondary | ICD-10-CM | POA: Diagnosis not present

## 2016-03-11 DIAGNOSIS — J45998 Other asthma: Secondary | ICD-10-CM | POA: Diagnosis not present

## 2016-03-11 DIAGNOSIS — Z6824 Body mass index (BMI) 24.0-24.9, adult: Secondary | ICD-10-CM | POA: Diagnosis not present

## 2016-03-11 DIAGNOSIS — W19XXXA Unspecified fall, initial encounter: Secondary | ICD-10-CM | POA: Diagnosis not present

## 2016-03-11 DIAGNOSIS — S43005D Unspecified dislocation of left shoulder joint, subsequent encounter: Secondary | ICD-10-CM | POA: Diagnosis not present

## 2016-03-11 DIAGNOSIS — M1990 Unspecified osteoarthritis, unspecified site: Secondary | ICD-10-CM | POA: Diagnosis not present

## 2016-03-12 ENCOUNTER — Other Ambulatory Visit: Payer: Self-pay | Admitting: Internal Medicine

## 2016-03-12 ENCOUNTER — Encounter: Payer: Self-pay | Admitting: Licensed Clinical Social Worker

## 2016-03-12 ENCOUNTER — Other Ambulatory Visit: Payer: Self-pay | Admitting: Licensed Clinical Social Worker

## 2016-03-12 DIAGNOSIS — C3412 Malignant neoplasm of upper lobe, left bronchus or lung: Secondary | ICD-10-CM

## 2016-03-12 NOTE — Patient Outreach (Addendum)
[facility]) Care Management  03/12/2016  [name] September 16, 1924 [id number]   Assessment- CSW completed to call to patient. Patient's aide answered and reports that patient had a fall on 03/10/16 and was admitted into the hospital. Patient then was put on the phone and he stated that he was doing "okay." Aide and patient state that they no longer need social work services as they are "going a different route than having just a night time aide." Family are no longer interested in [place] work assistance but Programme researcher, broadcasting/film/video support. Family do not wish for [facility] nursing to be involved as well.   Plan-CSW will discharge patient from caseload and will notify [place] Management Assistant and PCP.  Eula Fried, BSW, MSW, [email or web address] Phone: [phone] Fax: [phone]

## 2016-03-15 DIAGNOSIS — I10 Essential (primary) hypertension: Secondary | ICD-10-CM | POA: Diagnosis not present

## 2016-03-15 DIAGNOSIS — M5136 Other intervertebral disc degeneration, lumbar region: Secondary | ICD-10-CM | POA: Diagnosis not present

## 2016-03-15 DIAGNOSIS — M1990 Unspecified osteoarthritis, unspecified site: Secondary | ICD-10-CM | POA: Diagnosis not present

## 2016-03-15 DIAGNOSIS — J449 Chronic obstructive pulmonary disease, unspecified: Secondary | ICD-10-CM | POA: Diagnosis not present

## 2016-03-15 DIAGNOSIS — S43005D Unspecified dislocation of left shoulder joint, subsequent encounter: Secondary | ICD-10-CM | POA: Diagnosis not present

## 2016-03-15 DIAGNOSIS — J45909 Unspecified asthma, uncomplicated: Secondary | ICD-10-CM | POA: Diagnosis not present

## 2016-03-16 ENCOUNTER — Encounter: Payer: Self-pay | Admitting: Licensed Clinical Social Worker

## 2016-03-16 DIAGNOSIS — I10 Essential (primary) hypertension: Secondary | ICD-10-CM | POA: Diagnosis not present

## 2016-03-16 DIAGNOSIS — J45909 Unspecified asthma, uncomplicated: Secondary | ICD-10-CM | POA: Diagnosis not present

## 2016-03-16 DIAGNOSIS — M5136 Other intervertebral disc degeneration, lumbar region: Secondary | ICD-10-CM | POA: Diagnosis not present

## 2016-03-16 DIAGNOSIS — J449 Chronic obstructive pulmonary disease, unspecified: Secondary | ICD-10-CM | POA: Diagnosis not present

## 2016-03-16 DIAGNOSIS — M1990 Unspecified osteoarthritis, unspecified site: Secondary | ICD-10-CM | POA: Diagnosis not present

## 2016-03-16 DIAGNOSIS — S43005D Unspecified dislocation of left shoulder joint, subsequent encounter: Secondary | ICD-10-CM | POA: Diagnosis not present

## 2016-03-18 DIAGNOSIS — S43005D Unspecified dislocation of left shoulder joint, subsequent encounter: Secondary | ICD-10-CM | POA: Diagnosis not present

## 2016-03-18 DIAGNOSIS — I10 Essential (primary) hypertension: Secondary | ICD-10-CM | POA: Diagnosis not present

## 2016-03-18 DIAGNOSIS — M1990 Unspecified osteoarthritis, unspecified site: Secondary | ICD-10-CM | POA: Diagnosis not present

## 2016-03-18 DIAGNOSIS — M5136 Other intervertebral disc degeneration, lumbar region: Secondary | ICD-10-CM | POA: Diagnosis not present

## 2016-03-18 DIAGNOSIS — J449 Chronic obstructive pulmonary disease, unspecified: Secondary | ICD-10-CM | POA: Diagnosis not present

## 2016-03-18 DIAGNOSIS — J45909 Unspecified asthma, uncomplicated: Secondary | ICD-10-CM | POA: Diagnosis not present

## 2016-03-19 DIAGNOSIS — I10 Essential (primary) hypertension: Secondary | ICD-10-CM | POA: Diagnosis not present

## 2016-03-19 DIAGNOSIS — M5136 Other intervertebral disc degeneration, lumbar region: Secondary | ICD-10-CM | POA: Diagnosis not present

## 2016-03-19 DIAGNOSIS — J449 Chronic obstructive pulmonary disease, unspecified: Secondary | ICD-10-CM | POA: Diagnosis not present

## 2016-03-19 DIAGNOSIS — S43005D Unspecified dislocation of left shoulder joint, subsequent encounter: Secondary | ICD-10-CM | POA: Diagnosis not present

## 2016-03-19 DIAGNOSIS — S43085A Other dislocation of left shoulder joint, initial encounter: Secondary | ICD-10-CM | POA: Diagnosis not present

## 2016-03-19 DIAGNOSIS — J45909 Unspecified asthma, uncomplicated: Secondary | ICD-10-CM | POA: Diagnosis not present

## 2016-03-19 DIAGNOSIS — M1990 Unspecified osteoarthritis, unspecified site: Secondary | ICD-10-CM | POA: Diagnosis not present

## 2016-03-23 ENCOUNTER — Other Ambulatory Visit (HOSPITAL_BASED_OUTPATIENT_CLINIC_OR_DEPARTMENT_OTHER): Payer: Medicare Other

## 2016-03-23 DIAGNOSIS — J45909 Unspecified asthma, uncomplicated: Secondary | ICD-10-CM | POA: Diagnosis not present

## 2016-03-23 DIAGNOSIS — S43005D Unspecified dislocation of left shoulder joint, subsequent encounter: Secondary | ICD-10-CM | POA: Diagnosis not present

## 2016-03-23 DIAGNOSIS — M5136 Other intervertebral disc degeneration, lumbar region: Secondary | ICD-10-CM | POA: Diagnosis not present

## 2016-03-23 DIAGNOSIS — M1990 Unspecified osteoarthritis, unspecified site: Secondary | ICD-10-CM | POA: Diagnosis not present

## 2016-03-23 DIAGNOSIS — C3491 Malignant neoplasm of unspecified part of right bronchus or lung: Secondary | ICD-10-CM

## 2016-03-23 DIAGNOSIS — Z85118 Personal history of other malignant neoplasm of bronchus and lung: Secondary | ICD-10-CM

## 2016-03-23 DIAGNOSIS — J449 Chronic obstructive pulmonary disease, unspecified: Secondary | ICD-10-CM | POA: Diagnosis not present

## 2016-03-23 DIAGNOSIS — I10 Essential (primary) hypertension: Secondary | ICD-10-CM | POA: Diagnosis not present

## 2016-03-23 LAB — CBC WITH DIFFERENTIAL/PLATELET
BASO%: 0.8 % (ref 0.0–2.0)
BASOS ABS: 0.1 10*3/uL (ref 0.0–0.1)
EOS%: 1.1 % (ref 0.0–7.0)
Eosinophils Absolute: 0.1 10*3/uL (ref 0.0–0.5)
HEMATOCRIT: 30.5 % — AB (ref 38.4–49.9)
HEMOGLOBIN: 10.2 g/dL — AB (ref 13.0–17.1)
LYMPH#: 0.7 10*3/uL — AB (ref 0.9–3.3)
LYMPH%: 7 % — ABNORMAL LOW (ref 14.0–49.0)
MCH: 33.2 pg (ref 27.2–33.4)
MCHC: 33.3 g/dL (ref 32.0–36.0)
MCV: 99.7 fL — ABNORMAL HIGH (ref 79.3–98.0)
MONO#: 1.1 10*3/uL — AB (ref 0.1–0.9)
MONO%: 11.9 % (ref 0.0–14.0)
NEUT#: 7.6 10*3/uL — ABNORMAL HIGH (ref 1.5–6.5)
NEUT%: 79.2 % — ABNORMAL HIGH (ref 39.0–75.0)
PLATELETS: 309 10*3/uL (ref 140–400)
RBC: 3.06 10*6/uL — ABNORMAL LOW (ref 4.20–5.82)
RDW: 15 % — AB (ref 11.0–14.6)
WBC: 9.6 10*3/uL (ref 4.0–10.3)

## 2016-03-23 LAB — COMPREHENSIVE METABOLIC PANEL
ALBUMIN: 3.1 g/dL — AB (ref 3.5–5.0)
ALK PHOS: 63 U/L (ref 40–150)
ALT: 24 U/L (ref 0–55)
ANION GAP: 6 meq/L (ref 3–11)
AST: 27 U/L (ref 5–34)
BUN: 20.6 mg/dL (ref 7.0–26.0)
CALCIUM: 9.9 mg/dL (ref 8.4–10.4)
CO2: 29 mEq/L (ref 22–29)
CREATININE: 1 mg/dL (ref 0.7–1.3)
Chloride: 100 mEq/L (ref 98–109)
EGFR: 64 mL/min/{1.73_m2} — ABNORMAL LOW (ref >90)
Glucose: 89 mg/dl (ref 70–140)
Potassium: 4.4 mEq/L (ref 3.5–5.1)
Sodium: 134 mEq/L — ABNORMAL LOW (ref 136–145)
Total Bilirubin: 1.1 mg/dL (ref 0.20–1.20)
Total Protein: 6.3 g/dL — ABNORMAL LOW (ref 6.4–8.3)

## 2016-03-24 ENCOUNTER — Ambulatory Visit
Admission: RE | Admit: 2016-03-24 | Discharge: 2016-03-24 | Disposition: A | Discharge status: Home / Self Care | Payer: Medicare Other | Source: Ambulatory Visit | Attending: Internal Medicine | Admitting: Internal Medicine

## 2016-03-24 DIAGNOSIS — C3492 Malignant neoplasm of unspecified part of left bronchus or lung: Secondary | ICD-10-CM | POA: Diagnosis not present

## 2016-03-24 DIAGNOSIS — C3412 Malignant neoplasm of upper lobe, left bronchus or lung: Secondary | ICD-10-CM

## 2016-03-24 MED ORDER — IOPAMIDOL (ISOVUE-300) INJECTION 61%
75.0000 mL | Freq: Once | INTRAVENOUS | Status: AC | PRN
  Administered 2016-03-24: 75 mL via INTRAVENOUS

## 2016-03-25 ENCOUNTER — Encounter: Payer: Self-pay | Admitting: Internal Medicine

## 2016-03-25 ENCOUNTER — Telehealth: Payer: Self-pay | Admitting: Internal Medicine

## 2016-03-25 ENCOUNTER — Ambulatory Visit (HOSPITAL_BASED_OUTPATIENT_CLINIC_OR_DEPARTMENT_OTHER): Payer: Medicare Other | Admitting: Internal Medicine

## 2016-03-25 ENCOUNTER — Telehealth: Payer: Self-pay | Admitting: Emergency Medicine

## 2016-03-25 VITALS — BP 120/75 | HR 75 | Temp 97.8°F | Resp 19 | Wt 178.3 lb

## 2016-03-25 DIAGNOSIS — J449 Chronic obstructive pulmonary disease, unspecified: Secondary | ICD-10-CM | POA: Diagnosis not present

## 2016-03-25 DIAGNOSIS — J45909 Unspecified asthma, uncomplicated: Secondary | ICD-10-CM | POA: Diagnosis not present

## 2016-03-25 DIAGNOSIS — S43005D Unspecified dislocation of left shoulder joint, subsequent encounter: Secondary | ICD-10-CM | POA: Diagnosis not present

## 2016-03-25 DIAGNOSIS — Z85118 Personal history of other malignant neoplasm of bronchus and lung: Secondary | ICD-10-CM | POA: Diagnosis not present

## 2016-03-25 DIAGNOSIS — M7989 Other specified soft tissue disorders: Secondary | ICD-10-CM | POA: Insufficient documentation

## 2016-03-25 DIAGNOSIS — R0989 Other specified symptoms and signs involving the circulatory and respiratory systems: Secondary | ICD-10-CM

## 2016-03-25 DIAGNOSIS — M1990 Unspecified osteoarthritis, unspecified site: Secondary | ICD-10-CM | POA: Diagnosis not present

## 2016-03-25 DIAGNOSIS — Z471 Aftercare following joint replacement surgery: Secondary | ICD-10-CM | POA: Diagnosis not present

## 2016-03-25 DIAGNOSIS — I10 Essential (primary) hypertension: Secondary | ICD-10-CM | POA: Diagnosis not present

## 2016-03-25 DIAGNOSIS — J90 Pleural effusion, not elsewhere classified: Secondary | ICD-10-CM | POA: Diagnosis not present

## 2016-03-25 DIAGNOSIS — Z96641 Presence of right artificial hip joint: Secondary | ICD-10-CM | POA: Diagnosis not present

## 2016-03-25 DIAGNOSIS — C3491 Malignant neoplasm of unspecified part of right bronchus or lung: Secondary | ICD-10-CM

## 2016-03-25 DIAGNOSIS — J984 Other disorders of lung: Secondary | ICD-10-CM

## 2016-03-25 DIAGNOSIS — M5136 Other intervertebral disc degeneration, lumbar region: Secondary | ICD-10-CM | POA: Diagnosis not present

## 2016-03-25 HISTORY — DX: Other specified symptoms and signs involving the circulatory and respiratory systems: R09.89

## 2016-03-25 MED ORDER — DOXYCYCLINE HYCLATE 100 MG PO TABS: 100.0000 mg | ORAL_TABLET | Freq: Two times a day (BID) | ORAL | 0 refills | Status: DC

## 2016-03-25 NOTE — Telephone Encounter (Signed)
See message.

## 2016-03-25 NOTE — Telephone Encounter (Signed)
Called patient to confirm follow up and lab appt. Left msg on voice mail with appointment info, per 03/25/16 los. Patient bypassed scheduling.

## 2016-03-25 NOTE — Telephone Encounter (Signed)
Received a voicemail from pt on [facility] voicemail, I believe pt dialed the wrong number. Pt states he had to cancel CT of abdomen due to breathing issues. Does plan to reschedule.   CB number [phone]

## 2016-03-25 NOTE — Telephone Encounter (Signed)
I spoke with Arther Dames, patient's personal assistant.  She will talk over with patient when he wants to reschedule the CT, colonoscopy, and EGD.  She will call me back once she has worked out a schedule. Her contact number is [phone]

## 2016-03-25 NOTE — Progress Notes (Signed)
[place] [phone]   [phone]  OFFICE PROGRESS NOTE  [name], MD [place] [address]  DIAGNOSIS: Stage IB (T2a, N0, M0) non-small cell lung cancer, adenocarcinoma presented with left lower lobe superior segment lung mass diagnosed in September 2016.   PRIOR THERAPY:  Status post thermal ablation at [facility] on 01/03/2015.   CURRENT THERAPY: Observation.  INTERVAL HISTORY: [name] 82 y.o. male follow-up visit accompanied by his daughter. The patient is feeling fine today except for swelling of the left upper extremity. He was recently diagnosed with dislocation of the left shoulder that was reduced at the emergency department. He continues to complain of cough productive of yellowish sputum as well as shortness breath with exertion. He was treated 2 weeks ago with doxycycline by his primary care physician. He denied having any current weight loss or night sweats. He has no chest pain or hemoptysis. He denied having any fever or chills. He has no nausea or vomiting. The patient had repeat CT scan of the chest performed recently and he is here for evaluation and discussion of his scan results.  MEDICAL HISTORY: Past Medical History:  Diagnosis Date  . Adenocarcinoma of right lung, stage 1 ([place]) 12/11/2014  . Arthritis    osteoarthritis  . Asthma   . Atrial fibrillation ([place])   . Bowel obstruction   . Bradycardia   . Cancer ([place])    skin cancer  . COPD (chronic obstructive pulmonary disease) (HCC)    mild  . Diverticulosis of colon   . Hemorrhoids   . Hx of adenomatous colonic polyps 02/1997  . Hyperlipidemia   . Hypertension   . 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 Palpitations   . Pleural effusion     ALLERGIES:  is allergic to sulfa antibiotics and sulfonamide derivatives.  MEDICATIONS:  Current Outpatient Prescriptions  Medication Sig Dispense Refill  . albuterol (PROVENTIL HFA;VENTOLIN HFA)  108 (90 Base) MCG/ACT inhaler Inhale 1-2 puffs into the lungs every 6 (six) hours as needed for wheezing or shortness of breath.    Marland Kitchen alendronate (FOSAMAX) 70 MG tablet Take 70 mg by mouth once a week. Take with a full glass of water on an empty stomach.    Marland Kitchen CALCIUM PO Take 1,000 mg by mouth daily.    . cetirizine (ZYRTEC) 10 MG tablet Take 10 mg by mouth every evening.     . Cholecalciferol (VITAMIN D PO) Take 3 capsules by mouth daily.    Marland Kitchen dicyclomine (BENTYL) 10 MG capsule Take 1 capsule (10 mg total) by mouth 4 (four) times daily -  before meals and at bedtime. 120 capsule 5  . finasteride (PROSCAR) 5 MG tablet Take 10 mg by mouth daily.     . fluticasone (FLONASE) 50 MCG/ACT nasal spray Place 2 sprays into both nostrils every morning.    . Fluticasone-Salmeterol (ADVAIR) 250-50 MCG/DOSE AEPB Inhale 1 puff into the lungs 2 (two) times daily.    Marland Kitchen HYDROcodone-acetaminophen (NORCO/VICODIN) 5-325 MG tablet Take 1-2 tablets by mouth every 4 (four) hours as needed for moderate pain. 30 tablet 0  . montelukast (SINGULAIR) 10 MG tablet Take 10 mg by mouth at bedtime.    . predniSONE (DELTASONE) 10 MG tablet Take 10 mg by mouth daily.    . Probiotic Product (PROBIOTIC PO) Take 1 capsule by mouth daily.    . temazepam (RESTORIL) 15 MG capsule Take 15-30 mg by mouth at  bedtime as needed for sleep.     . Tiotropium Bromide Monohydrate (SPIRIVA RESPIMAT) 2.5 MCG/ACT AERS Inhale 2 puffs into the lungs daily.    . verapamil (COVERA HS) 180 MG (CO) 24 hr tablet Take 1 tablet (180 mg total) by mouth at bedtime. (Patient taking differently: Take 180 mg by mouth every evening. ) 30 tablet 5   No current facility-administered medications for this visit.     SURGICAL HISTORY:  Past Surgical History:  Procedure Laterality Date  . APPENDECTOMY    . COLON RESECTION    . CYSTOSCOPY    . LAMINECTOMY     Right L4-L5 Laminectomy  . ROTATOR CUFF REPAIR Right   . TOTAL HIP ARTHROPLASTY Right   . UMBILICAL  HERNIA REPAIR    . VIDEO BRONCHOSCOPY WITH ENDOBRONCHIAL NAVIGATION N/A 12/18/2014   Procedure: VIDEO BRONCHOSCOPY WITH ENDOBRONCHIAL NAVIGATION ;  Surgeon: Collene Gobble, MD;  Location: [place];  Service: Thoracic;  Laterality: N/A;    REVIEW OF SYSTEMS:  Constitutional: positive for fatigue Eyes: negative Ears, nose, mouth, throat, and face: negative Respiratory: positive for cough, dyspnea on exertion and sputum Cardiovascular: negative Gastrointestinal: negative Genitourinary:negative Integument/breast: negative Hematologic/lymphatic: negative Musculoskeletal:positive for arthralgias and muscle weakness Neurological: negative Behavioral/Psych: negative Endocrine: negative Allergic/Immunologic: negative   PHYSICAL EXAMINATION: General appearance: alert, cooperative, fatigued and no distress Head: Normocephalic, without obvious abnormality, atraumatic Neck: no adenopathy, no JVD, supple, symmetrical, trachea midline and thyroid not enlarged, symmetric, no tenderness/mass/nodules Lymph nodes: Cervical, supraclavicular, and axillary nodes normal. Resp: rales bilaterally and wheezes bilaterally Back: symmetric, no curvature. ROM normal. No CVA tenderness. Cardio: regular rate and rhythm, S1, S2 normal, no murmur, click, rub or gallop GI: soft, non-tender; bowel sounds normal; no masses,  no organomegaly Extremities: extremities normal, atraumatic, no cyanosis or edema Neurologic: Alert and oriented X 3, normal strength and tone. Normal symmetric reflexes. Normal coordination and gait  ECOG PERFORMANCE STATUS: 1 - Symptomatic but completely ambulatory  Blood pressure 120/75, pulse 75, temperature 97.8 F (36.6 C), temperature source Oral, resp. rate 19, weight 178 lb 4.8 oz (80.9 kg), SpO2 99 %.  LABORATORY DATA: Lab Results  Component Value Date   WBC 9.6 03/23/2016   HGB 10.2 (L) 03/23/2016   HCT 30.5 (L) 03/23/2016   MCV 99.7 (H) 03/23/2016   PLT 309 03/23/2016       Chemistry      Component Value Date/Time   NA 134 (L) 03/23/2016 1045   K 4.4 03/23/2016 1045   CL 104 03/10/2016 0349   CO2 29 03/23/2016 1045   BUN 20.6 03/23/2016 1045   CREATININE 1.0 03/23/2016 1045      Component Value Date/Time   CALCIUM 9.9 03/23/2016 1045   ALKPHOS 63 03/23/2016 1045   AST 27 03/23/2016 1045   ALT 24 03/23/2016 1045   BILITOT 1.10 03/23/2016 1045       RADIOGRAPHIC STUDIES: Ct Chest W Contrast  Result Date: 03/24/2016 CLINICAL DATA:  LEFT lower lobe squamous cell carcinoma. Prior surgery. Subsequent treatment evaluation. Recent LEFT shoulder dislocation EXAM: CT CHEST WITH CONTRAST TECHNIQUE: Multidetector CT imaging of the chest was performed during intravenous contrast administration. CONTRAST:  61m ISOVUE-300 IOPAMIDOL (ISOVUE-300) INJECTION 61% COMPARISON:  PET-CT 10/13/2015 FINDINGS: Cardiovascular: Coronary artery calcification and aortic atherosclerotic calcification. Mediastinum/Nodes: New fluid collection in the LEFT axilla. There is fluid within the musculature anterior to the LEFT shoulder (image 37, series 2 image 38, series 2). This is most likely posttraumatic fluid associated with the recent  acute shoulder dislocation on 03/10/2016. No mediastinal hilar lymphadenopathy. Pericardial fluid. Esophagus normal. Lungs/Pleura: There is consolidation within the superior aspect of the LEFT lower lobe measuring 3.2 by 3.8 cm compared with 3.5 by 3.2 cm on PET-CT 10/13/2015. No significant change allowing for technique. No new nodularity. Small volume LEFT pleural effusion noted. There is severe bronchiectasis in the RIGHT upper lobe with nodular thickening also unchanged. Focus of peripheral consolidation with air bronchograms in the RIGHT lower lobe (image 84, series 5) suggests focus of infection / pneumonia. Moderate RIGHT effusion has slightly increased. Upper Abdomen: Limited view of the liver, kidneys, pancreas are unremarkable. Normal adrenal glands.  Musculoskeletal: No aggressive osseous lesion. Compression fracture at T12 is unchanged IMPRESSION: 1. Stable consolidation within the superior segment LEFT lower lobe. 2. New focus of airspace consolidation RIGHT lower lobe suggest pulmonary infection. 3. Bilateral pleural effusions again noted. 4. New fluid within the soft tissues surrounding the LEFT shoulder are favored involving hematoma associated with recent anterior shoulder dislocation. Electronically Signed   By: Suzy Bouchard M.D.   On: 03/24/2016 14:41   Dg Shoulder Left  Result Date: 03/10/2016 CLINICAL DATA:  Status post fall on outstretched arm EXAM: LEFT SHOULDER - 2+ VIEW COMPARISON:  None. FINDINGS: There is anterior dislocation of the left glenohumeral joint. Of the acromioclavicular joint is normal. No acute fracture is identified. IMPRESSION: Anterior dislocation of the left glenohumeral joint. Electronically Signed   By: Ulyses Jarred M.D.   On: 03/10/2016 02:50   Dg Shoulder Left Port  Result Date: 03/10/2016 CLINICAL DATA:  82 y/o  M; post reduction. EXAM: LEFT SHOULDER - 1 VIEW COMPARISON:  03/10/2016 shoulder radiographs. FINDINGS: Interval reduction of the shoulder dislocation with the humeral head now seated on the glenoid. Loss of the superior acromiohumeral interval indicating probable underlying rotator cuff injury. No acute fracture is identified. IMPRESSION: Interval reduction of the shoulder dislocation with the humeral head now seated on the glenoid. Loss of the superior acromiohumeral interval indicating probable underlying rotator cuff injury. No acute fracture is identified. Electronically Signed   By: Kristine Garbe M.D.   On: 03/10/2016 03:32    ASSESSMENT AND PLAN:  This is a very pleasant 82 years old white male diagnosed with: 1) stage IB non-small cell lung cancer, adenocarcinoma in September 2016 status post thermal ablation of the tumor at [facility] in October 2016. He has been on  observation since that time. The patient had recent CT scan of the chest. I personally and independently reviewed the scan images and discussed the results with the patient and his daughter and showed them the images. The scan showed no clear evidence for disease recurrence but there are several areas of airspace disease in the left upper lobe as well as right lower lobe with right pleural effusion. I recommended for the patient to continue on observation with repeat CT scan of the chest in 3 months. 2) For the inflammatory airspace disease in the right lower lobe and left lung, I will start the patient on doxycycline 100 mg by mouth twice a day. He was advised to contact his primary care physician if no improvement in his symptoms. 3) Swelling of the left upper extremity, this is most likely secondary to his recent left shoulder dislocation and hematoma in that area. If no improvement the patient may need evaluation with Doppler of the upper extremity to rule out deep venous thrombosis. He was advised to call immediately if he has any concerning  symptoms in the interval. The patient voices understanding of current disease status and treatment options and is in agreement with the current care plan.  All questions were answered. The patient knows to call the clinic with any problems, questions or concerns. We can certainly see the patient much sooner if necessary.  Disclaimer: This note was dictated with voice recognition software. Similar sounding words can inadvertently be transcribed and may not be corrected upon review.

## 2016-03-26 ENCOUNTER — Emergency Department (HOSPITAL_COMMUNITY)
Admission: EM | Admit: 2016-03-26 | Discharge: 2016-03-26 | Disposition: A | Discharge status: Home / Self Care | Payer: Medicare Other

## 2016-03-26 NOTE — ED Notes (Signed)
Pt's family came to desk and gave registration stickers stating the pt is going home.

## 2016-03-26 NOTE — Telephone Encounter (Signed)
Patient's assistant Joelene Millin Romine called back to say the the patient declines to schedule CT, EGD, or colonoscopy for now .  He will call back wen he is ready to schedule. See lab results from 03/09/16 for additional details.

## 2016-03-29 DIAGNOSIS — M1990 Unspecified osteoarthritis, unspecified site: Secondary | ICD-10-CM | POA: Diagnosis not present

## 2016-03-29 DIAGNOSIS — I10 Essential (primary) hypertension: Secondary | ICD-10-CM | POA: Diagnosis not present

## 2016-03-29 DIAGNOSIS — J45909 Unspecified asthma, uncomplicated: Secondary | ICD-10-CM | POA: Diagnosis not present

## 2016-03-29 DIAGNOSIS — M5136 Other intervertebral disc degeneration, lumbar region: Secondary | ICD-10-CM | POA: Diagnosis not present

## 2016-03-29 DIAGNOSIS — S43005D Unspecified dislocation of left shoulder joint, subsequent encounter: Secondary | ICD-10-CM | POA: Diagnosis not present

## 2016-03-29 DIAGNOSIS — J449 Chronic obstructive pulmonary disease, unspecified: Secondary | ICD-10-CM | POA: Diagnosis not present

## 2016-03-31 DIAGNOSIS — M12812 Other specific arthropathies, not elsewhere classified, left shoulder: Secondary | ICD-10-CM | POA: Diagnosis not present

## 2016-03-31 DIAGNOSIS — L814 Other melanin hyperpigmentation: Secondary | ICD-10-CM | POA: Diagnosis not present

## 2016-03-31 DIAGNOSIS — M25312 Other instability, left shoulder: Secondary | ICD-10-CM | POA: Diagnosis not present

## 2016-03-31 DIAGNOSIS — D692 Other nonthrombocytopenic purpura: Secondary | ICD-10-CM | POA: Diagnosis not present

## 2016-03-31 DIAGNOSIS — Z85828 Personal history of other malignant neoplasm of skin: Secondary | ICD-10-CM | POA: Diagnosis not present

## 2016-03-31 DIAGNOSIS — L918 Other hypertrophic disorders of the skin: Secondary | ICD-10-CM | POA: Diagnosis not present

## 2016-03-31 DIAGNOSIS — L821 Other seborrheic keratosis: Secondary | ICD-10-CM | POA: Diagnosis not present

## 2016-03-31 DIAGNOSIS — D1801 Hemangioma of skin and subcutaneous tissue: Secondary | ICD-10-CM | POA: Diagnosis not present

## 2016-03-31 DIAGNOSIS — D3617 Benign neoplasm of peripheral nerves and autonomic nervous system of trunk, unspecified: Secondary | ICD-10-CM | POA: Diagnosis not present

## 2016-04-01 ENCOUNTER — Telehealth: Payer: Self-pay | Admitting: Gastroenterology

## 2016-04-01 DIAGNOSIS — J45909 Unspecified asthma, uncomplicated: Secondary | ICD-10-CM | POA: Diagnosis not present

## 2016-04-01 DIAGNOSIS — M5136 Other intervertebral disc degeneration, lumbar region: Secondary | ICD-10-CM | POA: Diagnosis not present

## 2016-04-01 DIAGNOSIS — J449 Chronic obstructive pulmonary disease, unspecified: Secondary | ICD-10-CM | POA: Diagnosis not present

## 2016-04-01 DIAGNOSIS — S43005D Unspecified dislocation of left shoulder joint, subsequent encounter: Secondary | ICD-10-CM | POA: Diagnosis not present

## 2016-04-01 DIAGNOSIS — M1990 Unspecified osteoarthritis, unspecified site: Secondary | ICD-10-CM | POA: Diagnosis not present

## 2016-04-01 DIAGNOSIS — I10 Essential (primary) hypertension: Secondary | ICD-10-CM | POA: Diagnosis not present

## 2016-04-01 NOTE — Telephone Encounter (Signed)
Left message for patient to call back  

## 2016-04-02 DIAGNOSIS — J449 Chronic obstructive pulmonary disease, unspecified: Secondary | ICD-10-CM | POA: Diagnosis not present

## 2016-04-02 DIAGNOSIS — S43005D Unspecified dislocation of left shoulder joint, subsequent encounter: Secondary | ICD-10-CM | POA: Diagnosis not present

## 2016-04-02 DIAGNOSIS — M1990 Unspecified osteoarthritis, unspecified site: Secondary | ICD-10-CM | POA: Diagnosis not present

## 2016-04-02 DIAGNOSIS — I10 Essential (primary) hypertension: Secondary | ICD-10-CM | POA: Diagnosis not present

## 2016-04-02 DIAGNOSIS — M5136 Other intervertebral disc degeneration, lumbar region: Secondary | ICD-10-CM | POA: Diagnosis not present

## 2016-04-02 DIAGNOSIS — J45909 Unspecified asthma, uncomplicated: Secondary | ICD-10-CM | POA: Diagnosis not present

## 2016-04-02 NOTE — Telephone Encounter (Signed)
See phone notes from 03/25/16.  This letter was mailed to the patient prior to this date, because I was not able to reach him. I left a message for [name] to call if he wants to pursue procedures and CT

## 2016-04-05 DIAGNOSIS — S43005D Unspecified dislocation of left shoulder joint, subsequent encounter: Secondary | ICD-10-CM | POA: Diagnosis not present

## 2016-04-05 DIAGNOSIS — M1990 Unspecified osteoarthritis, unspecified site: Secondary | ICD-10-CM | POA: Diagnosis not present

## 2016-04-05 DIAGNOSIS — J45909 Unspecified asthma, uncomplicated: Secondary | ICD-10-CM | POA: Diagnosis not present

## 2016-04-05 DIAGNOSIS — I10 Essential (primary) hypertension: Secondary | ICD-10-CM | POA: Diagnosis not present

## 2016-04-05 DIAGNOSIS — M5136 Other intervertebral disc degeneration, lumbar region: Secondary | ICD-10-CM | POA: Diagnosis not present

## 2016-04-05 DIAGNOSIS — J449 Chronic obstructive pulmonary disease, unspecified: Secondary | ICD-10-CM | POA: Diagnosis not present

## 2016-04-06 DIAGNOSIS — M5136 Other intervertebral disc degeneration, lumbar region: Secondary | ICD-10-CM | POA: Diagnosis not present

## 2016-04-06 DIAGNOSIS — J45909 Unspecified asthma, uncomplicated: Secondary | ICD-10-CM | POA: Diagnosis not present

## 2016-04-06 DIAGNOSIS — M1990 Unspecified osteoarthritis, unspecified site: Secondary | ICD-10-CM | POA: Diagnosis not present

## 2016-04-06 DIAGNOSIS — I10 Essential (primary) hypertension: Secondary | ICD-10-CM | POA: Diagnosis not present

## 2016-04-06 DIAGNOSIS — J449 Chronic obstructive pulmonary disease, unspecified: Secondary | ICD-10-CM | POA: Diagnosis not present

## 2016-04-06 DIAGNOSIS — S43005D Unspecified dislocation of left shoulder joint, subsequent encounter: Secondary | ICD-10-CM | POA: Diagnosis not present

## 2016-04-07 DIAGNOSIS — J449 Chronic obstructive pulmonary disease, unspecified: Secondary | ICD-10-CM | POA: Diagnosis not present

## 2016-04-07 DIAGNOSIS — E2740 Unspecified adrenocortical insufficiency: Secondary | ICD-10-CM | POA: Diagnosis not present

## 2016-04-07 DIAGNOSIS — R350 Frequency of micturition: Secondary | ICD-10-CM | POA: Diagnosis not present

## 2016-04-07 DIAGNOSIS — I480 Paroxysmal atrial fibrillation: Secondary | ICD-10-CM | POA: Diagnosis not present

## 2016-04-07 DIAGNOSIS — C3412 Malignant neoplasm of upper lobe, left bronchus or lung: Secondary | ICD-10-CM | POA: Diagnosis not present

## 2016-04-07 DIAGNOSIS — R2689 Other abnormalities of gait and mobility: Secondary | ICD-10-CM | POA: Diagnosis not present

## 2016-04-07 DIAGNOSIS — Z6824 Body mass index (BMI) 24.0-24.9, adult: Secondary | ICD-10-CM | POA: Diagnosis not present

## 2016-04-07 DIAGNOSIS — I471 Supraventricular tachycardia: Secondary | ICD-10-CM | POA: Diagnosis not present

## 2016-04-07 DIAGNOSIS — I10 Essential (primary) hypertension: Secondary | ICD-10-CM | POA: Diagnosis not present

## 2016-04-07 DIAGNOSIS — R7301 Impaired fasting glucose: Secondary | ICD-10-CM | POA: Diagnosis not present

## 2016-04-08 DIAGNOSIS — M1990 Unspecified osteoarthritis, unspecified site: Secondary | ICD-10-CM | POA: Diagnosis not present

## 2016-04-08 DIAGNOSIS — S43005D Unspecified dislocation of left shoulder joint, subsequent encounter: Secondary | ICD-10-CM | POA: Diagnosis not present

## 2016-04-08 DIAGNOSIS — M5136 Other intervertebral disc degeneration, lumbar region: Secondary | ICD-10-CM | POA: Diagnosis not present

## 2016-04-08 DIAGNOSIS — I10 Essential (primary) hypertension: Secondary | ICD-10-CM | POA: Diagnosis not present

## 2016-04-08 DIAGNOSIS — J449 Chronic obstructive pulmonary disease, unspecified: Secondary | ICD-10-CM | POA: Diagnosis not present

## 2016-04-08 DIAGNOSIS — J45909 Unspecified asthma, uncomplicated: Secondary | ICD-10-CM | POA: Diagnosis not present

## 2016-04-09 DIAGNOSIS — I10 Essential (primary) hypertension: Secondary | ICD-10-CM | POA: Diagnosis not present

## 2016-04-09 DIAGNOSIS — J45909 Unspecified asthma, uncomplicated: Secondary | ICD-10-CM | POA: Diagnosis not present

## 2016-04-09 DIAGNOSIS — M1990 Unspecified osteoarthritis, unspecified site: Secondary | ICD-10-CM | POA: Diagnosis not present

## 2016-04-09 DIAGNOSIS — S43005D Unspecified dislocation of left shoulder joint, subsequent encounter: Secondary | ICD-10-CM | POA: Diagnosis not present

## 2016-04-09 DIAGNOSIS — J449 Chronic obstructive pulmonary disease, unspecified: Secondary | ICD-10-CM | POA: Diagnosis not present

## 2016-04-09 DIAGNOSIS — M5136 Other intervertebral disc degeneration, lumbar region: Secondary | ICD-10-CM | POA: Diagnosis not present

## 2016-04-12 DIAGNOSIS — I10 Essential (primary) hypertension: Secondary | ICD-10-CM | POA: Diagnosis not present

## 2016-04-12 DIAGNOSIS — M1990 Unspecified osteoarthritis, unspecified site: Secondary | ICD-10-CM | POA: Diagnosis not present

## 2016-04-12 DIAGNOSIS — M5136 Other intervertebral disc degeneration, lumbar region: Secondary | ICD-10-CM | POA: Diagnosis not present

## 2016-04-12 DIAGNOSIS — J449 Chronic obstructive pulmonary disease, unspecified: Secondary | ICD-10-CM | POA: Diagnosis not present

## 2016-04-12 DIAGNOSIS — J45909 Unspecified asthma, uncomplicated: Secondary | ICD-10-CM | POA: Diagnosis not present

## 2016-04-12 DIAGNOSIS — S43005D Unspecified dislocation of left shoulder joint, subsequent encounter: Secondary | ICD-10-CM | POA: Diagnosis not present

## 2016-04-13 DIAGNOSIS — J3089 Other allergic rhinitis: Secondary | ICD-10-CM | POA: Diagnosis not present

## 2016-04-13 DIAGNOSIS — J45909 Unspecified asthma, uncomplicated: Secondary | ICD-10-CM | POA: Diagnosis not present

## 2016-04-13 DIAGNOSIS — J301 Allergic rhinitis due to pollen: Secondary | ICD-10-CM | POA: Diagnosis not present

## 2016-04-13 DIAGNOSIS — J449 Chronic obstructive pulmonary disease, unspecified: Secondary | ICD-10-CM | POA: Diagnosis not present

## 2016-04-13 DIAGNOSIS — M1990 Unspecified osteoarthritis, unspecified site: Secondary | ICD-10-CM | POA: Diagnosis not present

## 2016-04-13 DIAGNOSIS — M5136 Other intervertebral disc degeneration, lumbar region: Secondary | ICD-10-CM | POA: Diagnosis not present

## 2016-04-13 DIAGNOSIS — I10 Essential (primary) hypertension: Secondary | ICD-10-CM | POA: Diagnosis not present

## 2016-04-13 DIAGNOSIS — S43005D Unspecified dislocation of left shoulder joint, subsequent encounter: Secondary | ICD-10-CM | POA: Diagnosis not present

## 2016-04-15 DIAGNOSIS — M1711 Unilateral primary osteoarthritis, right knee: Secondary | ICD-10-CM | POA: Diagnosis not present

## 2016-04-15 DIAGNOSIS — S32591D Other specified fracture of right pubis, subsequent encounter for fracture with routine healing: Secondary | ICD-10-CM | POA: Diagnosis not present

## 2016-04-16 DIAGNOSIS — J449 Chronic obstructive pulmonary disease, unspecified: Secondary | ICD-10-CM | POA: Diagnosis not present

## 2016-04-16 DIAGNOSIS — J45909 Unspecified asthma, uncomplicated: Secondary | ICD-10-CM | POA: Diagnosis not present

## 2016-04-16 DIAGNOSIS — I10 Essential (primary) hypertension: Secondary | ICD-10-CM | POA: Diagnosis not present

## 2016-04-16 DIAGNOSIS — M1990 Unspecified osteoarthritis, unspecified site: Secondary | ICD-10-CM | POA: Diagnosis not present

## 2016-04-16 DIAGNOSIS — S43005D Unspecified dislocation of left shoulder joint, subsequent encounter: Secondary | ICD-10-CM | POA: Diagnosis not present

## 2016-04-16 DIAGNOSIS — M5136 Other intervertebral disc degeneration, lumbar region: Secondary | ICD-10-CM | POA: Diagnosis not present

## 2016-04-20 DIAGNOSIS — I10 Essential (primary) hypertension: Secondary | ICD-10-CM | POA: Diagnosis not present

## 2016-04-20 DIAGNOSIS — S43005D Unspecified dislocation of left shoulder joint, subsequent encounter: Secondary | ICD-10-CM | POA: Diagnosis not present

## 2016-04-20 DIAGNOSIS — J45909 Unspecified asthma, uncomplicated: Secondary | ICD-10-CM | POA: Diagnosis not present

## 2016-04-20 DIAGNOSIS — J449 Chronic obstructive pulmonary disease, unspecified: Secondary | ICD-10-CM | POA: Diagnosis not present

## 2016-04-20 DIAGNOSIS — M5136 Other intervertebral disc degeneration, lumbar region: Secondary | ICD-10-CM | POA: Diagnosis not present

## 2016-04-20 DIAGNOSIS — M1990 Unspecified osteoarthritis, unspecified site: Secondary | ICD-10-CM | POA: Diagnosis not present

## 2016-04-25 ENCOUNTER — Inpatient Hospital Stay (HOSPITAL_COMMUNITY)
Admission: EM | Admit: 2016-04-25 | Discharge: 2016-04-27 | DRG: 287 | Disposition: A | Discharge status: Home / Self Care | Payer: Medicare Other | Attending: Cardiology | Admitting: Cardiology

## 2016-04-25 ENCOUNTER — Emergency Department (HOSPITAL_COMMUNITY): Payer: Medicare Other

## 2016-04-25 ENCOUNTER — Encounter (HOSPITAL_COMMUNITY): Payer: Self-pay | Admitting: Emergency Medicine

## 2016-04-25 DIAGNOSIS — Z85828 Personal history of other malignant neoplasm of skin: Secondary | ICD-10-CM

## 2016-04-25 DIAGNOSIS — R7989 Other specified abnormal findings of blood chemistry: Secondary | ICD-10-CM | POA: Diagnosis not present

## 2016-04-25 DIAGNOSIS — Z87891 Personal history of nicotine dependence: Secondary | ICD-10-CM | POA: Diagnosis not present

## 2016-04-25 DIAGNOSIS — I5181 Takotsubo syndrome: Secondary | ICD-10-CM | POA: Diagnosis not present

## 2016-04-25 DIAGNOSIS — I481 Persistent atrial fibrillation: Secondary | ICD-10-CM | POA: Diagnosis not present

## 2016-04-25 DIAGNOSIS — Z7901 Long term (current) use of anticoagulants: Secondary | ICD-10-CM

## 2016-04-25 DIAGNOSIS — Z85118 Personal history of other malignant neoplasm of bronchus and lung: Secondary | ICD-10-CM

## 2016-04-25 DIAGNOSIS — I4891 Unspecified atrial fibrillation: Secondary | ICD-10-CM | POA: Diagnosis present

## 2016-04-25 DIAGNOSIS — J449 Chronic obstructive pulmonary disease, unspecified: Secondary | ICD-10-CM | POA: Diagnosis not present

## 2016-04-25 DIAGNOSIS — I10 Essential (primary) hypertension: Secondary | ICD-10-CM | POA: Diagnosis not present

## 2016-04-25 DIAGNOSIS — Z96641 Presence of right artificial hip joint: Secondary | ICD-10-CM | POA: Diagnosis present

## 2016-04-25 DIAGNOSIS — I214 Non-ST elevation (NSTEMI) myocardial infarction: Secondary | ICD-10-CM | POA: Diagnosis not present

## 2016-04-25 DIAGNOSIS — R0789 Other chest pain: Secondary | ICD-10-CM

## 2016-04-25 DIAGNOSIS — R778 Other specified abnormalities of plasma proteins: Secondary | ICD-10-CM

## 2016-04-25 DIAGNOSIS — Z7952 Long term (current) use of systemic steroids: Secondary | ICD-10-CM

## 2016-04-25 DIAGNOSIS — Z7951 Long term (current) use of inhaled steroids: Secondary | ICD-10-CM

## 2016-04-25 DIAGNOSIS — Z7983 Long term (current) use of bisphosphonates: Secondary | ICD-10-CM

## 2016-04-25 DIAGNOSIS — R079 Chest pain, unspecified: Secondary | ICD-10-CM | POA: Diagnosis not present

## 2016-04-25 DIAGNOSIS — Z79899 Other long term (current) drug therapy: Secondary | ICD-10-CM

## 2016-04-25 DIAGNOSIS — E785 Hyperlipidemia, unspecified: Secondary | ICD-10-CM | POA: Diagnosis not present

## 2016-04-25 HISTORY — DX: Basal cell carcinoma of skin, unspecified: C44.91

## 2016-04-25 HISTORY — DX: Non-ST elevation (NSTEMI) myocardial infarction: I21.4

## 2016-04-25 HISTORY — DX: Pneumonia, unspecified organism: J18.9

## 2016-04-25 HISTORY — DX: Unspecified osteoarthritis, unspecified site: M19.90

## 2016-04-25 HISTORY — DX: Acute embolism and thrombosis of unspecified deep veins of unspecified lower extremity: I82.409

## 2016-04-25 HISTORY — DX: Reserved for concepts with insufficient information to code with codable children: IMO0002

## 2016-04-25 LAB — I-STAT TROPONIN, ED: TROPONIN I, POC: 0.11 ng/mL — AB (ref 0.00–0.08)

## 2016-04-25 LAB — CBC
HCT: 33.2 % — ABNORMAL LOW (ref 39.0–52.0)
Hemoglobin: 10.6 g/dL — ABNORMAL LOW (ref 13.0–17.0)
MCH: 31 pg (ref 26.0–34.0)
MCHC: 31.9 g/dL (ref 30.0–36.0)
MCV: 97.1 fL (ref 78.0–100.0)
PLATELETS: 168 10*3/uL (ref 150–400)
RBC: 3.42 MIL/uL — ABNORMAL LOW (ref 4.22–5.81)
RDW: 14.7 % (ref 11.5–15.5)
WBC: 6.3 10*3/uL (ref 4.0–10.5)

## 2016-04-25 LAB — HEPARIN LEVEL (UNFRACTIONATED)
Heparin Unfractionated: 0.6 IU/mL (ref 0.30–0.70)
Heparin Unfractionated: 0.5 IU/mL (ref 0.30–0.70)

## 2016-04-25 LAB — BASIC METABOLIC PANEL
ANION GAP: 8 (ref 5–15)
BUN: 20 mg/dL (ref 6–20)
CALCIUM: 9.4 mg/dL (ref 8.9–10.3)
CO2: 29 mmol/L (ref 22–32)
CREATININE: 1.19 mg/dL (ref 0.61–1.24)
Chloride: 101 mmol/L (ref 101–111)
GFR calc Af Amer: >60 mL/min (ref >60)
GFR, EST NON AFRICAN AMERICAN: 52 mL/min — AB (ref >60)
GLUCOSE: 93 mg/dL (ref 65–99)
Potassium: 3.7 mmol/L (ref 3.5–5.1)
Sodium: 138 mmol/L (ref 135–145)

## 2016-04-25 LAB — TROPONIN I
Troponin I: 0.09 ng/mL (ref <0.03)
Troponin I: 0.27 ng/mL (ref <0.03)
Troponin I: 4.99 ng/mL (ref <0.03)

## 2016-04-25 MED ORDER — METOPROLOL TARTRATE 25 MG PO TABS
25.0000 mg | ORAL_TABLET | Freq: Two times a day (BID) | ORAL | Status: DC
  Administered 2016-04-25 – 2016-04-26 (×3): 25 mg via ORAL
  Filled 2016-04-25 (×5): qty 1

## 2016-04-25 MED ORDER — PREDNISONE 10 MG PO TABS
10.0000 mg | ORAL_TABLET | Freq: Every day | ORAL | Status: DC
  Administered 2016-04-25 – 2016-04-27 (×2): 10 mg via ORAL
  Filled 2016-04-25 (×3): qty 1

## 2016-04-25 MED ORDER — FINASTERIDE 5 MG PO TABS
10.0000 mg | ORAL_TABLET | Freq: Every day | ORAL | Status: DC
  Administered 2016-04-25 – 2016-04-27 (×3): 10 mg via ORAL
  Filled 2016-04-25 (×3): qty 2

## 2016-04-25 MED ORDER — SODIUM CHLORIDE 0.9 % WEIGHT BASED INFUSION: 1.0000 mL/kg/h | INTRAVENOUS | Status: DC

## 2016-04-25 MED ORDER — ONDANSETRON HCL 4 MG/2ML IJ SOLN: 4.0000 mg | Freq: Four times a day (QID) | INTRAMUSCULAR | Status: DC | PRN

## 2016-04-25 MED ORDER — ASPIRIN EC 81 MG PO TBEC
81.0000 mg | DELAYED_RELEASE_TABLET | Freq: Every day | ORAL | Status: DC
  Administered 2016-04-27: 81 mg via ORAL
  Filled 2016-04-25: qty 1

## 2016-04-25 MED ORDER — SODIUM CHLORIDE 0.9% FLUSH
3.0000 mL | Freq: Two times a day (BID) | INTRAVENOUS | Status: DC
  Administered 2016-04-25: 3 mL via INTRAVENOUS

## 2016-04-25 MED ORDER — NITROGLYCERIN 0.4 MG SL SUBL: 0.4000 mg | SUBLINGUAL_TABLET | SUBLINGUAL | Status: DC | PRN

## 2016-04-25 MED ORDER — SODIUM CHLORIDE 0.9% FLUSH: 3.0000 mL | INTRAVENOUS | Status: DC | PRN

## 2016-04-25 MED ORDER — ATORVASTATIN CALCIUM 80 MG PO TABS
80.0000 mg | ORAL_TABLET | Freq: Every day | ORAL | Status: DC
  Administered 2016-04-25 – 2016-04-26 (×2): 80 mg via ORAL
  Filled 2016-04-25 (×2): qty 1

## 2016-04-25 MED ORDER — LORATADINE 10 MG PO TABS
10.0000 mg | ORAL_TABLET | Freq: Every day | ORAL | Status: DC
  Administered 2016-04-25 – 2016-04-27 (×3): 10 mg via ORAL
  Filled 2016-04-25 (×3): qty 1

## 2016-04-25 MED ORDER — MONTELUKAST SODIUM 10 MG PO TABS
10.0000 mg | ORAL_TABLET | Freq: Every day | ORAL | Status: DC
  Administered 2016-04-25 – 2016-04-26 (×2): 10 mg via ORAL
  Filled 2016-04-25 (×2): qty 1

## 2016-04-25 MED ORDER — HEPARIN BOLUS VIA INFUSION
4000.0000 [IU] | Freq: Once | INTRAVENOUS | Status: AC
  Administered 2016-04-25: 4000 [IU] via INTRAVENOUS
  Filled 2016-04-25: qty 4000

## 2016-04-25 MED ORDER — ACETAMINOPHEN 325 MG PO TABS: 650.0000 mg | ORAL_TABLET | ORAL | Status: DC | PRN

## 2016-04-25 MED ORDER — DICYCLOMINE HCL 10 MG PO CAPS
10.0000 mg | ORAL_CAPSULE | Freq: Three times a day (TID) | ORAL | Status: DC
Start: 2016-04-25 — End: 2016-04-27
  Administered 2016-04-25 – 2016-04-27 (×8): 10 mg via ORAL
  Filled 2016-04-25 (×11): qty 1

## 2016-04-25 MED ORDER — ASPIRIN 300 MG RE SUPP: 300.0000 mg | RECTAL | Status: AC

## 2016-04-25 MED ORDER — SODIUM CHLORIDE 0.9 % IV SOLN: 250.0000 mL | INTRAVENOUS | Status: DC | PRN

## 2016-04-25 MED ORDER — HEPARIN (PORCINE) IN NACL 100-0.45 UNIT/ML-% IJ SOLN
1100.0000 [IU]/h | INTRAMUSCULAR | Status: DC
  Administered 2016-04-25 (×2): 1100 [IU]/h via INTRAVENOUS
  Filled 2016-04-25 (×2): qty 250

## 2016-04-25 MED ORDER — GUAIFENESIN-DM 100-10 MG/5ML PO SYRP
5.0000 mL | ORAL_SOLUTION | ORAL | Status: DC | PRN
  Administered 2016-04-25: 5 mL via ORAL
  Filled 2016-04-25: qty 5

## 2016-04-25 MED ORDER — FLUTICASONE PROPIONATE 50 MCG/ACT NA SUSP
2.0000 | Freq: Every morning | NASAL | Status: DC
  Administered 2016-04-26 – 2016-04-27 (×2): 2 via NASAL
  Filled 2016-04-25: qty 16

## 2016-04-25 MED ORDER — ASPIRIN 81 MG PO CHEW: 324.0000 mg | CHEWABLE_TABLET | ORAL | Status: AC

## 2016-04-25 MED ORDER — VERAPAMIL HCL ER 180 MG PO TBCR
180.0000 mg | EXTENDED_RELEASE_TABLET | Freq: Every evening | ORAL | Status: DC
  Filled 2016-04-25: qty 1

## 2016-04-25 MED ORDER — SODIUM CHLORIDE 0.9 % WEIGHT BASED INFUSION
3.0000 mL/kg/h | INTRAVENOUS | Status: DC
  Administered 2016-04-26: 3 mL/kg/h via INTRAVENOUS

## 2016-04-25 MED ORDER — ALBUTEROL SULFATE (2.5 MG/3ML) 0.083% IN NEBU: 2.5000 mg | INHALATION_SOLUTION | Freq: Four times a day (QID) | RESPIRATORY_TRACT | Status: DC | PRN

## 2016-04-25 MED ORDER — HYDROCODONE-ACETAMINOPHEN 5-325 MG PO TABS
1.0000 | ORAL_TABLET | ORAL | Status: DC | PRN
  Administered 2016-04-25 – 2016-04-26 (×3): 1 via ORAL
  Filled 2016-04-25 (×2): qty 1
  Filled 2016-04-25: qty 2

## 2016-04-25 MED ORDER — ASPIRIN 81 MG PO CHEW
81.0000 mg | CHEWABLE_TABLET | ORAL | Status: AC
  Administered 2016-04-26: 81 mg via ORAL
  Filled 2016-04-25: qty 1

## 2016-04-25 NOTE — ED Notes (Signed)
Attempted report 

## 2016-04-25 NOTE — ED Notes (Signed)
Notified EDP results of istat troponin

## 2016-04-25 NOTE — ED Provider Notes (Addendum)
[facility] DEPT Provider Note   CSN: [id number] Arrival date & time: 04/25/16  0428     History   Chief Complaint Chief Complaint  Patient presents with  . Chest Pain    HPI [name] is a 83 y.o. male.  HPI Pt with hx of R lung CA, AF not on anticoagulation or anti-platelet, COPD, HTN, HL comes in with cc of chest pain. Pt reports that she woke up this AM to go the bathroom, on his way back he had sudden severe chest pain. Chest pain as generalized, non radiating, and dull but severe. Pt had no associated dib, nausea, sweats. PT has had no recent cough, uri like symptoms. He has had DVT hx in the past, but had no worsening of the chest pain with breathing or dib. Pt hasnt had any ACS workup recently. Pt's pain resolved after about 20-30 min. He was given aspirin and nitro. Pt thinks the pain had already started to subside by the time he got nitro.  Past Medical History:  Diagnosis Date  . Adenocarcinoma of right lung, stage 1 ([place]) 12/11/2014  . Arthritis    osteoarthritis  . Asthma   . Atrial fibrillation ([place])   . Bowel obstruction   . Bradycardia   . Cancer ([place])    skin cancer  . COPD (chronic obstructive pulmonary disease) ([facility])    mild  . Diverticulosis of colon   . Hemorrhoids   . Hx of adenomatous colonic polyps 02/1997  . Hyperlipidemia   . Hypertension   . 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 Palpitations   . Pleural effusion   . Upper respiratory symptom 03/25/2016    Patient Active Problem List   Diagnosis Date Noted  . Upper respiratory symptom 03/25/2016  . Left arm swelling 03/25/2016  . Troponin I above reference range 03/10/2016  . Shoulder dislocation, left, initial encounter 03/10/2016  . Torsades de pointes ([place]) 03/10/2016  . Right elbow pain 12/02/2015  . Cellulitis 10/15/2015  . Pleural effusion 01/23/2015  . Acute respiratory failure with hypoxia ([place]) 01/07/2015  . PAF (paroxysmal atrial fibrillation) ([place]) 01/07/2015  . Acute  respiratory failure ([place]) 01/07/2015  . Shortness of breath 01/06/2015  . Patellar tendinitis of right knee 12/12/2014  . Adenocarcinoma of right lung, stage 1 ([place]) 12/11/2014  . Traumatic rotator cuff tear 11/07/2014  . Compression fracture of L1 lumbar vertebra ([facility]) 11/07/2014  . Lumbar degenerative disc disease 09/11/2014  . Osteoarthritis of right knee 09/11/2014  . Chronic anticoagulation 11/22/2012  . DYSLIPIDEMIA 08/06/2008  . COLONIC POLYPS, ADENOMATOUS 07/17/2008  . Essential hypertension 07/17/2008  . Atrial fibrillation ([place]) 07/17/2008  . BRADYCARDIA 07/17/2008  . HEMORRHOIDS 07/17/2008  . Asthma 07/17/2008  . DIVERTICULOSIS, COLON 07/17/2008  . PALPITATIONS 07/17/2008    Past Surgical History:  Procedure Laterality Date  . APPENDECTOMY    . COLON RESECTION    . CYSTOSCOPY    . LAMINECTOMY     Right L4-L5 Laminectomy  . ROTATOR CUFF REPAIR Right   . TOTAL HIP ARTHROPLASTY Right   . UMBILICAL HERNIA REPAIR    . VIDEO BRONCHOSCOPY WITH ENDOBRONCHIAL NAVIGATION N/A 12/18/2014   Procedure: VIDEO BRONCHOSCOPY WITH ENDOBRONCHIAL NAVIGATION ;  Surgeon: Collene Gobble, MD;  Location: [facility];  Service: Thoracic;  Laterality: N/A;       Home Medications    Prior to Admission medications   Medication Sig Start Date End Date Taking? Authorizing Provider  albuterol (PROVENTIL HFA;VENTOLIN HFA) 108 (90 Base) MCG/ACT  inhaler Inhale 1-2 puffs into the lungs every 6 (six) hours as needed for wheezing or shortness of breath.   Yes Historical Provider, MD  alendronate (FOSAMAX) 70 MG tablet Take 70 mg by mouth once a week. Take with a full glass of water on an empty stomach.   Yes Historical Provider, MD  CALCIUM PO Take 1,000 mg by mouth daily.   Yes Historical Provider, MD  cetirizine (ZYRTEC) 10 MG tablet Take 10 mg by mouth every evening.    Yes Historical Provider, MD  Cholecalciferol (VITAMIN D PO) Take 3 capsules by mouth daily.   Yes Historical Provider, MD  dicyclomine  (BENTYL) 10 MG capsule Take 1 capsule (10 mg total) by mouth 4 (four) times daily -  before meals and at bedtime. 03/08/16  Yes Ladene Artist, MD  finasteride (PROSCAR) 5 MG tablet Take 10 mg by mouth daily.    Yes Historical Provider, MD  fluticasone (FLONASE) 50 MCG/ACT nasal spray Place 2 sprays into both nostrils every morning.   Yes Historical Provider, MD  Fluticasone-Salmeterol (ADVAIR) 250-50 MCG/DOSE AEPB Inhale 1 puff into the lungs 2 (two) times daily.   Yes Historical Provider, MD  HYDROcodone-acetaminophen (NORCO/VICODIN) 5-325 MG tablet Take 1-2 tablets by mouth every 4 (four) hours as needed for moderate pain. 03/10/16  Yes Jessica U Vann, DO  montelukast (SINGULAIR) 10 MG tablet Take 10 mg by mouth at bedtime.   Yes Historical Provider, MD  predniSONE (DELTASONE) 10 MG tablet Take 10 mg by mouth daily. 09/23/15  Yes Historical Provider, MD  Probiotic Product (PROBIOTIC PO) Take 1 capsule by mouth daily.   Yes Historical Provider, MD  temazepam (RESTORIL) 15 MG capsule Take 15-30 mg by mouth at bedtime as needed for sleep.    Yes Historical Provider, MD  Tiotropium Bromide Monohydrate (SPIRIVA RESPIMAT) 2.5 MCG/ACT AERS Inhale 2 puffs into the lungs daily.   Yes Historical Provider, MD  verapamil (COVERA HS) 180 MG (CO) 24 hr tablet Take 1 tablet (180 mg total) by mouth at bedtime. Patient taking differently: Take 180 mg by mouth every evening.  01/16/13  Yes Evans Lance, MD  doxycycline (VIBRA-TABS) 100 MG tablet Take 1 tablet (100 mg total) by mouth 2 (two) times daily. Patient not taking: Reported on 04/25/2016 03/25/16   Curt Bears, MD    Family History Family History  Problem Relation Age of Onset  . Stroke Mother 90  . Lung cancer Father 40  . Leukemia Brother   . Alzheimer's disease Sister   . Autoimmune disease Brother     Polyarteritis nodosa    Social History Social History  Substance Use Topics  . Smoking status: Former Smoker    Packs/day: 0.00     Years: 13.00    Types: Cigarettes    Quit date: 03/29/1956  . Smokeless tobacco: Never Used     Comment: smoked for 9 years in high school  . Alcohol use Yes     Comment: wine every night      Allergies   Sulfa antibiotics and Sulfonamide derivatives   Review of Systems Review of Systems  ROS 10 Systems reviewed and are negative for acute change except as noted in the HPI.     Physical Exam Updated Vital Signs BP 152/88   Pulse 67   Temp 97.7 F (36.5 C) (Oral)   Resp 17   SpO2 96%   Physical Exam  Constitutional: He is oriented to person, place, and time. He appears well-developed.  HENT:  Head: Normocephalic and atraumatic.  Eyes: Conjunctivae and EOM are normal. Pupils are equal, round, and reactive to light.  Neck: Normal range of motion. Neck supple.  Cardiovascular: Normal rate, regular rhythm and intact distal pulses.   Murmur heard. Pulmonary/Chest: Effort normal and breath sounds normal.  Abdominal: Soft. Bowel sounds are normal. He exhibits no distension. There is no tenderness. There is no rebound and no guarding.  Musculoskeletal: He exhibits no edema, tenderness or deformity.  Neurological: He is alert and oriented to person, place, and time.  Skin: Skin is warm.  Nursing note and vitals reviewed.    ED Treatments / Results  Labs (all labs ordered are listed, but only abnormal results are displayed) Labs Reviewed  BASIC METABOLIC PANEL - Abnormal; Notable for the following:       Result Value   GFR calc non Af Amer 52 (*)    All other components within normal limits  CBC - Abnormal; Notable for the following:    RBC 3.42 (*)    Hemoglobin 10.6 (*)    HCT 33.2 (*)    All other components within normal limits  TROPONIN I - Abnormal; Notable for the following:    Troponin I 0.09 (*)    All other components within normal limits  I-STAT TROPOININ, ED - Abnormal; Notable for the following:    Troponin i, poc 0.11 (*)    All other components  within normal limits  TROPONIN I  HEPARIN LEVEL (UNFRACTIONATED)    EKG  EKG Interpretation  Date/Time:  Sunday April 25 2016 04:46:33 EST Ventricular Rate:  70 PR Interval:    QRS Duration: 94 QT Interval:  443 QTC Calculation: 478 R Axis:   93 Text Interpretation:  Atrial fibrillation Anterior infarct, old No acute changes No significant change since last tracing Confirmed by Kathrynn Humble, MD, Thel[facility] Comp [phone]) on 04/25/2016 5:03:20 AM       Radiology Dg Chest 2 View  Result Date: 04/25/2016 CLINICAL DATA:  Initial evaluation for acute chest pain, history of COPD. EXAM: CHEST  2 VIEW COMPARISON:  Prior radiograph from 02/04/2015. FINDINGS: Cardiomegaly, stable from prior. Mediastinal silhouette within normal limits. Aortic atherosclerosis noted. Lungs normally inflated. Chronic coarsening of the interstitial markings with changes related COPD. Superimposed streaky perihilar linear atelectasis/scarring. There is a residual nodular density at the left perihilar region measuring 19 x 20 mm, decreased relative to most recent radiograph. No other focal infiltrates. No pulmonary edema or pleural effusion. No pneumothorax. No acute osseous abnormality. IMPRESSION: 1. COPD with bilateral perihilar scarring. 2. Interval decrease in size of superior segment left lower lobe opacity as above. 3. No other active cardiopulmonary disease. 4. Aortic atherosclerosis. Electronically Signed   By: Jeannine Boga M.D.   On: 04/25/2016 06:33    Procedures Procedures (including critical care time)  CRITICAL CARE Performed by: Varney Biles   Total critical care time: 35 minutes  Critical care time was exclusive of separately billable procedures and treating other patients.  Critical care was necessary to treat or prevent imminent or life-threatening deterioration.  Critical care was time spent personally by me on the following activities: development of treatment plan with patient and/or surrogate  as well as nursing, discussions with consultants, evaluation of patient's response to treatment, examination of patient, obtaining history from patient or surrogate, ordering and performing treatments and interventions, ordering and review of laboratory studies, ordering and review of radiographic studies, pulse oximetry and re-evaluation of patient's condition.   Medications Ordered in  ED Medications  heparin ADULT infusion 100 units/mL (25000 units/227m sodium chloride 0.45%) (1,100 Units/hr Intravenous New Bag/Given 04/25/16 0718)  heparin bolus via infusion 4,000 Units (4,000 Units Intravenous Bolus from Bag 04/25/16 0718)     Initial Impression / Assessment and Plan / ED Course  I have reviewed the triage vital signs and the nursing notes.  Pertinent labs & imaging results that were available during my care of the patient were reviewed by me and considered in my medical decision making (see chart for details).    Pt comes in with cc of chest pain. Chest pain is atypical, and has now resolved. Pain started improving on it's own it seems like, but he did get nitro. Pain not typical of ACS, GERD, Pleuritic or musculoskeletal etiology. Pt as DVT hx and has active lung CA, so PE is high on the differential. Pt has HTN, HL, he is 89 and he has had CT chest in the past that showed high calcification burden on the coronaries - so ACS is in the ddx. If trops are neg and pt is pain free, pt and I are comfortable with close outpatient f/u with Cards.  Final Clinical Impressions(s) / ED Diagnoses   Final diagnoses:  Elevated troponin  Atypical chest pain  NSTEMI (non-ST elevated myocardial infarction) ([facility]    New Prescriptions New Prescriptions   No medications on file     AVarney Biles MD 04/25/16 0[place] MD 04/25/16 [id number]   AVarney Biles MD 04/25/16 [phone]

## 2016-04-25 NOTE — Progress Notes (Signed)
[place] for Heparin Indication: chest pain/ACS  Allergies  Allergen Reactions  . Sulfa Antibiotics Other (See Comments)    Unsure of reaction  . Sulfonamide Derivatives Other (See Comments)    Unk    Patient Measurements: Height: '5\' 9"'$  (175.3 cm) Weight: 178 lb 5.6 oz (80.9 kg) IBW/kg (Calculated) : 70.7  Heparin Dosing Weight: 81 kg  Vital Signs: Temp: 98.4 F (36.9 C) (01/28 2223) Temp Source: Oral (01/28 2223) BP: 134/62 (01/28 2223) Pulse Rate: 78 (01/28 2223)  Labs:  Recent Labs  04/25/16 0459  04/25/16 0905 04/25/16 1456 04/25/16 2004 04/25/16 2224  HGB 10.6*  --   --   --   --   --   HCT 33.2*  --   --   --   --   --   PLT 168  --   --   --   --   --   HEPARINUNFRC  --   --   --  0.60  --  0.50  CREATININE 1.19  --   --   --   --   --   TROPONINI  --   < > 0.27* 3.03* 4.99*  --   < > = values in this interval not displayed.  Estimated Creatinine Clearance: 42.1 mL/min (by C-G formula based on SCr of 1.19 mg/dL).   Assessment: 83 y.o. M presents with CP. Troponins elevated. Pharmacy consulted to dose heparin for r/o ACS.  Heparin level remains therapeutic tonight at 0.5 units/mL. No bleeding noted.  Goal of Therapy:  Heparin level 0.3-0.7 units/ml Monitor platelets by anticoagulation protocol: Yes   Plan:  Continue Heparin gtt at 1100 units/hr Daily heparin level and CBC, monitor for s/sx bleeding.    Gionni Vaca D. Safiatou Islam, PharmD, BCPS Clinical Pharmacist Pager: [phone] 04/25/2016 11:15 PM

## 2016-04-25 NOTE — Progress Notes (Signed)
ANTICOAGULATION CONSULT NOTE - Initial Consult  Pharmacy Consult for Heparin Indication: chest pain/ACS  Allergies  Allergen Reactions  . Sulfa Antibiotics Other (See Comments)    Unsure of reaction  . Sulfonamide Derivatives Other (See Comments)    Unk    Patient Measurements: Height: '5\' 9"'$  (175.3 cm) Weight: 178 lb 5.6 oz (80.9 kg) IBW/kg (Calculated) : 70.7  Ht: 68 in  Wt: 81 kg  IBW: 68 kg Heparin Dosing Weight: 81 kg  Vital Signs: Temp: 98 F (36.7 C) (01/28 1354) Temp Source: Oral (01/28 0959) BP: 134/68 (01/28 1354) Pulse Rate: 69 (01/28 1354)  Labs:  Recent Labs  04/25/16 0459 04/25/16 0536 04/25/16 0905 04/25/16 1456  HGB 10.6*  --   --   --   HCT 33.2*  --   --   --   PLT 168  --   --   --   HEPARINUNFRC  --   --   --  0.60  CREATININE 1.19  --   --   --   TROPONINI  --  0.09* 0.27*  --     Estimated Creatinine Clearance: 42.1 mL/min (by C-G formula based on SCr of 1.19 mg/dL).   Medical History: Past Medical History:  Diagnosis Date  . Adenocarcinoma of right lung, stage 1 ([place]) 12/11/2014  . Arthritis    osteoarthritis  . Asthma   . Atrial fibrillation ([place])   . Bowel obstruction   . Bradycardia   . Cancer ([place])    skin cancer  . COPD (chronic obstructive pulmonary disease) ([facility])    mild  . Diverticulosis of colon   . Hemorrhoids   . Hx of adenomatous colonic polyps 02/1997  . Hyperlipidemia   . Hypertension   . 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[place])   . Palpitations   . Pleural effusion   . Upper respiratory symptom 03/25/2016    Assessment: 83 y.o. M presents with CP. Troponins elevated. Pharmacy consulted to dose heparin for r/o ACS. Hgb 10.6, PLTC 168.  No bleeding noted.   Heparin level therapeutic x1 at 0.6  Goal of Therapy:  Heparin level 0.3-0.7 units/ml Monitor platelets by anticoagulation protocol: Yes   Plan:  Continue Heparin gtt at 1100 units/hr Will f/u confirmatory heparin level in 8 hours ( '@2300'$ ) Daily heparin level and  CBC, monitor for s/sx bleeding.   Carlean Jews, Pharm.D. PGY1 Pharmacy Resident 1/28/20183:50 PM Pager [phone]

## 2016-04-25 NOTE — Progress Notes (Signed)
ANTICOAGULATION CONSULT NOTE - Initial Consult  Pharmacy Consult for Heparin Indication: chest pain/ACS  Allergies  Allergen Reactions  . Sulfa Antibiotics Other (See Comments)    Unsure of reaction  . Sulfonamide Derivatives Other (See Comments)    Unk    Patient Measurements:    Ht: 68 in  Wt: 81 kg  IBW: 68 kg Heparin Dosing Weight: 81 kg  Vital Signs: Temp: 97.8 F (36.6 C) (01/28 0447) BP: 136/73 (01/28 0600) Pulse Rate: 61 (01/28 0600)  Labs:  Recent Labs  04/25/16 0459 04/25/16 0536  HGB 10.6*  --   HCT 33.2*  --   PLT 168  --   CREATININE 1.19  --   TROPONINI  --  0.09*    CrCl cannot be calculated (Unknown ideal weight.).   Medical History: Past Medical History:  Diagnosis Date  . Adenocarcinoma of right lung, stage 1 ([place]) 12/11/2014  . Arthritis    osteoarthritis  . Asthma   . Atrial fibrillation ([place])   . Bowel obstruction   . Bradycardia   . Cancer ([place])    skin cancer  . COPD (chronic obstructive pulmonary disease) (H[facility])    mild  . Diverticulosis of colon   . Hemorrhoids   . Hx of adenomatous colonic polyps 02/1997  . Hyperlipidemia   . Hypertension   . 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 Palpitations   . Pleural effusion   . Upper respiratory symptom 03/25/2016    Medications:  See electronic med rec  Assessment: 83 y.o. M presents with CP. To begin heparin for r/o ACS. CBC ok on admission.  Goal of Therapy:  Heparin level 0.3-0.7 units/ml Monitor platelets by anticoagulation protocol: Yes   Plan:  Heparin IV bolus 4000 units Heparin gtt at 1100 units/hr Will f/u heparin level in 8 hours Daily heparin level and CBC  Sherlon Handing, PharmD, BCPS Clinical pharmacist, pager [phone] 04/25/2016,6:48 AM

## 2016-04-25 NOTE — H&P (Signed)
Patient ID: [name] MRN: [id number], DOB/AGE: 1924-09-20   Admit date: 04/25/2016   Primary Physician: [name] Redwood, MD Primary Cardiologist: Dr. Lovena [name]  Pt. Profile:  [name] is a pleasant 83 yo Caucasian male with PMH of Chronic atrial fibrillation not on systemic anticoagulation therapy, LLL NSCC of lung s/p cryoablation at [facility], COPD, HTN and HLD presented with one episode of chest pain  Problem List  Past Medical History:  Diagnosis Date  . Adenocarcinoma of right lung, stage 1 ([place]) 12/11/2014  . Arthritis    osteoarthritis  . Asthma   . Atrial fibrillation ([place])   . Bowel obstruction   . Bradycardia   . Cancer ([place])    skin cancer  . COPD (chronic obstructive pulmonary disease) (HCC)    mild  . Diverticulosis of colon   . Hemorrhoids   . Hx of adenomatous colonic polyps 02/1997  . Hyperlipidemia   . Hypertension   . 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 Palpitations   . Pleural effusion   . Upper respiratory symptom 03/25/2016    Past Surgical History:  Procedure Laterality Date  . APPENDECTOMY    . COLON RESECTION    . CYSTOSCOPY    . LAMINECTOMY     Right L4-L5 Laminectomy  . ROTATOR CUFF REPAIR Right   . TOTAL HIP ARTHROPLASTY Right   . UMBILICAL HERNIA REPAIR    . VIDEO BRONCHOSCOPY WITH ENDOBRONCHIAL NAVIGATION N/A 12/18/2014   Procedure: VIDEO BRONCHOSCOPY WITH ENDOBRONCHIAL NAVIGATION ;  Surgeon: [name] Gobble, MD;  Location: [facility] OR;  Service: Thoracic;  Laterality: N/A;     Allergies  Allergies  Allergen Reactions  . Sulfa Antibiotics Other (See Comments)    Unsure of reaction  . 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 According to the ED note, he had a history of DVT, however I am unable to locate such record. He was previously  seen by Dr. Lovena [name] in 2015. He was still on 2.5 mg twice a day of eliquis at the time. He was supposed to be taking 5 mg twice a day, however the dosage was later reduced to 2.5 mg twice a day due to epistaxis. He has not been seen by cardiology service since then. In 2016, he was diagnosed with lung cancer. According to the patient, he had known decreased pulmonary function prior to that, so between lobectomy versus other treatments, he eventually decided to pursue cryoablation at [facility] on 01/02/2015. Postoperative course was complicated by at least 2 admissions for recurrent hemothorax treated by thoracentesis. He also had a discussion with his PCP, Dr. Brigitte [name], eventually formed agreement that with his age, comorbidities, and a history of fall, eliquis will offer more risk than benefit. Besides essentially permanent atrial fibrillation, he has not had any cardiac history otherwise. He has known coronary calcification on CT of the chest. Otherwise, he has been doing well at his baseline. Unfortunately his wife passed away 11 days ago, he just attended the funeral service last Thursday. Therefore for the past several weeks, he has been under tremendous amount of stress.  This morning around 4 AM, he got up to go to the bathroom. On his way back to the bed, he started having substernal chest pain radiating to the left  shoulder. He denies any shortness of breath, diaphoresis or dizziness. He denies any recent fever, chill, or cough. He does have bilateral ankle edema which has been persistent for the past several month. After 10-15 minutes, he decided to call EMS, by the time EMS arrived at [facility], his chest pain has completely went away and has not recurred since. The total duration of chest pain this morning was roughly 25 minutes. Prior to this morning, he denies any exertional chest discomfort, however also admits that he does not usually exert himself. Initial laboratory finding include troponin of  0.09. Hemoglobin of 10.6. EKG showed atrial fibrillation without significant ST changes. Chest x-ray showed no acute cardiopulmonary disease.   Home Medications  Prior to Admission medications   Medication Sig Start Date End Date Taking? Authorizing Provider  albuterol (PROVENTIL HFA;VENTOLIN HFA) 108 (90 Base) MCG/ACT inhaler Inhale 1-2 puffs into the lungs every 6 (six) hours as needed for wheezing or shortness of breath.   Yes Historical Provider, MD  alendronate (FOSAMAX) 70 MG tablet Take 70 mg by mouth once a week. Take with a full glass of water on an empty stomach.   Yes Historical Provider, MD  CALCIUM PO Take 1,000 mg by mouth daily.   Yes Historical Provider, MD  cetirizine (ZYRTEC) 10 MG tablet Take 10 mg by mouth every evening.    Yes Historical Provider, MD  Cholecalciferol (VITAMIN D PO) Take 3 capsules by mouth daily.   Yes Historical Provider, MD  dicyclomine (BENTYL) 10 MG capsule Take 1 capsule (10 mg total) by mouth 4 (four) times daily -  before meals and at bedtime. 03/08/16  Yes [name] Artist, MD  finasteride (PROSCAR) 5 MG tablet Take 10 mg by mouth daily.    Yes Historical Provider, MD  fluticasone (FLONASE) 50 MCG/ACT nasal spray Place 2 sprays into both nostrils every morning.   Yes Historical Provider, MD  Fluticasone-Salmeterol (ADVAIR) 250-50 MCG/DOSE AEPB Inhale 1 puff into the lungs 2 (two) times daily.   Yes Historical Provider, MD  HYDROcodone-acetaminophen (NORCO/VICODIN) 5-325 MG tablet Take 1-2 tablets by mouth every 4 (four) hours as needed for moderate pain. 03/10/16  Yes [name] U Vann, DO  montelukast (SINGULAIR) 10 MG tablet Take 10 mg by mouth at bedtime.   Yes Historical Provider, MD  predniSONE (DELTASONE) 10 MG tablet Take 10 mg by mouth daily. 09/23/15  Yes Historical Provider, MD  Probiotic Product (PROBIOTIC PO) Take 1 capsule by mouth daily.   Yes Historical Provider, MD  temazepam (RESTORIL) 15 MG capsule Take 15-30 mg by mouth at bedtime as  needed for sleep.    Yes Historical Provider, MD  Tiotropium Bromide Monohydrate (SPIRIVA RESPIMAT) 2.5 MCG/ACT AERS Inhale 2 puffs into the lungs daily.   Yes Historical Provider, MD  verapamil (COVERA HS) 180 MG (CO) 24 hr tablet Take 1 tablet (180 mg total) by mouth at bedtime. Patient taking differently: Take 180 mg by mouth every evening.  01/16/13  Yes Evans Lance, MD  doxycycline (VIBRA-TABS) 100 MG tablet Take 1 tablet (100 mg total) by mouth 2 (two) times daily. Patient not taking: Reported on 04/25/2016 03/25/16   Curt Bears, MD    Family History  Family History  Problem Relation Age of Onset  . Stroke Mother 2  . Lung cancer Father 6  . Leukemia Brother   . Alzheimer's disease Sister   . Autoimmune disease Brother     Polyarteritis nodosa    Social History  Social History   Social History  . Marital status: Married    Spouse name: N/A  . Number of children: N/A  . Years of education: N/A   Occupational History  . retired physician    Social History Main Topics  . Smoking status: Former Smoker    Packs/day: 0.00    Years: 13.00    Types: Cigarettes    Quit date: 03/29/1956  . Smokeless tobacco: Never Used  . Alcohol use Yes     Comment: wine every night   . Drug use: No  . Sexual activity: Not on file   Other Topics Concern  . Not on file   Social History Narrative   Retired physician of [employer]:  No chills, fever, night sweats or weight changes.  Cardiovascular:  No dyspnea on exertion, edema, orthopnea, palpitations, paroxysmal nocturnal dyspnea. +chest pain Dermatological: No rash, lesions/masses Respiratory: No cough, dyspnea Urologic: No hematuria, dysuria Abdominal:   No nausea, vomiting, diarrhea, bright red blood per rectum, melena, or hematemesis Neurologic:  No visual changes, wkns, changes in mental status. All other systems reviewed and are otherwise negative except as noted  above.  Physical Exam  Blood pressure 146/80, [name] 68, temperature 97.7 F (36.5 C), temperature source Oral, resp. rate 15, SpO2 98 %.  General: Pleasant, NAD Psych: Normal affect. Neuro: Alert and oriented X 3. Moves all extremities spontaneously. HEENT: Normal  Neck: Supple without bruits or JVD. Lungs:  Resp regular and unlabored. +diffuse rare crackle not tied to the base of the lung Heart: irregularly irregular. no s3, s4, or murmurs. Abdomen: Soft, non-tender, non-distended, BS + x 4.  Extremities: No clubbing, cyanosis. DP/PT/Radials 2+ and equal bilaterally. 2+ ankle edema bilaterally  Labs  Troponin (Point of Care Test)  Recent Labs  04/25/16 0510  TROPIPOC 0.11*    Recent Labs  04/25/16 0536  TROPONINI 0.09*   Lab Results  Component Value Date   WBC 6.3 04/25/2016   HGB 10.6 (L) 04/25/2016   HCT 33.2 (L) 04/25/2016   MCV 97.1 04/25/2016   PLT 168 04/25/2016    Recent Labs Lab 04/25/16 0459  NA 138  K 3.7  CL 101  CO2 29  BUN 20  CREATININE 1.19  CALCIUM 9.4  GLUCOSE 93   Lab Results  Component Value Date   CHOL  07/25/2007    65        ATP III CLASSIFICATION:  <200     mg/dL   Desirable  200-239  mg/dL   Borderline High  >=240    mg/dL   High   TRIG 44 07/25/2007   No results found for: DDIMER   Radiology/Studies  Dg Chest 2 View  Result Date: 04/25/2016 CLINICAL DATA:  Initial evaluation for acute chest pain, history of COPD. EXAM: CHEST  2 VIEW COMPARISON:  Prior radiograph from 02/04/2015. FINDINGS: Cardiomegaly, stable from prior. Mediastinal silhouette within normal limits. Aortic atherosclerosis noted. Lungs normally inflated. Chronic coarsening of the interstitial markings with changes related COPD. Superimposed streaky perihilar linear atelectasis/scarring. There is a residual nodular density at the left perihilar region measuring 19 x 20 mm, decreased relative to most recent radiograph. No other focal infiltrates. No pulmonary  edema or pleural effusion. No pneumothorax. No acute osseous abnormality. IMPRESSION: 1. COPD with bilateral perihilar scarring. 2. Interval decrease in size of superior segment left lower lobe opacity as above. 3. No other active cardiopulmonary disease. 4. Aortic atherosclerosis. Electronically  Signed   By: Jeannine Boga M.D.   On: 04/25/2016 06:33    ECG  Atrial fibrillation with no significant ST segment changes  Echocardiogram 01/07/2015  - Left ventricle: The cavity size was normal. There was moderate   concentric hypertrophy. Systolic function was vigorous. The   estimated ejection fraction was in the range of 65% to 70%. Wall   motion was normal; there were no regional wall motion   abnormalities. The study is not technically sufficient to allow   evaluation of LV diastolic function. - Aortic valve: There was trivial regurgitation. - Mitral valve: There was mild regurgitation. - Left atrium: The atrium was moderately dilated. - Right atrium: The atrium was mildly dilated.    ASSESSMENT AND PLAN  1. Chest pain with elevated troponin: Patient has known coronary calcification on his previous CT of the chest. He only has mild troponin evaluation and this is a single episode of chest pain. Will discuss up with M.D., potentially trend troponin overnight for observation. Depending on if troponin continued to go up or not, may decide between stress test versus cardiac catheterization, otherwise if trop flat, would recommend medical therapy instead unless recurrence. Despite the rare crackle on exam, I do not think he is significantly volume overloaded, I think crackle like come from his pulmonary disease  2. LLL non-small carcinoma of the lung underwent cryoablation [facility] in October 2016: followed by Dr. Janeice Robinson Radiation oncology  3. HTN: BP stable.   4. HLD not on statin  5. COPD: Surprisingly only smoked for 8 years after high school.   Signed, Almyra Deforest, PA-C 04/25/2016, 8:06  AM  As above, patient seen and examined. Briefly he is an 83 year old male with past medical history of permanent atrial fibrillation, left lower lobe non-small cell lung cancer status post cryoablation, COPD, hypertension and hyperlipidemia with chest pain. Patient recently lost his spouse of 68 years. This morning the patient was ambulating to the bathroom and developed left-sided chest pain radiating to his left shoulder. There was no associated symptoms. The pain was described as a dull sensation. It lasted 30 minutes and resolved. Not pleuritic. Presently pain-free. Electrocardiogram shows atrial fibrillation and prior septal infarct cannot be excluded. Troponin minimally elevated at 0.11 and 0.09.  Patient has ruled in for non-ST elevation myocardial infarction. Presently pain-free. I discussed treatment strategies with him including aggressiveness of care. He is active and would be agreeable to PCI if indicated. He would not be a candidate for coronary artery bypass and graft given his age and other medical issues. Will treat with aspirin, heparin and statin. Would discontinue verapamil and treat with metoprolol for his infarct and atrial fibrillation. Plan to proceed with cardiac catheterization to see if there is a lesion amenable to PCI. The risks and benefits were discussed including myocardial infarction, CVA and death and he agrees to proceed.  For his atrial fibrillation I will change his verapamil to metoprolol. CHADSvasc likely 3 for age and newly diagnosed CAD. I explained that he should be on anticoagulation long-term to decrease the risk of CVA. He would like to consider this before initiating. He has not been on anticoagulation at home.  Kirk Ruths, MD

## 2016-04-25 NOTE — ED Notes (Signed)
Blood collected by phleb T Allred

## 2016-04-25 NOTE — ED Triage Notes (Signed)
Pt presents with centralized 6/10 chest pain today.  He went to the bathroom at 0300.  He took oxycodone with relief. No nausea, vomiting, SOB.  Recently dx with lung cancer.  EMS placed him on 4L [place], gave 324 mg ASA, and 1 dose of nitro.  20 G L  Forearm.

## 2016-04-25 NOTE — ED Notes (Signed)
Attempted report x 2 

## 2016-04-26 ENCOUNTER — Encounter (HOSPITAL_COMMUNITY): Payer: Self-pay | Admitting: General Practice

## 2016-04-26 ENCOUNTER — Encounter (HOSPITAL_COMMUNITY)
Admission: EM | Disposition: A | Discharge status: Home / Self Care | Payer: Self-pay | Source: Home / Self Care | Attending: Cardiology

## 2016-04-26 DIAGNOSIS — I10 Essential (primary) hypertension: Secondary | ICD-10-CM

## 2016-04-26 DIAGNOSIS — I481 Persistent atrial fibrillation: Secondary | ICD-10-CM | POA: Diagnosis present

## 2016-04-26 DIAGNOSIS — I379 Nonrheumatic pulmonary valve disorder, unspecified: Secondary | ICD-10-CM | POA: Diagnosis not present

## 2016-04-26 DIAGNOSIS — Z7952 Long term (current) use of systemic steroids: Secondary | ICD-10-CM | POA: Diagnosis not present

## 2016-04-26 DIAGNOSIS — J449 Chronic obstructive pulmonary disease, unspecified: Secondary | ICD-10-CM | POA: Diagnosis present

## 2016-04-26 DIAGNOSIS — Z85828 Personal history of other malignant neoplasm of skin: Secondary | ICD-10-CM | POA: Diagnosis not present

## 2016-04-26 DIAGNOSIS — R0789 Other chest pain: Secondary | ICD-10-CM | POA: Diagnosis not present

## 2016-04-26 DIAGNOSIS — Z85118 Personal history of other malignant neoplasm of bronchus and lung: Secondary | ICD-10-CM | POA: Diagnosis not present

## 2016-04-26 DIAGNOSIS — Z7983 Long term (current) use of bisphosphonates: Secondary | ICD-10-CM | POA: Diagnosis not present

## 2016-04-26 DIAGNOSIS — I5181 Takotsubo syndrome: Secondary | ICD-10-CM | POA: Diagnosis present

## 2016-04-26 DIAGNOSIS — E785 Hyperlipidemia, unspecified: Secondary | ICD-10-CM | POA: Diagnosis not present

## 2016-04-26 DIAGNOSIS — I482 Chronic atrial fibrillation: Secondary | ICD-10-CM

## 2016-04-26 DIAGNOSIS — Z87891 Personal history of nicotine dependence: Secondary | ICD-10-CM | POA: Diagnosis not present

## 2016-04-26 DIAGNOSIS — Z7951 Long term (current) use of inhaled steroids: Secondary | ICD-10-CM | POA: Diagnosis not present

## 2016-04-26 DIAGNOSIS — I214 Non-ST elevation (NSTEMI) myocardial infarction: Secondary | ICD-10-CM | POA: Diagnosis not present

## 2016-04-26 DIAGNOSIS — Z96641 Presence of right artificial hip joint: Secondary | ICD-10-CM | POA: Diagnosis present

## 2016-04-26 DIAGNOSIS — Z79899 Other long term (current) drug therapy: Secondary | ICD-10-CM | POA: Diagnosis not present

## 2016-04-26 HISTORY — PX: CARDIAC CATHETERIZATION: SHX172

## 2016-04-26 LAB — BASIC METABOLIC PANEL
Anion gap: 8 (ref 5–15)
BUN: 14 mg/dL (ref 6–20)
CALCIUM: 9.1 mg/dL (ref 8.9–10.3)
CO2: 27 mmol/L (ref 22–32)
CREATININE: 0.91 mg/dL (ref 0.61–1.24)
Chloride: 101 mmol/L (ref 101–111)
GFR calc non Af Amer: >60 mL/min (ref >60)
Glucose, Bld: 83 mg/dL (ref 65–99)
Potassium: 3.6 mmol/L (ref 3.5–5.1)
Sodium: 136 mmol/L (ref 135–145)

## 2016-04-26 LAB — HEPARIN LEVEL (UNFRACTIONATED): HEPARIN UNFRACTIONATED: 0.5 [IU]/mL (ref 0.30–0.70)

## 2016-04-26 LAB — TROPONIN I
TROPONIN I: 3.88 ng/mL — AB (ref <0.03)
Troponin I: 3.03 ng/mL (ref <0.03)

## 2016-04-26 LAB — CBC
HEMATOCRIT: 34.3 % — AB (ref 39.0–52.0)
Hemoglobin: 10.9 g/dL — ABNORMAL LOW (ref 13.0–17.0)
MCH: 30.4 pg (ref 26.0–34.0)
MCHC: 31.8 g/dL (ref 30.0–36.0)
MCV: 95.5 fL (ref 78.0–100.0)
Platelets: 169 10*3/uL (ref 150–400)
RBC: 3.59 MIL/uL — ABNORMAL LOW (ref 4.22–5.81)
RDW: 14.4 % (ref 11.5–15.5)
WBC: 8.4 10*3/uL (ref 4.0–10.5)

## 2016-04-26 LAB — PROTIME-INR
INR: 1.27
Prothrombin Time: 16 seconds — ABNORMAL HIGH (ref 11.4–15.2)

## 2016-04-26 LAB — GLUCOSE, CAPILLARY: Glucose-Capillary: 101 mg/dL — ABNORMAL HIGH (ref 65–99)

## 2016-04-26 LAB — POCT ACTIVATED CLOTTING TIME: ACTIVATED CLOTTING TIME: 136 s

## 2016-04-26 SURGERY — CORONARY/GRAFT ANGIOGRAPHY

## 2016-04-26 MED ORDER — SODIUM CHLORIDE 0.9 % IV SOLN
INTRAVENOUS | Status: AC
  Administered 2016-04-26: 17:00:00 via INTRAVENOUS

## 2016-04-26 MED ORDER — MORPHINE SULFATE (PF) 4 MG/ML IV SOLN: 2.0000 mg | INTRAVENOUS | Status: DC | PRN

## 2016-04-26 MED ORDER — IOPAMIDOL (ISOVUE-370) INJECTION 76%
INTRAVENOUS | Status: AC
  Filled 2016-04-26: qty 100

## 2016-04-26 MED ORDER — SODIUM CHLORIDE 0.9 % IV SOLN: 250.0000 mL | INTRAVENOUS | Status: DC | PRN

## 2016-04-26 MED ORDER — LIDOCAINE HCL (PF) 1 % IJ SOLN
INTRAMUSCULAR | Status: AC
  Filled 2016-04-26: qty 30

## 2016-04-26 MED ORDER — MIDAZOLAM HCL 2 MG/2ML IJ SOLN
INTRAMUSCULAR | Status: DC | PRN
  Administered 2016-04-26: 1 mg via INTRAVENOUS

## 2016-04-26 MED ORDER — SODIUM CHLORIDE 0.9% FLUSH
3.0000 mL | Freq: Two times a day (BID) | INTRAVENOUS | Status: DC
  Administered 2016-04-26 – 2016-04-27 (×2): 3 mL via INTRAVENOUS

## 2016-04-26 MED ORDER — ONDANSETRON HCL 4 MG/2ML IJ SOLN: 4.0000 mg | Freq: Four times a day (QID) | INTRAMUSCULAR | Status: DC | PRN

## 2016-04-26 MED ORDER — HEPARIN (PORCINE) IN NACL 2-0.9 UNIT/ML-% IJ SOLN
INTRAMUSCULAR | Status: AC
  Filled 2016-04-26: qty 1000

## 2016-04-26 MED ORDER — LIDOCAINE HCL (PF) 1 % IJ SOLN
INTRAMUSCULAR | Status: DC | PRN
  Administered 2016-04-26: 20 mL

## 2016-04-26 MED ORDER — SODIUM CHLORIDE 0.9% FLUSH: 3.0000 mL | INTRAVENOUS | Status: DC | PRN

## 2016-04-26 MED ORDER — FENTANYL CITRATE (PF) 100 MCG/2ML IJ SOLN
INTRAMUSCULAR | Status: AC
  Filled 2016-04-26: qty 2

## 2016-04-26 MED ORDER — MIDAZOLAM HCL 2 MG/2ML IJ SOLN
INTRAMUSCULAR | Status: AC
  Filled 2016-04-26: qty 2

## 2016-04-26 MED ORDER — IOPAMIDOL (ISOVUE-370) INJECTION 76%
INTRAVENOUS | Status: DC | PRN
  Administered 2016-04-26: 30 mL via INTRA_ARTERIAL

## 2016-04-26 MED ORDER — ACETAMINOPHEN 325 MG PO TABS: 650.0000 mg | ORAL_TABLET | ORAL | Status: DC | PRN

## 2016-04-26 MED ORDER — HYDRALAZINE HCL 20 MG/ML IJ SOLN: 10.0000 mg | INTRAMUSCULAR | Status: DC | PRN

## 2016-04-26 MED ORDER — HEPARIN (PORCINE) IN NACL 2-0.9 UNIT/ML-% IJ SOLN
INTRAMUSCULAR | Status: DC | PRN
  Administered 2016-04-26: 1500 mL

## 2016-04-26 MED ORDER — FENTANYL CITRATE (PF) 100 MCG/2ML IJ SOLN
INTRAMUSCULAR | Status: DC | PRN
  Administered 2016-04-26: 25 ug via INTRAVENOUS

## 2016-04-26 MED ORDER — ASPIRIN 81 MG PO CHEW: 81.0000 mg | CHEWABLE_TABLET | Freq: Every day | ORAL | Status: DC

## 2016-04-26 SURGICAL SUPPLY — 10 items
CATH INFINITI 5FR MULTPACK ANG (CATHETERS) ×2 IMPLANT
CATH TEMPO AQUA 5F 100CM (CATHETERS) ×2 IMPLANT
KIT HEART LEFT (KITS) ×3 IMPLANT
PACK CARDIAC CATHETERIZATION (CUSTOM PROCEDURE TRAY) ×3 IMPLANT
SHEATH PINNACLE 5F 10CM (SHEATH) ×2 IMPLANT
SHEATH PINNACLE 6F 10CM (SHEATH) ×2 IMPLANT
SHEATH PINNACLE ST 6F 45CM (SHEATH) ×2 IMPLANT
TRANSDUCER W/STOPCOCK (MISCELLANEOUS) ×3 IMPLANT
WIRE EMERALD 3MM-J .035X150CM (WIRE) ×2 IMPLANT
WIRE ROSEN-J .035X260CM (WIRE) ×2 IMPLANT

## 2016-04-26 NOTE — H&P (View-Only) (Signed)
DAILY PROGRESS NOTE  Subjective:  No events overnight. Troponin peak ad 4.99. Plan for LHC today.   Objective:  Temp:  [98 F (36.7 C)-98.4 F (36.9 C)] 98 F (36.7 C) (01/29 0442) Pulse Rate:  [64-78] 64 (01/29 0442) Resp:  [18-19] 18 (01/29 0442) BP: (127-134)/(62-88) 127/74 (01/29 0442) SpO2:  [94 %-96 %] 94 % (01/29 0442) Weight change:   Intake/Output from previous day: 01/28 0701 - 01/29 0700 In: 360 [P.O.:360] Out: 1100 [Urine:1100]  Intake/Output from this shift: No intake/output data recorded.  Medications: No current facility-administered medications on file prior to encounter.    Current Outpatient Prescriptions on File Prior to Encounter  Medication Sig Dispense Refill  . albuterol (PROVENTIL HFA;VENTOLIN HFA) 108 (90 Base) MCG/ACT inhaler Inhale 1-2 puffs into the lungs every 6 (six) hours as needed for wheezing or shortness of breath.    Marland Kitchen alendronate (FOSAMAX) 70 MG tablet Take 70 mg by mouth once a week. Take with a full glass of water on an empty stomach.    Marland Kitchen CALCIUM PO Take 1,000 mg by mouth daily.    . cetirizine (ZYRTEC) 10 MG tablet Take 10 mg by mouth every evening.     . Cholecalciferol (VITAMIN D PO) Take 3 capsules by mouth daily.    Marland Kitchen dicyclomine (BENTYL) 10 MG capsule Take 1 capsule (10 mg total) by mouth 4 (four) times daily -  before meals and at bedtime. 120 capsule 5  . finasteride (PROSCAR) 5 MG tablet Take 10 mg by mouth daily.     . fluticasone (FLONASE) 50 MCG/ACT nasal spray Place 2 sprays into both nostrils every morning.    . Fluticasone-Salmeterol (ADVAIR) 250-50 MCG/DOSE AEPB Inhale 1 puff into the lungs 2 (two) times daily.    Marland Kitchen HYDROcodone-acetaminophen (NORCO/VICODIN) 5-325 MG tablet Take 1-2 tablets by mouth every 4 (four) hours as needed for moderate pain. 30 tablet 0  . montelukast (SINGULAIR) 10 MG tablet Take 10 mg by mouth at bedtime.    . predniSONE (DELTASONE) 10 MG tablet Take 10 mg by mouth daily.    . Probiotic  Product (PROBIOTIC PO) Take 1 capsule by mouth daily.    . temazepam (RESTORIL) 15 MG capsule Take 15-30 mg by mouth at bedtime as needed for sleep.     . Tiotropium Bromide Monohydrate (SPIRIVA RESPIMAT) 2.5 MCG/ACT AERS Inhale 2 puffs into the lungs daily.    . verapamil (COVERA HS) 180 MG (CO) 24 hr tablet Take 1 tablet (180 mg total) by mouth at bedtime. (Patient taking differently: Take 180 mg by mouth every evening. ) 30 tablet 5  . doxycycline (VIBRA-TABS) 100 MG tablet Take 1 tablet (100 mg total) by mouth 2 (two) times daily. (Patient not taking: Reported on 04/25/2016) 20 tablet 0  . [DISCONTINUED] beclomethasone (QVAR) 80 MCG/ACT inhaler Inhale 2 puffs into the lungs 2 (two) times daily.      Physical Exam: General appearance: alert and no distress Neck: no carotid bruit and no JVD Lungs: clear to auscultation bilaterally Heart: regular rate and rhythm, S1, S2 normal, no murmur, click, rub or gallop Abdomen: soft, non-tender; bowel sounds normal; no masses,  no organomegaly Extremities: extremities normal, atraumatic, no cyanosis or edema Pulses: 2+ and symmetric Skin: Skin color, texture, turgor normal. No rashes or lesions Neurologic: Grossly normal Psych: Pleasant  Lab Results: Results for orders placed or performed during the hospital encounter of 04/25/16 (from the past 48 hour(s))  Basic metabolic panel  Status: Abnormal   Collection Time: 04/25/16  4:59 AM  Result Value Ref Range   Sodium 138 135 - 145 mmol/L   Potassium 3.7 3.5 - 5.1 mmol/L   Chloride 101 101 - 111 mmol/L   CO2 29 22 - 32 mmol/L   Glucose, Bld 93 65 - 99 mg/dL   BUN 20 6 - 20 mg/dL   Creatinine, Ser 1.19 0.61 - 1.24 mg/dL   Calcium 9.4 8.9 - 10.3 mg/dL   GFR calc non Af Amer 52 (L) >60 mL/min   GFR calc Af Amer >60 >60 mL/min    Comment: (NOTE) The eGFR has been calculated using the CKD EPI equation. This calculation has not been validated in all clinical situations. eGFR's persistently <60  mL/min signify possible Chronic Kidney Disease.    Anion gap 8 5 - 15  CBC     Status: Abnormal   Collection Time: 04/25/16  4:59 AM  Result Value Ref Range   WBC 6.3 4.0 - 10.5 K/uL   RBC 3.42 (L) 4.22 - 5.81 MIL/uL   Hemoglobin 10.6 (L) 13.0 - 17.0 g/dL   HCT 33.2 (L) 39.0 - 52.0 %   MCV 97.1 78.0 - 100.0 fL   MCH 31.0 26.0 - 34.0 pg   MCHC 31.9 30.0 - 36.0 g/dL   RDW 14.7 11.5 - 15.5 %   Platelets 168 150 - 400 K/uL  I-stat troponin, ED     Status: Abnormal   Collection Time: 04/25/16  5:10 AM  Result Value Ref Range   Troponin i, poc 0.11 (HH) 0.00 - 0.08 ng/mL   Comment NOTIFIED PHYSICIAN    Comment 3            Comment: Due to the release kinetics of cTnI, a negative result within the first hours of the onset of symptoms does not rule out myocardial infarction with certainty. If myocardial infarction is still suspected, repeat the test at appropriate intervals.   Troponin I     Status: Abnormal   Collection Time: 04/25/16  5:36 AM  Result Value Ref Range   Troponin I 0.09 (HH) <0.03 ng/mL    Comment: CRITICAL RESULT CALLED TO, READ BACK BY AND VERIFIED WITH: INGRAM,T RN 04/25/2016 0624 JORDANS   Troponin I     Status: Abnormal   Collection Time: 04/25/16  9:05 AM  Result Value Ref Range   Troponin I 0.27 (HH) <0.03 ng/mL    Comment: CRITICAL VALUE NOTED.  VALUE IS CONSISTENT WITH PREVIOUSLY REPORTED AND CALLED VALUE.  Heparin level (unfractionated)     Status: None   Collection Time: 04/25/16  2:56 PM  Result Value Ref Range   Heparin Unfractionated 0.60 0.30 - 0.70 IU/mL    Comment:        IF HEPARIN RESULTS ARE BELOW EXPECTED VALUES, AND PATIENT DOSAGE HAS BEEN CONFIRMED, SUGGEST FOLLOW UP TESTING OF ANTITHROMBIN III LEVELS.   Troponin I     Status: Abnormal   Collection Time: 04/25/16  2:56 PM  Result Value Ref Range   Troponin I 3.03 (HH) <0.03 ng/mL    Comment: REPEATED TO VERIFY CRITICAL RESULT CALLED TO, READ BACK BY AND VERIFIED WITH: ALLENKRN  [id number] M[place]AU[place] CORRECT TIME CALLED 1627   Troponin I     Status: Abnormal   Collection Time: 04/25/16  8:04 PM  Result Value Ref Range   Troponin I 4.99 (HH) <0.03 ng/mL    Comment: CRITICAL VALUE NOTED.  VALUE IS CONSISTENT WITH PREVIOUSLY REPORTED  AND CALLED VALUE.  Heparin level (unfractionated)     Status: None   Collection Time: 04/25/16 10:24 PM  Result Value Ref Range   Heparin Unfractionated 0.50 0.30 - 0.70 IU/mL    Comment:        IF HEPARIN RESULTS ARE BELOW EXPECTED VALUES, AND PATIENT DOSAGE HAS BEEN CONFIRMED, SUGGEST FOLLOW UP TESTING OF ANTITHROMBIN III LEVELS.   Troponin I     Status: Abnormal   Collection Time: 04/26/16  2:13 AM  Result Value Ref Range   Troponin I 3.88 (HH) <0.03 ng/mL    Comment: CRITICAL VALUE NOTED.  VALUE IS CONSISTENT WITH PREVIOUSLY REPORTED AND CALLED VALUE.  Heparin level (unfractionated)     Status: None   Collection Time: 04/26/16  2:13 AM  Result Value Ref Range   Heparin Unfractionated 0.50 0.30 - 0.70 IU/mL    Comment:        IF HEPARIN RESULTS ARE BELOW EXPECTED VALUES, AND PATIENT DOSAGE HAS BEEN CONFIRMED, SUGGEST FOLLOW UP TESTING OF ANTITHROMBIN III LEVELS.   CBC     Status: Abnormal   Collection Time: 04/26/16  6:32 AM  Result Value Ref Range   WBC 8.4 4.0 - 10.5 K/uL   RBC 3.59 (L) 4.22 - 5.81 MIL/uL   Hemoglobin 10.9 (L) 13.0 - 17.0 g/dL   HCT 34.3 (L) 39.0 - 52.0 %   MCV 95.5 78.0 - 100.0 fL   MCH 30.4 26.0 - 34.0 pg   MCHC 31.8 30.0 - 36.0 g/dL   RDW 14.4 11.5 - 15.5 %   Platelets 169 150 - 400 K/uL  Basic metabolic panel     Status: None   Collection Time: 04/26/16  6:32 AM  Result Value Ref Range   Sodium 136 135 - 145 mmol/L   Potassium 3.6 3.5 - 5.1 mmol/L   Chloride 101 101 - 111 mmol/L   CO2 27 22 - 32 mmol/L   Glucose, Bld 83 65 - 99 mg/dL   BUN 14 6 - 20 mg/dL   Creatinine, Ser 0.91 0.61 - 1.24 mg/dL   Calcium 9.1 8.9 - 10.3 mg/dL   GFR calc non Af Amer >60 >60 mL/min   GFR calc Af Amer  >60 >60 mL/min    Comment: (NOTE) The eGFR has been calculated using the CKD EPI equation. This calculation has not been validated in all clinical situations. eGFR's persistently <60 mL/min signify possible Chronic Kidney Disease.    Anion gap 8 5 - 15  Protime-INR     Status: Abnormal   Collection Time: 04/26/16  6:32 AM  Result Value Ref Range   Prothrombin Time 16.0 (H) 11.4 - 15.2 seconds   INR 1.27     Imaging: Dg Chest 2 View  Result Date: 04/25/2016 CLINICAL DATA:  Initial evaluation for acute chest pain, history of COPD. EXAM: CHEST  2 VIEW COMPARISON:  Prior radiograph from 02/04/2015. FINDINGS: Cardiomegaly, stable from prior. Mediastinal silhouette within normal limits. Aortic atherosclerosis noted. Lungs normally inflated. Chronic coarsening of the interstitial markings with changes related COPD. Superimposed streaky perihilar linear atelectasis/scarring. There is a residual nodular density at the left perihilar region measuring 19 x 20 mm, decreased relative to most recent radiograph. No other focal infiltrates. No pulmonary edema or pleural effusion. No pneumothorax. No acute osseous abnormality. IMPRESSION: 1. COPD with bilateral perihilar scarring. 2. Interval decrease in size of superior segment left lower lobe opacity as above. 3. No other active cardiopulmonary disease. 4. Aortic atherosclerosis. Electronically Signed  By: Jeannine Boga M.D.   On: 04/25/2016 06:33    Assessment:  Principal Problem:   NSTEMI (non-ST elevated myocardial infarction) [facility]) Active Problems:   Dyslipidemia   Essential hypertension   Atrial fibrillation (HCC)   Chronic anticoagulation   Plan:  1. No further chest pain. Ambulating today without difficulty. Remains on heparin. Plan for LHC today. On high dose lipitor 80 mg daily - check FLP in am tomorrow if still here.  Time Spent Directly with Patient:  15 minutes  Length of Stay:  LOS: 0 days   Pixie Casino, MD,  [facility] Attending Cardiologist [place] 04/26/2016, 12:33 PM

## 2016-04-26 NOTE — Interval H&P Note (Signed)
Cath Lab Visit (complete for each Cath Lab visit)  Clinical Evaluation Leading to the Procedure:   ACS: Yes.    Non-ACS:    Anginal Classification: CCS III  Anti-ischemic medical therapy: Minimal Therapy (1 class of medications)  Non-Invasive Test Results: No non-invasive testing performed  Prior CABG: No previous CABG      History and Physical Interval Note:  04/26/2016 3:43 PM  [name]  has presented today for surgery, with the diagnosis of unstable angina  The various methods of treatment have been discussed with the patient and family. After consideration of risks, benefits and other options for treatment, the patient has consented to  Procedure(s): Left Heart Cath and Coronary Angiography (N/A) as a surgical intervention .  The patient's history has been reviewed, patient examined, no change in status, stable for surgery.  I have reviewed the patient's chart and labs.  Questions were answered to the patient's satisfaction.     [name]

## 2016-04-26 NOTE — Progress Notes (Signed)
DAILY PROGRESS NOTE  Subjective:  No events overnight. Troponin peak ad 4.99. Plan for LHC today.   Objective:  Temp:  [98 F (36.7 C)-98.4 F (36.9 C)] 98 F (36.7 C) (01/29 0442) Pulse Rate:  [64-78] 64 (01/29 0442) Resp:  [18-19] 18 (01/29 0442) BP: (127-134)/(62-88) 127/74 (01/29 0442) SpO2:  [94 %-96 %] 94 % (01/29 0442) Weight change:   Intake/Output from previous day: 01/28 0701 - 01/29 0700 In: 360 [P.O.:360] Out: 1100 [Urine:1100]  Intake/Output from this shift: No intake/output data recorded.  Medications: No current facility-administered medications on file prior to encounter.    Current Outpatient Prescriptions on File Prior to Encounter  Medication Sig Dispense Refill  . albuterol (PROVENTIL HFA;VENTOLIN HFA) 108 (90 Base) MCG/ACT inhaler Inhale 1-2 puffs into the lungs every 6 (six) hours as needed for wheezing or shortness of breath.    Marland Kitchen alendronate (FOSAMAX) 70 MG tablet Take 70 mg by mouth once a week. Take with a full glass of water on an empty stomach.    Marland Kitchen CALCIUM PO Take 1,000 mg by mouth daily.    . cetirizine (ZYRTEC) 10 MG tablet Take 10 mg by mouth every evening.     . Cholecalciferol (VITAMIN D PO) Take 3 capsules by mouth daily.    Marland Kitchen dicyclomine (BENTYL) 10 MG capsule Take 1 capsule (10 mg total) by mouth 4 (four) times daily -  before meals and at bedtime. 120 capsule 5  . finasteride (PROSCAR) 5 MG tablet Take 10 mg by mouth daily.     . fluticasone (FLONASE) 50 MCG/ACT nasal spray Place 2 sprays into both nostrils every morning.    . Fluticasone-Salmeterol (ADVAIR) 250-50 MCG/DOSE AEPB Inhale 1 puff into the lungs 2 (two) times daily.    Marland Kitchen HYDROcodone-acetaminophen (NORCO/VICODIN) 5-325 MG tablet Take 1-2 tablets by mouth every 4 (four) hours as needed for moderate pain. 30 tablet 0  . montelukast (SINGULAIR) 10 MG tablet Take 10 mg by mouth at bedtime.    . predniSONE (DELTASONE) 10 MG tablet Take 10 mg by mouth daily.    . Probiotic  Product (PROBIOTIC PO) Take 1 capsule by mouth daily.    . temazepam (RESTORIL) 15 MG capsule Take 15-30 mg by mouth at bedtime as needed for sleep.     . Tiotropium Bromide Monohydrate (SPIRIVA RESPIMAT) 2.5 MCG/ACT AERS Inhale 2 puffs into the lungs daily.    . verapamil (COVERA HS) 180 MG (CO) 24 hr tablet Take 1 tablet (180 mg total) by mouth at bedtime. (Patient taking differently: Take 180 mg by mouth every evening. ) 30 tablet 5  . doxycycline (VIBRA-TABS) 100 MG tablet Take 1 tablet (100 mg total) by mouth 2 (two) times daily. (Patient not taking: Reported on 04/25/2016) 20 tablet 0  . [DISCONTINUED] beclomethasone (QVAR) 80 MCG/ACT inhaler Inhale 2 puffs into the lungs 2 (two) times daily.      Physical Exam: General appearance: alert and no distress Neck: no carotid bruit and no JVD Lungs: clear to auscultation bilaterally Heart: regular rate and rhythm, S1, S2 normal, no murmur, click, rub or gallop Abdomen: soft, non-tender; bowel sounds normal; no masses,  no organomegaly Extremities: extremities normal, atraumatic, no cyanosis or edema Pulses: 2+ and symmetric Skin: Skin color, texture, turgor normal. No rashes or lesions Neurologic: Grossly normal Psych: Pleasant  Lab Results: Results for orders placed or performed during the hospital encounter of 04/25/16 (from the past 48 hour(s))  Basic metabolic panel  Status: Abnormal   Collection Time: 04/25/16  4:59 AM  Result Value Ref Range   Sodium 138 135 - 145 mmol/L   Potassium 3.7 3.5 - 5.1 mmol/L   Chloride 101 101 - 111 mmol/L   CO2 29 22 - 32 mmol/L   Glucose, Bld 93 65 - 99 mg/dL   BUN 20 6 - 20 mg/dL   Creatinine, Ser 1.19 0.61 - 1.24 mg/dL   Calcium 9.4 8.9 - 10.3 mg/dL   GFR calc non Af Amer 52 (L) >60 mL/min   GFR calc Af Amer >60 >60 mL/min    Comment: (NOTE) The eGFR has been calculated using the CKD EPI equation. This calculation has not been validated in all clinical situations. eGFR's persistently <60  mL/min signify possible Chronic Kidney Disease.    Anion gap 8 5 - 15  CBC     Status: Abnormal   Collection Time: 04/25/16  4:59 AM  Result Value Ref Range   WBC 6.3 4.0 - 10.5 K/uL   RBC 3.42 (L) 4.22 - 5.81 MIL/uL   Hemoglobin 10.6 (L) 13.0 - 17.0 g/dL   HCT 33.2 (L) 39.0 - 52.0 %   MCV 97.1 78.0 - 100.0 fL   MCH 31.0 26.0 - 34.0 pg   MCHC 31.9 30.0 - 36.0 g/dL   RDW 14.7 11.5 - 15.5 %   Platelets 168 150 - 400 K/uL  I-stat troponin, ED     Status: Abnormal   Collection Time: 04/25/16  5:10 AM  Result Value Ref Range   Troponin i, poc 0.11 (HH) 0.00 - 0.08 ng/mL   Comment NOTIFIED PHYSICIAN    Comment 3            Comment: Due to the release kinetics of cTnI, a negative result within the first hours of the onset of symptoms does not rule out myocardial infarction with certainty. If myocardial infarction is still suspected, repeat the test at appropriate intervals.   Troponin I     Status: Abnormal   Collection Time: 04/25/16  5:36 AM  Result Value Ref Range   Troponin I 0.09 (HH) <0.03 ng/mL    Comment: CRITICAL RESULT CALLED TO, READ BACK BY AND VERIFIED WITH: INGRAM,T RN 04/25/2016 0624 JORDANS   Troponin I     Status: Abnormal   Collection Time: 04/25/16  9:05 AM  Result Value Ref Range   Troponin I 0.27 (HH) <0.03 ng/mL    Comment: CRITICAL VALUE NOTED.  VALUE IS CONSISTENT WITH PREVIOUSLY REPORTED AND CALLED VALUE.  Heparin level (unfractionated)     Status: None   Collection Time: 04/25/16  2:56 PM  Result Value Ref Range   Heparin Unfractionated 0.60 0.30 - 0.70 IU/mL    Comment:        IF HEPARIN RESULTS ARE BELOW EXPECTED VALUES, AND PATIENT DOSAGE HAS BEEN CONFIRMED, SUGGEST FOLLOW UP TESTING OF ANTITHROMBIN III LEVELS.   Troponin I     Status: Abnormal   Collection Time: 04/25/16  2:56 PM  Result Value Ref Range   Troponin I 3.03 (HH) <0.03 ng/mL    Comment: REPEATED TO VERIFY CRITICAL RESULT CALLED TO, READ BACK BY AND VERIFIED WITH: ALLENKRN  [id number] [place] CORRECT TIME CALLED 1627   Troponin I     Status: Abnormal   Collection Time: 04/25/16  8:04 PM  Result Value Ref Range   Troponin I 4.99 (HH) <0.03 ng/mL    Comment: CRITICAL VALUE NOTED.  VALUE IS CONSISTENT WITH PREVIOUSLY REPORTED  AND CALLED VALUE.  Heparin level (unfractionated)     Status: None   Collection Time: 04/25/16 10:24 PM  Result Value Ref Range   Heparin Unfractionated 0.50 0.30 - 0.70 IU/mL    Comment:        IF HEPARIN RESULTS ARE BELOW EXPECTED VALUES, AND PATIENT DOSAGE HAS BEEN CONFIRMED, SUGGEST FOLLOW UP TESTING OF ANTITHROMBIN III LEVELS.   Troponin I     Status: Abnormal   Collection Time: 04/26/16  2:13 AM  Result Value Ref Range   Troponin I 3.88 (HH) <0.03 ng/mL    Comment: CRITICAL VALUE NOTED.  VALUE IS CONSISTENT WITH PREVIOUSLY REPORTED AND CALLED VALUE.  Heparin level (unfractionated)     Status: None   Collection Time: 04/26/16  2:13 AM  Result Value Ref Range   Heparin Unfractionated 0.50 0.30 - 0.70 IU/mL    Comment:        IF HEPARIN RESULTS ARE BELOW EXPECTED VALUES, AND PATIENT DOSAGE HAS BEEN CONFIRMED, SUGGEST FOLLOW UP TESTING OF ANTITHROMBIN III LEVELS.   CBC     Status: Abnormal   Collection Time: 04/26/16  6:32 AM  Result Value Ref Range   WBC 8.4 4.0 - 10.5 K/uL   RBC 3.59 (L) 4.22 - 5.81 MIL/uL   Hemoglobin 10.9 (L) 13.0 - 17.0 g/dL   HCT 34.3 (L) 39.0 - 52.0 %   MCV 95.5 78.0 - 100.0 fL   MCH 30.4 26.0 - 34.0 pg   MCHC 31.8 30.0 - 36.0 g/dL   RDW 14.4 11.5 - 15.5 %   Platelets 169 150 - 400 K/uL  Basic metabolic panel     Status: None   Collection Time: 04/26/16  6:32 AM  Result Value Ref Range   Sodium 136 135 - 145 mmol/L   Potassium 3.6 3.5 - 5.1 mmol/L   Chloride 101 101 - 111 mmol/L   CO2 27 22 - 32 mmol/L   Glucose, Bld 83 65 - 99 mg/dL   BUN 14 6 - 20 mg/dL   Creatinine, Ser 0.91 0.61 - 1.24 mg/dL   Calcium 9.1 8.9 - 10.3 mg/dL   GFR calc non Af Amer >60 >60 mL/min   GFR calc Af Amer  >60 >60 mL/min    Comment: (NOTE) The eGFR has been calculated using the CKD EPI equation. This calculation has not been validated in all clinical situations. eGFR's persistently <60 mL/min signify possible Chronic Kidney Disease.    Anion gap 8 5 - 15  Protime-INR     Status: Abnormal   Collection Time: 04/26/16  6:32 AM  Result Value Ref Range   Prothrombin Time 16.0 (H) 11.4 - 15.2 seconds   INR 1.27     Imaging: Dg Chest 2 View  Result Date: 04/25/2016 CLINICAL DATA:  Initial evaluation for acute chest pain, history of COPD. EXAM: CHEST  2 VIEW COMPARISON:  Prior radiograph from 02/04/2015. FINDINGS: Cardiomegaly, stable from prior. Mediastinal silhouette within normal limits. Aortic atherosclerosis noted. Lungs normally inflated. Chronic coarsening of the interstitial markings with changes related COPD. Superimposed streaky perihilar linear atelectasis/scarring. There is a residual nodular density at the left perihilar region measuring 19 x 20 mm, decreased relative to most recent radiograph. No other focal infiltrates. No pulmonary edema or pleural effusion. No pneumothorax. No acute osseous abnormality. IMPRESSION: 1. COPD with bilateral perihilar scarring. 2. Interval decrease in size of superior segment left lower lobe opacity as above. 3. No other active cardiopulmonary disease. 4. Aortic atherosclerosis. Electronically Signed  By: Jeannine Boga M.D.   On: 04/25/2016 06:33    Assessment:  Principal Problem:   NSTEMI (non-ST elevated myocardial infarction) [facility]) Active Problems:   Dyslipidemia   Essential hypertension   Atrial fibrillation (HCC)   Chronic anticoagulation   Plan:  1. No further chest pain. Ambulating today without difficulty. Remains on heparin. Plan for LHC today. On high dose lipitor 80 mg daily - check FLP in am tomorrow if still here.  Time Spent Directly with Patient:  15 minutes  Length of Stay:  LOS: 0 days   Pixie Casino, MD,  [facility] Attending Cardiologist [place] 04/26/2016, 12:33 PM

## 2016-04-26 NOTE — Progress Notes (Signed)
[place] for Heparin Indication: chest pain/ACS  Allergies  Allergen Reactions  . Sulfa Antibiotics Other (See Comments)    Unsure of reaction  . Sulfonamide Derivatives Other (See Comments)    Unk    Patient Measurements: Height: '5\' 9"'$  (175.3 cm) Weight: 178 lb 5.6 oz (80.9 kg) IBW/kg (Calculated) : 70.7  Heparin Dosing Weight: 81 kg  Vital Signs: Temp: 98 F (36.7 C) (01/29 0442) Temp Source: Oral (01/29 0442) BP: 127/74 (01/29 0442) Pulse Rate: 64 (01/29 0442)  Labs:  Recent Labs  04/25/16 0459  04/25/16 1456 04/25/16 2004 04/25/16 2224 04/26/16 0213 04/26/16 0632  HGB 10.6*  --   --   --   --   --  10.9*  HCT 33.2*  --   --   --   --   --  34.3*  PLT 168  --   --   --   --   --  169  LABPROT  --   --   --   --   --   --  16.0*  INR  --   --   --   --   --   --  1.27  HEPARINUNFRC  --   --  0.60  --  0.50 0.50  --   CREATININE 1.19  --   --   --   --   --  0.91  TROPONINI  --   < > 3.03* 4.99*  --  3.88*  --   < > = values in this interval not displayed.  Estimated Creatinine Clearance: 55 mL/min (by C-G formula based on SCr of 0.91 mg/dL).   Assessment: 83 y.o. M presents with NSTEMI. Pharmacy consulted to dose heparin and plans noted fir cath. He is noted with chronic afib and considering anticoagulation. -Heparin level remains therapeutic 0.5 units/mL.  Goal of Therapy:  Heparin level 0.3-0.7 units/ml Monitor platelets by anticoagulation protocol: Yes   Plan:  Continue Heparin gtt at 1100 units/hr Daily heparin level and CBC Will follow plans post cath  Hildred Laser, Pharm D 04/26/2016 8:10 AM

## 2016-04-26 NOTE — Progress Notes (Signed)
Site area: right groin sheath pulled and pressure held by [employer] Prior to Removal:  Level 0 Pressure Applied For: 20 minutes Manual:   yes Patient Status During Pull:  stable Post Pull Site:  Level 0 Post Pull Instructions Given:  yes Post Pull Pulses Present: yes Dressing Applied:  yes Bedrest begins @ 1715 Comments:

## 2016-04-27 ENCOUNTER — Inpatient Hospital Stay (HOSPITAL_COMMUNITY): Payer: Medicare Other

## 2016-04-27 ENCOUNTER — Encounter (HOSPITAL_COMMUNITY): Payer: Self-pay | Admitting: Cardiovascular Disease

## 2016-04-27 DIAGNOSIS — I379 Nonrheumatic pulmonary valve disorder, unspecified: Secondary | ICD-10-CM

## 2016-04-27 DIAGNOSIS — I5181 Takotsubo syndrome: Secondary | ICD-10-CM | POA: Diagnosis present

## 2016-04-27 LAB — LIPID PANEL
Cholesterol: 143 mg/dL (ref 0–200)
HDL: 54 mg/dL (ref >40)
LDL CALC: 79 mg/dL (ref 0–99)
Total CHOL/HDL Ratio: 2.6 RATIO
Triglycerides: 52 mg/dL (ref <150)
VLDL: 10 mg/dL (ref 0–40)

## 2016-04-27 LAB — BASIC METABOLIC PANEL
Anion gap: 8 (ref 5–15)
BUN: 14 mg/dL (ref 6–20)
CALCIUM: 9.1 mg/dL (ref 8.9–10.3)
CHLORIDE: 103 mmol/L (ref 101–111)
CO2: 28 mmol/L (ref 22–32)
CREATININE: 0.91 mg/dL (ref 0.61–1.24)
GFR calc Af Amer: >60 mL/min (ref >60)
GFR calc non Af Amer: >60 mL/min (ref >60)
Glucose, Bld: 101 mg/dL — ABNORMAL HIGH (ref 65–99)
Potassium: 3.8 mmol/L (ref 3.5–5.1)
SODIUM: 139 mmol/L (ref 135–145)

## 2016-04-27 LAB — ECHOCARDIOGRAM COMPLETE
HEIGHTINCHES: 69 in
WEIGHTICAEL: 2853.63 [oz_av]

## 2016-04-27 MED ORDER — ATORVASTATIN CALCIUM 80 MG PO TABS: 80.0000 mg | ORAL_TABLET | Freq: Every day | ORAL | 6 refills | Status: DC

## 2016-04-27 MED ORDER — APIXABAN 5 MG PO TABS: 5.0000 mg | ORAL_TABLET | Freq: Two times a day (BID) | ORAL | 6 refills | Status: DC

## 2016-04-27 MED ORDER — METOPROLOL TARTRATE 25 MG PO TABS: 25.0000 mg | ORAL_TABLET | Freq: Two times a day (BID) | ORAL | 6 refills | Status: DC

## 2016-04-27 MED ORDER — ASPIRIN 81 MG PO TBEC: 81.0000 mg | DELAYED_RELEASE_TABLET | Freq: Every day | ORAL | Status: DC

## 2016-04-27 NOTE — Progress Notes (Signed)
Pt had a new onset of confusion. Neuro check performed WDL. Pt show no signs of distress. VSS, CBG 101. Spoke with Pt daughter who stated he sometime has confusion at night. Will continue to monitor Pt. Isac Caddy, RN

## 2016-04-27 NOTE — Progress Notes (Signed)
[name] to be D/C'd Home per MD order. Discussed with the patient and all questions fully answered.  Allergies as of 04/27/2016      Reactions   Sulfa Antibiotics Other (See Comments)   Unsure of reaction   Sulfonamide Derivatives Other (See Comments)   Unk      Medication List    STOP taking these medications   doxycycline 100 MG tablet Commonly known as:  VIBRA-TABS   verapamil 180 MG (CO) 24 hr tablet Commonly known as:  COVERA HS     TAKE these medications   albuterol 108 (90 Base) MCG/ACT inhaler Commonly known as:  PROVENTIL HFA;VENTOLIN HFA Inhale 1-2 puffs into the lungs every 6 (six) hours as needed for wheezing or shortness of breath.   alendronate 70 MG tablet Commonly known as:  FOSAMAX Take 70 mg by mouth once a week. Take with a full glass of water on an empty stomach.   apixaban 5 MG Tabs tablet Commonly known as:  ELIQUIS Take 1 tablet (5 mg total) by mouth 2 (two) times daily.   aspirin 81 MG EC tablet Take 1 tablet (81 mg total) by mouth daily. Start taking on:  04/28/2016   atorvastatin 80 MG tablet Commonly known as:  LIPITOR Take 1 tablet (80 mg total) by mouth daily at 6 PM.   CALCIUM PO Take 1,000 mg by mouth daily.   cetirizine 10 MG tablet Commonly known as:  ZYRTEC Take 10 mg by mouth every evening.   dicyclomine 10 MG capsule Commonly known as:  BENTYL Take 1 capsule (10 mg total) by mouth 4 (four) times daily -  before meals and at bedtime.   finasteride 5 MG tablet Commonly known as:  PROSCAR Take 10 mg by mouth daily.   fluticasone 50 MCG/ACT nasal spray Commonly known as:  FLONASE Place 2 sprays into both nostrils every morning.   Fluticasone-Salmeterol 250-50 MCG/DOSE Aepb Commonly known as:  ADVAIR Inhale 1 puff into the lungs 2 (two) times daily.   HYDROcodone-acetaminophen 5-325 MG tablet Commonly known as:  NORCO/VICODIN Take 1-2 tablets by mouth every 4 (four) hours as needed for moderate pain.   metoprolol  tartrate 25 MG tablet Commonly known as:  LOPRESSOR Take 1 tablet (25 mg total) by mouth 2 (two) times daily.   montelukast 10 MG tablet Commonly known as:  SINGULAIR Take 10 mg by mouth at bedtime.   predniSONE 10 MG tablet Commonly known as:  DELTASONE Take 10 mg by mouth daily.   PROBIOTIC PO Take 1 capsule by mouth daily.   SPIRIVA RESPIMAT 2.5 MCG/ACT Aers Generic drug:  Tiotropium Bromide Monohydrate Inhale 2 puffs into the lungs daily.   temazepam 15 MG capsule Commonly known as:  RESTORIL Take 15-30 mg by mouth at bedtime as needed for sleep.   VITAMIN D PO Take 3 capsules by mouth daily.       VVS, Skin clean, dry and intact without evidence of skin break down, no evidence of skin tears noted.  IV catheter discontinued intact. Site without signs and symptoms of complications. Dressing and pressure applied.  An After Visit Summary was printed and given to the patient.  Patient escorted via [place], and D/C home via private auto.  [name]  04/27/2016 4:55 PM

## 2016-04-27 NOTE — Progress Notes (Signed)
Progress Note  Patient Name: [name] Date of Encounter: 04/27/2016  Primary Cardiologist: Dr. Lovena Le  Subjective   Feeling well this morning.   Inpatient Medications    Scheduled Meds: . aspirin EC  81 mg Oral Daily  . atorvastatin  80 mg Oral q1800  . dicyclomine  10 mg Oral TID AC & HS  . finasteride  10 mg Oral Daily  . fluticasone  2 spray Each Nare q morning - 10a  . loratadine  10 mg Oral Daily  . metoprolol tartrate  25 mg Oral BID  . montelukast  10 mg Oral QHS  . predniSONE  10 mg Oral Daily  . sodium chloride flush  3 mL Intravenous Q12H   Continuous Infusions:  PRN Meds: sodium chloride, acetaminophen, acetaminophen, albuterol, guaiFENesin-dextromethorphan, hydrALAZINE, HYDROcodone-acetaminophen, morphine injection, nitroGLYCERIN, ondansetron (ZOFRAN) IV, ondansetron (ZOFRAN) IV, sodium chloride flush   Vital Signs    Vitals:   04/26/16 2113 04/26/16 2333 04/27/16 0511 04/27/16 0836  BP: (!) 108/47 139/78 130/76 121/67  Pulse: 70 80 70 80  Resp:  '18 18 17  '$ Temp:  98.3 F (36.8 C) 98 F (36.7 C) 98.4 F (36.9 C)  TempSrc:  Oral Oral Oral  SpO2: 96% 94% 98% 97%  Weight:      Height:        Intake/Output Summary (Last 24 hours) at 04/27/16 1026 Last data filed at 04/27/16 0327  Gross per 24 hour  Intake                0 ml  Output              250 ml  Net             -250 ml   Filed Weights   04/25/16 1220  Weight: 178 lb 5.6 oz (80.9 kg)    Telemetry    AF - Personally Reviewed  ECG    AF - Personally Reviewed  Physical Exam   GEN: No acute distress.   Neck: No JVD Cardiac: RRR, no murmurs, rubs, or gallops.  Respiratory: Clear to auscultation bilaterally. GI: Soft, nontender, non-distended  MS: No edema; No deformity. Right groin without hematoma, mild bruising. Neuro:  Nonfocal  Psych: Normal affect   Labs    Chemistry Recent Labs Lab 04/25/16 0459 04/26/16 0632 04/27/16 0316  NA 138 136 139  K 3.7 3.6 3.8    CL 101 101 103  CO2 '29 27 28  '$ GLUCOSE 93 83 101*  BUN '20 14 14  '$ CREATININE 1.19 0.91 0.91  CALCIUM 9.4 9.1 9.1  GFRNONAA 52* >60 >60  GFRAA >60 >60 >60  ANIONGAP '8 8 8     '$ Hematology Recent Labs Lab 04/25/16 0459 04/26/16 0632  WBC 6.3 8.4  RBC 3.42* 3.59*  HGB 10.6* 10.9*  HCT 33.2* 34.3*  MCV 97.1 95.5  MCH 31.0 30.4  MCHC 31.9 31.8  RDW 14.7 14.4  PLT 168 169    Cardiac Enzymes Recent Labs Lab 04/25/16 0905 04/25/16 1456 04/25/16 2004 04/26/16 0213  TROPONINI 0.27* 3.03* 4.99* 3.88*    Recent Labs Lab 04/25/16 0510  TROPIPOC 0.11*     BNPNo results for input(s): BNP, PROBNP in the last 168 hours.   DDimer No results for input(s): DDIMER in the last 168 hours.   Radiology    No results found.  Cardiac Studies   LHC: 04/26/16  IMPRESSION: Dr [name] has normal coronary arteries. I do not do a left  ventriculogram. I suspect his elevated troponin with stress-induced. He may have Takatsubo syndrome. We will check a 2-D echocardiogram. Medical therapy will be recommended. The sheath was removed and pressure held on the groin to achieve hemostasis. The patient left the lab in stable condition. His IV heparin was discontinued.  Patient Profile     83 y.o. male with PMH of HTN, HLD, and persistent AF who presented with chest pain and elevated troponin. Underwent LHC with Dr. Gwenlyn Found.  Assessment & Plan    1. Elevated Trop: Underwent LHC yesterday with Dr. Gwenlyn Found that showed normal coronaries. Felt that his elevated troponin was likely stress induced. Left ventriculogram not done during cath. Trop peaked at 4.99.  -- on BB, Statin, ASA -- echo pending   2. Persistent AF: Rate controlled on telemetry. Was on verapamil prior to admission, but changed to metoprolol given concern of CAD.  -- Was on eliquis previously but stopped by his PCP, concern for falls? This patients CHA2DS2-VASc Score and unadjusted Ischemic Stroke Rate (% per year) is equal to 2.2 % stroke  rate/year from a score of 2 HTN, Age > 75, but echo is pending, may be a 3. Willing to resume [place].   3. HTN: Controlled  4. HLD: on statin    Signed, Reino Bellis, NP  04/27/2016, 10:26 AM

## 2016-04-27 NOTE — Discharge Summary (Signed)
Discharge Summary    Patient ID: [name],  MRN: [id number], DOB/AGE: 1925/02/15 83 y.o.  Admit date: 04/25/2016 Discharge date: 04/27/2016  Primary Care Provider: [name] Primary Cardiologist: Dr. Lovena Le  Discharge Diagnoses    Principal Problem:   Stress-induced cardiomyopathy Active Problems:   Dyslipidemia   Essential hypertension   Atrial fibrillation (HCC)   Chronic anticoagulation   NSTEMI (non-ST elevated myocardial infarction) (HCC)   Allergies Allergies  Allergen Reactions  . Sulfa Antibiotics Other (See Comments)    Unsure of reaction  . Sulfonamide Derivatives Other (See Comments)    Unk    Diagnostic Studies/Procedures    LHC: 04/26/16  IMPRESSION:Dr Krehbiel has normal coronary arteries. I do not do a left ventriculogram. I suspect his elevated troponin with stress-induced. He may have Takatsubo syndrome. We will check a 2-D echocardiogram. Medical therapy will be recommended. The sheath was removed and pressure held on the groin to achieve hemostasis. The patient left the lab in stable condition. His IV heparin was discontinued.  [name]. MD, [facility]  TTE: 04/27/16  Study Conclusions  - Left ventricle: The cavity size was normal. There was mild   concentric hypertrophy. Systolic function was normal. The   estimated ejection fraction was in the range of 55% to 60%.   Possible mild hypokinesis of the mid-apicalinferolateral   myocardium. - Aortic valve: Noncoronary cusp mobility was severely restricted. - Mitral valve: Calcified annulus. - Left atrium: The atrium was moderately dilated. - Right atrium: The atrium was severely dilated. - Atrial septum: No defect or patent foramen ovale was identified. 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 According to the ED note, he had a history of DVT, however unable to locate such record. He was previously seen by Dr. Lovena Le in 2015. He was still on 2.5 mg twice a day of eliquis at the time. He was supposed to be taking 5 mg twice a day, however the dosage was later reduced to 2.5 mg twice a day due to epistaxis. He has not been seen by cardiology service since then. In 2016, he was diagnosed with lung cancer. According to the patient, he had known decreased pulmonary function prior to that, so between lobectomy versus other treatments, he eventually decided to pursue cryoablation at [facility] on 01/02/2015. Postoperative course was complicated by at least 2 admissions for recurrent hemothorax treated by thoracentesis. He also had a discussion with his PCP, Dr. Brigitte Pulse, eventually formed agreement that with his age, comorbidities, and a history of fall, eliquis will offer more risk than benefit. Besides essentially permanent atrial fibrillation, he has not had any cardiac history otherwise. He has known coronary calcification on CT of the chest. Otherwise, he has been doing well at his baseline. Unfortunately his wife passed away 11 days ago, he just attended the funeral service last Thursday. Therefore for the past several weeks, he has been under tremendous amount of stress.  On the morning of admission around 4 AM, he got up to go to the bathroom. On his way back to the bed, he started having substernal chest pain radiating to the left shoulder. He denies any shortness of breath, diaphoresis or dizziness. He denies any recent fever, chill, or cough. He does have bilateral ankle edema which has  been persistent for the past several month. After 10-15 minutes, he decided to call EMS, by the time EMS arrived at [facility], his chest pain has completely went away and has not recurred since. The total duration of chest pain this morning was roughly 25 minutes. Prior to this morning, he denies any exertional  chest discomfort, however also admits that he does not usually exert himself. Initial laboratory finding include troponin of 0.09. Hemoglobin of 10.6. EKG showed atrial fibrillation without significant ST changes. Chest x-ray showed no acute cardiopulmonary disease.  Hospital Course     Consultants: None  He was admitted with plans to undergo cardiac catheterization. Cath with Dr. Gwenlyn Found showed normal coronaries. His verapamil was changed to metoprolol on admission given the concern for CAD. Follow up TTE showed normal EF with possible mild hypokinesis of the mid-apical inferolateral myocardium. Trop peaked at 4.99 with downward trend.   He was placed back on Eliquis given his persistent AF prior to discharge. ChadsVasc score of 2. His discharge medications include ASA, BB, statin and Eliquis.   He was seen and assessed by Dr. Debara Pickett on 04/27/16 and determined stable for discharge home. I have arranged for follow up in the office with an APP.  _____________  Discharge Vitals Blood pressure 121/67, pulse 80, temperature 98.4 F (36.9 C), temperature source Oral, resp. rate 17, height '5\' 9"'$  (1.753 m), weight 178 lb 5.6 oz (80.9 kg), SpO2 97 %.  Filed Weights   04/25/16 1220  Weight: 178 lb 5.6 oz (80.9 kg)    Labs & Radiologic Studies    CBC  Recent Labs  04/25/16 0459 04/26/16 0632  WBC 6.3 8.4  HGB 10.6* 10.9*  HCT 33.2* 34.3*  MCV 97.1 95.5  PLT 168 092   Basic Metabolic Panel  Recent Labs  04/26/16 0632 04/27/16 0316  NA 136 139  K 3.6 3.8  CL 101 103  CO2 27 28  GLUCOSE 83 101*  BUN 14 14  CREATININE 0.91 0.91  CALCIUM 9.1 9.1   Liver Function Tests No results for input(s): AST, ALT, ALKPHOS, BILITOT, PROT, ALBUMIN in the last 72 hours. No results for input(s): LIPASE, AMYLASE in the last 72 hours. Cardiac Enzymes  Recent Labs  04/25/16 1456 04/25/16 2004 04/26/16 0213  TROPONINI 3.03* 4.99* 3.88*   BNP Invalid input(s): POCBNP D-Dimer No results for  input(s): DDIMER in the last 72 hours. Hemoglobin A1C No results for input(s): HGBA1C in the last 72 hours. Fasting Lipid Panel  Recent Labs  04/27/16 0316  CHOL 143  HDL 54  LDLCALC 79  TRIG 52  CHOLHDL 2.6   Thyroid Function Tests No results for input(s): TSH, T4TOTAL, T3FREE, THYROIDAB in the last 72 hours.  Invalid input(s): FREET3 _____________  Dg Chest 2 View  Result Date: 04/25/2016 CLINICAL DATA:  Initial evaluation for acute chest pain, history of COPD. EXAM: CHEST  2 VIEW COMPARISON:  Prior radiograph from 02/04/2015. FINDINGS: Cardiomegaly, stable from prior. Mediastinal silhouette within normal limits. Aortic atherosclerosis noted. Lungs normally inflated. Chronic coarsening of the interstitial markings with changes related COPD. Superimposed streaky perihilar linear atelectasis/scarring. There is a residual nodular density at the left perihilar region measuring 19 x 20 mm, decreased relative to most recent radiograph. No other focal infiltrates. No pulmonary edema or pleural effusion. No pneumothorax. No acute osseous abnormality. IMPRESSION: 1. COPD with bilateral perihilar scarring. 2. Interval decrease in size of superior segment left lower lobe opacity as above. 3. No other active cardiopulmonary  disease. 4. Aortic atherosclerosis. Electronically Signed   By: Jeannine Boga M.D.   On: 04/25/2016 06:33   Disposition   Pt is being discharged home today in good condition.  Follow-up Plans & Appointments    Follow-up Information    Richardson Dopp, PA-C Follow up on 05/07/2016.   Specialties:  Cardiology, Physician Assistant Why:  at 9:15am for your follow up appt.  Contact information: [address] [place] [address] [phone]          Discharge Instructions    Call MD for:  redness, tenderness, or signs of infection (pain, swelling, redness, odor or green/yellow discharge around incision site)    Complete by:  As directed    Diet  - low sodium heart healthy    Complete by:  As directed    Discharge instructions    Complete by:  As directed    Groin Site Care Refer to this sheet in the next few weeks. These instructions provide you with information on caring for yourself after your procedure. Your caregiver may also give you more specific instructions. Your treatment has been planned according to current medical practices, but problems sometimes occur. Call your caregiver if you have any problems or questions after your procedure. HOME CARE INSTRUCTIONS You may shower 24 hours after the procedure. Remove the bandage (dressing) and gently wash the site with plain soap and water. Gently pat the site dry.  Do not apply powder or lotion to the site.  Do not sit in a bathtub, swimming pool, or whirlpool for 5 to 7 days.  No bending, squatting, or lifting anything over 10 pounds (4.5 kg) as directed by your caregiver.  Inspect the site at least twice daily.  Do not drive home if you are discharged the same day of the procedure. Have someone else drive you.  You may drive 24 hours after the procedure unless otherwise instructed by your caregiver.  What to expect: Any bruising will usually fade within 1 to 2 weeks.  Blood that collects in the tissue (hematoma) may be painful to the touch. It should usually decrease in size and tenderness within 1 to 2 weeks.  SEEK IMMEDIATE MEDICAL CARE IF: You have unusual pain at the groin site or down the affected leg.  You have redness, warmth, swelling, or pain at the groin site.  You have drainage (other than a small amount of blood on the dressing).  You have chills.  You have a fever or persistent symptoms for more than 72 hours.  You have a fever and your symptoms suddenly get worse.  Your leg becomes pale, cool, tingly, or numb.  You have heavy bleeding from the site. Hold pressure on the site. .   Increase activity slowly    Complete by:  As directed       Discharge  Medications   Current Discharge Medication List    START taking these medications   Details  apixaban (ELIQUIS) 5 MG TABS tablet Take 1 tablet (5 mg total) by mouth 2 (two) times daily. Qty: 60 tablet, Refills: 6    aspirin EC 81 MG EC tablet Take 1 tablet (81 mg total) by mouth daily.    atorvastatin (LIPITOR) 80 MG tablet Take 1 tablet (80 mg total) by mouth daily at 6 PM. Qty: 30 tablet, Refills: 6    metoprolol tartrate (LOPRESSOR) 25 MG tablet Take 1 tablet (25 mg total) by mouth 2 (two) times daily. Qty: 60 tablet,  Refills: 6      CONTINUE these medications which have NOT CHANGED   Details  albuterol (PROVENTIL HFA;VENTOLIN HFA) 108 (90 Base) MCG/ACT inhaler Inhale 1-2 puffs into the lungs every 6 (six) hours as needed for wheezing or shortness of breath.    alendronate (FOSAMAX) 70 MG tablet Take 70 mg by mouth once a week. Take with a full glass of water on an empty stomach.    CALCIUM PO Take 1,000 mg by mouth daily.    cetirizine (ZYRTEC) 10 MG tablet Take 10 mg by mouth every evening.     Cholecalciferol (VITAMIN D PO) Take 3 capsules by mouth daily.    dicyclomine (BENTYL) 10 MG capsule Take 1 capsule (10 mg total) by mouth 4 (four) times daily -  before meals and at bedtime. Qty: 120 capsule, Refills: 5    finasteride (PROSCAR) 5 MG tablet Take 10 mg by mouth daily.     fluticasone (FLONASE) 50 MCG/ACT nasal spray Place 2 sprays into both nostrils every morning.    Fluticasone-Salmeterol (ADVAIR) 250-50 MCG/DOSE AEPB Inhale 1 puff into the lungs 2 (two) times daily.    HYDROcodone-acetaminophen (NORCO/VICODIN) 5-325 MG tablet Take 1-2 tablets by mouth every 4 (four) hours as needed for moderate pain. Qty: 30 tablet, Refills: 0    montelukast (SINGULAIR) 10 MG tablet Take 10 mg by mouth at bedtime.    predniSONE (DELTASONE) 10 MG tablet Take 10 mg by mouth daily.    Probiotic Product (PROBIOTIC PO) Take 1 capsule by mouth daily.    temazepam (RESTORIL) 15  MG capsule Take 15-30 mg by mouth at bedtime as needed for sleep.     Tiotropium Bromide Monohydrate (SPIRIVA RESPIMAT) 2.5 MCG/ACT AERS Inhale 2 puffs into the lungs daily.      STOP taking these medications     verapamil (COVERA HS) 180 MG (CO) 24 hr tablet      doxycycline (VIBRA-TABS) 100 MG tablet          Outstanding Labs/Studies   FLP and LFTs in 6 weeks  Duration of Discharge Encounter   Greater than 30 minutes including physician time.  Signed, Reino Bellis NP-C 04/27/2016, 2:43 PM

## 2016-04-27 NOTE — Care Management Note (Signed)
Case Management Note Marvetta Gibbons RN, BSN Unit 2W-Case Manager [phone]  Patient Details  Name: [name] MRN: [id number] Date of Birth: Aug 20, 1924  Subjective/Objective:  Pt admitted with NSTEMI, s/p cath                  Action/Plan: PTA pt lived at home- has family support- was recently active with [facility] but not currently active with any [facility] services on admission- has needed DME at home that includes w/c. No CM needs noted for discharge.   Expected Discharge Date:  04/27/16               Expected Discharge Plan:  Home/Self Care  In-House Referral:     Discharge planning Services  CM Consult  Post Acute Care Choice:  NA Choice offered to:  NA  DME Arranged:    DME Agency:     HH Arranged:    HH Agency:     Status of Service:  Completed, signed off  If discussed at [employer] of Stay Meetings, dates discussed:    Additional Comments:  Dawayne Patricia, RN 04/27/2016, 3:04 PM

## 2016-04-27 NOTE — Progress Notes (Signed)
  Echocardiogram 2D Echocardiogram has been performed.  [name] 04/27/2016, 1:39 PM

## 2016-04-28 ENCOUNTER — Ambulatory Visit: Payer: Medicare Other | Admitting: Physical Therapy

## 2016-04-29 DIAGNOSIS — M12812 Other specific arthropathies, not elsewhere classified, left shoulder: Secondary | ICD-10-CM | POA: Diagnosis not present

## 2016-04-29 DIAGNOSIS — M25312 Other instability, left shoulder: Secondary | ICD-10-CM | POA: Diagnosis not present

## 2016-05-04 DIAGNOSIS — J301 Allergic rhinitis due to pollen: Secondary | ICD-10-CM | POA: Diagnosis not present

## 2016-05-04 DIAGNOSIS — J3089 Other allergic rhinitis: Secondary | ICD-10-CM | POA: Diagnosis not present

## 2016-05-05 ENCOUNTER — Ambulatory Visit: Payer: Medicare Other | Admitting: Physical Therapy

## 2016-05-07 ENCOUNTER — Ambulatory Visit (INDEPENDENT_AMBULATORY_CARE_PROVIDER_SITE_OTHER): Payer: Medicare Other | Admitting: Physician Assistant

## 2016-05-07 ENCOUNTER — Encounter: Payer: Self-pay | Admitting: Physician Assistant

## 2016-05-07 VITALS — BP 104/60 | HR 64 | Ht 69.5 in | Wt 170.1 lb

## 2016-05-07 DIAGNOSIS — I214 Non-ST elevation (NSTEMI) myocardial infarction: Secondary | ICD-10-CM

## 2016-05-07 DIAGNOSIS — I5181 Takotsubo syndrome: Secondary | ICD-10-CM | POA: Diagnosis not present

## 2016-05-07 DIAGNOSIS — I4821 Permanent atrial fibrillation: Secondary | ICD-10-CM

## 2016-05-07 DIAGNOSIS — I482 Chronic atrial fibrillation: Secondary | ICD-10-CM

## 2016-05-07 DIAGNOSIS — I10 Essential (primary) hypertension: Secondary | ICD-10-CM | POA: Diagnosis not present

## 2016-05-07 DIAGNOSIS — E785 Hyperlipidemia, unspecified: Secondary | ICD-10-CM

## 2016-05-07 MED ORDER — ROSUVASTATIN CALCIUM 10 MG PO TABS: 10.0000 mg | ORAL_TABLET | Freq: Every day | ORAL | 3 refills | Status: DC

## 2016-05-07 MED ORDER — ATORVASTATIN CALCIUM 40 MG PO TABS: 40.0000 mg | ORAL_TABLET | Freq: Every day | ORAL | 3 refills | Status: DC

## 2016-05-07 MED ORDER — ASPIRIN 81 MG PO TBEC: 81.0000 mg | DELAYED_RELEASE_TABLET | Freq: Every day | ORAL | Status: AC

## 2016-05-07 NOTE — Progress Notes (Signed)
Cardiology Office Note:    Date:  05/07/2016   ID:  [name], DOB 03-30-1924, MRN [id number]  PCP:  [name], MD  Cardiologist:  Dr. Cristopher Peru    Referring MD: [name], MD   Chief Complaint  Patient presents with  . 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 He was previously on Apixaban for anticoagulation. He was last seen by Dr. Lovena Le 4/15. His anticoagulation was stopped by primary care after his cancer treatments secondary to increased risk of bleeding.  Admitted 1/28-1/30 with a non-STEMI. His wife had passed away 10 days prior to admission. He presented with substernal chest pain with radiation to his L shoulder.  Cardiac catheterization demonstrated normal coronary arteries. Echocardiogram demonstrated normal ejection fraction with inferolateral hypokinesis. Presentation was felt to be related to stress-induced cardiomyopathy. Given his risk for stroke, apixaban 5 mg twice a day was added back to his medical regimen.  CHADS2-VASc=3 (83 yo, HTN).  He returns for post hospital follow up.  He is here today with his Psychologist, sport and exercise.  He denies any further chest pain.  His shortness of breath is chronic and stable.  He denies any falls.  He denies any bleeding issues.  He has chronic LE edema.  He denies orthopnea, PND, syncope.     Prior CV studies:   The following studies were reviewed today:  Echo 04/27/16 Mild concentric LVH, EF 55-60, inferolateral HK, aortic sclerosis without stenosis, MAC, moderate LAE, severe RAE  LHC 04/26/16 Normal coronary arteries  Echo 10/16 Moderate concentric LVH, vigorous LVF, EF 65-70, normal wall motion, trivial AI, mild MR, moderate LAE, mild RAE  Past Medical History:  Diagnosis Date  . Adenocarcinoma of right lung, stage 1 ([place])  12/11/2014   LLL/pt on 04/26/2016  . Asthma   . Atrial fibrillation ([place])   . Basal cell carcinoma    "mostly face" (04/26/2016)  . Bowel obstruction   . Bradycardia   . COPD (chronic obstructive pulmonary disease) ([facility])    mild  . Diverticulosis of colon   . DVT (deep venous thrombosis) ([facility])    LLE  . Hemorrhoids   . Hx of adenomatous colonic polyps 02/1997  . Hyperlipidemia   . Hypertension   . 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 NSTEMI (non-ST elevated myocardial infarction) ([place]) 04/25/2016  . Osteoarthritis    "back, extremities, left knee, left hip" (04/26/2016)  . Palpitations   . Pleural effusion 11/2014   "after needle ablation for lung cancer"  . Pneumonia 1-2 times  . Squamous carcinoma    "mostly face" (04/26/2016)  . Upper respiratory symptom 03/25/2016    Past Surgical History:  Procedure Laterality Date  . APPENDECTOMY    . BACK SURGERY    . CARDIAC CATHETERIZATION  04/26/2016  . CARDIAC CATHETERIZATION N/A 04/26/2016   Procedure: Coronary/Graft Angiography;  Surgeon: Lorretta Harp, MD;  Location: [place] [facility];  Service: Cardiovascular;  Laterality: N/A;  . CATARACT EXTRACTION W/ INTRAOCULAR LENS IMPLANT Right   . COLECTOMY     "large colon for diverticulitis"  . CYSTOSCOPY     "I was having prostate/bladder trouble"  . INGUINAL HERNIA REPAIR Bilateral   . JOINT REPLACEMENT    . LUMBAR  LAMINECTOMY Right     L4-L5   . SHOULDER OPEN ROTATOR CUFF REPAIR Right   . TONSILLECTOMY    . TOTAL HIP ARTHROPLASTY Right   . UMBILICAL HERNIA REPAIR    . VIDEO BRONCHOSCOPY WITH ENDOBRONCHIAL NAVIGATION N/A 12/18/2014   Procedure: VIDEO BRONCHOSCOPY WITH ENDOBRONCHIAL NAVIGATION ;  Surgeon: Collene Gobble, MD;  Location: [facility];  Service: Thoracic;  Laterality: N/A;    Current Medications: Current Meds  Medication Sig  . albuterol (PROVENTIL HFA;VENTOLIN HFA) 108 (90 Base) MCG/ACT inhaler Inhale 1-2 puffs into the lungs every 6 (six) hours  as needed for wheezing or shortness of breath.  Marland Kitchen alendronate (FOSAMAX) 70 MG tablet Take 70 mg by mouth once a week. Take with a full glass of water on an empty stomach.  Marland Kitchen apixaban (ELIQUIS) 5 MG TABS tablet Take 1 tablet (5 mg total) by mouth 2 (two) times daily.  Marland Kitchen aspirin 81 MG EC tablet Take 1 tablet (81 mg total) by mouth daily.  Marland Kitchen CALCIUM PO Take 1,000 mg by mouth daily.  . cetirizine (ZYRTEC) 10 MG tablet Take 10 mg by mouth every evening.   . Cholecalciferol (VITAMIN D PO) Take 3 capsules by mouth daily.  Marland Kitchen dicyclomine (BENTYL) 10 MG capsule Take 1 capsule (10 mg total) by mouth 4 (four) times daily -  before meals and at bedtime.  . finasteride (PROSCAR) 5 MG tablet Take 10 mg by mouth daily.   . fluticasone (FLONASE) 50 MCG/ACT nasal spray Place 2 sprays into both nostrils every morning.  . Fluticasone-Salmeterol (ADVAIR) 250-50 MCG/DOSE AEPB Inhale 1 puff into the lungs 2 (two) times daily.  Marland Kitchen HYDROcodone-acetaminophen (NORCO/VICODIN) 5-325 MG tablet Take 1-2 tablets by mouth every 4 (four) hours as needed for moderate pain.  . metoprolol tartrate (LOPRESSOR) 25 MG tablet Take 1 tablet (25 mg total) by mouth 2 (two) times daily.  . montelukast (SINGULAIR) 10 MG tablet Take 10 mg by mouth at bedtime.  . predniSONE (DELTASONE) 10 MG tablet Take 10 mg by mouth daily.  . Probiotic Product (PROBIOTIC PO) Take 1 capsule by mouth daily.  . temazepam (RESTORIL) 15 MG capsule Take 15-30 mg by mouth at bedtime as needed for sleep.   . Tiotropium Bromide Monohydrate (SPIRIVA RESPIMAT) 2.5 MCG/ACT AERS Inhale 2 puffs into the lungs daily.  . [DISCONTINUED] aspirin EC 81 MG EC tablet Take 1 tablet (81 mg total) by mouth daily.  . [DISCONTINUED] atorvastatin (LIPITOR) 80 MG tablet Take 1 tablet (80 mg total) by mouth daily at 6 PM.     Allergies:   Sulfa antibiotics and Sulfonamide derivatives   Social History   Social History  . Marital status: Widowed    Spouse name: N/A  . Number of  children: N/A  . Years of education: N/A   Occupational History  . retired physician    Social History Main Topics  . Smoking status: Former Smoker    Packs/day: 1.50    Years: 13.00    Types: Cigarettes    Quit date: 03/29/1956  . Smokeless tobacco: Never Used  . Alcohol use Yes     Comment: 04/26/2016 "nothing in the last 3-4 months; did drink 2 glasses of wine/night"  . Drug use: No  . Sexual activity: No   Other Topics Concern  . None   Social History Narrative   Retired Occupational hygienist industries (Designer, television/film set)   Widower     Family History  Problem Relation Age of Onset  .  Stroke Mother 9  . Lung cancer Father 30  . Leukemia Brother   . Alzheimer's disease Sister   . Autoimmune disease Brother     Polyarteritis nodosa     ROS:   Please see the history of present illness.    Review of Systems  Respiratory: Positive for wheezing.   Hematologic/Lymphatic: Bruises/bleeds easily.   All other systems reviewed and are negative.   EKGs/Labs/Other Test Reviewed:    EKG:  EKG is  ordered today.  The ekg ordered today demonstrates AFib, HR 64, RAD, ant-septal Q waves, QTc 404 ms, no significant changes.   Recent Labs: 03/08/2016: TSH 2.97 03/23/2016: ALT 24 04/26/2016: Hemoglobin 10.9; Platelets 169 04/27/2016: BUN 14; Creatinine, Ser 0.91; Potassium 3.8; Sodium 139   Recent Lipid Panel    Component Value Date/Time   CHOL 143 04/27/2016 0316   TRIG 52 04/27/2016 0316   HDL 54 04/27/2016 0316   CHOLHDL 2.6 04/27/2016 0316   VLDL 10 04/27/2016 0316   LDLCALC 79 04/27/2016 0316     Physical Exam:    VS:  BP 104/60   Pulse 64   Ht 5' 9.5" (1.765 m)   Wt 170 lb 1.9 oz (77.2 kg)   BMI 24.76 kg/m     Wt Readings from Last 3 Encounters:  05/07/16 170 lb 1.9 oz (77.2 kg)  04/25/16 178 lb 5.6 oz (80.9 kg)  03/25/16 178 lb 4.8 oz (80.9 kg)     Physical Exam  Constitutional: He is oriented to person, place, and time. He appears well-developed  and well-nourished. No distress.  HENT:  Head: Normocephalic and atraumatic.  Eyes: No scleral icterus.  Neck: No JVD present.  Cardiovascular: Normal rate.  An irregularly irregular rhythm present.  No murmur heard. Pulmonary/Chest: He has decreased breath sounds.  Diffuse crackles  Musculoskeletal: He exhibits edema.  1+ R ankle edema; R FA site without hematoma or bruit  Neurological: He is alert and oriented to person, place, and time.  Skin: Skin is warm and dry.  Psychiatric: He has a normal mood and affect.    ASSESSMENT:    1. NSTEMI (non-ST elevated myocardial infarction) ([place])   2. Stress-induced cardiomyopathy   3. Permanent atrial fibrillation ([place])   4. Essential hypertension   5. Dyslipidemia    PLAN:    In order of problems listed above:  1. S/p non-STEMI - This was felt to be a stress-induced cardiomyopathy. He had normal coronaries on cardiac catheterization. Given his advanced age and need for anticoagulation with Apixaban for atrial fibrillation, he should not remain on prolonged antiplatelet therapy with aspirin.  -  DC ASA after 30 days of therapy.  -  Continue statin, beta-blocker.  2. Stress-induced cardiopathy - His echocardiogram in the hospital demonstrated normal LV function with inferolateral hypokinesis.    3. Permanent atrial fibrillation - HR is controlled.  Continue Apixaban.  Fall risk is s/w mitigated now as he has 24 hour support at home.  Will arrange CBC, BMET in 6 weeks.   4. HTN - BP is controlled.   5. HL - Given advanced age, I think that mod intensity statin is adequate.  I recommended he reduce the Lipitor to 40 QD.  However, he was previously on Crestor and plans to resume this at 10 mg QD.    -  Change statin to Crestor 10 QD  -  Check Lipids and LFTs in 6 weeks.     Medication Adjustments/Labs and Tests Ordered: Current medicines  are reviewed at length with the patient today.  Concerns regarding medicines are outlined above.   Medication changes, Labs and Tests ordered today are outlined in the Patient Instructions noted below. Patient Instructions  Medication Instructions:  1. YOUR LAST DOSE OF ASPIRIN WILL BE ON 05/29/16; THEN STOP 2. DECREASE LIPITOR 40 MG DAILY; NEW RX HAS BEEN SENT IN  Labwork: 1. 06/18/16 FASTING LIPID AND LIVER PANEL, BMET  Testing/Procedures: NONE  Follow-Up: DR. Lovena Le 3 MONTHS  Any Other Special Instructions Will Be Listed Below (If Applicable).  If you need a refill on your cardiac medications before your next appointment, please call your pharmacy.   Return in about 3 months (around 08/04/2016) for Routine Follow Up with Dr. Lovena Le.   Signed, Richardson Dopp, PA-C  05/07/2016 12:58 PM    [facility] [address], [place], [place]  [address] Phone: [phone]; Fax: [phone]

## 2016-05-07 NOTE — Patient Instructions (Addendum)
Medication Instructions:  1. YOUR LAST DOSE OF ASPIRIN WILL BE ON 05/29/16; THEN STOP 2. DECREASE LIPITOR 40 MG DAILY; NEW RX HAS BEEN SENT IN  Labwork: 1. 06/18/16 FASTING LIPID AND LIVER PANEL, BMET  Testing/Procedures: NONE  Follow-Up: DR. Lovena Le 3 MONTHS  Any Other Special Instructions Will Be Listed Below (If Applicable).  If you need a refill on your cardiac medications before your next appointment, please call your pharmacy.

## 2016-05-11 DIAGNOSIS — J3089 Other allergic rhinitis: Secondary | ICD-10-CM | POA: Diagnosis not present

## 2016-05-11 DIAGNOSIS — J301 Allergic rhinitis due to pollen: Secondary | ICD-10-CM | POA: Diagnosis not present

## 2016-05-12 DIAGNOSIS — H2512 Age-related nuclear cataract, left eye: Secondary | ICD-10-CM | POA: Diagnosis not present

## 2016-05-12 DIAGNOSIS — H40013 Open angle with borderline findings, low risk, bilateral: Secondary | ICD-10-CM | POA: Diagnosis not present

## 2016-05-12 DIAGNOSIS — H25012 Cortical age-related cataract, left eye: Secondary | ICD-10-CM | POA: Diagnosis not present

## 2016-05-12 DIAGNOSIS — Z961 Presence of intraocular lens: Secondary | ICD-10-CM | POA: Diagnosis not present

## 2016-05-13 ENCOUNTER — Ambulatory Visit: Payer: Medicare Other | Attending: Internal Medicine | Admitting: Physical Therapy

## 2016-05-13 ENCOUNTER — Encounter: Payer: Self-pay | Admitting: Physical Therapy

## 2016-05-13 DIAGNOSIS — R2681 Unsteadiness on feet: Secondary | ICD-10-CM

## 2016-05-13 DIAGNOSIS — R293 Abnormal posture: Secondary | ICD-10-CM | POA: Insufficient documentation

## 2016-05-13 DIAGNOSIS — R2689 Other abnormalities of gait and mobility: Secondary | ICD-10-CM | POA: Diagnosis not present

## 2016-05-13 DIAGNOSIS — M6281 Muscle weakness (generalized): Secondary | ICD-10-CM | POA: Diagnosis not present

## 2016-05-14 NOTE — Therapy (Signed)
[facility] [address] [place], [place], [address] Phone: [phone]   Fax:  [phone]  Physical Therapy Evaluation  Patient Details  Name: [name] MRN: [id number] Date of Birth: [birth year] Referring Provider: [name], MD  Encounter Date: 05/13/2016      PT End of Session - 05/13/16 1320    Visit Number 1   Number of Visits 17   Date for PT Re-Evaluation 07/09/16   Authorization Type Medicare G-Code & progress noted every 10 visits   PT Start Time 1100   PT Stop Time 1144   PT Time Calculation (min) 44 min   Equipment Utilized During Treatment Gait belt   Activity Tolerance Patient tolerated treatment well   Behavior During Therapy [facility] for tasks assessed/performed      Past Medical History:  Diagnosis Date  . Adenocarcinoma of right lung, stage 1 ([place]) 12/11/2014   LLL/pt on 04/26/2016  . Asthma   . Atrial fibrillation ([place])   . Basal cell carcinoma    "mostly face" (04/26/2016)  . Bowel obstruction   . Bradycardia   . COPD (chronic obstructive pulmonary disease) (HCC)    mild  . Diverticulosis of colon   . DVT (deep venous thrombosis) (HCC)    LLE  . Hemorrhoids   . Hx of adenomatous colonic polyps 02/1997  . Hyperlipidemia   . Hypertension   . 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[place])    "an allergic pneumonia"  . NSTEMI (non-ST elevated myocardial infarction) ([place]) 04/25/2016  . Osteoarthritis    "back, extremities, left knee, left hip" (04/26/2016)  . Palpitations   . Pleural effusion 11/2014   "after needle ablation for lung cancer"  . Pneumonia 1-2 times  . Squamous carcinoma    "mostly face" (04/26/2016)  . Upper respiratory symptom 03/25/2016    Past Surgical History:  Procedure Laterality Date  . APPENDECTOMY    . BACK SURGERY    . CARDIAC CATHETERIZATION  04/26/2016  . CARDIAC CATHETERIZATION N/A 04/26/2016   Procedure: Coronary/Graft Angiography;  Surgeon: Lorretta Harp, MD;   Location: [facility];  Service: Cardiovascular;  Laterality: N/A;  . CATARACT EXTRACTION W/ INTRAOCULAR LENS IMPLANT Right   . COLECTOMY     "large colon for diverticulitis"  . CYSTOSCOPY     "I was having prostate/bladder trouble"  . INGUINAL HERNIA REPAIR Bilateral   . JOINT REPLACEMENT    . LUMBAR LAMINECTOMY Right     L4-L5   . SHOULDER OPEN ROTATOR CUFF REPAIR Right   . TONSILLECTOMY    . TOTAL HIP ARTHROPLASTY Right   . UMBILICAL HERNIA REPAIR    . VIDEO BRONCHOSCOPY WITH ENDOBRONCHIAL NAVIGATION N/A 12/18/2014   Procedure: VIDEO BRONCHOSCOPY WITH ENDOBRONCHIAL NAVIGATION ;  Surgeon: Collene Gobble, MD;  [facility];  Service: Thoracic;  Laterality: N/A;    There were no vitals filed for this visit.       Subjective Assessment - 05/13/16 1107    Subjective This 83yo male fell 03/10/2016 dislocating his left shoulder and tearing his rotator cuff. He is not a good candidate for surgical repair. He reports increased issues with balance & instability. He was also hospitalized with Stress Induced Cardiomyopathy MI 04/25/2016-04/27/2016. He was referred to PT for evaluation by his PCP on 04/26/2016.   Patient is accompained by: --  Care attendent   Pertinent History Left shoulder dislocation & RCT without repair, hx of old right (dominant) RCT with repair with limited use, Fall  10/13/2015 with right elbow injury ?bursitis vs hematoma, HTN, A-Fib, COPD, asthma, OA, R TKR, L4-5 laminectomy, Adenocarcinoma LLL,    Limitations Lifting;Standing;Walking;House hold activities   Patient Stated Goals walk without a walker if able, improve balance & walking   Currently in Pain? No/denies  reports no pain limited walking, 2-5/10 with increased walking or standing            [facility] PT Assessment - 05/13/16 1100      Assessment   Medical Diagnosis Falls with injuries   Referring Provider [name], MD   Onset Date/Surgical Date 04/26/06  office visit with PT referral   Hand  Dominance Right     Precautions   Precautions Fall     Restrictions   Weight Bearing Restrictions No     Balance Screen   Has the patient fallen in the past 6 months Yes   How many times? 1   Has the patient had a decrease in activity level because of a fear of falling?  No   Is the patient reluctant to leave their home because of a fear of falling?  No     Home Environment   Living Environment Private residence   Living Arrangements Alone   Available Help at Discharge Personal care attendant  4 days/wk 24 hrs/day and 3 days/wk 10 hrs   Type of [place] to enter   Entrance Stairs-Number of Steps 3   Entrance Stairs-Rails Right;Left;Can reach both   Home Layout Two level;Full bath on main level;Able to live on main level with bedroom/bathroom   Alternate Level Stairs-Number of Steps 12   Alternate Level Stairs-Rails Can reach both   [place] - 2 wheels;Other (comment);Shower seat;Grab bars - toilet;Grab bars - tub/shower     Prior Function   Level of Independence Independent;Independent with household mobility with device;Independent with community mobility with device  began using RW with July 2017 fall, was using cane   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 elbows & grip WFL   Strength Assessment Site Shoulder;Hip;Knee;Ankle   Right/Left Shoulder Right;Left   Right Shoulder Flexion 3-/5   Left Shoulder  Flexion 2-/5   Right/Left Hip Right;Left   Right Hip Flexion 5/5   Right Hip Extension 4/5  tested in standing   Right Hip ABduction 4/5  tested in standing   Left Hip Flexion 5/5   Left Hip Extension 4/5  tested in standing   Left Hip ABduction 4/5  tested in standing   Right/Left Knee Right;Left   Right Knee Flexion 4/5   Right Knee Extension 5/5   Left Knee Flexion 4/5   Left Knee Extension 5/5   Right/Left Ankle Right;Left   Right Ankle Dorsiflexion 5/5   Right Ankle Plantar Flexion 3-/5   Left Ankle Dorsiflexion 5/5   Left Ankle Plantar  Flexion 3-/5     Ambulation/Gait   Ambulation/Gait Yes   Ambulation/Gait Assistance 5: Supervision   Ambulation/Gait Assistance Details excessive UE weight bearing on RW   Ambulation Distance (Feet) 200 Feet   Assistive device Rolling walker   Gait Pattern Step-through pattern;Lateral hip instability;Trunk flexed   Ambulation Surface Indoor;Level   Gait velocity 2.07 ft/sec   Gait Comments SpO2 after gait 92% with dyspnea 3/4, after 5 min rest, SpO2 94% & dyspnea 1/4     Standardized Balance Assessment   Standardized Balance Assessment Berg Balance Test;Timed Up and Go Test     Berg Balance Test   Sit to Stand Able to stand  independently using hands   Standing Unsupported Able to stand 2 minutes with supervision   Sitting with Back Unsupported but Feet Supported on Floor or Stool Able to sit safely and securely 2 minutes   Stand to Sit Controls descent by using hands   Transfers Able to transfer safely, definite need of hands   Standing Unsupported with Eyes Closed Able to stand 10 seconds with supervision   Standing Ubsupported with Feet Together Able to place feet together independently but unable to hold for 30 seconds   From Standing, Reach Forward with Outstretched Arm Can reach forward >5 cm safely (2")   From Standing Position, Pick up Object from Floor Unable to try/needs assist to keep balance   From Standing Position,  Turn to Look Behind Over each Shoulder Needs supervision when turning   Turn 360 Degrees Needs close supervision or verbal cueing   Standing Unsupported, Alternately Place Feet on Step/Stool Needs assistance to keep from falling or unable to try   Standing Unsupported, One Foot in [employer] balance while stepping or standing   Standing on One Leg Unable to try or needs assist to prevent fall   Total Score 25     Timed Up and Go Test   Normal TUG (seconds) 12.53  RW unsafe   Cognitive TUG (seconds) 21.67  slowed, stopped counting & 1 error, 73% increase high fall r                             PT Short Term Goals - 05/13/16 1334      PT SHORT TERM GOAL #1   Title Patient demonstrates understanding of initial HEP of OTAGO. (Target Date: 06/11/2016)   Time 4   Period Weeks   Status New     PT SHORT TERM GOAL #2   Title Cognitive Timed Up & Go with RW <20sec with no errors. (Target Date: 06/11/2016)   Time 4   Period Weeks   Status New     PT SHORT TERM GOAL #3   Title Patient ambulates 350' with standard RW or rollator walker with supervision.    Time 4   Period Weeks   Status New     PT SHORT TERM GOAL #4   Title Patient negotiates ramps & curbs with RW or rollator walker with supervision. (Target Date: 06/11/2016)   Time 4   Period Weeks   Status New           PT Long Term Goals - 05/13/16 1329      PT LONG TERM GOAL #1   Title Patient verbalizes and demonstrates understanding of ongoing HEP & fitness plan. (Target Date: 07/09/2016)   Time 8   Period Weeks   Status New  PT LONG TERM GOAL #2   Title Patient ambulates 500' with RW or rollator walker including outdoor surfaces modified independent for community mobility. (Target Date: 07/09/2016)   Time 8   Period Weeks   Status New     PT LONG TERM GOAL #3   Title Berg Balance >36/56 to indicate lower fall risk. (Target Date: 07/09/2016)   Time 8   Period Weeks   Status New     PT LONG  TERM GOAL #4   Title Patient negotiates ramps & curbs with RW or rollator walker modified independent for community access. (Target Date: 07/09/2016)   Time 8   Period Weeks   Status New     PT LONG TERM GOAL #5   Title Cognitive Timed Up & Go with Rolling Walker increases <50% from standard TUG. (Target Date: 07/09/2016)   Time 8   Period Weeks   Status New               Plan - 05/14/16 1056    Clinical Impression Statement This 83yo male had recent fall which resulted in dislocation of shoulder and rotator cuff tear. Patient reports increased instability even with use of rolling walker. Patient has decreased strength impacting balance & gait. He reports not exercising and uncertain of proper exercises. He has high fall risk with Merrilee Jansky Balance of 25/56 which also indicates dependency in ADLs. Patient had 73% increase in cognitive Timed Up-Go with RW from standard TUG along with error and stopped counting which indicates fall risk when distracted. Patient is safer ambulating with rolling walker. PT will assess with rollator walker as option for resting on seat as gait decreased oxygen saturation and increased dyspnea.    Rehab Potential Good   Clinical Impairments Affecting Rehab Potential Left shoulder dislocation & RCT without repair, hx of old right (dominant) RCT with repair with limited use, Fall 10/13/2015 with right elbow injury ?bursitis vs hematoma, HTN, A-Fib, COPD, asthma, OA, R TKR, L4-5 laminectomy, Adenocarcinoma LLL,    PT Frequency 2x / week   PT Duration 8 weeks   PT Treatment/Interventions ADLs/Self Care Home Management;DME Instruction;Gait training;Stair training;Functional mobility training;Therapeutic activities;Therapeutic exercise;Balance training;Neuromuscular re-education;Patient/family education   PT Next Visit Plan trial rollator walker, OTAGO HEP, discuss community fitness [employer] [facility] [employer]   Consulted and Agree with Plan of Care Patient      Patient will benefit  from skilled therapeutic intervention in order to improve the following deficits and impairments:  Decreased mobility, Abnormal gait, Decreased activity tolerance, Decreased balance, Decreased endurance, Decreased knowledge of use of DME, Decreased knowledge of precautions, Impaired flexibility, Decreased strength  Visit Diagnosis: Other abnormalities of gait and mobility  Muscle weakness (generalized)  Abnormal posture  Unsteadiness on feet      G-Codes - 2016-05-17 1339    Functional Assessment Tool Used Timed Up-Go cognitive with RW 21.67sec (73% increase from std TUG) with 1 error & stops counting   Functional Limitation Mobility: Walking and moving around   Mobility: Walking and Moving Around Current Status (G4010) At least 60 percent but less than 80 percent impaired, limited or restricted   Mobility: Walking and Moving Around Goal Status [phone]) At least 40 percent but less than 60 percent impaired, limited or restricted       Problem List Patient Active Problem List   Diagnosis Date Noted  . Stress-induced cardiomyopathy   . NSTEMI (non-ST elevated myocardial infarction) ([place]) 04/25/2016  . Upper respiratory symptom 03/25/2016  . Left arm swelling  03/25/2016  . Troponin I above reference range 03/10/2016  . Shoulder dislocation, left, initial encounter 03/10/2016  . 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 Right elbow pain 12/02/2015  . Cellulitis 10/15/2015  . Pleural effusion 01/23/2015  . Acute respiratory failure with hypoxia ([place]) 01/07/2015  . Permanent atrial fibrillation ([place]) 01/07/2015  . Acute respiratory failure ([place]) 01/07/2015  . Shortness of breath 01/06/2015  . Patellar tendinitis of right knee 12/12/2014  . Adenocarcinoma of right lung, stage 1 ([place]) 12/11/2014  . Traumatic rotator cuff tear 11/07/2014  . Compression fracture of L1 lumbar vertebra (HCC) 11/07/2014  . Lumbar degenerative disc disease  09/11/2014  . Osteoarthritis of right knee 09/11/2014  . Chronic anticoagulation 11/22/2012  . Dyslipidemia 08/06/2008  . COLONIC POLYPS, ADENOMATOUS 07/17/2008  . Essential hypertension 07/17/2008  . BRADYCARDIA 07/17/2008  . HEMORRHOIDS 07/17/2008  . Asthma 07/17/2008  . DIVERTICULOSIS, COLON 07/17/2008  . PALPITATIONS 07/17/2008    Casady Voshell PT, DPT 05/14/2016, 1:40 PM  [facility] [address] [place], [place], [address] Phone: [phone]   Fax:  [phone]  Name: BEAUX WEDEMEYER MRN: [id number] Date of Birth: Apr 11, 1924

## 2016-05-18 DIAGNOSIS — J301 Allergic rhinitis due to pollen: Secondary | ICD-10-CM | POA: Diagnosis not present

## 2016-05-18 DIAGNOSIS — J3089 Other allergic rhinitis: Secondary | ICD-10-CM | POA: Diagnosis not present

## 2016-05-19 ENCOUNTER — Ambulatory Visit: Payer: Medicare Other | Admitting: Physical Therapy

## 2016-05-19 ENCOUNTER — Encounter: Payer: Self-pay | Admitting: Physical Therapy

## 2016-05-19 DIAGNOSIS — R293 Abnormal posture: Secondary | ICD-10-CM

## 2016-05-19 DIAGNOSIS — R2681 Unsteadiness on feet: Secondary | ICD-10-CM | POA: Diagnosis not present

## 2016-05-19 DIAGNOSIS — R2689 Other abnormalities of gait and mobility: Secondary | ICD-10-CM

## 2016-05-19 DIAGNOSIS — M6281 Muscle weakness (generalized): Secondary | ICD-10-CM

## 2016-05-19 NOTE — Therapy (Signed)
[facility] [address] [place], [place], [address] Phone: [phone]   Fax:  [phone]  Physical Therapy Treatment  Patient Details  Name: [name] MRN: [id number] Date of Birth: 1924/11/20 Referring Provider: [name], MD  Encounter Date: 05/19/2016      PT End of Session - 05/19/16 1020    Visit Number 1   Number of Visits 17   Date for PT Re-Evaluation 07/09/16   Authorization Type Medicare G-Code & progress noted every 10 visits   PT Start Time 1018   PT Stop Time 1059   PT Time Calculation (min) 41 min   Equipment Utilized During Treatment Gait belt   Activity Tolerance Patient tolerated treatment well   Behavior During Therapy [facility] for tasks assessed/performed      Past Medical History:  Diagnosis Date  . Adenocarcinoma of right lung, stage 1 ([place]) 12/11/2014   LLL/pt on 04/26/2016  . Asthma   . Atrial fibrillation ([place])   . Basal cell carcinoma    "mostly face" (04/26/2016)  . Bowel obstruction   . Bradycardia   . COPD (chronic obstructive pulmonary disease) (HCC)    mild  . Diverticulosis of colon   . DVT (deep venous thrombosis) (HCC)    LLE  . Hemorrhoids   . Hx of adenomatous colonic polyps 02/1997  . Hyperlipidemia   . Hypertension   . 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[place])    "an allergic pneumonia"  . NSTEMI (non-ST elevated myocardial infarction) ([place]) 04/25/2016  . Osteoarthritis    "back, extremities, left knee, left hip" (04/26/2016)  . Palpitations   . Pleural effusion 11/2014   "after needle ablation for lung cancer"  . Pneumonia 1-2 times  . Squamous carcinoma    "mostly face" (04/26/2016)  . Upper respiratory symptom 03/25/2016    Past Surgical History:  Procedure Laterality Date  . APPENDECTOMY    . BACK SURGERY    . CARDIAC CATHETERIZATION  04/26/2016  . CARDIAC CATHETERIZATION N/A 04/26/2016   Procedure: Coronary/Graft Angiography;  Surgeon: Lorretta Harp, MD;   Location: [facility];  Service: Cardiovascular;  Laterality: N/A;  . CATARACT EXTRACTION W/ INTRAOCULAR LENS IMPLANT Right   . COLECTOMY     "large colon for diverticulitis"  . CYSTOSCOPY     "I was having prostate/bladder trouble"  . INGUINAL HERNIA REPAIR Bilateral   . JOINT REPLACEMENT    . LUMBAR LAMINECTOMY Right     L4-L5   . SHOULDER OPEN ROTATOR CUFF REPAIR Right   . TONSILLECTOMY    . TOTAL HIP ARTHROPLASTY Right   . UMBILICAL HERNIA REPAIR    . VIDEO BRONCHOSCOPY WITH ENDOBRONCHIAL NAVIGATION N/A 12/18/2014   Procedure: VIDEO BRONCHOSCOPY WITH ENDOBRONCHIAL NAVIGATION ;  Surgeon: Collene Gobble, MD;  Location: [facility];  Service: Thoracic;  Laterality: N/A;    There were no vitals filed for this visit.      Subjective Assessment - 05/19/16 1020    Subjective No new complaints. No falls or pain to report.    Pertinent History Left shoulder dislocation & RCT without repair, hx of old right (dominant) RCT with repair with limited use, Fall 10/13/2015 with right elbow injury ?bursitis vs hematoma, HTN, A-Fib, COPD, asthma, OA, R TKR, L4-5 laminectomy, Adenocarcinoma LLL,    Limitations Lifting;Standing;Walking;House hold activities   Patient Stated Goals walk without a walker if able, improve balance & walking   Currently in Pain? No/denies  Balance Exercises - 05/19/16 1101      OTAGO PROGRAM   Head Movements Sitting;5 reps   Neck Movements Sitting;5 reps   Back Extension Standing;5 reps   Trunk Movements Standing;5 reps   Ankle Movements Sitting;10 reps   Knee Extensor 10 reps;Weight (comment)  3 pound ankle weights   Knee Flexor 10 reps;Weight (comment)  3 pound ankle weights   Hip ABductor 10 reps;Weight (comment)  3 pound ankle weights   Ankle Plantorflexors 20 reps, support   Ankle Dorsiflexors 20 reps, support   Knee Bends 10 reps, support   Backwards Walking Support   Walking and Turning Around --  was not in program folder, will find  and issue at next visit   Sideways Walking Assistive device   Tandem Stance 10 seconds, no support   Tandem Walk Support   One Leg Stand 10 seconds, support           PT Education - 05/19/16 1103    Education provided Yes   Education Details initiated [employer] program for [employer]) Educated Patient;Caregiver(s)   Methods Explanation;Demonstration;Verbal cues;Handout   Comprehension Verbalized understanding;Returned demonstration;Verbal cues required          PT Short Term Goals - 05/13/16 1334      PT SHORT TERM GOAL #1   Title Patient demonstrates understanding of initial HEP of OTAGO. (Target Date: 06/11/2016)   Time 4   Period Weeks   Status New     PT SHORT TERM GOAL #2   Title Cognitive Timed Up & Go with RW <20sec with no errors. (Target Date: 06/11/2016)   Time 4   Period Weeks   Status New     PT SHORT TERM GOAL #3   Title Patient ambulates 350' with standard RW or rollator walker with supervision.    Time 4   Period Weeks   Status New     PT SHORT TERM GOAL #4   Title Patient negotiates ramps & curbs with RW or rollator walker with supervision. (Target Date: 06/11/2016)   Time 4   Period Weeks   Status New           PT Long Term Goals - 05/13/16 1329      PT LONG TERM GOAL #1   Title Patient verbalizes and demonstrates understanding of ongoing HEP & fitness plan. (Target Date: 07/09/2016)   Time 8   Period Weeks   Status New     PT LONG TERM GOAL #2   Title Patient ambulates 500' with RW or rollator walker including outdoor surfaces modified independent for community mobility. (Target Date: 07/09/2016)   Time 8   Period Weeks   Status New     PT LONG TERM GOAL #3   Title Berg Balance >36/56 to indicate lower fall risk. (Target Date: 07/09/2016)   Time 8   Period Weeks   Status New     PT LONG TERM GOAL #4   Title Patient negotiates ramps & curbs with RW or rollator walker modified independent for community access. (Target Date:  07/09/2016)   Time 8   Period Weeks   Status New     PT LONG TERM GOAL #5   Title Cognitive Timed Up & Go with Rolling Walker increases <50% from standard TUG. (Target Date: 07/09/2016)   Time 8   Period Weeks   Status New           Plan - 05/19/16 1021    Clinical  Impression Statement Today's skilled session focused on issuing OTAGO program for HEP. Pt already doing some components of OTAGO at home which were review today for correct ex form/technique. Will complete program at next session. Pt is making steady progress toward goals and should benefit from continued PT  to progress toward unmet goals.                Rehab Potential Good   Clinical Impairments Affecting Rehab Potential Left shoulder dislocation & RCT without repair, hx of old right (dominant) RCT with repair with limited use, Fall 10/13/2015 with right elbow injury ?bursitis vs hematoma, HTN, A-Fib, COPD, asthma, OA, R TKR, L4-5 laminectomy, Adenocarcinoma LLL,    PT Frequency 2x / week   PT Duration 8 weeks   PT Treatment/Interventions ADLs/Self Care Home Management;DME Instruction;Gait training;Stair training;Functional mobility training;Therapeutic activities;Therapeutic exercise;Balance training;Neuromuscular re-education;Patient/family education   PT Next Visit Plan complete OTAGO program, trial rollator with gait, continues to work on balance and strengthening   Consulted and Agree with Plan of Care Patient      Patient will benefit from skilled therapeutic intervention in order to improve the following deficits and impairments:  Decreased mobility, Abnormal gait, Decreased activity tolerance, Decreased balance, Decreased endurance, Decreased knowledge of use of DME, Decreased knowledge of precautions, Impaired flexibility, Decreased strength  Visit Diagnosis: Other abnormalities of gait and mobility  Muscle weakness (generalized)  Abnormal posture  Unsteadiness on feet     Problem List Patient Active  Problem List   Diagnosis Date Noted  . Stress-induced cardiomyopathy   . NSTEMI (non-ST elevated myocardial infarction) ([place]) 04/25/2016  . Upper respiratory symptom 03/25/2016  . Left arm swelling 03/25/2016  . Troponin I above reference range 03/10/2016  . Shoulder dislocation, left, initial encounter 03/10/2016  . 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 Right elbow pain 12/02/2015  . Cellulitis 10/15/2015  . Pleural effusion 01/23/2015  . Acute respiratory failure with hypoxia ([place]) 01/07/2015  . Permanent atrial fibrillation ([place]) 01/07/2015  . Acute respiratory failure ([place]) 01/07/2015  . Shortness of breath 01/06/2015  . Patellar tendinitis of right knee 12/12/2014  . Adenocarcinoma of right lung, stage 1 ([place]) 12/11/2014  . Traumatic rotator cuff tear 11/07/2014  . Compression fracture of L1 lumbar vertebra (HCC) 11/07/2014  . Lumbar degenerative disc disease 09/11/2014  . Osteoarthritis of right knee 09/11/2014  . Chronic anticoagulation 11/22/2012  . Dyslipidemia 08/06/2008  . COLONIC POLYPS, ADENOMATOUS 07/17/2008  . Essential hypertension 07/17/2008  . BRADYCARDIA 07/17/2008  . HEMORRHOIDS 07/17/2008  . Asthma 07/17/2008  . DIVERTICULOSIS, COLON 07/17/2008  . PALPITATIONS 07/17/2008    Willow Ora, PTA, [facility] [address] [place], [place] [address] [phone] 05/19/16, 11:06 AM   Name: CYREE CHUONG MRN: [id number] Date of Birth: 06-18-1924

## 2016-05-21 ENCOUNTER — Other Ambulatory Visit: Payer: Medicare Other

## 2016-05-21 ENCOUNTER — Ambulatory Visit: Payer: Medicare Other | Admitting: Physical Therapy

## 2016-05-21 ENCOUNTER — Encounter: Payer: Self-pay | Admitting: Physical Therapy

## 2016-05-21 DIAGNOSIS — M6281 Muscle weakness (generalized): Secondary | ICD-10-CM

## 2016-05-21 DIAGNOSIS — R2689 Other abnormalities of gait and mobility: Secondary | ICD-10-CM

## 2016-05-21 DIAGNOSIS — R293 Abnormal posture: Secondary | ICD-10-CM

## 2016-05-21 DIAGNOSIS — R2681 Unsteadiness on feet: Secondary | ICD-10-CM

## 2016-05-21 NOTE — Therapy (Signed)
[facility] [address] [place], [place], [address] Phone: [phone]   Fax:  [phone]  Physical Therapy Treatment  Patient Details  Name: [name] MRN: [id number] Date of Birth: 1924-10-10 Referring Provider: [name], MD  Encounter Date: 05/21/2016      PT End of Session - 05/21/16 1107    Visit Number 3   Number of Visits 17   Date for PT Re-Evaluation 07/09/16   Authorization Type Medicare G-Code & progress noted every 10 visits   PT Start Time 1102   PT Stop Time 1145   PT Time Calculation (min) 43 min   Equipment Utilized During Treatment Gait belt   Activity Tolerance Patient tolerated treatment well   Behavior During Therapy [facility] for tasks assessed/performed      Past Medical History:  Diagnosis Date  . Adenocarcinoma of right lung, stage 1 ([place]) 12/11/2014   LLL/pt on 04/26/2016  . Asthma   . Atrial fibrillation ([place])   . Basal cell carcinoma    "mostly face" (04/26/2016)  . Bowel obstruction   . Bradycardia   . COPD (chronic obstructive pulmonary disease) ([facility])    mild  . Diverticulosis of colon   . DVT (deep venous thrombosis) ([facility])    LLE  . Hemorrhoids   . Hx of adenomatous colonic polyps 02/1997  . Hyperlipidemia   . Hypertension   . 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[place])    "an allergic pneumonia"  . NSTEMI (non-ST elevated myocardial infarction) ([place]) 04/25/2016  . Osteoarthritis    "back, extremities, left knee, left hip" (04/26/2016)  . Palpitations   . Pleural effusion 11/2014   "after needle ablation for lung cancer"  . Pneumonia 1-2 times  . Squamous carcinoma    "mostly face" (04/26/2016)  . Upper respiratory symptom 03/25/2016    Past Surgical History:  Procedure Laterality Date  . APPENDECTOMY    . BACK SURGERY    . CARDIAC CATHETERIZATION  04/26/2016  . CARDIAC CATHETERIZATION N/A 04/26/2016   Procedure: Coronary/Graft Angiography;  Surgeon: Lorretta Harp, MD;   Location: [facility];  Service: Cardiovascular;  Laterality: N/A;  . CATARACT EXTRACTION W/ INTRAOCULAR LENS IMPLANT Right   . COLECTOMY     "large colon for diverticulitis"  . CYSTOSCOPY     "I was having prostate/bladder trouble"  . INGUINAL HERNIA REPAIR Bilateral   . JOINT REPLACEMENT    . LUMBAR LAMINECTOMY Right     L4-L5   . SHOULDER OPEN ROTATOR CUFF REPAIR Right   . TONSILLECTOMY    . TOTAL HIP ARTHROPLASTY Right   . UMBILICAL HERNIA REPAIR    . VIDEO BRONCHOSCOPY WITH ENDOBRONCHIAL NAVIGATION N/A 12/18/2014   Procedure: VIDEO BRONCHOSCOPY WITH ENDOBRONCHIAL NAVIGATION ;  Surgeon: Collene Gobble, MD;  Location: [facility];  Service: Thoracic;  Laterality: N/A;    There were no vitals filed for this visit.      Subjective Assessment - 05/21/16 1106    Subjective No new complaints. No falls. Does have some right knee pain today due to cold/wet weather.    Patient is accompained by: Family member  care attendant   Pertinent History Left shoulder dislocation & RCT without repair, hx of old right (dominant) RCT with repair with limited use, Fall 10/13/2015 with right elbow injury ?bursitis vs hematoma, HTN, A-Fib, COPD, asthma, OA, R TKR, L4-5 laminectomy, Adenocarcinoma LLL,    Limitations Lifting;Standing;Walking;House hold activities   Patient Stated Goals walk without a walker if  able, improve balance & walking   Currently in Pain? Yes   Pain Score 3   3/10 at rest, 5/10 with mobility   Pain Location Knee   Pain Orientation Right   Pain Descriptors / Indicators Aching;Sore   Pain Type Chronic pain   Pain Onset More than a month ago   Pain Frequency Occasional   Aggravating Factors  weather, cold, increased activity   Pain Relieving Factors rest, tylenol            [facility] Adult PT Treatment/Exercise - 05/21/16 1116      Transfers   Transfers Sit to Stand;Stand to Sit   Sit to Stand 5: Supervision;With upper extremity assist;From chair/3-in-1;From bed   Stand  to Sit 5: Supervision;With upper extremity assist;To bed;To chair/3-in-1     Ambulation/Gait   Ambulation/Gait Yes   Ambulation/Gait Assistance 5: Supervision   Ambulation/Gait Assistance Details cues on use of rollator with gait including placement in rollator, step length and posture. cues on hand placement on brakes with gait as well.    Ambulation Distance (Feet) 100 Feet  x 2 RW; 210 x 2 with rollator   Assistive device Rolling walker;Rollator   Gait Pattern Step-through pattern;Lateral hip instability;Trunk flexed   Ambulation Surface Level;Indoor     Knee/Hip Exercises: Supine   Bridges AROM;Strengthening;Both;1 set;10 reps;Limitations   Bridges Limitations cues on ex form and hold times; single leg bridge with other leg straight on mat- limited reps on right side due to knee pain   Single Leg Bridge AROM;Strengthening;Both;1 set;10 reps;5 reps  5 reps with right, 10 reps with left   Straight Leg Raises AROM;AAROM;Strengthening;Both;1 set;10 reps;Other (comment);Limitations  AA and no weight on right leg   Straight Leg Raises Limitations cues on form and for controlled lowering. assist needed with right leg.   Other Supine Knee/Hip Exercises hip abduct/adduct with green band, 5 sec holds x 10 reps             Balance Exercises - 05/21/16 1108      OTAGO PROGRAM   Walking and Turning Around Assistive device  counter top   Heel Walking Support   Toe Walk Support   Sit to Stand 5 reps, bilateral support  limited today by right knee pain   Overall OTAGO Comments cues on ex form and technique with new ex's. will need to add these 4 at next visit as pt forgot to bring in book today.              PT Short Term Goals - 05/13/16 1334      PT SHORT TERM GOAL #1   Title Patient demonstrates understanding of initial HEP of OTAGO. (Target Date: 06/11/2016)   Time 4   Period Weeks   Status New     PT SHORT TERM GOAL #2   Title Cognitive Timed Up & Go with RW <20sec  with no errors. (Target Date: 06/11/2016)   Time 4   Period Weeks   Status New     PT SHORT TERM GOAL #3   Title Patient ambulates 350' with standard RW or rollator walker with supervision.    Time 4   Period Weeks   Status New     PT SHORT TERM GOAL #4   Title Patient negotiates ramps & curbs with RW or rollator walker with supervision. (Target Date: 06/11/2016)   Time 4   Period Weeks   Status New  PT Long Term Goals - 05/13/16 1329      PT LONG TERM GOAL #1   Title Patient verbalizes and demonstrates understanding of ongoing HEP & fitness plan. (Target Date: 07/09/2016)   Time 8   Period Weeks   Status New     PT LONG TERM GOAL #2   Title Patient ambulates 500' with RW or rollator walker including outdoor surfaces modified independent for community mobility. (Target Date: 07/09/2016)   Time 8   Period Weeks   Status New     PT LONG TERM GOAL #3   Title Berg Balance >36/56 to indicate lower fall risk. (Target Date: 07/09/2016)   Time 8   Period Weeks   Status New     PT LONG TERM GOAL #4   Title Patient negotiates ramps & curbs with RW or rollator walker modified independent for community access. (Target Date: 07/09/2016)   Time 8   Period Weeks   Status New     PT LONG TERM GOAL #5   Title Cognitive Timed Up & Go with Rolling Walker increases <50% from standard TUG. (Target Date: 07/09/2016)   Time 8   Period Weeks   Status New           Plan - 05/21/16 1107    Clinical Impression Statement Today's skilled session continued to work on [place] with final 4 exercises provided and performed (will add to folder when pt brings it in next session). Remainder of session addressed gait with rollator and on Ln E strengthening. No increase in knee pain reported. Pt is making steady progress toward goals and should benefit from continued PT to progress toward unmet goals.                        Rehab Potential Good   Clinical Impairments Affecting  Rehab Potential Left shoulder dislocation & RCT without repair, hx of old right (dominant) RCT with repair with limited use, Fall 10/13/2015 with right elbow injury ?bursitis vs hematoma, HTN, A-Fib, COPD, asthma, OA, R TKR, L4-5 laminectomy, Adenocarcinoma LLL,    PT Frequency 2x / week   PT Duration 8 weeks   PT Treatment/Interventions ADLs/Self Care Home Management;DME Instruction;Gait training;Stair training;Functional mobility training;Therapeutic activities;Therapeutic exercise;Balance training;Neuromuscular re-education;Patient/family education   PT Next Visit Plan continue with rollator with gait, continues to work on balance and strengthening   Consulted and Agree with Plan of Care Patient      Patient will benefit from skilled therapeutic intervention in order to improve the following deficits and impairments:  Decreased mobility, Abnormal gait, Decreased activity tolerance, Decreased balance, Decreased endurance, Decreased knowledge of use of DME, Decreased knowledge of precautions, Impaired flexibility, Decreased strength  Visit Diagnosis: Other abnormalities of gait and mobility  Muscle weakness (generalized)  Abnormal posture  Unsteadiness on feet     Problem List Patient Active Problem List   Diagnosis Date Noted  . Stress-induced cardiomyopathy   . NSTEMI (non-ST elevated myocardial infarction) ([place]) 04/25/2016  . Upper respiratory symptom 03/25/2016  . Left arm swelling 03/25/2016  . Troponin I above reference range 03/10/2016  . Shoulder dislocation, left, initial encounter 03/10/2016  . 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 Right elbow pain 12/02/2015  . Cellulitis 10/15/2015  . Pleural effusion 01/23/2015  . Acute respiratory failure with hypoxia ([place]) 01/07/2015  . Permanent atrial fibrillation ([place]) 01/07/2015  . Acute respiratory failure ([place]) 01/07/2015  .  Shortness of breath 01/06/2015  . Patellar  tendinitis of right knee 12/12/2014  . Adenocarcinoma of right lung, stage 1 ([place]) 12/11/2014  . Traumatic rotator cuff tear 11/07/2014  . Compression fracture of L1 lumbar vertebra ([facility]) 11/07/2014  . Lumbar degenerative disc disease 09/11/2014  . Osteoarthritis of right knee 09/11/2014  . Chronic anticoagulation 11/22/2012  . Dyslipidemia 08/06/2008  . COLONIC POLYPS, ADENOMATOUS 07/17/2008  . Essential hypertension 07/17/2008  . BRADYCARDIA 07/17/2008  . HEMORRHOIDS 07/17/2008  . Asthma 07/17/2008  . DIVERTICULOSIS, COLON 07/17/2008  . PALPITATIONS 07/17/2008    Willow Ora, PTA, [facility] [address] [place], [place] [address] [phone] 05/21/16, 11:13 PM   Name: [name] MRN: [id number] Date of Birth: 15-Nov-1924

## 2016-05-23 ENCOUNTER — Encounter (HOSPITAL_COMMUNITY): Payer: Self-pay | Admitting: Pharmacy Technician

## 2016-05-23 ENCOUNTER — Emergency Department (HOSPITAL_COMMUNITY)
Admission: EM | Admit: 2016-05-23 | Discharge: 2016-05-23 | Disposition: A | Discharge status: Home / Self Care | Payer: Medicare Other | Attending: Emergency Medicine | Admitting: Emergency Medicine

## 2016-05-23 ENCOUNTER — Emergency Department (HOSPITAL_COMMUNITY): Payer: Medicare Other

## 2016-05-23 DIAGNOSIS — Z87891 Personal history of nicotine dependence: Secondary | ICD-10-CM | POA: Diagnosis not present

## 2016-05-23 DIAGNOSIS — I10 Essential (primary) hypertension: Secondary | ICD-10-CM | POA: Diagnosis not present

## 2016-05-23 DIAGNOSIS — S81012A Laceration without foreign body, left knee, initial encounter: Secondary | ICD-10-CM | POA: Insufficient documentation

## 2016-05-23 DIAGNOSIS — J449 Chronic obstructive pulmonary disease, unspecified: Secondary | ICD-10-CM | POA: Insufficient documentation

## 2016-05-23 DIAGNOSIS — Y939 Activity, unspecified: Secondary | ICD-10-CM | POA: Diagnosis not present

## 2016-05-23 DIAGNOSIS — Z7982 Long term (current) use of aspirin: Secondary | ICD-10-CM | POA: Insufficient documentation

## 2016-05-23 DIAGNOSIS — Y999 Unspecified external cause status: Secondary | ICD-10-CM | POA: Diagnosis not present

## 2016-05-23 DIAGNOSIS — Z96641 Presence of right artificial hip joint: Secondary | ICD-10-CM | POA: Insufficient documentation

## 2016-05-23 DIAGNOSIS — T148XXA Other injury of unspecified body region, initial encounter: Secondary | ICD-10-CM

## 2016-05-23 DIAGNOSIS — W1830XA Fall on same level, unspecified, initial encounter: Secondary | ICD-10-CM | POA: Insufficient documentation

## 2016-05-23 DIAGNOSIS — Y929 Unspecified place or not applicable: Secondary | ICD-10-CM | POA: Insufficient documentation

## 2016-05-23 DIAGNOSIS — Z79899 Other long term (current) drug therapy: Secondary | ICD-10-CM | POA: Insufficient documentation

## 2016-05-23 DIAGNOSIS — S0083XA Contusion of other part of head, initial encounter: Secondary | ICD-10-CM | POA: Diagnosis not present

## 2016-05-23 DIAGNOSIS — Z85828 Personal history of other malignant neoplasm of skin: Secondary | ICD-10-CM | POA: Insufficient documentation

## 2016-05-23 DIAGNOSIS — S098XXA Other specified injuries of head, initial encounter: Secondary | ICD-10-CM | POA: Diagnosis not present

## 2016-05-23 DIAGNOSIS — S0181XA Laceration without foreign body of other part of head, initial encounter: Secondary | ICD-10-CM | POA: Diagnosis not present

## 2016-05-23 DIAGNOSIS — S0990XA Unspecified injury of head, initial encounter: Secondary | ICD-10-CM | POA: Diagnosis not present

## 2016-05-23 LAB — CBC WITH DIFFERENTIAL/PLATELET
BASOS ABS: 0 10*3/uL (ref 0.0–0.1)
Basophils Relative: 0 %
EOS ABS: 0.1 10*3/uL (ref 0.0–0.7)
Eosinophils Relative: 1 %
HCT: 35.7 % — ABNORMAL LOW (ref 39.0–52.0)
Hemoglobin: 11.1 g/dL — ABNORMAL LOW (ref 13.0–17.0)
Lymphocytes Relative: 14 %
Lymphs Abs: 1 10*3/uL (ref 0.7–4.0)
MCH: 29.7 pg (ref 26.0–34.0)
MCHC: 31.1 g/dL (ref 30.0–36.0)
MCV: 95.5 fL (ref 78.0–100.0)
MONO ABS: 1.1 10*3/uL — AB (ref 0.1–1.0)
Monocytes Relative: 15 %
Neutro Abs: 4.9 10*3/uL (ref 1.7–7.7)
Neutrophils Relative %: 70 %
Platelets: 120 10*3/uL — ABNORMAL LOW (ref 150–400)
RBC: 3.74 MIL/uL — AB (ref 4.22–5.81)
RDW: 14.8 % (ref 11.5–15.5)
WBC: 7.1 10*3/uL (ref 4.0–10.5)

## 2016-05-23 LAB — BASIC METABOLIC PANEL
ANION GAP: 11 (ref 5–15)
BUN: 18 mg/dL (ref 6–20)
CHLORIDE: 102 mmol/L (ref 101–111)
CO2: 28 mmol/L (ref 22–32)
CREATININE: 1.05 mg/dL (ref 0.61–1.24)
Calcium: 9.4 mg/dL (ref 8.9–10.3)
GLUCOSE: 78 mg/dL (ref 65–99)
Potassium: 4.2 mmol/L (ref 3.5–5.1)
Sodium: 141 mmol/L (ref 135–145)

## 2016-05-23 LAB — PROTIME-INR
INR: 1.31
PROTHROMBIN TIME: 16.4 s — AB (ref 11.4–15.2)

## 2016-05-23 MED ORDER — LIDOCAINE-EPINEPHRINE (PF) 2 %-1:200000 IJ SOLN
20.0000 mL | Freq: Once | INTRAMUSCULAR | Status: AC
Start: 2016-05-23 — End: 2016-05-23
  Administered 2016-05-23: 20 mL
  Filled 2016-05-23: qty 20

## 2016-05-23 MED ORDER — BACITRACIN ZINC 500 UNIT/GM EX OINT: 1.0000 "application " | TOPICAL_OINTMENT | Freq: Two times a day (BID) | CUTANEOUS | 0 refills | Status: DC

## 2016-05-23 NOTE — ED Provider Notes (Signed)
[facility] DEPT Provider Note   CSN: [id number] Arrival date & time: 05/23/16  6599  By signing my name below, I, Reola Mosher, attest that this documentation has been prepared under the direction and in the presence of Varney Biles, MD. Electronically Signed: Reola Mosher, ED Scribe. 05/23/16. 2:46 AM.  History   Chief Complaint Chief Complaint  Patient presents with  . Fall   The history is provided by the patient. No language interpreter was used.    HPI Comments: [name] is a 83 y.o. male BIB EMS, with a h/o AFIB on Eliquis, COPD, adenocarcinoma of the right lung s/p ablation, and prior NSTEMI on 04/25/16, who presents to the Emergency Department complaining s/p mechanical, ground-level fall which occurred tonight prior to arrival. Per pt, he was in his home tonight when he woke up from sleep, had a "swig of vodka" and fell while ambulating with his walker to his bathroom. No LOC. He sustained a laceration to the forehead and left knee after hitting the ground, but there are no other reported areas of pain or injury. EMS applied a c-collar and controlled the bleeding to his wounds with pressure dressings while en route. Pt has been ambulatory since the incident. He denies any complaints at this time. Tetanus is UTD.   Past Medical History:  Diagnosis Date  . Adenocarcinoma of right lung, stage 1 ([place]) 12/11/2014   LLL/pt on 04/26/2016  . Asthma   . Atrial fibrillation ([place])   . Basal cell carcinoma    "mostly face" (04/26/2016)  . Bowel obstruction   . Bradycardia   . COPD (chronic obstructive pulmonary disease) (HCC)    mild  . Diverticulosis of colon   . DVT (deep venous thrombosis) (HCC)    LLE  . Hemorrhoids   . Hx of adenomatous colonic polyps 02/1997  . Hyperlipidemia   . Hypertension   . 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 NSTEMI (non-ST elevated myocardial infarction) ([place]) 04/25/2016  . Osteoarthritis    "back,  extremities, left knee, left hip" (04/26/2016)  . Palpitations   . Pleural effusion 11/2014   "after needle ablation for lung cancer"  . Pneumonia 1-2 times  . Squamous carcinoma    "mostly face" (04/26/2016)  . Upper respiratory symptom 03/25/2016   Patient Active Problem List   Diagnosis Date Noted  . Stress-induced cardiomyopathy   . NSTEMI (non-ST elevated myocardial infarction) ([place]) 04/25/2016  . Upper respiratory symptom 03/25/2016  . Left arm swelling 03/25/2016  . Troponin I above reference range 03/10/2016  . Shoulder dislocation, left, initial encounter 03/10/2016  . 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 Right elbow pain 12/02/2015  . Cellulitis 10/15/2015  . Pleural effusion 01/23/2015  . Acute respiratory failure with hypoxia ([place]) 01/07/2015  . Permanent atrial fibrillation ([place]) 01/07/2015  . Acute respiratory failure ([place]) 01/07/2015  . Shortness of breath 01/06/2015  . Patellar tendinitis of right knee 12/12/2014  . Adenocarcinoma of right lung, stage 1 ([place]) 12/11/2014  . Traumatic rotator cuff tear 11/07/2014  . Compression fracture of L1 lumbar vertebra (HCC) 11/07/2014  . Lumbar degenerative disc disease 09/11/2014  . Osteoarthritis of right knee 09/11/2014  . Chronic anticoagulation 11/22/2012  . Dyslipidemia 08/06/2008  . COLONIC POLYPS, ADENOMATOUS 07/17/2008  . Essential hypertension 07/17/2008  . BRADYCARDIA 07/17/2008  . HEMORRHOIDS 07/17/2008  . Asthma 07/17/2008  . DIVERTICULOSIS, COLON 07/17/2008  . PALPITATIONS 07/17/2008  Past Surgical History:  Procedure Laterality Date  . APPENDECTOMY    . BACK SURGERY    . CARDIAC CATHETERIZATION  04/26/2016  . CARDIAC CATHETERIZATION N/A 04/26/2016   Procedure: Coronary/Graft Angiography;  Surgeon: Lorretta Harp, MD;  Location: [facility];  Service: Cardiovascular;  Laterality: N/A;  . CATARACT EXTRACTION W/ INTRAOCULAR LENS IMPLANT  Right   . COLECTOMY     "large colon for diverticulitis"  . CYSTOSCOPY     "I was having prostate/bladder trouble"  . INGUINAL HERNIA REPAIR Bilateral   . JOINT REPLACEMENT    . LUMBAR LAMINECTOMY Right     L4-L5   . SHOULDER OPEN ROTATOR CUFF REPAIR Right   . TONSILLECTOMY    . TOTAL HIP ARTHROPLASTY Right   . UMBILICAL HERNIA REPAIR    . VIDEO BRONCHOSCOPY WITH ENDOBRONCHIAL NAVIGATION N/A 12/18/2014   Procedure: VIDEO BRONCHOSCOPY WITH ENDOBRONCHIAL NAVIGATION ;  Surgeon: Collene Gobble, MD;  Location: [facility];  Service: Thoracic;  Laterality: N/A;    Home Medications    Prior to Admission medications   Medication Sig Start Date End Date Taking? Authorizing Provider  albuterol (PROVENTIL HFA;VENTOLIN HFA) 108 (90 Base) MCG/ACT inhaler Inhale 1-2 puffs into the lungs every 6 (six) hours as needed for wheezing or shortness of breath.   Yes Historical Provider, MD  alendronate (FOSAMAX) 70 MG tablet Take 70 mg by mouth once a week. Take with a full glass of water on an empty stomach.   Yes Historical Provider, MD  apixaban (ELIQUIS) 5 MG TABS tablet Take 1 tablet (5 mg total) by mouth 2 (two) times daily. 04/27/16  Yes Cheryln Manly, NP  aspirin 81 MG EC tablet Take 1 tablet (81 mg total) by mouth daily. 05/07/16 05/29/16 Yes Scott Joylene Draft, PA-C  CALCIUM PO Take 1,000 mg by mouth daily.   Yes Historical Provider, MD  cetirizine (ZYRTEC) 10 MG tablet Take 10 mg by mouth every evening.    Yes Historical Provider, MD  Cholecalciferol (VITAMIN D PO) Take 3 capsules by mouth daily.   Yes Historical Provider, MD  dicyclomine (BENTYL) 10 MG capsule Take 1 capsule (10 mg total) by mouth 4 (four) times daily -  before meals and at bedtime. 03/08/16  Yes Ladene Artist, MD  finasteride (PROSCAR) 5 MG tablet Take 5 mg by mouth daily.    Yes Historical Provider, MD  fluticasone (FLONASE) 50 MCG/ACT nasal spray Place 2 sprays into both nostrils every morning.   Yes Historical Provider, MD    Fluticasone-Salmeterol (ADVAIR) 250-50 MCG/DOSE AEPB Inhale 1 puff into the lungs 2 (two) times daily.   Yes Historical Provider, MD  HYDROcodone-acetaminophen (NORCO/VICODIN) 5-325 MG tablet Take 1-2 tablets by mouth every 4 (four) hours as needed for moderate pain. 03/10/16  Yes Geradine Girt, DO  metoprolol tartrate (LOPRESSOR) 25 MG tablet Take 1 tablet (25 mg total) by mouth 2 (two) times daily. 04/27/16  Yes Cheryln Manly, NP  montelukast (SINGULAIR) 10 MG tablet Take 10 mg by mouth at bedtime.   Yes Historical Provider, MD  predniSONE (DELTASONE) 10 MG tablet Take 10 mg by mouth daily. 09/23/15  Yes Historical Provider, MD  Probiotic Product (PROBIOTIC PO) Take 1 capsule by mouth daily.   Yes Historical Provider, MD  rosuvastatin (CRESTOR) 10 MG tablet Take 1 tablet (10 mg total) by mouth daily. 05/07/16 05/02/17 Yes Scott T Weaver, PA-C  temazepam (RESTORIL) 15 MG capsule Take 15-30 mg by mouth at bedtime.  Yes Historical Provider, MD  Tiotropium Bromide Monohydrate (SPIRIVA RESPIMAT) 2.5 MCG/ACT AERS Inhale 2 puffs into the lungs daily.   Yes Historical Provider, MD  bacitracin ointment Apply 1 application topically 2 (two) times daily. 05/23/16   Varney Biles, MD   Family History Family History  Problem Relation Age of Onset  . Stroke Mother 69  . Lung cancer Father 78  . Leukemia Brother   . Alzheimer's disease Sister   . Autoimmune disease Brother     Polyarteritis nodosa   Social History Social History  Substance Use Topics  . Smoking status: Former Smoker    Packs/day: 1.50    Years: 13.00    Types: Cigarettes    Quit date: 03/29/1956  . Smokeless tobacco: Never Used  . Alcohol use Yes     Comment: 04/26/2016 "nothing in the last 3-4 months; did drink 2 glasses of wine/night"   Allergies   Sulfa antibiotics  Review of Systems Review of Systems A complete 10 system review of systems was obtained and all systems are negative except as noted in the HPI and PMH.    Physical Exam Updated Vital Signs BP 162/81 (BP Location: Left Arm)   Pulse 74   Temp 98.3 F (36.8 C)   Resp 16   Ht '5\' 9"'$  (1.753 m)   Wt 170 lb (77.1 kg)   SpO2 99%   BMI 25.10 kg/m   Physical Exam  Constitutional: He appears well-developed and well-nourished. No distress.  HENT:  Head: Normocephalic.  6cm laceration to the forehead with some active bleeding.   Eyes: Conjunctivae are normal.  Neck: Normal range of motion.  No midline c-spine tenderness.   Cardiovascular: Normal rate, regular rhythm and normal heart sounds.   No murmur heard. Pulmonary/Chest: Effort normal and breath sounds normal. No respiratory distress. He has no wheezes. He has no rales.  No ecchymosis over the chest or abdomen.   Abdominal: Soft. He exhibits no distension. There is no tenderness.  No ecchymosis over the chest or abdomen.   Musculoskeletal: Normal range of motion. He exhibits tenderness.  6cm laceration to the left knee with irregular edges. Pt also has a 3cm skin tear to the anterior patella. Upper and lower extremity exam reveals no gross deformity or tenderness to palpation, except over the left knee region. Pelvis is stable.   Neurological: He is alert.  Skin: No pallor.  Psychiatric: He has a normal mood and affect. His behavior is normal.  Nursing note and vitals reviewed.  ED Treatments / Results  DIAGNOSTIC STUDIES: Oxygen Saturation is 97% on RA, normal by my interpretation.   COORDINATION OF CARE: 2:46 AM-Discussed next steps with pt including CT head, basic labs, and laceration repairs. Pt verbalized understanding and is agreeable with the plan.   Labs (all labs ordered are listed, but only abnormal results are displayed) Labs Reviewed  CBC WITH DIFFERENTIAL/PLATELET - Abnormal; Notable for the following:       Result Value   RBC 3.74 (*)    Hemoglobin 11.1 (*)    HCT 35.7 (*)    Platelets 120 (*)    Monocytes Absolute 1.1 (*)    All other components within  normal limits  PROTIME-INR - Abnormal; Notable for the following:    Prothrombin Time 16.4 (*)    All other components within normal limits  BASIC METABOLIC PANEL   EKG EKG: nonspecific ST and T waves changes, atrial fibrillation, rate 67.  Radiology Ct Head Wo Contrast  Result Date: 05/23/2016 CLINICAL DATA:  83 year old male with fall and trauma to the head. EXAM: CT HEAD WITHOUT CONTRAST TECHNIQUE: Contiguous axial images were obtained from the base of the skull through the vertex without intravenous contrast. COMPARISON:  Brain MRI dated 12/07/2010 FINDINGS: Brain: There is moderate age-related atrophy and chronic microvascular ischemic changes. There is no acute intracranial hemorrhage. No mass effect or midline shift noted. No extra-axial fluid collection. Slight prominence of the posterior fossa CSF space. Vascular: No hyperdense vessel or unexpected calcification. Skull: Normal. Negative for fracture or focal lesion. Sinuses/Orbits: No acute finding. Other: Small left forehead laceration and contusion. IMPRESSION: 1. No acute intracranial hemorrhage. 2. Age-related atrophy and chronic microvascular ischemic changes. Electronically Signed   By: Anner Crete M.D.   On: 05/23/2016 05:11   Dg Knee Complete 4 Views Left  Result Date: 05/23/2016 CLINICAL DATA:  Mechanical fall with laceration to the left knee EXAM: LEFT KNEE - COMPLETE 4+ VIEW COMPARISON:  None. FINDINGS: No acute fracture or dislocation. Disc space calcification consistent with chondrocalcinosis. Mild tricompartmental narrowing. Superior and inferior patellar spurring. No large effusion. Vascular calcifications. IMPRESSION: 1. No acute osseous abnormality 2. Chondrocalcinosis 3. Mild degenerative changes Electronically Signed   By: Donavan Foil M.D.   On: 05/23/2016 04:22    Procedures Procedures   LACERATION REPAIR Performed by: Varney Biles Authorized by: Varney Biles Consent: Verbal consent obtained. Risks  and benefits: risks, benefits and alternatives were discussed Consent given by: patient Patient identity confirmed: provided demographic data Prepped and Draped in normal sterile fashion Wound explored  Laceration Location: FOREHEAD  Laceration Length: 7 cm  No Foreign Bodies seen or palpated  Anesthesia: local infiltration  Local anesthetic: lidocaine 2 % with epinephrine  Anesthetic total: 5  ml  Irrigation method: syringe Amount of cleaning: standard  Skin closure: primary with 5-0 vicryl rapide  Number of sutures: 8  Technique: simple inturrupted  Patient tolerance: Patient tolerated the procedure well. Pt had some bleeding and hematoma.  LACERATION REPAIR Performed by: Varney Biles Authorized by: Varney Biles Consent: Verbal consent obtained. Risks and benefits: risks, benefits and alternatives were discussed Consent given by: patient Patient identity confirmed: provided demographic data Prepped and Draped in normal sterile fashion Wound explored  Laceration Location: L knee  Laceration Length: 6 cm  No Foreign Bodies seen or palpated  Anesthesia: local infiltration  Local anesthetic: lidocaine 2 % with epinephrine  Anesthetic total: 6 ml  Irrigation method: syringe Amount of cleaning: standard  Skin closure: primary  Number of sutures: 6  Technique: simple inturrupted, vicryl rapide  Patient tolerance: Patient tolerated the procedure well with no immediate complications.   Medications Ordered in ED Medications  lidocaine-EPINEPHrine (XYLOCAINE W/EPI) 2 %-1:200000 (PF) injection 20 mL (20 mLs Infiltration Given by Other 05/23/16 0304)   Initial Impression / Assessment and Plan / ED Course  I have reviewed the triage vital signs and the nursing notes.  Pertinent labs & imaging results that were available during my care of the patient were reviewed by me and considered in my medical decision making (see chart for details).     I  personally performed the services described in this documentation, which was scribed in my presence. The recorded information has been reviewed and is accurate.  Pt comes in after a fall. Looks like he had some alcohol last night, and that at least contributed some. Pt is on NOAC - so we will get CT head. Knee hurts- Xrays ordered.  Pt has  2 large lacs, that were repaired.  Final Clinical Impressions(s) / ED Diagnoses   Final diagnoses:  Contusion of face, initial encounter  Facial laceration, initial encounter  Hematoma   New Prescriptions Discharge Medication List as of 05/23/2016  6:47 AM    START taking these medications   Details  bacitracin ointment Apply 1 application topically 2 (two) times daily., Starting Sun 05/23/2016, Print           Varney Biles, MD 05/25/16 [phone]

## 2016-05-23 NOTE — ED Notes (Signed)
Family at bedside. 

## 2016-05-23 NOTE — ED Notes (Signed)
ED Provider at bedside. 

## 2016-05-23 NOTE — ED Triage Notes (Signed)
Pt arrives via ems from home with reports of mechanical fall. Pt states he did not lose conciousness. Pt does endorse drinking vodka before going to bed. Pt with Lac to head and lac to L knee. Skin tear R inner elbow. Pt does take Eliquis for Afib.

## 2016-05-23 NOTE — ED Notes (Signed)
Patient transported to X-ray & CT °

## 2016-05-23 NOTE — Discharge Instructions (Signed)
We saw you in the ER for your WOUND. Please read the instructions provided on wound care. Keep the area clean and dry, apply bacitracin ointment daily and take the medications provided. RETURN TO THE ER IF THERE IS INCREASED PAIN, REDNESS, PUS COMING OUT from the wound site.   PLEASE LEAVE THE STERI STRIPS ON - THEY WILL FALL ON THEIR OWN.  THE SUTURES WILL DISSOLVE IN 10-14 DAYS.  See your doctor in 2 weeks.

## 2016-05-24 ENCOUNTER — Ambulatory Visit: Payer: Medicare Other | Admitting: Physical Therapy

## 2016-05-25 ENCOUNTER — Telehealth: Payer: Self-pay | Admitting: Internal Medicine

## 2016-05-25 NOTE — Telephone Encounter (Signed)
Spoke with patient and confirm appointment change

## 2016-05-27 ENCOUNTER — Ambulatory Visit: Payer: Medicare Other | Admitting: Physical Therapy

## 2016-06-02 ENCOUNTER — Ambulatory Visit: Payer: Medicare Other | Admitting: Physical Therapy

## 2016-06-02 DIAGNOSIS — Z6824 Body mass index (BMI) 24.0-24.9, adult: Secondary | ICD-10-CM | POA: Diagnosis not present

## 2016-06-02 DIAGNOSIS — I480 Paroxysmal atrial fibrillation: Secondary | ICD-10-CM | POA: Diagnosis not present

## 2016-06-02 DIAGNOSIS — R2689 Other abnormalities of gait and mobility: Secondary | ICD-10-CM | POA: Diagnosis not present

## 2016-06-02 DIAGNOSIS — I5181 Takotsubo syndrome: Secondary | ICD-10-CM | POA: Diagnosis not present

## 2016-06-02 DIAGNOSIS — Z7901 Long term (current) use of anticoagulants: Secondary | ICD-10-CM | POA: Diagnosis not present

## 2016-06-02 DIAGNOSIS — I252 Old myocardial infarction: Secondary | ICD-10-CM | POA: Diagnosis not present

## 2016-06-02 DIAGNOSIS — Z7189 Other specified counseling: Secondary | ICD-10-CM | POA: Diagnosis not present

## 2016-06-04 ENCOUNTER — Ambulatory Visit: Payer: Medicare Other | Admitting: Physical Therapy

## 2016-06-08 DIAGNOSIS — J301 Allergic rhinitis due to pollen: Secondary | ICD-10-CM | POA: Diagnosis not present

## 2016-06-08 DIAGNOSIS — J3089 Other allergic rhinitis: Secondary | ICD-10-CM | POA: Diagnosis not present

## 2016-06-09 ENCOUNTER — Ambulatory Visit: Payer: Medicare Other | Attending: Internal Medicine | Admitting: Physical Therapy

## 2016-06-09 ENCOUNTER — Encounter: Payer: Self-pay | Admitting: Physical Therapy

## 2016-06-09 DIAGNOSIS — R2689 Other abnormalities of gait and mobility: Secondary | ICD-10-CM | POA: Diagnosis not present

## 2016-06-09 DIAGNOSIS — R2681 Unsteadiness on feet: Secondary | ICD-10-CM | POA: Diagnosis not present

## 2016-06-09 DIAGNOSIS — R293 Abnormal posture: Secondary | ICD-10-CM | POA: Diagnosis not present

## 2016-06-09 DIAGNOSIS — M6281 Muscle weakness (generalized): Secondary | ICD-10-CM | POA: Insufficient documentation

## 2016-06-09 NOTE — Therapy (Signed)
[facility] [address] [place], [place], [address] Phone: [phone]   Fax:  [phone]  Physical Therapy Treatment  Patient Details  Name: [name] MRN: [id number] Date of Birth: 1925-02-26 Referring Provider: [name], MD  Encounter Date: 06/09/2016      PT End of Session - 06/09/16 2230    Visit Number 4   Number of Visits 17   Date for PT Re-Evaluation 07/09/16   Authorization Type Medicare G-Code & progress noted every 10 visits   PT Start Time 1016   PT Stop Time 1100   PT Time Calculation (min) 44 min   Equipment Utilized During Treatment Gait belt   Activity Tolerance Patient tolerated treatment well   Behavior During Therapy [facility] for tasks assessed/performed      Past Medical History:  Diagnosis Date  . Adenocarcinoma of right lung, stage 1 ([place]) 12/11/2014   LLL/pt on 04/26/2016  . Asthma   . Atrial fibrillation ([place])   . Basal cell carcinoma    "mostly face" (04/26/2016)  . Bowel obstruction   . Bradycardia   . COPD (chronic obstructive pulmonary disease) (HCC)    mild  . Diverticulosis of colon   . DVT (deep venous thrombosis) (HCC)    LLE  . Hemorrhoids   . Hx of adenomatous colonic polyps 02/1997  . Hyperlipidemia   . Hypertension   . 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[place])    "an allergic pneumonia"  . NSTEMI (non-ST elevated myocardial infarction) ([place]) 04/25/2016  . Osteoarthritis    "back, extremities, left knee, left hip" (04/26/2016)  . Palpitations   . Pleural effusion 11/2014   "after needle ablation for lung cancer"  . Pneumonia 1-2 times  . Squamous carcinoma    "mostly face" (04/26/2016)  . Upper respiratory symptom 03/25/2016    Past Surgical History:  Procedure Laterality Date  . APPENDECTOMY    . BACK SURGERY    . CARDIAC CATHETERIZATION  04/26/2016  . CARDIAC CATHETERIZATION N/A 04/26/2016   Procedure: Coronary/Graft Angiography;  Surgeon: Lorretta Harp, MD;   Location: [facility];  Service: Cardiovascular;  Laterality: N/A;  . CATARACT EXTRACTION W/ INTRAOCULAR LENS IMPLANT Right   . COLECTOMY     "large colon for diverticulitis"  . CYSTOSCOPY     "I was having prostate/bladder trouble"  . INGUINAL HERNIA REPAIR Bilateral   . JOINT REPLACEMENT    . LUMBAR LAMINECTOMY Right     L4-L5   . SHOULDER OPEN ROTATOR CUFF REPAIR Right   . TONSILLECTOMY    . TOTAL HIP ARTHROPLASTY Right   . UMBILICAL HERNIA REPAIR    . VIDEO BRONCHOSCOPY WITH ENDOBRONCHIAL NAVIGATION N/A 12/18/2014   Procedure: VIDEO BRONCHOSCOPY WITH ENDOBRONCHIAL NAVIGATION ;  Surgeon: Collene Gobble, MD;  Location: [facility];  Service: Thoracic;  Laterality: N/A;    There were no vitals filed for this visit.      Subjective Assessment - 06/09/16 1019    Subjective He fell in middle of night hitting his head and knee.    Patient is accompained by: --  paid caregiver   Pertinent History Left shoulder dislocation & RCT without repair, hx of old right (dominant) RCT with repair with limited use, Fall 10/13/2015 with right elbow injury ?bursitis vs hematoma, HTN, A-Fib, COPD, asthma, OA, R TKR, L4-5 laminectomy, Adenocarcinoma LLL,    Limitations Lifting;Standing;Walking;House hold activities   Patient Stated Goals walk without a walker if able, improve balance & walking  Currently in Pain? No/denies                              Balance Exercises - 06/09/16 1015      OTAGO PROGRAM   Head Movements Sitting  upright posture    Neck Movements Sitting  tactile cues posteriorly for cervical retraction   Back Extension Standing  RW support with bed behind for security   Trunk Movements Standing  RW support with bed behind for security   Ankle Movements Sitting   Knee Extensor Weight (comment);10 reps  3# alternating LEs   Knee Flexor 10 reps;Weight (comment)  3#, RW support with bed behind for security   Hip ABductor 10 reps;Weight (comment)  3#,  RW support with bed behind for security   Ankle Plantorflexors 20 reps, support  RW support with bed behind for security   Ankle Dorsiflexors 20 reps, support  RW support with bed behind for security   Knee Bends 10 reps, support  RW support with bed behind for security           PT Education - 06/09/16 1015    Education provided Yes   Education Details decrease fall risk at night: paid caregiver sleeps upstairs with monitor on, PT recommended use of urinal at edge of bed, if feels need for bowel movement call out prior to sitting up to alert caregiver, if light headed when sits up also call out for caregiver's assist.    Person(s) Educated Patient;Caregiver(s)   Methods Explanation;Verbal cues   Comprehension Verbalized understanding;Verbal cues required          PT Short Term Goals - 06/09/16 2230      PT SHORT TERM GOAL #1   Title Patient demonstrates understanding of initial HEP of OTAGO. (Target Date: 06/11/2016)   Baseline progressing 06/09/2016 Pt has basic understanding of OTAGO but needs further instruction.   Time 4   Period Weeks   Status Partially Met     PT SHORT TERM GOAL #2   Title Cognitive Timed Up & Go with RW <20sec with no errors. (Target Date: 06/11/2016)   Time 4   Period Weeks   Status On-going     PT SHORT TERM GOAL #3   Title Patient ambulates 350' with standard RW or rollator walker with supervision.    Baseline MET 06/09/2016   Time 4   Period Weeks   Status Achieved     PT SHORT TERM GOAL #4   Title Patient negotiates ramps & curbs with RW or rollator walker with supervision. (Target Date: 06/11/2016)   Time 4   Period Weeks   Status On-going           PT Long Term Goals - 06/09/16 2232      PT LONG TERM GOAL #1   Title Patient verbalizes and demonstrates understanding of ongoing HEP & fitness plan. (Target Date: 07/09/2016)   Time 8   Period Weeks   Status On-going     PT LONG TERM GOAL #2   Title Patient ambulates 500' with RW  or rollator walker including outdoor surfaces modified independent for community mobility. (Target Date: 07/09/2016)   Time 8   Period Weeks   Status On-going     PT LONG TERM GOAL #3   Title Oceanographer >36/56 to indicate lower fall risk. (Target Date: 07/09/2016)   Time 8   Period Weeks   Status On-going  PT LONG TERM GOAL #4   Title Patient negotiates ramps & curbs with RW or rollator walker modified independent for community access. (Target Date: 07/09/2016)   Time 8   Period Weeks   Status On-going     PT LONG TERM GOAL #5   Title Cognitive Timed Up & Go with Rolling Walker increases <50% from standard TUG. (Target Date: 07/09/2016)   Time 8   Period Weeks   Status On-going               Plan - 06/09/16 2233    Clinical Impression Statement Patient verbalizes understanding of recommendations to reduce fall risk at night. Patient met or partially met 2 of 4 STGS but limited ability to progress with limited attendance in PT.    Rehab Potential Good   Clinical Impairments Affecting Rehab Potential Left shoulder dislocation & RCT without repair, hx of old right (dominant) RCT with repair with limited use, Fall 10/13/2015 with right elbow injury ?bursitis vs hematoma, HTN, A-Fib, COPD, asthma, OA, R TKR, L4-5 laminectomy, Adenocarcinoma LLL,    PT Frequency 2x / week   PT Duration 8 weeks   PT Treatment/Interventions ADLs/Self Care Home Management;DME Instruction;Gait training;Stair training;Functional mobility training;Therapeutic activities;Therapeutic exercise;Balance training;Neuromuscular re-education;Patient/family education   PT Next Visit Plan check remaining 2 STGs, Further review OTAGO. continue with rollator with gait, continues to work on balance and strengthening   Consulted and Agree with Plan of Care Patient      Patient will benefit from skilled therapeutic intervention in order to improve the following deficits and impairments:  Decreased mobility, Abnormal  gait, Decreased activity tolerance, Decreased balance, Decreased endurance, Decreased knowledge of use of DME, Decreased knowledge of precautions, Impaired flexibility, Decreased strength  Visit Diagnosis: Other abnormalities of gait and mobility  Muscle weakness (generalized)  Abnormal posture  Unsteadiness on feet     Problem List Patient Active Problem List   Diagnosis Date Noted  . Stress-induced cardiomyopathy   . NSTEMI (non-ST elevated myocardial infarction) ([place]) 04/25/2016  . Upper respiratory symptom 03/25/2016  . Left arm swelling 03/25/2016  . Troponin I above reference range 03/10/2016  . Shoulder dislocation, left, initial encounter 03/10/2016  . 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 Right elbow pain 12/02/2015  . Cellulitis 10/15/2015  . Pleural effusion 01/23/2015  . Acute respiratory failure with hypoxia ([place]) 01/07/2015  . Permanent atrial fibrillation ([place]) 01/07/2015  . Acute respiratory failure ([place]) 01/07/2015  . Shortness of breath 01/06/2015  . Patellar tendinitis of right knee 12/12/2014  . Adenocarcinoma of right lung, stage 1 ([place]) 12/11/2014  . Traumatic rotator cuff tear 11/07/2014  . Compression fracture of L1 lumbar vertebra (HCC) 11/07/2014  . Lumbar degenerative disc disease 09/11/2014  . Osteoarthritis of right knee 09/11/2014  . Chronic anticoagulation 11/22/2012  . Dyslipidemia 08/06/2008  . COLONIC POLYPS, ADENOMATOUS 07/17/2008  . Essential hypertension 07/17/2008  . BRADYCARDIA 07/17/2008  . HEMORRHOIDS 07/17/2008  . Asthma 07/17/2008  . DIVERTICULOSIS, COLON 07/17/2008  . PALPITATIONS 07/17/2008    Jamey Reas PT, DPT 06/09/2016, 10:41 PM  [facility] [address] [place], [place], [address] Phone: [phone]   Fax:  [phone]  Name: KOWEN KLUTH MRN: [id number] Date of Birth: Jan 19, 1925

## 2016-06-11 ENCOUNTER — Ambulatory Visit: Payer: Medicare Other | Admitting: Physical Therapy

## 2016-06-11 ENCOUNTER — Encounter: Payer: Self-pay | Admitting: Physical Therapy

## 2016-06-11 DIAGNOSIS — R293 Abnormal posture: Secondary | ICD-10-CM | POA: Diagnosis not present

## 2016-06-11 DIAGNOSIS — M6281 Muscle weakness (generalized): Secondary | ICD-10-CM

## 2016-06-11 DIAGNOSIS — R2681 Unsteadiness on feet: Secondary | ICD-10-CM | POA: Diagnosis not present

## 2016-06-11 DIAGNOSIS — R2689 Other abnormalities of gait and mobility: Secondary | ICD-10-CM

## 2016-06-12 NOTE — Therapy (Signed)
[facility] [address] [place], [place], [address] Phone: [phone]   Fax:  [phone]  Physical Therapy Treatment  Patient Details  Name: [name] MRN: [id number] Date of Birth: 1925/03/28 Referring Provider: [name], MD  Encounter Date: 06/11/2016      PT End of Session - 06/11/16 1028    Visit Number 5   Number of Visits 17   Date for PT Re-Evaluation 07/09/16   Authorization Type Medicare G-Code & progress noted every 10 visits   PT Start Time 1018   PT Stop Time 1100   PT Time Calculation (min) 42 min   Equipment Utilized During Treatment Gait belt   Activity Tolerance Patient tolerated treatment well   Behavior During Therapy WFL for tasks assessed/performed      Past Medical History:  Diagnosis Date  . Adenocarcinoma of right lung, stage 1 ([place]) 12/11/2014   LLL/pt on 04/26/2016  . Asthma   . Atrial fibrillation ([place])   . Basal cell carcinoma    "mostly face" (04/26/2016)  . Bowel obstruction   . Bradycardia   . COPD (chronic obstructive pulmonary disease) (HCC)    mild  . Diverticulosis of colon   . DVT (deep venous thrombosis) (HCC)    LLE  . Hemorrhoids   . Hx of adenomatous colonic polyps 02/1997  . Hyperlipidemia   . Hypertension   . 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 NSTEMI (non-ST elevated myocardial infarction) ([place]) 04/25/2016  . Osteoarthritis    "back, extremities, left knee, left hip" (04/26/2016)  . Palpitations   . Pleural effusion 11/2014   "after needle ablation for lung cancer"  . Pneumonia 1-2 times  . Squamous carcinoma    "mostly face" (04/26/2016)  . Upper respiratory symptom 03/25/2016    Past Surgical History:  Procedure Laterality Date  . APPENDECTOMY    . BACK SURGERY    . CARDIAC CATHETERIZATION  04/26/2016  . CARDIAC CATHETERIZATION N/A 04/26/2016   Procedure: Coronary/Graft Angiography;  Surgeon: Lorretta Harp, MD;   Location: [facility];  Service: Cardiovascular;  Laterality: N/A;  . CATARACT EXTRACTION W/ INTRAOCULAR LENS IMPLANT Right   . COLECTOMY     "large colon for diverticulitis"  . CYSTOSCOPY     "I was having prostate/bladder trouble"  . INGUINAL HERNIA REPAIR Bilateral   . JOINT REPLACEMENT    . LUMBAR LAMINECTOMY Right     L4-L5   . SHOULDER OPEN ROTATOR CUFF REPAIR Right   . TONSILLECTOMY    . TOTAL HIP ARTHROPLASTY Right   . UMBILICAL HERNIA REPAIR    . VIDEO BRONCHOSCOPY WITH ENDOBRONCHIAL NAVIGATION N/A 12/18/2014   Procedure: VIDEO BRONCHOSCOPY WITH ENDOBRONCHIAL NAVIGATION ;  Surgeon: Collene Gobble, MD;  Location: [facility];  Service: Thoracic;  Laterality: N/A;    There were no vitals filed for this visit.      Subjective Assessment - 06/11/16 1025    Subjective No new complaints. No new falls or pain to report. Has had some right knee pain, not currently. Left knee hurts at times too where he had sutures placed on wound. Periwound is red and wam to touch. Advised him to follow up with Md. Stated he was told to do this at ED, however has chosen not to do so.  Patient is accompained by: --  paid caregiver   Pertinent History Left shoulder dislocation & RCT without repair, hx of old right (dominant) RCT with repair with limited use, Fall 10/13/2015 with right elbow injury ?bursitis vs hematoma, HTN, A-Fib, COPD, asthma, OA, R TKR, L4-5 laminectomy, Adenocarcinoma LLL,    Limitations Lifting;Standing;Walking;House hold activities   Patient Stated Goals walk without a walker if able, improve balance & walking   Currently in Pain? No/denies   Pain Score 0-No pain            [facility] PT Assessment - 06/11/16 1029      Transfers   Transfers Sit to Stand;Stand to Sit   Sit to Stand 5: Supervision;With upper extremity assist;From chair/3-in-1;From bed   Stand to Sit 5: Supervision;With upper extremity assist;To bed;To chair/3-in-1      Ambulation/Gait   Ambulation/Gait Yes   Ambulation/Gait Assistance 5: Supervision   Ambulation Distance (Feet) 220 Feet   Assistive device Rolling walker   Gait Pattern Step-through pattern;Lateral hip instability;Trunk flexed   Ambulation Surface Level;Indoor   Ramp 5: Supervision   Ramp Details (indicate cue type and reason) with RW, cues to slow down for safety   Curb 5: Supervision   Curb Details (indicate cue type and reason) cues to slow down for safety with RW     Timed Up and Go Test   Normal TUG (seconds) 13.06  supervision with improved walker use   Cognitive TUG (seconds) 25.22  naming animals a-z with RW with supervision/min guard assist            [facility] Adult PT Treatment/Exercise - 06/11/16 1029      High Level Balance   High Level Balance Activities Side stepping;Marching forwards;Marching backwards;Tandem walking;Head turns  tandem/toe walk fwd/bwd;head up/down/laterally   High Level Balance Comments on floor at counter top with single/both UE support: 3 laps each with cues on posture, ex form and technique           PT Short Term Goals - 06/11/16 1714      PT SHORT TERM GOAL #1   Title Patient demonstrates understanding of initial HEP of OTAGO. (Target Date: 06/11/2016)   Baseline progressing 06/09/2016 Pt has basic understanding of OTAGO but needs further instruction.   Status Partially Met     PT SHORT TERM GOAL #2   Title Cognitive Timed Up & Go with RW <20sec with no errors. (Target Date: 06/11/2016)   Baseline 06/11/16: 25.22 sec's with no errors with RW   Status Partially Met     PT SHORT TERM GOAL #3   Title Patient ambulates 350' with standard RW or rollator walker with supervision.    Baseline MET 06/09/2016   Status Achieved     PT SHORT TERM GOAL #4   Title Patient negotiates ramps & curbs with RW or rollator walker with supervision. (Target Date: 06/11/2016)   Baseline 06/11/16: met today.    Status Achieved           PT Long Term  Goals - 06/09/16 2232      PT LONG TERM GOAL #1   Title Patient verbalizes and demonstrates understanding of ongoing HEP & fitness plan. (Target Date: 07/09/2016)   Time 8   Period Weeks   Status On-going     PT LONG TERM GOAL #2   Title Patient ambulates 500' with RW or rollator walker including outdoor surfaces modified independent for community mobility. (Target Date: 07/09/2016)   Time 8  Period Weeks   Status On-going     PT LONG TERM GOAL #3   Title Berg Balance >36/56 to indicate lower fall risk. (Target Date: 07/09/2016)   Time 8   Period Weeks   Status On-going     PT LONG TERM GOAL #4   Title Patient negotiates ramps & curbs with RW or rollator walker modified independent for community access. (Target Date: 07/09/2016)   Time 8   Period Weeks   Status On-going     PT LONG TERM GOAL #5   Title Cognitive Timed Up & Go with Rolling Walker increases <50% from standard TUG. (Target Date: 07/09/2016)   Time 8   Period Weeks   Status On-going               Plan - 06/11/16 1028    Clinical Impression Statement Pt met 1/2 remaining STGs and partially met the other one. Remainder of session focused on high level balance activites with UE support. Pt is making steady progress toward goals and should benefit from continued PT to progress toward unmet goals.    Rehab Potential Good   Clinical Impairments Affecting Rehab Potential Left shoulder dislocation & RCT without repair, hx of old right (dominant) RCT with repair with limited use, Fall 10/13/2015 with right elbow injury ?bursitis vs hematoma, HTN, A-Fib, COPD, asthma, OA, R TKR, L4-5 laminectomy, Adenocarcinoma LLL,    PT Frequency 2x / week   PT Duration 8 weeks   PT Treatment/Interventions ADLs/Self Care Home Management;DME Instruction;Gait training;Stair training;Functional mobility training;Therapeutic activities;Therapeutic exercise;Balance training;Neuromuscular re-education;Patient/family education   PT Next Visit  Plan continue with rollator with gait (if pt agreeable, reported on 06/11/16 he did not want one of these, prefers regular RW), continues to work on balance and strengthening   Consulted and Agree with Plan of Care Patient      Patient will benefit from skilled therapeutic intervention in order to improve the following deficits and impairments:  Decreased mobility, Abnormal gait, Decreased activity tolerance, Decreased balance, Decreased endurance, Decreased knowledge of use of DME, Decreased knowledge of precautions, Impaired flexibility, Decreased strength  Visit Diagnosis: Other abnormalities of gait and mobility  Muscle weakness (generalized)  Abnormal posture  Unsteadiness on feet     Problem List Patient Active Problem List   Diagnosis Date Noted  . Stress-induced cardiomyopathy   . NSTEMI (non-ST elevated myocardial infarction) ([place]) 04/25/2016  . Upper respiratory symptom 03/25/2016  . Left arm swelling 03/25/2016  . Troponin I above reference range 03/10/2016  . Shoulder dislocation, left, initial encounter 03/10/2016  . 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 Right elbow pain 12/02/2015  . Cellulitis 10/15/2015  . Pleural effusion 01/23/2015  . Acute respiratory failure with hypoxia ([place]) 01/07/2015  . Permanent atrial fibrillation ([place]) 01/07/2015  . Acute respiratory failure ([place]) 01/07/2015  . Shortness of breath 01/06/2015  . Patellar tendinitis of right knee 12/12/2014  . Adenocarcinoma of right lung, stage 1 ([place]) 12/11/2014  . Traumatic rotator cuff tear 11/07/2014  . Compression fracture of L1 lumbar vertebra (H[facility]) 11/07/2014  . Lumbar degenerative disc disease 09/11/2014  . Osteoarthritis of right knee 09/11/2014  . Chronic anticoagulation 11/22/2012  . Dyslipidemia 08/06/2008  . COLONIC POLYPS, ADENOMATOUS 07/17/2008  . Essential hypertension 07/17/2008  . BRADYCARDIA 07/17/2008  . HEMORRHOIDS  07/17/2008  . Asthma 07/17/2008  . DIVERTICULOSIS, COLON 07/17/2008  . PALPITATIONS 07/17/2008    Willow Ora, PTA, CLT [facility] [address], [place], [place] [address] [phone] 06/12/16, 5:18 PM   Name: LEMAR BAKOS MRN: [id number] Date of Birth: 08-16-1924

## 2016-06-14 ENCOUNTER — Telehealth: Payer: Self-pay | Admitting: Internal Medicine

## 2016-06-14 ENCOUNTER — Ambulatory Visit (INDEPENDENT_AMBULATORY_CARE_PROVIDER_SITE_OTHER): Payer: Medicare Other | Admitting: Gastroenterology

## 2016-06-14 ENCOUNTER — Encounter: Payer: Self-pay | Admitting: Gastroenterology

## 2016-06-14 VITALS — BP 102/58 | HR 78 | Resp 18 | Ht 69.0 in | Wt 169.6 lb

## 2016-06-14 DIAGNOSIS — D509 Iron deficiency anemia, unspecified: Secondary | ICD-10-CM

## 2016-06-14 DIAGNOSIS — R140 Abdominal distension (gaseous): Secondary | ICD-10-CM | POA: Diagnosis not present

## 2016-06-14 DIAGNOSIS — R6881 Early satiety: Secondary | ICD-10-CM

## 2016-06-14 MED ORDER — OMEPRAZOLE 20 MG PO CPDR: 20.0000 mg | DELAYED_RELEASE_CAPSULE | Freq: Every day | ORAL | 11 refills | Status: DC

## 2016-06-14 NOTE — Telephone Encounter (Signed)
Close encounter 

## 2016-06-14 NOTE — Progress Notes (Signed)
    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 He is accompanied by his daughter. He was evaluated for right lower quadrant abdominal pain and abdominal bloating in December 2017. Abdominal/pelvic CT was recommended however he did not proceed. CBC showed a hemoglobin of 10.6 with iron studies showing a saturation of 12%. LFTs were normal. He was recommended to schedule colonoscopy and EGD. His wife passed away in early 2022/04/12. He was hospitalized in later in 2022/04/12 for SSCP and normal coronaries were noted on cardiac cath. He was evaluated in the ED on February 25 after a fall resulting in a forehead laceration, L knee laceration, facial contusion and hematoma. He relates generalized abdominal bloating and gas with increased flatus. He relates persistent early satiety that he feels is related to bloating. He is taking his Bentyl when necessary and feels it provides some relief.   Current Medications, Allergies, Past Medical History, Past Surgical History, Family History and Social History were reviewed in [employer] record.  Physical Exam: General: Well developed, well nourished, no acute distress, uses a walker Head: Normocephalic and healing laceration on forhead Eyes:  sclerae anicteric, EOMI Ears: Normal auditory acuity Mouth: No deformity or lesions Lungs: Clear throughout to auscultation Heart: Regular rate and rhythm; no murmurs, rubs or bruits Abdomen: Soft, non tender and non distended. No masses, hepatosplenomegaly or hernias noted. Normal Bowel sounds Rectal: no lesions, heme negative soft brown stool Musculoskeletal: Symmetrical with no gross deformities  Pulses:  Normal pulses noted Extremities: No clubbing, cyanosis, edema or deformities noted Neurological: Alert oriented x 4, grossly nonfocal Psychological:  Alert and cooperative. Normal mood and  affect  Assessment and Recommendations:  1. Abdominal bloating, gas, early satiety, iron deficiency anemia with Hemoccult negative stool. Recommended proceeding with abdominal/pelvic CT, colonoscopy and EGD for further evaluation. He agrees to the CT scan but he is very reluctant to proceed with colonoscopy and less reluctant to proceed with upper endoscopy. Begin omeprazole 20 mg qam daily, change Bentyl to 30-60 minutes before meals, not prn and take Gas-X qid prn. The CT scan is unremarkable he will reconsider colonoscopy and EGD. Return visit in 6 weeks.

## 2016-06-14 NOTE — Patient Instructions (Signed)
We have sent the following medications to your pharmacy for you to pick up at your convenience: omeprazole.   Take your Bentyl 30-60 minutes before meals.   You have been scheduled for a CT scan of the abdomen and pelvis at [facility] ([address]---this is in the same building as [facility]).   You are scheduled on 06/22/16 at 2:30pm. You should arrive 15 minutes prior to your appointment time for registration. Please follow the written instructions below on the day of your exam:  WARNING: IF YOU ARE ALLERGIC TO IODINE/X-RAY DYE, PLEASE NOTIFY RADIOLOGY IMMEDIATELY AT [phone]! YOU WILL BE GIVEN A 13 HOUR PREMEDICATION PREP.  1) Do not eat or drink anything after 10:30am (4 hours prior to your test) 2) You have been given 2 bottles of oral contrast to drink. The solution may taste               better if refrigerated, but do NOT add ice or any other liquid to this solution. Shake             well before drinking.    Drink 1 bottle of contrast @ 12:30pm (2 hours prior to your exam)  Drink 1 bottle of contrast @ 1:30pm (1 hour prior to your exam)  You may take any medications as prescribed with a small amount of water except for the following: Metformin, Glucophage, Glucovance, Avandamet, Riomet, Fortamet, Actoplus Met, Janumet, Glumetza or Metaglip. The above medications must be held the day of the exam AND 48 hours after the exam.  The purpose of you drinking the oral contrast is to aid in the visualization of your intestinal tract. The contrast solution may cause some diarrhea. Before your exam is started, you will be given a small amount of fluid to drink. Depending on your individual set of symptoms, you may also receive an intravenous injection of x-ray contrast/dye. Plan on being at [facility] for 30 minutes or longer, depending on the type of exam you are having performed.  This test typically takes 30-45 minutes to complete.  If you have any  questions regarding your exam or if you need to reschedule, you may call the CT department at [phone] between the hours of 8:00 am and 5:00 pm, Monday-Friday.  ________________________________________________________________________  Thank you for choosing me and [facility].  Pricilla Riffle. Dagoberto Ligas., MD., Marval Regal

## 2016-06-15 ENCOUNTER — Telehealth: Payer: Self-pay | Admitting: Physician Assistant

## 2016-06-15 DIAGNOSIS — J301 Allergic rhinitis due to pollen: Secondary | ICD-10-CM | POA: Diagnosis not present

## 2016-06-15 DIAGNOSIS — J3089 Other allergic rhinitis: Secondary | ICD-10-CM | POA: Diagnosis not present

## 2016-06-15 NOTE — Telephone Encounter (Signed)
Pt advised Scott ordered lipid profile/liver profile/BMET for 06/18/16.

## 2016-06-15 NOTE — Telephone Encounter (Signed)
New message   Pt assistant is calling to find out which labs pt is scheduled to have on this Friday, because pt wants to know. She was told yesterday when she called someone would call her back and she did not receive a call.

## 2016-06-16 ENCOUNTER — Encounter: Payer: Self-pay | Admitting: Physical Therapy

## 2016-06-16 ENCOUNTER — Ambulatory Visit: Payer: Medicare Other | Admitting: Physical Therapy

## 2016-06-16 DIAGNOSIS — R2681 Unsteadiness on feet: Secondary | ICD-10-CM

## 2016-06-16 DIAGNOSIS — R293 Abnormal posture: Secondary | ICD-10-CM

## 2016-06-16 DIAGNOSIS — R2689 Other abnormalities of gait and mobility: Secondary | ICD-10-CM | POA: Diagnosis not present

## 2016-06-16 DIAGNOSIS — M6281 Muscle weakness (generalized): Secondary | ICD-10-CM | POA: Diagnosis not present

## 2016-06-16 NOTE — Therapy (Signed)
[facility] [address] [place], [place], [address] Phone: [phone]   Fax:  [phone]  Physical Therapy Treatment  Patient Details  Name: [name] MRN: [id number] Date of Birth: 1924-10-01 Referring Provider: [name], MD  Encounter Date: 06/16/2016      PT End of Session - 06/16/16 2018    Visit Number 6   Number of Visits 17   Date for PT Re-Evaluation 07/09/16   Authorization Type Medicare G-Code & progress noted every 10 visits   PT Start Time 1025   PT Stop Time 1103   PT Time Calculation (min) 38 min   Equipment Utilized During Treatment Gait belt   Activity Tolerance Patient tolerated treatment well   Behavior During Therapy WFL for tasks assessed/performed      Past Medical History:  Diagnosis Date  . Adenocarcinoma of right lung, stage 1 ([place]) 12/11/2014   LLL/pt on 04/26/2016  . Asthma   . Atrial fibrillation ([place])   . Basal cell carcinoma    "mostly face" (04/26/2016)  . Bowel obstruction   . Bradycardia   . COPD (chronic obstructive pulmonary disease) (H[facility])    mild  . Diverticulosis of colon   . DVT (deep venous thrombosis) ([facility])    LLE  . Hemorrhoids   . Hx of adenomatous colonic polyps 02/1997  . Hyperlipidemia   . Hypertension   . 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[place])    "an allergic pneumonia"  . NSTEMI (non-ST elevated myocardial infarction) ([place]) 04/25/2016  . Osteoarthritis    "back, extremities, left knee, left hip" (04/26/2016)  . Palpitations   . Pleural effusion 11/2014   "after needle ablation for lung cancer"  . Pneumonia 1-2 times  . Squamous carcinoma    "mostly face" (04/26/2016)  . Upper respiratory symptom 03/25/2016    Past Surgical History:  Procedure Laterality Date  . APPENDECTOMY    . BACK SURGERY    . CARDIAC CATHETERIZATION  04/26/2016  . CARDIAC CATHETERIZATION N/A 04/26/2016   Procedure: Coronary/Graft Angiography;  Surgeon: Lorretta Harp, MD;   Location: [facility];  Service: Cardiovascular;  Laterality: N/A;  . CATARACT EXTRACTION W/ INTRAOCULAR LENS IMPLANT Right   . COLECTOMY     "large colon for diverticulitis"  . CYSTOSCOPY     "I was having prostate/bladder trouble"  . INGUINAL HERNIA REPAIR Bilateral   . JOINT REPLACEMENT    . LUMBAR LAMINECTOMY Right     L4-L5   . SHOULDER OPEN ROTATOR CUFF REPAIR Right   . TONSILLECTOMY    . TOTAL HIP ARTHROPLASTY Right   . UMBILICAL HERNIA REPAIR    . VIDEO BRONCHOSCOPY WITH ENDOBRONCHIAL NAVIGATION N/A 12/18/2014   Procedure: VIDEO BRONCHOSCOPY WITH ENDOBRONCHIAL NAVIGATION ;  Surgeon: Collene Gobble, MD;  Location: [facility] OR;  Service: Thoracic;  Laterality: N/A;    There were no vitals filed for this visit.      Subjective Assessment - 06/16/16 1026    Subjective No falls. He has not been doing the exercises still. with questioning why "Lazy"   Patient is accompained by: --  caregiver   Pertinent History Left shoulder dislocation & RCT without repair, hx of old right (dominant) RCT with repair with limited use, Fall 10/13/2015 with right elbow injury ?bursitis vs hematoma, HTN, A-Fib, COPD, asthma, OA, R TKR, L4-5 laminectomy, Adenocarcinoma LLL,    Limitations Lifting;Standing;Walking;House hold activities   Patient Stated Goals walk without a walker if able, improve balance & walking  Currently in Pain? Yes   Pain Score 2    Pain Location Knee   Pain Orientation Right;Left   Pain Descriptors / Indicators Aching;Sore   Pain Type Chronic pain   Pain Onset More than a month ago   Pain Frequency Intermittent   Aggravating Factors  weather, cold, increased activity   Pain Relieving Factors rest, tylenol                              Balance Exercises - 06/16/16 1025      OTAGO PROGRAM   Head Movements Sitting;5 reps  work into [employer] watching or riding in car awhen stopped   Neck Movements Sitting;5 reps  work into [employer] or riding in car  awhen stopped   Back Extension 5 reps;Standing  RW support & chair / support behind for safety   Trunk Movements 5 reps;Standing  RW support & chair / support behind for safety   Ankle Movements 10 reps;Sitting  work into down time   Knee Extensor 10 reps  work into [employer] watching   Knee Flexor 10 reps  RW support & chair / support behind for safety   Hip ABductor 10 reps  RW support & chair / support behind for safety   Ankle Plantorflexors 20 reps, support  RW support & chair / support behind for safety   Ankle Dorsiflexors 20 reps, support  RW support & chair / support behind for safety   Knee Bends 10 reps, support  RW support & chair / support behind for safety   Backwards Walking Support  counter support & assistant    Sideways Walking Assistive device  counter support & assistant   Tandem Stance 10 seconds, support  counter support & releases up to 10 sec with assistant   Tandem Walk Support  counter support & assistant   One Leg Stand 10 seconds, support  modified with forefoot in lower cabinet & assistant   Heel Walking Support  counter support & assistant   Toe Walk Support  counter support & assistant   Heel Toe Walking Backward No support  counter support & assistant   Sit to Stand 5 reps, one support           PT Education - 06/16/16 1100    Education provided Yes   Education Details compliance with HEP to make gains   Person(s) Educated Patient   Methods Explanation;Demonstration;Tactile cues;Verbal cues;Handout   Comprehension Verbalized understanding;Returned demonstration;Verbal cues required;Tactile cues required;Need further instruction          PT Short Term Goals - 06/11/16 1714      PT SHORT TERM GOAL #1   Title Patient demonstrates understanding of initial HEP of [place]. (Target Date: 06/11/2016)   Baseline progressing 06/09/2016 Pt has basic understanding of OTAGO but needs further instruction.   Status Partially Met     PT SHORT TERM GOAL  #2   Title Cognitive Timed Up & Go with RW <20sec with no errors. (Target Date: 06/11/2016)   Baseline 06/11/16: 25.22 sec's with no errors with RW   Status Partially Met     PT SHORT TERM GOAL #3   Title Patient ambulates 350' with standard RW or rollator walker with supervision.    Baseline MET 06/09/2016   Status Achieved     PT SHORT TERM GOAL #4   Title Patient negotiates ramps & curbs with RW or rollator walker with supervision. (Target  Date: 06/11/2016)   Baseline 06/11/16: met today.    Status Achieved           PT Long Term Goals - 06/09/16 2232      PT LONG TERM GOAL #1   Title Patient verbalizes and demonstrates understanding of ongoing HEP & fitness plan. (Target Date: 07/09/2016)   Time 8   Period Weeks   Status On-going     PT LONG TERM GOAL #2   Title Patient ambulates 500' with RW or rollator walker including outdoor surfaces modified independent for community mobility. (Target Date: 07/09/2016)   Time 8   Period Weeks   Status On-going     PT LONG TERM GOAL #3   Title Oceanographer >36/56 to indicate lower fall risk. (Target Date: 07/09/2016)   Time 8   Period Weeks   Status On-going     PT LONG TERM GOAL #4   Title Patient negotiates ramps & curbs with RW or rollator walker modified independent for community access. (Target Date: 07/09/2016)   Time 8   Period Weeks   Status On-going     PT LONG TERM GOAL #5   Title Cognitive Timed Up & Go with Rolling Walker increases <50% from standard TUG. (Target Date: 07/09/2016)   Time 8   Period Weeks   Status On-going               Plan - 06/16/16 2019    Clinical Impression Statement Patient and assistant appear to understand need for compliance with HEP and how to improve safety with assistant.    Rehab Potential Good   Clinical Impairments Affecting Rehab Potential Left shoulder dislocation & RCT without repair, hx of old right (dominant) RCT with repair with limited use, Fall 10/13/2015 with right elbow  injury ?bursitis vs hematoma, HTN, A-Fib, COPD, asthma, OA, R TKR, L4-5 laminectomy, Adenocarcinoma LLL,    PT Frequency 2x / week   PT Duration 8 weeks   PT Treatment/Interventions ADLs/Self Care Home Management;DME Instruction;Gait training;Stair training;Functional mobility training;Therapeutic activities;Therapeutic exercise;Balance training;Neuromuscular re-education;Patient/family education   PT Next Visit Plan continues to work on balance and strengthening   Consulted and Agree with Plan of Care Patient      Patient will benefit from skilled therapeutic intervention in order to improve the following deficits and impairments:  Decreased mobility, Abnormal gait, Decreased activity tolerance, Decreased balance, Decreased endurance, Decreased knowledge of use of DME, Decreased knowledge of precautions, Impaired flexibility, Decreased strength  Visit Diagnosis: Other abnormalities of gait and mobility  Muscle weakness (generalized)  Abnormal posture  Unsteadiness on feet     Problem List Patient Active Problem List   Diagnosis Date Noted  . Stress-induced cardiomyopathy   . NSTEMI (non-ST elevated myocardial infarction) ([place]) 04/25/2016  . Upper respiratory symptom 03/25/2016  . Left arm swelling 03/25/2016  . Troponin I above reference range 03/10/2016  . Shoulder dislocation, left, initial encounter 03/10/2016  . 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 Right elbow pain 12/02/2015  . Cellulitis 10/15/2015  . Pleural effusion 01/23/2015  . Acute respiratory failure with hypoxia ([place]) 01/07/2015  . Permanent atrial fibrillation ([place]) 01/07/2015  . Acute respiratory failure ([place]) 01/07/2015  . Shortness of breath 01/06/2015  . Patellar tendinitis of right knee 12/12/2014  . Adenocarcinoma of right lung, stage 1 ([place]) 12/11/2014  . Traumatic rotator cuff tear 11/07/2014  . Compression fracture of L1 lumbar vertebra (H[facility])  11/07/2014  . Lumbar  degenerative disc disease 09/11/2014  . Osteoarthritis of right knee 09/11/2014  . Chronic anticoagulation 11/22/2012  . Dyslipidemia 08/06/2008  . COLONIC POLYPS, ADENOMATOUS 07/17/2008  . Essential hypertension 07/17/2008  . BRADYCARDIA 07/17/2008  . HEMORRHOIDS 07/17/2008  . Asthma 07/17/2008  . DIVERTICULOSIS, COLON 07/17/2008  . PALPITATIONS 07/17/2008    Jamey Reas PT, DPT 06/16/2016, 8:21 PM  [facility] [address] [place], [place], [address] Phone: [phone]   Fax:  [phone]  Name: [name] MRN: [id number] Date of Birth: 29-Mar-1925

## 2016-06-18 ENCOUNTER — Other Ambulatory Visit: Payer: Medicare Other | Admitting: *Deleted

## 2016-06-18 ENCOUNTER — Ambulatory Visit: Payer: Medicare Other | Admitting: Physical Therapy

## 2016-06-18 ENCOUNTER — Encounter: Payer: Self-pay | Admitting: Physical Therapy

## 2016-06-18 DIAGNOSIS — R293 Abnormal posture: Secondary | ICD-10-CM

## 2016-06-18 DIAGNOSIS — R2689 Other abnormalities of gait and mobility: Secondary | ICD-10-CM | POA: Diagnosis not present

## 2016-06-18 DIAGNOSIS — I4821 Permanent atrial fibrillation: Secondary | ICD-10-CM

## 2016-06-18 DIAGNOSIS — M6281 Muscle weakness (generalized): Secondary | ICD-10-CM

## 2016-06-18 DIAGNOSIS — R2681 Unsteadiness on feet: Secondary | ICD-10-CM | POA: Diagnosis not present

## 2016-06-18 DIAGNOSIS — I10 Essential (primary) hypertension: Secondary | ICD-10-CM | POA: Diagnosis not present

## 2016-06-18 DIAGNOSIS — E785 Hyperlipidemia, unspecified: Secondary | ICD-10-CM | POA: Diagnosis not present

## 2016-06-18 DIAGNOSIS — I482 Chronic atrial fibrillation: Secondary | ICD-10-CM | POA: Diagnosis not present

## 2016-06-18 LAB — LIPID PANEL
CHOLESTEROL TOTAL: 121 mg/dL (ref 100–199)
Chol/HDL Ratio: 1.9 ratio units (ref 0.0–5.0)
HDL: 63 mg/dL (ref >39)
LDL CALC: 50 mg/dL (ref 0–99)
TRIGLYCERIDES: 41 mg/dL (ref 0–149)
VLDL Cholesterol Cal: 8 mg/dL (ref 5–40)

## 2016-06-18 LAB — CBC
HEMOGLOBIN: 11.4 g/dL — AB (ref 13.0–17.7)
Hematocrit: 35.2 % — ABNORMAL LOW (ref 37.5–51.0)
MCH: 30.1 pg (ref 26.6–33.0)
MCHC: 32.4 g/dL (ref 31.5–35.7)
MCV: 93 fL (ref 79–97)
Platelets: 195 10*3/uL (ref 150–379)
RBC: 3.79 x10E6/uL — ABNORMAL LOW (ref 4.14–5.80)
RDW: 14.9 % (ref 12.3–15.4)
WBC: 8.3 10*3/uL (ref 3.4–10.8)

## 2016-06-18 LAB — BASIC METABOLIC PANEL
BUN/Creatinine Ratio: 29 — ABNORMAL HIGH (ref 10–24)
BUN: 27 mg/dL (ref 8–27)
CALCIUM: 9.6 mg/dL (ref 8.6–10.2)
CHLORIDE: 96 mmol/L (ref 96–106)
CO2: 28 mmol/L (ref 18–29)
Creatinine, Ser: 0.93 mg/dL (ref 0.76–1.27)
GFR calc non Af Amer: 73 mL/min/{1.73_m2} (ref >59)
GFR, EST AFRICAN AMERICAN: 84 mL/min/{1.73_m2} (ref >59)
GLUCOSE: 89 mg/dL (ref 65–99)
POTASSIUM: 4.7 mmol/L (ref 3.5–5.2)
Sodium: 137 mmol/L (ref 134–144)

## 2016-06-18 LAB — HEPATIC FUNCTION PANEL
ALT: 23 IU/L (ref 0–44)
AST: 30 IU/L (ref 0–40)
Albumin: 3.6 g/dL (ref 3.5–4.7)
Alkaline Phosphatase: 51 IU/L (ref 39–117)
BILIRUBIN, DIRECT: 0.15 mg/dL (ref 0.00–0.40)
Bilirubin Total: 0.3 mg/dL (ref 0.0–1.2)
Total Protein: 5.8 g/dL — ABNORMAL LOW (ref 6.0–8.5)

## 2016-06-18 NOTE — Therapy (Signed)
[facility] [address] [place], [place], [address] Phone: [phone]   Fax:  [phone]  Physical Therapy Treatment  Patient Details  Name: [name] MRN: [id number] Date of Birth: 28-Sep-1924 Referring Provider: [name], MD  Encounter Date: 06/18/2016      PT End of Session - 06/18/16 1931    Visit Number 7   Number of Visits 17   Date for PT Re-Evaluation 07/09/16   Authorization Type Medicare G-Code & progress noted every 10 visits   PT Start Time 1016   PT Stop Time 1058   PT Time Calculation (min) 42 min   Equipment Utilized During Treatment Gait belt   Activity Tolerance Patient tolerated treatment well   Behavior During Therapy [facility] for tasks assessed/performed      Past Medical History:  Diagnosis Date  . Adenocarcinoma of right lung, stage 1 ([place]) 12/11/2014   LLL/pt on 04/26/2016  . Asthma   . Atrial fibrillation ([place])   . Basal cell carcinoma    "mostly face" (04/26/2016)  . Bowel obstruction   . Bradycardia   . COPD (chronic obstructive pulmonary disease) (HCC)    mild  . Diverticulosis of colon   . DVT (deep venous thrombosis) (H[facility])    LLE  . Hemorrhoids   . Hx of adenomatous colonic polyps 02/1997  . Hyperlipidemia   . Hypertension   . 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[place])    "an allergic pneumonia"  . NSTEMI (non-ST elevated myocardial infarction) ([place]) 04/25/2016  . Osteoarthritis    "back, extremities, left knee, left hip" (04/26/2016)  . Palpitations   . Pleural effusion 11/2014   "after needle ablation for lung cancer"  . Pneumonia 1-2 times  . Squamous carcinoma    "mostly face" (04/26/2016)  . Upper respiratory symptom 03/25/2016    Past Surgical History:  Procedure Laterality Date  . APPENDECTOMY    . BACK SURGERY    . CARDIAC CATHETERIZATION  04/26/2016  . CARDIAC CATHETERIZATION N/A 04/26/2016   Procedure: Coronary/Graft Angiography;  Surgeon: Lorretta Harp, MD;   Location: [facility];  Service: Cardiovascular;  Laterality: N/A;  . CATARACT EXTRACTION W/ INTRAOCULAR LENS IMPLANT Right   . COLECTOMY     "large colon for diverticulitis"  . CYSTOSCOPY     "I was having prostate/bladder trouble"  . INGUINAL HERNIA REPAIR Bilateral   . JOINT REPLACEMENT    . LUMBAR LAMINECTOMY Right     L4-L5   . SHOULDER OPEN ROTATOR CUFF REPAIR Right   . TONSILLECTOMY    . TOTAL HIP ARTHROPLASTY Right   . UMBILICAL HERNIA REPAIR    . VIDEO BRONCHOSCOPY WITH ENDOBRONCHIAL NAVIGATION N/A 12/18/2014   Procedure: VIDEO BRONCHOSCOPY WITH ENDOBRONCHIAL NAVIGATION ;  Surgeon: Collene Gobble, MD;  Location: [facility];  Service: Thoracic;  Laterality: N/A;    There were no vitals filed for this visit.      Subjective Assessment - 06/18/16 1016    Subjective No falls. Feels like he should only do exercises 3 times/week (as he has done in the past for PT). He and caregiver are aware that he has been told to do them 5x/wk.    Patient is accompained by: --  caregiver   Pertinent History Left shoulder dislocation & RCT without repair, hx of old right (dominant) RCT with repair with limited use, Fall 10/13/2015 with right elbow injury ?bursitis vs hematoma, HTN, A-Fib, COPD, asthma, OA, R TKR, L4-5 laminectomy, Adenocarcinoma LLL,  Limitations Lifting;Standing;Walking;House hold activities   Patient Stated Goals walk without a walker if able, improve balance & walking   Currently in Pain? No/denies   Pain Onset More than a month ago                         [facility] Adult PT Treatment/Exercise - 06/18/16 1103      Transfers   Transfers Sit to Stand;Stand to Sit   Sit to Stand 4: Min assist;Without upper extremity assist;4: Min guard;With upper extremity assist;From chair/3-in-1   Sit to Stand Details (indicate cue type and reason) 3 no hands with assist; with Rt hand minguard for safety; reviewed safe use of RW/hand placement with RW and pt return  demonstrated remainder of session   Stand to Sit 4: Min guard;With upper extremity assist;Uncontrolled descent   Number of Reps --  5 reps consecutive;   Transfer Cueing --     Ambulation/Gait   Ambulation/Gait Assistance 5: Supervision   Ambulation/Gait Assistance Details vc for proximity to RW and decr pressure via hands;    Ambulation Distance (Feet) 110 Feet  40 x 2   Assistive device Rolling walker   Gait Pattern Step-through pattern;Lateral hip instability;Trunk flexed   Ambulation Surface Level;Indoor     Posture/Postural Control   Posture/Postural Control Postural limitations   Postural Limitations Forward head   Posture Comments addressed during Best boy Other Self-Care Comments   Other Self-Care Comments  Discussed nature of 2 previous falls and pt reports both occurred when getting back to bed after up to bathroom or to sit in easy chair in his bedroom (does this when he cannot sleep). Educated to use a lamp or overhead light that he can turn on from his bed. He reports a "small lamp" and ? if it is adequate--he insists yes. He reports he has begun using urinal at bedside overnight and if gets up due to can't sleep he ALWAYS uses his RW.     Exercises   Exercises Other Exercises;Knee/Hip   Other Exercises  Discussed pt enjoyed using [facility] pool but last was able to do that ~18 months ago. Unsure he has the stamina to go to pool, get changed, do pool work, get out, showered and dressed. He states he is independent with all ADLs and feels he does not need assistance. Discussed possible aquatic therapy in [place] or [facility] as a "bridge" to returning to [facility] pool.      Knee/Hip Exercises: Aerobic   Nustep L1 x 3 min for warm-up             Balance Exercises - 06/18/16 1911      OTAGO PROGRAM   Head Movements Sitting;5 reps   Neck Movements Sitting;5 reps   Back Extension Standing;5 reps   Trunk Movements Sitting;5 reps   Ankle Movements  Sitting;10 reps   Knee Extensor 10 reps;Weight (comment)  3#   Ankle Plantorflexors 20 reps, support  10 reps   Backwards Walking Support   Sideways Walking Assistive device   Heel Walking Support   Toe Walk Support           PT Education - 06/18/16 1929    Education provided Yes   Education Details Possible aquatics available in [place] and/or [place] (pt previously enjoyed aquatic classes [employer]). He agreed OK for PT to look into the possibilities.   Person(s) Educated Associate Professor)   Methods Explanation  Comprehension Verbalized understanding          PT Short Term Goals - 06/11/16 1714      PT SHORT TERM GOAL #1   Title Patient demonstrates understanding of initial HEP of OTAGO. (Target Date: 06/11/2016)   Baseline progressing 06/09/2016 Pt has basic understanding of OTAGO but needs further instruction.   Status Partially Met     PT SHORT TERM GOAL #2   Title Cognitive Timed Up & Go with RW <20sec with no errors. (Target Date: 06/11/2016)   Baseline 06/11/16: 25.22 sec's with no errors with RW   Status Partially Met     PT SHORT TERM GOAL #3   Title Patient ambulates 350' with standard RW or rollator walker with supervision.    Baseline MET 06/09/2016   Status Achieved     PT SHORT TERM GOAL #4   Title Patient negotiates ramps & curbs with RW or rollator walker with supervision. (Target Date: 06/11/2016)   Baseline 06/11/16: met today.    Status Achieved           PT Long Term Goals - 06/09/16 2232      PT LONG TERM GOAL #1   Title Patient verbalizes and demonstrates understanding of ongoing HEP & fitness plan. (Target Date: 07/09/2016)   Time 8   Period Weeks   Status On-going     PT LONG TERM GOAL #2   Title Patient ambulates 500' with RW or rollator walker including outdoor surfaces modified independent for community mobility. (Target Date: 07/09/2016)   Time 8   Period Weeks   Status On-going     PT LONG TERM GOAL #3   Title Oceanographer >36/56  to indicate lower fall risk. (Target Date: 07/09/2016)   Time 8   Period Weeks   Status On-going     PT LONG TERM GOAL #4   Title Patient negotiates ramps & curbs with RW or rollator walker modified independent for community access. (Target Date: 07/09/2016)   Time 8   Period Weeks   Status On-going     PT LONG TERM GOAL #5   Title Cognitive Timed Up & Go with Rolling Walker increases <50% from standard TUG. (Target Date: 07/09/2016)   Time 8   Period Weeks   Status On-going               Plan - 06/18/16 1932    Clinical Impression Statement Focus of session on strengthening and balance to assist with fall risk reduction. Engaged pt in conversation re: activities he enjoyed previously to begin to plan for post-discharge exercise/activity. Patient previously regularly attended aquatic class at [facility] center (stopped ~18 mos ago). Continue to work toward achieving STGs   Rehab Potential Good   Clinical Impairments Affecting Rehab Potential Left shoulder dislocation & RCT without repair, hx of old right (dominant) RCT with repair with limited use, Fall 10/13/2015 with right elbow injury ?bursitis vs hematoma, HTN, A-Fib, COPD, asthma, OA, R TKR, L4-5 laminectomy, Adenocarcinoma LLL,    PT Frequency 2x / week   PT Duration 8 weeks   PT Treatment/Interventions ADLs/Self Care Home Management;DME Instruction;Gait training;Stair training;Functional mobility training;Therapeutic activities;Therapeutic exercise;Balance training;Neuromuscular re-education;Patient/family education   PT Next Visit Plan pt should have had CT abd due to ongoing abd pain. May want to ask about results/check EPIC; continues to work on balance and strengthening; Jeani Hawking to check with [place] re: aquatics possibilities   Consulted and Agree with Plan of Care Patient  Patient will benefit from skilled therapeutic intervention in order to improve the following deficits and impairments:  Decreased mobility, Abnormal  gait, Decreased activity tolerance, Decreased balance, Decreased endurance, Decreased knowledge of use of DME, Decreased knowledge of precautions, Impaired flexibility, Decreased strength  Visit Diagnosis: Muscle weakness (generalized)  Abnormal posture  Unsteadiness on feet     Problem List Patient Active Problem List   Diagnosis Date Noted  . Stress-induced cardiomyopathy   . NSTEMI (non-ST elevated myocardial infarction) ([place]) 04/25/2016  . Upper respiratory symptom 03/25/2016  . Left arm swelling 03/25/2016  . Troponin I above reference range 03/10/2016  . Shoulder dislocation, left, initial encounter 03/10/2016  . 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 Right elbow pain 12/02/2015  . Cellulitis 10/15/2015  . Pleural effusion 01/23/2015  . Acute respiratory failure with hypoxia ([place]) 01/07/2015  . Permanent atrial fibrillation ([place]) 01/07/2015  . Acute respiratory failure ([place]) 01/07/2015  . Shortness of breath 01/06/2015  . Patellar tendinitis of right knee 12/12/2014  . Adenocarcinoma of right lung, stage 1 ([place]) 12/11/2014  . Traumatic rotator cuff tear 11/07/2014  . Compression fracture of L1 lumbar vertebra (HCC) 11/07/2014  . Lumbar degenerative disc disease 09/11/2014  . Osteoarthritis of right knee 09/11/2014  . Chronic anticoagulation 11/22/2012  . Dyslipidemia 08/06/2008  . COLONIC POLYPS, ADENOMATOUS 07/17/2008  . Essential hypertension 07/17/2008  . BRADYCARDIA 07/17/2008  . HEMORRHOIDS 07/17/2008  . Asthma 07/17/2008  . DIVERTICULOSIS, COLON 07/17/2008  . PALPITATIONS 07/17/2008    Rexanne Mano, PT 06/18/2016, 8:05 PM  [facility] [address] [place], [place], [address] Phone: [phone]   Fax:  [phone]  Name: [name] MRN: [id number] Date of Birth: Oct 13, 1924

## 2016-06-21 ENCOUNTER — Telehealth: Payer: Self-pay | Admitting: *Deleted

## 2016-06-21 ENCOUNTER — Ambulatory Visit: Payer: Medicare Other

## 2016-06-21 ENCOUNTER — Other Ambulatory Visit (HOSPITAL_BASED_OUTPATIENT_CLINIC_OR_DEPARTMENT_OTHER): Payer: Medicare Other

## 2016-06-21 DIAGNOSIS — R0989 Other specified symptoms and signs involving the circulatory and respiratory systems: Secondary | ICD-10-CM

## 2016-06-21 DIAGNOSIS — J90 Pleural effusion, not elsewhere classified: Secondary | ICD-10-CM

## 2016-06-21 DIAGNOSIS — Z85118 Personal history of other malignant neoplasm of bronchus and lung: Secondary | ICD-10-CM | POA: Diagnosis present

## 2016-06-21 DIAGNOSIS — C3491 Malignant neoplasm of unspecified part of right bronchus or lung: Secondary | ICD-10-CM

## 2016-06-21 LAB — CBC WITH DIFFERENTIAL/PLATELET
BASO%: 0.4 % (ref 0.0–2.0)
Basophils Absolute: 0 10*3/uL (ref 0.0–0.1)
EOS%: 2.1 % (ref 0.0–7.0)
Eosinophils Absolute: 0.2 10*3/uL (ref 0.0–0.5)
HCT: 36.1 % — ABNORMAL LOW (ref 38.4–49.9)
HGB: 11.6 g/dL — ABNORMAL LOW (ref 13.0–17.1)
LYMPH%: 9.2 % — AB (ref 14.0–49.0)
MCH: 29.8 pg (ref 27.2–33.4)
MCHC: 32.2 g/dL (ref 32.0–36.0)
MCV: 92.3 fL (ref 79.3–98.0)
MONO#: 1 10*3/uL — AB (ref 0.1–0.9)
MONO%: 10.5 % (ref 0.0–14.0)
NEUT%: 77.8 % — ABNORMAL HIGH (ref 39.0–75.0)
NEUTROS ABS: 7.2 10*3/uL — AB (ref 1.5–6.5)
PLATELETS: 170 10*3/uL (ref 140–400)
RBC: 3.91 10*6/uL — AB (ref 4.20–5.82)
RDW: 15.9 % — ABNORMAL HIGH (ref 11.0–14.6)
WBC: 9.3 10*3/uL (ref 4.0–10.3)
lymph#: 0.8 10*3/uL — ABNORMAL LOW (ref 0.9–3.3)

## 2016-06-21 LAB — COMPREHENSIVE METABOLIC PANEL
ALT: 31 U/L (ref 0–55)
AST: 35 U/L — AB (ref 5–34)
Albumin: 3.4 g/dL — ABNORMAL LOW (ref 3.5–5.0)
Alkaline Phosphatase: 54 U/L (ref 40–150)
Anion Gap: 7 mEq/L (ref 3–11)
BUN: 26.7 mg/dL — AB (ref 7.0–26.0)
CO2: 30 meq/L — AB (ref 22–29)
Calcium: 9.9 mg/dL (ref 8.4–10.4)
Chloride: 102 mEq/L (ref 98–109)
Creatinine: 1.2 mg/dL (ref 0.7–1.3)
EGFR: 56 mL/min/{1.73_m2} — ABNORMAL LOW (ref >90)
GLUCOSE: 106 mg/dL (ref 70–140)
Potassium: 4.7 mEq/L (ref 3.5–5.1)
SODIUM: 139 meq/L (ref 136–145)
TOTAL PROTEIN: 6.2 g/dL — AB (ref 6.4–8.3)
Total Bilirubin: 0.49 mg/dL (ref 0.20–1.20)

## 2016-06-21 NOTE — Telephone Encounter (Signed)
-----   Message from Liliane Shi, [place] sent at 06/20/2016 10:10 PM EDT ----- Please call the patient Kidney function is normal. The hemoglobin is stable. Lipids are at goal. LFTs are within acceptable limits. Continue with current treatment plan. Richardson Dopp, PA-C   06/20/2016 10:10 PM

## 2016-06-21 NOTE — Telephone Encounter (Signed)
DPR ok to [facility]. LM lab work ok. Kidney function normal, Hgb stable, Cholesterol ok and LFT stable. No changes to be made with medications. If any questions please call [phone].

## 2016-06-22 ENCOUNTER — Ambulatory Visit (INDEPENDENT_AMBULATORY_CARE_PROVIDER_SITE_OTHER)
Admission: RE | Admit: 2016-06-22 | Discharge: 2016-06-22 | Disposition: A | Discharge status: Home / Self Care | Payer: Medicare Other | Source: Ambulatory Visit | Attending: Gastroenterology | Admitting: Gastroenterology

## 2016-06-22 DIAGNOSIS — R109 Unspecified abdominal pain: Secondary | ICD-10-CM | POA: Diagnosis not present

## 2016-06-22 DIAGNOSIS — R140 Abdominal distension (gaseous): Secondary | ICD-10-CM

## 2016-06-22 DIAGNOSIS — D509 Iron deficiency anemia, unspecified: Secondary | ICD-10-CM

## 2016-06-22 DIAGNOSIS — J301 Allergic rhinitis due to pollen: Secondary | ICD-10-CM | POA: Diagnosis not present

## 2016-06-22 DIAGNOSIS — J3089 Other allergic rhinitis: Secondary | ICD-10-CM | POA: Diagnosis not present

## 2016-06-22 MED ORDER — IOPAMIDOL (ISOVUE-300) INJECTION 61%
100.0000 mL | Freq: Once | INTRAVENOUS | Status: AC | PRN
Start: 2016-06-22 — End: 2016-06-22
  Administered 2016-06-22: 100 mL via INTRAVENOUS

## 2016-06-23 ENCOUNTER — Other Ambulatory Visit: Payer: Self-pay

## 2016-06-23 ENCOUNTER — Ambulatory Visit (HOSPITAL_COMMUNITY)
Admission: RE | Admit: 2016-06-23 | Discharge: 2016-06-23 | Disposition: A | Discharge status: Home / Self Care | Payer: Medicare Other | Source: Ambulatory Visit | Attending: Internal Medicine | Admitting: Internal Medicine

## 2016-06-23 ENCOUNTER — Ambulatory Visit: Payer: Medicare Other | Admitting: Physical Therapy

## 2016-06-23 ENCOUNTER — Ambulatory Visit: Payer: Medicare Other | Admitting: Internal Medicine

## 2016-06-23 DIAGNOSIS — J181 Lobar pneumonia, unspecified organism: Secondary | ICD-10-CM | POA: Diagnosis not present

## 2016-06-23 DIAGNOSIS — C3432 Malignant neoplasm of lower lobe, left bronchus or lung: Secondary | ICD-10-CM | POA: Diagnosis not present

## 2016-06-23 DIAGNOSIS — K8020 Calculus of gallbladder without cholecystitis without obstruction: Secondary | ICD-10-CM | POA: Insufficient documentation

## 2016-06-23 DIAGNOSIS — K769 Liver disease, unspecified: Secondary | ICD-10-CM

## 2016-06-23 DIAGNOSIS — J470 Bronchiectasis with acute lower respiratory infection: Secondary | ICD-10-CM | POA: Insufficient documentation

## 2016-06-23 DIAGNOSIS — R0989 Other specified symptoms and signs involving the circulatory and respiratory systems: Secondary | ICD-10-CM | POA: Insufficient documentation

## 2016-06-23 DIAGNOSIS — I2510 Atherosclerotic heart disease of native coronary artery without angina pectoris: Secondary | ICD-10-CM | POA: Insufficient documentation

## 2016-06-23 DIAGNOSIS — J90 Pleural effusion, not elsewhere classified: Secondary | ICD-10-CM | POA: Diagnosis not present

## 2016-06-23 DIAGNOSIS — C3491 Malignant neoplasm of unspecified part of right bronchus or lung: Secondary | ICD-10-CM | POA: Diagnosis not present

## 2016-06-23 DIAGNOSIS — J439 Emphysema, unspecified: Secondary | ICD-10-CM | POA: Diagnosis not present

## 2016-06-23 MED ORDER — IOPAMIDOL (ISOVUE-300) INJECTION 61%
INTRAVENOUS | Status: AC
  Administered 2016-06-23: 75 mL
  Filled 2016-06-23: qty 75

## 2016-06-24 ENCOUNTER — Telehealth: Payer: Self-pay | Admitting: Internal Medicine

## 2016-06-24 ENCOUNTER — Ambulatory Visit (HOSPITAL_BASED_OUTPATIENT_CLINIC_OR_DEPARTMENT_OTHER): Payer: Medicare Other | Admitting: Internal Medicine

## 2016-06-24 ENCOUNTER — Encounter: Payer: Self-pay | Admitting: Internal Medicine

## 2016-06-24 VITALS — BP 115/64 | HR 70 | Temp 97.8°F | Resp 17 | Wt 170.1 lb

## 2016-06-24 DIAGNOSIS — K769 Liver disease, unspecified: Secondary | ICD-10-CM

## 2016-06-24 DIAGNOSIS — Z85118 Personal history of other malignant neoplasm of bronchus and lung: Secondary | ICD-10-CM | POA: Diagnosis not present

## 2016-06-24 DIAGNOSIS — C3491 Malignant neoplasm of unspecified part of right bronchus or lung: Secondary | ICD-10-CM

## 2016-06-24 NOTE — Progress Notes (Signed)
[facility] Telephone:([phone]   [phone]  OFFICE PROGRESS NOTE  [name], MD [place] [address]  DIAGNOSIS: Stage IB (T2a, N0, M0) non-small cell lung cancer, adenocarcinoma presented with left lower lobe superior segment lung mass diagnosed in September 2016.   PRIOR THERAPY:  Status post thermal ablation at [facility] on 01/03/2015.   CURRENT THERAPY: Observation.  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 The patient is feeling fine today with no specific complaints. He denied having any chest pain, shortness of breath but has mild cough with no hemoptysis. He denied having any fever or chills. He denied having any nausea, vomiting, diarrhea or constipation. He had intermittent abdominal pain and was found on CT scan of the abdomen to have suspicious area in the liver. He is followed by Dr. Fuller Plan who ordered an MRI of the abdomen to be performed next week. The patient denied having any weight loss or night sweats. He has no fever or chills. She had repeat CT scan of the chest performed recently and he is here for evaluation and discussion of his scan results.   MEDICAL HISTORY: Past Medical History:  Diagnosis Date  . Adenocarcinoma of right lung, stage 1 ([place]) 12/11/2014   LLL/pt on 04/26/2016  . Asthma   . Atrial fibrillation ([place])   . Basal cell carcinoma    "mostly face" (04/26/2016)  . Bowel obstruction   . Bradycardia   . COPD (chronic obstructive pulmonary disease) (HCC)    mild  . Diverticulosis of colon   . DVT (deep venous thrombosis) (HCC)    LLE  . Hemorrhoids   . Hx of adenomatous colonic polyps 02/1997  . Hyperlipidemia   . Hypertension   . 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[place])    "an allergic pneumonia"  . NSTEMI (non-ST elevated myocardial infarction) ([place]) 04/25/2016  . Osteoarthritis    "back, extremities, left knee, left hip"  (04/26/2016)  . Palpitations   . Pleural effusion 11/2014   "after needle ablation for lung cancer"  . Pneumonia 1-2 times  . Squamous carcinoma    "mostly face" (04/26/2016)  . Upper respiratory symptom 03/25/2016    ALLERGIES:  is allergic to sulfa antibiotics.  MEDICATIONS:  Current Outpatient Prescriptions  Medication Sig Dispense Refill  . alendronate (FOSAMAX) 70 MG tablet Take 70 mg by mouth once a week. Take with a full glass of water on an empty stomach.    Marland Kitchen apixaban (ELIQUIS) 5 MG TABS tablet Take 1 tablet (5 mg total) by mouth 2 (two) times daily. 60 tablet 6  . bacitracin ointment Apply 1 application topically 2 (two) times daily. 120 g 0  . CALCIUM PO Take 1,000 mg by mouth daily.    . cetirizine (ZYRTEC) 10 MG tablet Take 10 mg by mouth every evening.     . Cholecalciferol (VITAMIN D PO) Take 3 capsules by mouth daily.    Marland Kitchen dicyclomine (BENTYL) 10 MG capsule Take 1 capsule (10 mg total) by mouth 4 (four) times daily -  before meals and at bedtime. 120 capsule 5  . finasteride (PROSCAR) 5 MG tablet Take 5 mg by mouth daily.     . fluticasone (FLONASE) 50 MCG/ACT nasal spray Place 2 sprays into both nostrils every morning.    . Fluticasone-Salmeterol (ADVAIR) 250-50 MCG/DOSE AEPB Inhale 1 puff into the lungs 2 (two) times daily.    Marland Kitchen  metoprolol tartrate (LOPRESSOR) 25 MG tablet Take 1 tablet (25 mg total) by mouth 2 (two) times daily. 60 tablet 6  . montelukast (SINGULAIR) 10 MG tablet Take 10 mg by mouth at bedtime.    Marland Kitchen omeprazole (PRILOSEC) 20 MG capsule Take 1 capsule (20 mg total) by mouth daily. 30 capsule 11  . predniSONE (DELTASONE) 10 MG tablet Take 10 mg by mouth daily.    . Probiotic Product (PROBIOTIC PO) Take 1 capsule by mouth daily.    . rosuvastatin (CRESTOR) 10 MG tablet Take 1 tablet (10 mg total) by mouth daily. 90 tablet 3  . temazepam (RESTORIL) 15 MG capsule Take 15-30 mg by mouth at bedtime.     . Tiotropium Bromide Monohydrate (SPIRIVA RESPIMAT) 2.5  MCG/ACT AERS Inhale 2 puffs into the lungs daily.    Marland Kitchen zolpidem (AMBIEN) 10 MG tablet Take 10 mg by mouth at bedtime as needed for sleep.    Marland Kitchen albuterol (PROVENTIL HFA;VENTOLIN HFA) 108 (90 Base) MCG/ACT inhaler Inhale 1-2 puffs into the lungs every 6 (six) hours as needed for wheezing or shortness of breath.    Marland Kitchen HYDROcodone-acetaminophen (NORCO/VICODIN) 5-325 MG tablet Take 1-2 tablets by mouth every 4 (four) hours as needed for moderate pain. (Patient not taking: Reported on 06/24/2016) 30 tablet 0   No current facility-administered medications for this visit.     SURGICAL HISTORY:  Past Surgical History:  Procedure Laterality Date  . APPENDECTOMY    . BACK SURGERY    . CARDIAC CATHETERIZATION  04/26/2016  . CARDIAC CATHETERIZATION N/A 04/26/2016   Procedure: Coronary/Graft Angiography;  Surgeon: Lorretta Harp, MD;  Location: [facility];  Service: Cardiovascular;  Laterality: N/A;  . CATARACT EXTRACTION W/ INTRAOCULAR LENS IMPLANT Right   . COLECTOMY     "large colon for diverticulitis"  . CYSTOSCOPY     "I was having prostate/bladder trouble"  . INGUINAL HERNIA REPAIR Bilateral   . JOINT REPLACEMENT    . LUMBAR LAMINECTOMY Right     L4-L5   . SHOULDER OPEN ROTATOR CUFF REPAIR Right   . TONSILLECTOMY    . TOTAL HIP ARTHROPLASTY Right   . UMBILICAL HERNIA REPAIR    . VIDEO BRONCHOSCOPY WITH ENDOBRONCHIAL NAVIGATION N/A 12/18/2014   Procedure: VIDEO BRONCHOSCOPY WITH ENDOBRONCHIAL NAVIGATION ;  Surgeon: Collene Gobble, MD;  Location: [facility];  Service: Thoracic;  Laterality: N/A;    REVIEW OF SYSTEMS:  A comprehensive review of systems was negative except for: Respiratory: positive for cough Gastrointestinal: positive for abdominal pain   PHYSICAL EXAMINATION: General appearance: alert, cooperative and no distress Head: Normocephalic, without obvious abnormality, atraumatic Neck: no adenopathy, no JVD, supple, symmetrical, trachea midline and thyroid not enlarged,  symmetric, no tenderness/mass/nodules Lymph nodes: Cervical, supraclavicular, and axillary nodes normal. Resp: clear to auscultation bilaterally Back: symmetric, no curvature. ROM normal. No CVA tenderness. Cardio: regular rate and rhythm, S1, S2 normal, no murmur, click, rub or gallop GI: soft, non-tender; bowel sounds normal; no masses,  no organomegaly Extremities: extremities normal, atraumatic, no cyanosis or edema  ECOG PERFORMANCE STATUS: 1 - Symptomatic but completely ambulatory  Blood pressure 115/64, pulse 70, temperature 97.8 F (36.6 C), temperature source Oral, resp. rate 17, weight 170 lb 1.6 oz (77.2 kg), SpO2 100 %.  LABORATORY DATA: Lab Results  Component Value Date   WBC 9.3 06/21/2016   HGB 11.6 (L) 06/21/2016   HCT 36.1 (L) 06/21/2016   MCV 92.3 06/21/2016   PLT 170 06/21/2016  Chemistry      Component Value Date/Time   NA 139 06/21/2016 1035   K 4.7 06/21/2016 1035   CL 96 06/18/2016 0832   CO2 30 (H) 06/21/2016 1035   BUN 26.7 (H) 06/21/2016 1035   CREATININE 1.2 06/21/2016 1035      Component Value Date/Time   CALCIUM 9.9 06/21/2016 1035   ALKPHOS 54 06/21/2016 1035   AST 35 (H) 06/21/2016 1035   ALT 31 06/21/2016 1035   BILITOT 0.49 06/21/2016 1035       RADIOGRAPHIC STUDIES: Ct Chest W Contrast  Result Date: 06/24/2016 CLINICAL DATA:  Followup left lower lobe squamous cell carcinoma and pleural effusions. EXAM: CT CHEST WITH CONTRAST TECHNIQUE: Multidetector CT imaging of the chest was performed during intravenous contrast administration. CONTRAST:  75 mL ISOVUE-300 IOPAMIDOL (ISOVUE-300) INJECTION 61% COMPARISON:  03/24/2016 FINDINGS: Cardiovascular: No acute findings. Aortic and coronary artery atherosclerosis. Mediastinum/Nodes: No masses or pathologically enlarged lymph nodes identified. Lungs/Pleura: New small right pleural effusion is decreased in size. Previously seen left pleural effusion is resolved since prior exam. There has been  interval resolution of previously seen airspace disease in the peripheral right lower lobe, consistent with resolving infectious or inflammatory process. Stable mild emphysema and varicose bronchiectasis in right lung. Stable bilateral pleural- parenchymal scarring. Wedge-shaped areas of consolidation in the superior and medial basilar segments of the left lower lobe remain stable. No new or enlarging opacities are seen in either lung. Upper Abdomen: Normal adrenal glands. Tiny calcified gallstones again noted. Musculoskeletal: No suspicious bone lesions. Old L1 vertebral body compression fracture deformity again noted. IMPRESSION: Stable wedge-shaped areas of parenchymal consolidation in the left lower lobe. No new or progressive disease within the thorax. Decreased small right pleural effusion. Interval resolution of right lower lobe infiltrate, consistent with resolving infectious or inflammatory process. Stable emphysema and varicose bronchiectasis. Aortic and coronary artery atherosclerosis. Cholelithiasis.  No radiographic evidence of cholecystitis. Electronically Signed   By: Earle Gell M.D.   On: 06/24/2016 08:38   Ct Abdomen Pelvis W Contrast  Result Date: 06/22/2016 CLINICAL DATA:  83 year old male with chronic abdominal pain/ cramping/bloating. Iron deficiency anemia. History of left lower lobe lung cancer treated with cryoablation in 2017. History of colectomy for diverticulitis. Prior hernia repair x3. EXAM: CT ABDOMEN AND PELVIS WITH CONTRAST TECHNIQUE: Multidetector CT imaging of the abdomen and pelvis was performed using the standard protocol following bolus administration of intravenous contrast. CONTRAST:  129m ISOVUE-300 IOPAMIDOL (ISOVUE-300) INJECTION 61% COMPARISON:  10/13/2015 PET-CT.  07/20/2007 CT abdomen/pelvis. FINDINGS: Lower chest: Small dependent right pleural effusion appears decreased since 03/24/2016 chest CT. No residual left pleural effusion at the left lung base. Stable  borderline cardiomegaly. Hepatobiliary: Stable granulomatous liver calcifications. There is a subcapsular 1.2 x 1.2 cm hyperenhancing liver focus in the inferior right liver lobe (series 2/image 28), which is occult on the delayed sequence from today's scan, and not visualized on the noncontrast CT images from the 10/13/2015 PET-CT. No additional liver lesions. Suggestion of tiny gallstones layering in the gallbladder fundus. No gallbladder distention, gallbladder wall thickening or pericholecystic fluid. No biliary ductal dilatation. Pancreas: Normal, with no mass or duct dilation. Spleen: Normal size spleen. Stable granulomatous splenic calcifications. No splenic mass. Adrenals/Urinary Tract: Normal adrenals. No hydronephrosis. Simple bilateral renal cysts, largest an exophytic 5.7 cm renal cyst in the lateral lower right kidney. Additional subcentimeter hypodense renal cortical lesions throughout both kidneys are too small to characterize and require no further follow-up. No gross bladder abnormality, noting  limited bladder visualization due to streak artifact. Stomach/Bowel: Grossly normal stomach. Normal caliber small bowel with no small bowel wall thickening. Appendectomy. Stable postsurgical changes from subtotal left hemicolectomy with intact appearing distal colonic anastomosis in the left lower quadrant. Diffuse colonic diverticulosis in the remnant large bowel, with no large bowel wall thickening or pericolonic fat stranding. Oral contrast progresses to the transverse colon. Vascular/Lymphatic: Atherosclerotic nonaneurysmal abdominal aorta. Patent portal, splenic and renal veins. No pathologically enlarged lymph nodes in the abdomen or pelvis. Reproductive: Limited visualization of the prostate due to streak artifact, with apparently normal prostate size. Other: No pneumoperitoneum, ascites or focal fluid collection. Musculoskeletal: No aggressive appearing focal osseous lesions. Severe thoracolumbar  spondylosis. Stable chronic moderate L1 compression fracture. Stable healed deformity in the lateral right tenth rib. Stable incompletely healed posterior right eleventh rib fracture. IMPRESSION: 1. Indeterminate subcapsular hyperenhancing 1.2 cm focus in the inferior right liver lobe, not visualized on the noncontrast CT images from the 10/13/2015 PET-CT. Although potentially representing a benign transient hepatic perfusional phenomenon, a liver metastasis cannot be excluded given the history of lung cancer. If clinically feasible, MRI abdomen without and with IV contrast is warranted for further characterization. Otherwise, a follow-up triphasic liver protocol CT abdomen without and with IV contrast is recommended in 3 months. 2. No additional potential findings of metastatic disease in the abdomen or pelvis. 3. No acute abnormality. No evidence of bowel obstruction or acute bowel inflammation. Stable postsurgical changes from subtotal left hemicolectomy. Diffuse colonic diverticulosis, with no evidence of acute diverticulitis. 4. Possible cholelithiasis, with no evidence of acute cholecystitis. No biliary ductal dilatation. 5. Aortic atherosclerosis. 6. Small right pleural effusion, decreased. Resolved left pleural effusion. Electronically Signed   By: Ilona Sorrel M.D.   On: 06/22/2016 16:11    ASSESSMENT AND PLAN:  This is a very pleasant 83 years old white male with history of a stage IB non-small cell lung cancer, adenocarcinoma diagnosed in September 2016 is status post several ablation of the tumor at [facility] in October 2016 and has been observation since that time. The patient has repeat CT scan of the chest recently. His scan showed no clear evidence for disease progression. I discussed the scan results with the patient and his daughter who recommended for him to continue on observation with repeat CT scan of the chest in 6 months. For the questionable liver lesion seen on the CT scan of  the abdomen recently, the patient is followed by Dr. Fuller Plan and he has MRI of the abdomen as scheduled next week. He was advised to call immediately if he has any concerning symptoms in the interval. The patient voices understanding of current disease status and treatment options and is in agreement with the current care plan.  All questions were answered. The patient knows to call the clinic with any problems, questions or concerns. We can certainly see the patient much sooner if necessary. I spent 10 minutes counseling the patient face to face. The total time spent in the appointment was 15 minutes.  Disclaimer: This note was dictated with voice recognition software. Similar sounding words can inadvertently be transcribed and may not be corrected upon review.

## 2016-06-24 NOTE — Telephone Encounter (Signed)
Gave patient AVS and calender per 06/24/2016 los. [facility] to contact patient with CT schedule.

## 2016-06-28 ENCOUNTER — Other Ambulatory Visit: Payer: Self-pay | Admitting: Internal Medicine

## 2016-06-28 ENCOUNTER — Ambulatory Visit: Payer: Medicare Other | Admitting: Physical Therapy

## 2016-06-28 DIAGNOSIS — C3412 Malignant neoplasm of upper lobe, left bronchus or lung: Secondary | ICD-10-CM

## 2016-06-29 ENCOUNTER — Other Ambulatory Visit: Payer: Medicare Other

## 2016-06-30 ENCOUNTER — Ambulatory Visit: Payer: Medicare Other | Attending: Internal Medicine | Admitting: Physical Therapy

## 2016-06-30 ENCOUNTER — Encounter: Payer: Self-pay | Admitting: Physical Therapy

## 2016-06-30 DIAGNOSIS — R293 Abnormal posture: Secondary | ICD-10-CM | POA: Diagnosis not present

## 2016-06-30 DIAGNOSIS — M6281 Muscle weakness (generalized): Secondary | ICD-10-CM | POA: Diagnosis not present

## 2016-06-30 DIAGNOSIS — R2681 Unsteadiness on feet: Secondary | ICD-10-CM | POA: Insufficient documentation

## 2016-06-30 DIAGNOSIS — R2689 Other abnormalities of gait and mobility: Secondary | ICD-10-CM | POA: Diagnosis not present

## 2016-06-30 NOTE — Therapy (Signed)
[facility] [address] [place], [place], [address] Phone: [phone]   Fax:  [phone]  Physical Therapy Treatment  Patient Details  Name: [name] MRN: [id number] Date of Birth: Sep 19, 1924 Referring Provider: [name], MD  Encounter Date: 06/30/2016      PT End of Session - 06/30/16 2219    Visit Number 8   Number of Visits 17   Date for PT Re-Evaluation 07/09/16   Authorization Type Medicare G-Code & progress noted every 10 visits   PT Start Time 1017   PT Stop Time 1100   PT Time Calculation (min) 43 min   Equipment Utilized During Treatment Gait belt   Activity Tolerance Patient tolerated treatment well   Behavior During Therapy [facility] for tasks assessed/performed      Past Medical History:  Diagnosis Date  . Adenocarcinoma of right lung, stage 1 ([place]) 12/11/2014   LLL/pt on 04/26/2016  . Asthma   . Atrial fibrillation ([employer])   . Basal cell carcinoma    "mostly face" (04/26/2016)  . Bowel obstruction   . Bradycardia   . COPD (chronic obstructive pulmonary disease) (HCC)    mild  . Diverticulosis of colon   . DVT (deep venous thrombosis) ([facility])    LLE  . Hemorrhoids   . Hx of adenomatous colonic polyps 02/1997  . Hyperlipidemia   . Hypertension   . 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 NSTEMI (non-ST elevated myocardial infarction) ([place]) 04/25/2016  . Osteoarthritis    "back, extremities, left knee, left hip" (04/26/2016)  . Palpitations   . Pleural effusion 11/2014   "after needle ablation for lung cancer"  . Pneumonia 1-2 times  . Squamous carcinoma    "mostly face" (04/26/2016)  . Upper respiratory symptom 03/25/2016    Past Surgical History:  Procedure Laterality Date  . APPENDECTOMY    . BACK SURGERY    . CARDIAC CATHETERIZATION  04/26/2016  . CARDIAC CATHETERIZATION N/A 04/26/2016   Procedure: Coronary/Graft Angiography;  Surgeon: Lorretta Harp, MD;   Location: [facility];  Service: Cardiovascular;  Laterality: N/A;  . CATARACT EXTRACTION W/ INTRAOCULAR LENS IMPLANT Right   . COLECTOMY     "large colon for diverticulitis"  . CYSTOSCOPY     "I was having prostate/bladder trouble"  . INGUINAL HERNIA REPAIR Bilateral   . JOINT REPLACEMENT    . LUMBAR LAMINECTOMY Right     L4-L5   . SHOULDER OPEN ROTATOR CUFF REPAIR Right   . TONSILLECTOMY    . TOTAL HIP ARTHROPLASTY Right   . UMBILICAL HERNIA REPAIR    . VIDEO BRONCHOSCOPY WITH ENDOBRONCHIAL NAVIGATION N/A 12/18/2014   Procedure: VIDEO BRONCHOSCOPY WITH ENDOBRONCHIAL NAVIGATION ;  Surgeon: Collene Gobble, MD;  Location[facility];  Service: Thoracic;  Laterality: N/A;    There were no vitals filed for this visit.      Subjective Assessment - 06/30/16 1024    Subjective He was standing with walker straddling toilet and fell trying to step backward and fell.    Pertinent History Left shoulder dislocation & RCT without repair, hx of old right (dominant) RCT with repair with limited use, Fall 10/13/2015 with right elbow injury ?bursitis vs hematoma, HTN, A-Fib, COPD, asthma, OA, R TKR, L4-5 laminectomy, Adenocarcinoma LLL,    Limitations Lifting;Standing;Walking;House hold activities   Patient Stated Goals walk without a walker if able, improve balance & walking   Currently in Pain? No/denies  Self-Care: PT demo concerns with RW straddling toilet due to limited ability to maneuver as backs away with balance losses. PT demo turning RW posteriorly while standing to toilet for improved safety. Pt return demo understanding with PT verbal cues.   Therapeutic Exercise: See pt education. PT reviewed recommendation to break OTAGO program up during day to improve compliance.   Gait Training: PT instructed pt and caregiver in differences & benefits for rollator walker vs. Standard RW. PT demo, verbal instructed in safe use of rollator walker. Pt ambulated 250' with rollator walker with  verbal cues and turning 180* to sit / stand on rollator walker seat.                             PT Education - 06/30/16 1015    Education provided Yes   Education Details Classes at [facility], Need for compliance with exercise program for maximum benefits,    Person(s) Educated Patient;Caregiver(s)   Methods Explanation   Comprehension Verbalized understanding          PT Short Term Goals - 06/11/16 1714      PT SHORT TERM GOAL #1   Title Patient demonstrates understanding of initial HEP of [place]. (Target Date: 06/11/2016)   Baseline progressing 06/09/2016 Pt has basic understanding of OTAGO but needs further instruction.   Status Partially Met     PT SHORT TERM GOAL #2   Title Cognitive Timed Up & Go with RW <20sec with no errors. (Target Date: 06/11/2016)   Baseline 06/11/16: 25.22 sec's with no errors with RW   Status Partially Met     PT SHORT TERM GOAL #3   Title Patient ambulates 350' with standard RW or rollator walker with supervision.    Baseline MET 06/09/2016   Status Achieved     PT SHORT TERM GOAL #4   Title Patient negotiates ramps & curbs with RW or rollator walker with supervision. (Target Date: 06/11/2016)   Baseline 06/11/16: met today.    Status Achieved           PT Long Term Goals - 06/09/16 2232      PT LONG TERM GOAL #1   Title Patient verbalizes and demonstrates understanding of ongoing HEP & fitness plan. (Target Date: 07/09/2016)   Time 8   Period Weeks   Status On-going     PT LONG TERM GOAL #2   Title Patient ambulates 500' with RW or rollator walker including outdoor surfaces modified independent for community mobility. (Target Date: 07/09/2016)   Time 8   Period Weeks   Status On-going     PT LONG TERM GOAL #3   Title Oceanographer >36/56 to indicate lower fall risk. (Target Date: 07/09/2016)   Time 8   Period Weeks   Status On-going     PT LONG TERM GOAL #4   Title Patient negotiates ramps & curbs with  RW or rollator walker modified independent for community access. (Target Date: 07/09/2016)   Time 8   Period Weeks   Status On-going     PT LONG TERM GOAL #5   Title Cognitive Timed Up & Go with Rolling Walker increases <50% from standard TUG. (Target Date: 07/09/2016)   Time 8   Period Weeks   Status On-going               Plan - 06/30/16 2221    Clinical Impression Statement Patient appears to understand  benefits to rollator walker vs standard RW but uncertain if he wants to purchase one yet.  Patient and caregiver appear to understand modifications for toileting to decrease fall risk. Patient also verbalized understanding of need for compliance with HEP to maximize benefits.    Rehab Potential Good   Clinical Impairments Affecting Rehab Potential Left shoulder dislocation & RCT without repair, hx of old right (dominant) RCT with repair with limited use, Fall 10/13/2015 with right elbow injury ?bursitis vs hematoma, HTN, A-Fib, COPD, asthma, OA, R TKR, L4-5 laminectomy, Adenocarcinoma LLL,    PT Frequency 2x / week   PT Duration 8 weeks   PT Treatment/Interventions ADLs/Self Care Home Management;DME Instruction;Gait training;Stair training;Functional mobility training;Therapeutic activities;Therapeutic exercise;Balance training;Neuromuscular re-education;Patient/family education   PT Next Visit Plan Pt scheduled for MRI to assess liver spot previously noted. Assess LTGs next week with anticipated discharge. Discuss if he wants to pursue training with rollator walker.    Consulted and Agree with Plan of Care Patient      Patient will benefit from skilled therapeutic intervention in order to improve the following deficits and impairments:  Decreased mobility, Abnormal gait, Decreased activity tolerance, Decreased balance, Decreased endurance, Decreased knowledge of use of DME, Decreased knowledge of precautions, Impaired flexibility, Decreased strength  Visit Diagnosis: Muscle  weakness (generalized)  Abnormal posture  Unsteadiness on feet  Other abnormalities of gait and mobility     Problem List Patient Active Problem List   Diagnosis Date Noted  . Stress-induced cardiomyopathy   . NSTEMI (non-ST elevated myocardial infarction) ([place]) 04/25/2016  . Upper respiratory symptom 03/25/2016  . Left arm swelling 03/25/2016  . Troponin I above reference range 03/10/2016  . Shoulder dislocation, left, initial encounter 03/10/2016  . 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 Right elbow pain 12/02/2015  . Cellulitis 10/15/2015  . Pleural effusion 01/23/2015  . Acute respiratory failure with hypoxia ([place]) 01/07/2015  . Permanent atrial fibrillation ([place]) 01/07/2015  . Acute respiratory failure ([place]) 01/07/2015  . Shortness of breath 01/06/2015  . Patellar tendinitis of right knee 12/12/2014  . Adenocarcinoma of right lung, stage 1 ([place]) 12/11/2014  . Traumatic rotator cuff tear 11/07/2014  . Compression fracture of L1 lumbar vertebra (H[facility]) 11/07/2014  . Lumbar degenerative disc disease 09/11/2014  . Osteoarthritis of right knee 09/11/2014  . Chronic anticoagulation 11/22/2012  . Dyslipidemia 08/06/2008  . COLONIC POLYPS, ADENOMATOUS 07/17/2008  . Essential hypertension 07/17/2008  . BRADYCARDIA 07/17/2008  . HEMORRHOIDS 07/17/2008  . Asthma 07/17/2008  . DIVERTICULOSIS, COLON 07/17/2008  . PALPITATIONS 07/17/2008    Jamey Reas PT, DPT 06/30/2016, 10:27 PM  [facility] [address] [place], [place], [address] Phone: [phone]   Fax:  [phone]  Name: [name] MRN: [id number] Date of Birth: Apr 15, 1924

## 2016-07-01 ENCOUNTER — Ambulatory Visit (HOSPITAL_COMMUNITY)
Admission: RE | Admit: 2016-07-01 | Discharge: 2016-07-01 | Disposition: A | Discharge status: Home / Self Care | Payer: Medicare Other | Source: Ambulatory Visit | Attending: Gastroenterology | Admitting: Gastroenterology

## 2016-07-01 DIAGNOSIS — J90 Pleural effusion, not elsewhere classified: Secondary | ICD-10-CM | POA: Insufficient documentation

## 2016-07-01 DIAGNOSIS — K769 Liver disease, unspecified: Secondary | ICD-10-CM | POA: Diagnosis not present

## 2016-07-01 DIAGNOSIS — K8020 Calculus of gallbladder without cholecystitis without obstruction: Secondary | ICD-10-CM | POA: Diagnosis not present

## 2016-07-01 MED ORDER — GADOBENATE DIMEGLUMINE 529 MG/ML IV SOLN
20.0000 mL | Freq: Once | INTRAVENOUS | Status: AC | PRN
  Administered 2016-07-01: 16 mL via INTRAVENOUS

## 2016-07-02 DIAGNOSIS — J3089 Other allergic rhinitis: Secondary | ICD-10-CM | POA: Diagnosis not present

## 2016-07-02 DIAGNOSIS — J301 Allergic rhinitis due to pollen: Secondary | ICD-10-CM | POA: Diagnosis not present

## 2016-07-02 DIAGNOSIS — J4530 Mild persistent asthma, uncomplicated: Secondary | ICD-10-CM | POA: Diagnosis not present

## 2016-07-05 ENCOUNTER — Ambulatory Visit: Payer: Medicare Other | Admitting: Physical Therapy

## 2016-07-05 DIAGNOSIS — R2689 Other abnormalities of gait and mobility: Secondary | ICD-10-CM

## 2016-07-05 DIAGNOSIS — R293 Abnormal posture: Secondary | ICD-10-CM

## 2016-07-05 DIAGNOSIS — R2681 Unsteadiness on feet: Secondary | ICD-10-CM

## 2016-07-05 DIAGNOSIS — M6281 Muscle weakness (generalized): Secondary | ICD-10-CM

## 2016-07-05 NOTE — Therapy (Signed)
[facility] [address] [place], [place], [address] Phone: [phone]   Fax:  [phone]  Physical Therapy Treatment  Patient Details  Name: [name] MRN: [id number] Date of Birth: 1924/04/14 Referring Provider: [name], MD  Encounter Date: 07/05/2016      PT End of Session - 07/05/16 1233    Visit Number 9   Number of Visits 17   Date for PT Re-Evaluation 07/09/16   Authorization Type Medicare G-Code & progress noted every 10 visits   PT Start Time 1015   PT Stop Time 1100   PT Time Calculation (min) 45 min   Equipment Utilized During Treatment Gait belt   Activity Tolerance Patient tolerated treatment well   Behavior During Therapy [facility] for tasks assessed/performed      Past Medical History:  Diagnosis Date  . Adenocarcinoma of right lung, stage 1 ([place]) 12/11/2014   LLL/pt on 04/26/2016  . Asthma   . Atrial fibrillation ([place])   . Basal cell carcinoma    "mostly face" (04/26/2016)  . Bowel obstruction   . Bradycardia   . COPD (chronic obstructive pulmonary disease) (HCC)    mild  . Diverticulosis of colon   . DVT (deep venous thrombosis) (HCC)    LLE  . Hemorrhoids   . Hx of adenomatous colonic polyps 02/1997  . Hyperlipidemia   . Hypertension   . 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[place])    "an allergic pneumonia"  . NSTEMI (non-ST elevated myocardial infarction) ([place]) 04/25/2016  . Osteoarthritis    "back, extremities, left knee, left hip" (04/26/2016)  . Palpitations   . Pleural effusion 11/2014   "after needle ablation for lung cancer"  . Pneumonia 1-2 times  . Squamous carcinoma    "mostly face" (04/26/2016)  . Upper respiratory symptom 03/25/2016    Past Surgical History:  Procedure Laterality Date  . APPENDECTOMY    . BACK SURGERY    . CARDIAC CATHETERIZATION  04/26/2016  . CARDIAC CATHETERIZATION N/A 04/26/2016   Procedure: Coronary/Graft Angiography;  Surgeon: Lorretta Harp, MD;   Location: [facility];  Service: Cardiovascular;  Laterality: N/A;  . CATARACT EXTRACTION W/ INTRAOCULAR LENS IMPLANT Right   . COLECTOMY     "large colon for diverticulitis"  . CYSTOSCOPY     "I was having prostate/bladder trouble"  . INGUINAL HERNIA REPAIR Bilateral   . JOINT REPLACEMENT    . LUMBAR LAMINECTOMY Right     L4-L5   . SHOULDER OPEN ROTATOR CUFF REPAIR Right   . TONSILLECTOMY    . TOTAL HIP ARTHROPLASTY Right   . UMBILICAL HERNIA REPAIR    . VIDEO BRONCHOSCOPY WITH ENDOBRONCHIAL NAVIGATION N/A 12/18/2014   Procedure: VIDEO BRONCHOSCOPY WITH ENDOBRONCHIAL NAVIGATION ;  Surgeon: Collene Gobble, MD;  [facility];  Service: Thoracic;  Laterality: N/A;    There were no vitals filed for this visit.      Subjective Assessment - 07/05/16 1020    Subjective No falls. He forgot to try moving rolling walker position in bathroom. He went to medical supply store and looked at various walkers with his daughter. He decided to stay with current RW.    Pertinent History Left shoulder dislocation & RCT without repair, hx of old right (dominant) RCT with repair with limited use, Fall 10/13/2015 with right elbow injury ?bursitis vs hematoma, HTN, A-Fib, COPD, asthma, OA, R TKR, L4-5 laminectomy, Adenocarcinoma LLL,    Limitations Lifting;Standing;Walking;House hold activities   Patient Stated  Goals walk without a walker if able, improve balance & walking   Currently in Pain? No/denies     Therapeutic Exercise:  See pt education  Gait Training: PT adjusted pt's RW height to facilitate upright posture.  Pt ambulated 500' with RW modified independent.  Pt negotiated ramp & curb with RW modified independent.                             PT Education - 07/05/16 1015    Education provided Yes   Education Details Need for ongoing HEP / fitness including walking his max tolerance >/= 3 days/wk and using shopping carts to work endurance and classes at [employer]) Educated Patient;Caregiver(s)   Methods Explanation   Comprehension Verbalized understanding          PT Short Term Goals - 06/11/16 1714      PT SHORT TERM GOAL #1   Title Patient demonstrates understanding of initial HEP of [place]. (Target Date: 06/11/2016)   Baseline progressing 06/09/2016 Pt has basic understanding of OTAGO but needs further instruction.   Status Partially Met     PT SHORT TERM GOAL #2   Title Cognitive Timed Up & Go with RW <20sec with no errors. (Target Date: 06/11/2016)   Baseline 06/11/16: 25.22 sec's with no errors with RW   Status Partially Met     PT SHORT TERM GOAL #3   Title Patient ambulates 350' with standard RW or rollator walker with supervision.    Baseline MET 06/09/2016   Status Achieved     PT SHORT TERM GOAL #4   Title Patient negotiates ramps & curbs with RW or rollator walker with supervision. (Target Date: 06/11/2016)   Baseline 06/11/16: met today.    Status Achieved           PT Long Term Goals - 07/05/16 1235      PT LONG TERM GOAL #1   Title Patient verbalizes and demonstrates understanding of ongoing HEP & fitness plan. (Target Date: 07/09/2016)   Time 8   Period Weeks   Status On-going     PT LONG TERM GOAL #2   Title Patient ambulates 500' with RW or rollator walker including outdoor surfaces modified independent for community mobility. (Target Date: 07/09/2016)   Baseline MET 07/05/2016   Time 8   Period Weeks   Status Achieved     PT LONG TERM GOAL #3   Title Berg Balance >36/56 to indicate lower fall risk. (Target Date: 07/09/2016)   Time 8   Period Weeks   Status On-going     PT LONG TERM GOAL #4   Title Patient negotiates ramps & curbs with RW or rollator walker modified independent for community access. (Target Date: 07/09/2016)   Baseline MET 07/05/2016   Time 8   Period Weeks   Status Achieved     PT LONG TERM GOAL #5   Title Cognitive Timed Up & Go with Rolling Walker increases <50%  from standard TUG. (Target Date: 07/09/2016)   Time 8   Period Weeks   Status On-going               Plan - 07/05/16 1235    Clinical Impression Statement Patient met 2 of 5 LTGs for community gait with RW. He verbalizes understanding of recommendation for ongoing RW use for safety but again asked if he could use a cane.  Rehab Potential Good   Clinical Impairments Affecting Rehab Potential Left shoulder dislocation & RCT without repair, hx of old right (dominant) RCT with repair with limited use, Fall 10/13/2015 with right elbow injury ?bursitis vs hematoma, HTN, A-Fib, COPD, asthma, OA, R TKR, L4-5 laminectomy, Adenocarcinoma LLL,    PT Frequency 2x / week   PT Duration 8 weeks   PT Treatment/Interventions ADLs/Self Care Home Management;DME Instruction;Gait training;Stair training;Functional mobility training;Therapeutic activities;Therapeutic exercise;Balance training;Neuromuscular re-education;Patient/family education   PT Next Visit Plan assess remaining LTGs, do G-code & discharge.    Consulted and Agree with Plan of Care Patient      Patient will benefit from skilled therapeutic intervention in order to improve the following deficits and impairments:  Decreased mobility, Abnormal gait, Decreased activity tolerance, Decreased balance, Decreased endurance, Decreased knowledge of use of DME, Decreased knowledge of precautions, Impaired flexibility, Decreased strength  Visit Diagnosis: Muscle weakness (generalized)  Abnormal posture  Unsteadiness on feet  Other abnormalities of gait and mobility     Problem List Patient Active Problem List   Diagnosis Date Noted  . Stress-induced cardiomyopathy   . NSTEMI (non-ST elevated myocardial infarction) ([place]) 04/25/2016  . Upper respiratory symptom 03/25/2016  . Left arm swelling 03/25/2016  . Troponin I above reference range 03/10/2016  . Shoulder dislocation, left, initial encounter 03/10/2016  . History of Torsades de  pointes 2/2 anesthesia for shoulder relocations in 2017 03/10/2016  . Right elbow pain 12/02/2015  . Cellulitis 10/15/2015  . Pleural effusion 01/23/2015  . Acute respiratory failure with hypoxia ([place]) 01/07/2015  . Permanent atrial fibrillation ([place]) 01/07/2015  . Acute respiratory failure ([place]) 01/07/2015  . Shortness of breath 01/06/2015  . Patellar tendinitis of right knee 12/12/2014  . Adenocarcinoma of right lung, stage 1 ([place]) 12/11/2014  . Traumatic rotator cuff tear 11/07/2014  . Compression fracture of L1 lumbar vertebra (HCC) 11/07/2014  . Lumbar degenerative disc disease 09/11/2014  . Osteoarthritis of right knee 09/11/2014  . Chronic anticoagulation 11/22/2012  . Dyslipidemia 08/06/2008  . COLONIC POLYPS, ADENOMATOUS 07/17/2008  . Essential hypertension 07/17/2008  . BRADYCARDIA 07/17/2008  . HEMORRHOIDS 07/17/2008  . Asthma 07/17/2008  . DIVERTICULOSIS, COLON 07/17/2008  . PALPITATIONS 07/17/2008    Jamey Reas PT, DPT 07/05/2016, 9:33 PM  [facility] [address] [place], [place], [address] Phone: [phone]   Fax:  [phone]  Name: [name] MRN: [id number] Date of Birth: Aug 07, 1924

## 2016-07-06 DIAGNOSIS — J3089 Other allergic rhinitis: Secondary | ICD-10-CM | POA: Diagnosis not present

## 2016-07-06 DIAGNOSIS — J301 Allergic rhinitis due to pollen: Secondary | ICD-10-CM | POA: Diagnosis not present

## 2016-07-07 ENCOUNTER — Encounter: Payer: Self-pay | Admitting: Physical Therapy

## 2016-07-07 ENCOUNTER — Ambulatory Visit: Payer: Medicare Other | Admitting: Physical Therapy

## 2016-07-07 DIAGNOSIS — R293 Abnormal posture: Secondary | ICD-10-CM

## 2016-07-07 DIAGNOSIS — M6281 Muscle weakness (generalized): Secondary | ICD-10-CM

## 2016-07-07 DIAGNOSIS — R2681 Unsteadiness on feet: Secondary | ICD-10-CM

## 2016-07-07 DIAGNOSIS — R2689 Other abnormalities of gait and mobility: Secondary | ICD-10-CM

## 2016-07-07 NOTE — Therapy (Signed)
[facility] [address] [place], [place], [address] Phone: [phone]   Fax:  [phone]  Physical Therapy Treatment  Patient Details  Name: [name] MRN: [id number] Date of Birth: Aug 15, 1924 Referring Provider: [name], MD  Encounter Date: 07/07/2016      PT End of Session - 07/07/16 2304    Visit Number 10   Number of Visits 17   Date for PT Re-Evaluation 07/09/16   Authorization Type Medicare G-Code & progress noted every 10 visits   PT Start Time 1017   PT Stop Time 1055   PT Time Calculation (min) 38 min   Equipment Utilized During Treatment Gait belt   Activity Tolerance Patient tolerated treatment well   Behavior During Therapy [facility] for tasks assessed/performed      Past Medical History:  Diagnosis Date  . Adenocarcinoma of right lung, stage 1 ([place]) 12/11/2014   LLL/pt on 04/26/2016  . Asthma   . Atrial fibrillation ([place])   . Basal cell carcinoma    "mostly face" (04/26/2016)  . Bowel obstruction   . Bradycardia   . COPD (chronic obstructive pulmonary disease) (HCC)    mild  . Diverticulosis of colon   . DVT (deep venous thrombosis) (HCC)    LLE  . Hemorrhoids   . Hx of adenomatous colonic polyps 02/1997  . Hyperlipidemia   . Hypertension   . 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 NSTEMI (non-ST elevated myocardial infarction) ([place]) 04/25/2016  . Osteoarthritis    "back, extremities, left knee, left hip" (04/26/2016)  . Palpitations   . Pleural effusion 11/2014   "after needle ablation for lung cancer"  . Pneumonia 1-2 times  . Squamous carcinoma    "mostly face" (04/26/2016)  . Upper respiratory symptom 03/25/2016    Past Surgical History:  Procedure Laterality Date  . APPENDECTOMY    . BACK SURGERY    . CARDIAC CATHETERIZATION  04/26/2016  . CARDIAC CATHETERIZATION N/A 04/26/2016   Procedure: Coronary/Graft Angiography;  Surgeon: Lorretta Harp, MD;   Location: [facility];  Service: Cardiovascular;  Laterality: N/A;  . CATARACT EXTRACTION W/ INTRAOCULAR LENS IMPLANT Right   . COLECTOMY     "large colon for diverticulitis"  . CYSTOSCOPY     "I was having prostate/bladder trouble"  . INGUINAL HERNIA REPAIR Bilateral   . JOINT REPLACEMENT    . LUMBAR LAMINECTOMY Right     L4-L5   . SHOULDER OPEN ROTATOR CUFF REPAIR Right   . TONSILLECTOMY    . TOTAL HIP ARTHROPLASTY Right   . UMBILICAL HERNIA REPAIR    . VIDEO BRONCHOSCOPY WITH ENDOBRONCHIAL NAVIGATION N/A 12/18/2014   Procedure: VIDEO BRONCHOSCOPY WITH ENDOBRONCHIAL NAVIGATION ;  Surgeon: Collene Gobble, MD;  [facility];  Service: Thoracic;  Laterality: N/A;    There were no vitals filed for this visit.      Subjective Assessment - 07/07/16 1020    Subjective No falls or issues.    Patient is accompained by: --  caregiver   Pertinent History Left shoulder dislocation & RCT without repair, hx of old right (dominant) RCT with repair with limited use, Fall 10/13/2015 with right elbow injury ?bursitis vs hematoma, HTN, A-Fib, COPD, asthma, OA, R TKR, L4-5 laminectomy, Adenocarcinoma LLL,    Limitations Lifting;Standing;Walking;House hold activities   Patient Stated Goals walk without a walker if able, improve balance & walking   Currently in Pain? No/denies  [facility] PT Assessment - 07/07/16 1015      Observation/Other Assessments   Focus on Therapeutic Outcomes (FOTO)  63.42 Functional Status  Initial FS was 55.45   Activities of Balance Confidence Scale (ABC Scale)  52.5%  initial was 35.6%   Fear Avoidance Belief Questionnaire (FABQ)  36 (11)  Initial was 26 (8)     Berg Balance Test   Sit to Stand Able to stand  independently using hands   Standing Unsupported Able to stand 2 minutes with supervision   Sitting with Back Unsupported but Feet Supported on Floor or Stool Able to sit safely and securely 2 minutes   Stand to Sit Controls descent by  using hands   Transfers Able to transfer safely, definite need of hands   Standing Unsupported with Eyes Closed Able to stand 10 seconds with supervision   Standing Ubsupported with Feet Together Able to place feet together independently and stand for 1 minute with supervision   From Standing, Reach Forward with Outstretched Arm Can reach forward >12 cm safely (5")   From Standing Position, Pick up Object from Floor Unable to pick up and needs supervision   From Standing Position, Turn to Look Behind Over each Shoulder Turn sideways only but maintains balance   Turn 360 Degrees Needs close supervision or verbal cueing   Standing Unsupported, Alternately Place Feet on Step/Stool Able to complete >2 steps/needs minimal assist   Standing Unsupported, One Foot in Front Needs help to step but can hold 15 seconds   Standing on One Leg Tries to lift leg/unable to hold 3 seconds but remains standing independently   Total Score 32   Berg comment: Initial Berg 25/56     Timed Up and Go Test   Normal TUG (seconds) 12.42   Cognitive TUG (seconds) 14.29  15% increase, naming animals A-Z with RW no assist                               PT Short Term Goals - 06/11/16 1714      PT SHORT TERM GOAL #1   Title Patient demonstrates understanding of initial HEP of OTAGO. (Target Date: 06/11/2016)   Baseline progressing 06/09/2016 Pt has basic understanding of OTAGO but needs further instruction.   Status Partially Met     PT SHORT TERM GOAL #2   Title Cognitive Timed Up & Go with RW <20sec with no errors. (Target Date: 06/11/2016)   Baseline 06/11/16: 25.22 sec's with no errors with RW   Status Partially Met     PT SHORT TERM GOAL #3   Title Patient ambulates 350' with standard RW or rollator walker with supervision.    Baseline MET 06/09/2016   Status Achieved     PT SHORT TERM GOAL #4   Title Patient negotiates ramps & curbs with RW or rollator walker with supervision. (Target  Date: 06/11/2016)   Baseline 06/11/16: met today.    Status Achieved           PT Long Term Goals - 07/07/16 2307      PT LONG TERM GOAL #1   Title Patient verbalizes and demonstrates understanding of ongoing HEP & fitness plan. (Target Date: 07/09/2016)   Baseline MET 07/07/2016   Time 8   Period Weeks   Status Achieved     PT LONG TERM GOAL #2   Title Patient ambulates 500' with RW or rollator walker including outdoor  surfaces modified independent for community mobility. (Target Date: 07/09/2016)   Baseline MET 07/05/2016   Time 8   Period Weeks   Status Achieved     PT LONG TERM GOAL #3   Title Berg Balance >36/56 to indicate lower fall risk. (Target Date: 07/09/2016)   Baseline Partially MET increased Berg Balance to 32/56   Time 8   Period Weeks   Status Partially Met     PT LONG TERM GOAL #4   Title Patient negotiates ramps & curbs with RW or rollator walker modified independent for community access. (Target Date: 07/09/2016)   Baseline MET 07/05/2016   Time 8   Period Weeks   Status Achieved     PT LONG TERM GOAL #5   Title Cognitive Timed Up & Go with Rolling Walker increases <50% from standard TUG. (Target Date: 07/09/2016)   Baseline MET 07/22/2016  standard TUG 12.42sec Cognitive TUG 14.29sec (15% increase)   Time 8   Period Weeks   Status Achieved               Plan - 07-22-2016 07-Jun-2307    Clinical Impression Statement Patient met 4 & partially met 1 of 5 LTGs. Patient and caregiver verbalize understanding on recommendation for ongoing HEP / fitness plan.    Rehab Potential Good   Clinical Impairments Affecting Rehab Potential Left shoulder dislocation & RCT without repair, hx of old right (dominant) RCT with repair with limited use, Fall 10/13/2015 with right elbow injury ?bursitis vs hematoma, HTN, A-Fib, COPD, asthma, OA, R TKR, L4-5 laminectomy, Adenocarcinoma LLL,    PT Frequency 2x / week   PT Duration 8 weeks   PT Treatment/Interventions ADLs/Self Care  Home Management;DME Instruction;Gait training;Stair training;Functional mobility training;Therapeutic activities;Therapeutic exercise;Balance training;Neuromuscular re-education;Patient/family education   PT Next Visit Plan discharge.    Consulted and Agree with Plan of Care Patient      Patient will benefit from skilled therapeutic intervention in order to improve the following deficits and impairments:  Decreased mobility, Abnormal gait, Decreased activity tolerance, Decreased balance, Decreased endurance, Decreased knowledge of use of DME, Decreased knowledge of precautions, Impaired flexibility, Decreased strength  Visit Diagnosis: Muscle weakness (generalized)  Abnormal posture  Unsteadiness on feet  Other abnormalities of gait and mobility       G-Codes - 2016-07-22 06-06-2309    Functional Assessment Tool Used (Outpatient Only) TUG 12.42sec and cognitive TUG 14.29sec (15% increase) with no errors.    Functional Limitation Mobility: Walking and moving around   Mobility: Walking and Moving Around Goal Status [phone]) At least 40 percent but less than 60 percent impaired, limited or restricted   Mobility: Walking and Moving Around Discharge Status [phone]) At least 40 percent but less than 60 percent impaired, limited or restricted      Problem List Patient Active Problem List   Diagnosis Date Noted  . Stress-induced cardiomyopathy   . NSTEMI (non-ST elevated myocardial infarction) ([place]) 04/25/2016  . Upper respiratory symptom 03/25/2016  . Left arm swelling 03/25/2016  . Troponin I above reference range 03/10/2016  . Shoulder dislocation, left, initial encounter 03/10/2016  . History of Torsades de pointes 2/2 anesthesia for shoulder relocations in 06/07/2015 03/10/2016  . Right elbow pain 12/02/2015  . Cellulitis 10/15/2015  . Pleural effusion 01/23/2015  . Acute respiratory failure with hypoxia ([place]) 01/07/2015  . Permanent atrial fibrillation ([place]) 01/07/2015  . Acute respiratory  failure ([place]) 01/07/2015  . Shortness of breath 01/06/2015  . Patellar tendinitis of right knee  12/12/2014  . Adenocarcinoma of right lung, stage 1 ([place]) 12/11/2014  . Traumatic rotator cuff tear 11/07/2014  . Compression fracture of L1 lumbar vertebra (HCC) 11/07/2014  . Lumbar degenerative disc disease 09/11/2014  . Osteoarthritis of right knee 09/11/2014  . Chronic anticoagulation 11/22/2012  . Dyslipidemia 08/06/2008  . COLONIC POLYPS, ADENOMATOUS 07/17/2008  . Essential hypertension 07/17/2008  . BRADYCARDIA 07/17/2008  . HEMORRHOIDS 07/17/2008  . Asthma 07/17/2008  . DIVERTICULOSIS, COLON 07/17/2008  . PALPITATIONS 07/17/2008    PHYSICAL THERAPY DISCHARGE SUMMARY  Visits from Start of Care: 10  Current functional level related to goals / functional outcomes: See above   Remaining deficits: See above   Education / Equipment: HEP / ongoing fitness plan  Plan: Patient agrees to discharge.  Patient goals were met. Patient is being discharged due to meeting the stated rehab goals.  ?????         Cassara Nida PT, DPT 07/07/2016, 11:12 PM  [facility] [address] [place], [place], [address] Phone: [phone]   Fax:  [phone]  Name: [name] MRN: [id number] Date of Birth: 05-20-1924

## 2016-07-08 DIAGNOSIS — Z6824 Body mass index (BMI) 24.0-24.9, adult: Secondary | ICD-10-CM | POA: Diagnosis not present

## 2016-07-08 DIAGNOSIS — J449 Chronic obstructive pulmonary disease, unspecified: Secondary | ICD-10-CM | POA: Diagnosis not present

## 2016-07-08 DIAGNOSIS — C3412 Malignant neoplasm of upper lobe, left bronchus or lung: Secondary | ICD-10-CM | POA: Diagnosis not present

## 2016-07-08 DIAGNOSIS — E2740 Unspecified adrenocortical insufficiency: Secondary | ICD-10-CM | POA: Diagnosis not present

## 2016-07-08 DIAGNOSIS — R269 Unspecified abnormalities of gait and mobility: Secondary | ICD-10-CM | POA: Diagnosis not present

## 2016-07-08 DIAGNOSIS — I480 Paroxysmal atrial fibrillation: Secondary | ICD-10-CM | POA: Diagnosis not present

## 2016-07-08 DIAGNOSIS — R140 Abdominal distension (gaseous): Secondary | ICD-10-CM | POA: Diagnosis not present

## 2016-07-08 DIAGNOSIS — R7301 Impaired fasting glucose: Secondary | ICD-10-CM | POA: Diagnosis not present

## 2016-07-08 DIAGNOSIS — Z1389 Encounter for screening for other disorder: Secondary | ICD-10-CM | POA: Diagnosis not present

## 2016-07-13 DIAGNOSIS — J3089 Other allergic rhinitis: Secondary | ICD-10-CM | POA: Diagnosis not present

## 2016-07-13 DIAGNOSIS — J301 Allergic rhinitis due to pollen: Secondary | ICD-10-CM | POA: Diagnosis not present

## 2016-07-20 DIAGNOSIS — J301 Allergic rhinitis due to pollen: Secondary | ICD-10-CM | POA: Diagnosis not present

## 2016-07-20 DIAGNOSIS — J3089 Other allergic rhinitis: Secondary | ICD-10-CM | POA: Diagnosis not present

## 2016-07-27 DIAGNOSIS — J301 Allergic rhinitis due to pollen: Secondary | ICD-10-CM | POA: Diagnosis not present

## 2016-07-27 DIAGNOSIS — J3089 Other allergic rhinitis: Secondary | ICD-10-CM | POA: Diagnosis not present

## 2016-07-28 ENCOUNTER — Encounter: Payer: Self-pay | Admitting: Gastroenterology

## 2016-07-28 ENCOUNTER — Ambulatory Visit (INDEPENDENT_AMBULATORY_CARE_PROVIDER_SITE_OTHER): Payer: Medicare Other | Admitting: Gastroenterology

## 2016-07-28 VITALS — BP 122/80 | HR 56 | Ht 69.0 in | Wt 177.6 lb

## 2016-07-28 DIAGNOSIS — R140 Abdominal distension (gaseous): Secondary | ICD-10-CM

## 2016-07-28 DIAGNOSIS — D509 Iron deficiency anemia, unspecified: Secondary | ICD-10-CM | POA: Diagnosis not present

## 2016-07-28 NOTE — Progress Notes (Signed)
    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 He is accompanied by his daughter. He states he discontinued all lactose products and his abdominal bloating and discomfort has substantially improved. His appetite has improved. Bentyl provides some relief from his abdominal bloating. His bowel movements have been more regular recently. He states he has felt better for the past week that he has felt in quite some time. His 89th birthday is on [birth year]. Reviewed his evaluation to date. He continues to decline recommended colonoscopy and EGD.  abd MRI 07/01/2016 IMPRESSION: 1. The inferior right liver abnormality seen on CT scan of 06/22/2016 represents a tiny 5 mm lesion that flash fills, potentially a cavernous hemangioma. The lesion is likely benign. Follow-up MRI without and with contrast in 6 months could be used to ensure stability, as clinically warranted. 2. Cholelithiasis. 3. Tiny pleural effusions.  Current Medications, Allergies, Past Medical History, Past Surgical History, Family History and Social History were reviewed in [employer] record.  Physical Exam: General: Well developed, well nourished, elderly, with a walker, no acute distress Head: Normocephalic and atraumatic Eyes:  sclerae anicteric, EOMI Ears: Normal auditory acuity mities noted Neurological: Alert oriented x 4, grossly nonfocal Psychological:  Alert and cooperative. Normal mood and affect  Assessment and Recommendations:  1. Iron deficiency anemia with Hemoccult negative stool. Etiology unclear. Continue iron bid with meals and follow-up with PCP.  2. Abdominal bloating, gas and early satiety substantially improved on lactose-free diet. Provided information on a low gas diet. Search for other foods that may be exacerbating symptoms. Continue Bentyl 10 mg ac and hs when necessary. REV  in 6 months.  3. Hepatic hemangioma. No further evaluation.  4. Cholelithiasis. No clear biliary symptoms. No further evaluation at this time.  I spent 15 minutes of face-to-face time with the patient. Greater than 50% of the time was spent counseling and coordinating care.

## 2016-07-28 NOTE — Patient Instructions (Addendum)
You have been given a lactose free diet and low gas diet.   Thank you for choosing me and [facility].  Pricilla Riffle. Dagoberto Ligas., MD., [facility]

## 2016-07-30 IMAGING — DX DG CHEST 2V
2 series · 2 of 2 positions shown · non-contrast
Comparison: PA and lateral chest x-ray January 09, 2015

CLINICAL DATA: Progressive or worsening of shortness of breath over
the past week; the patient underwent lung tumor ablation 3 weeks
ago, history of COPD.

EXAM:
CHEST  2 VIEW

[chest pa]
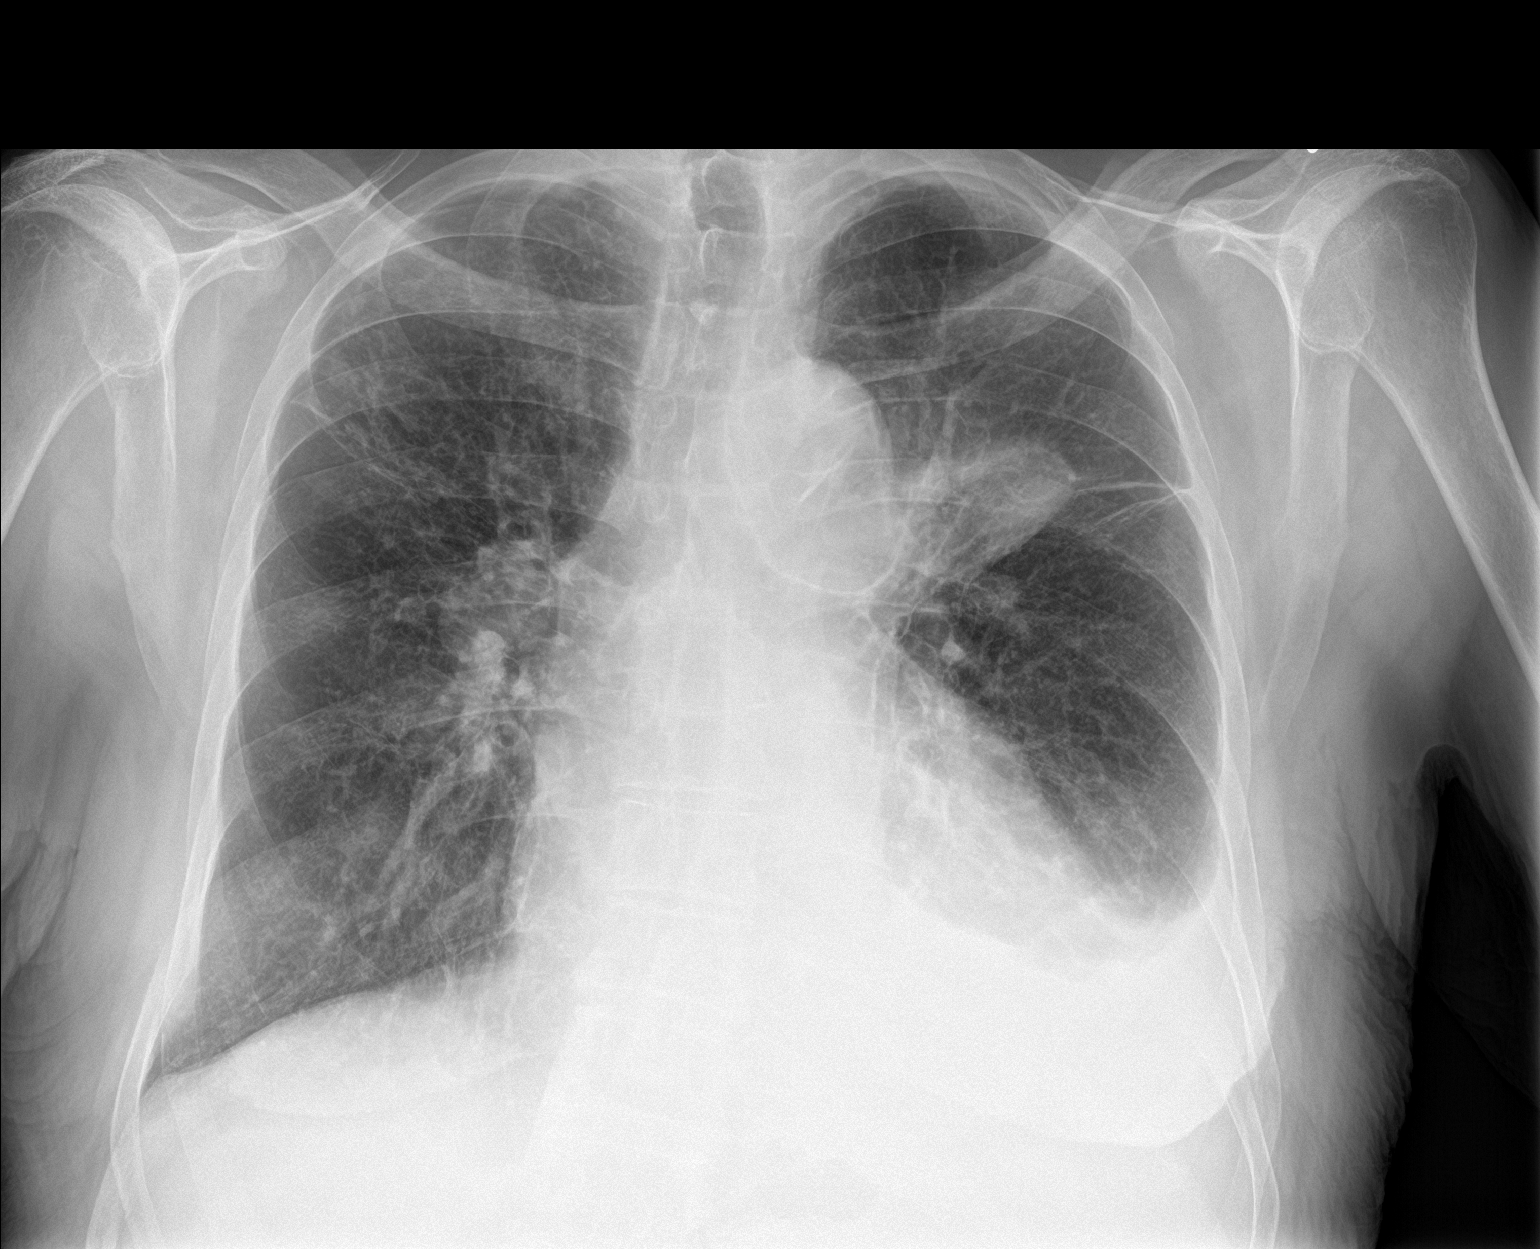

[chest lat]
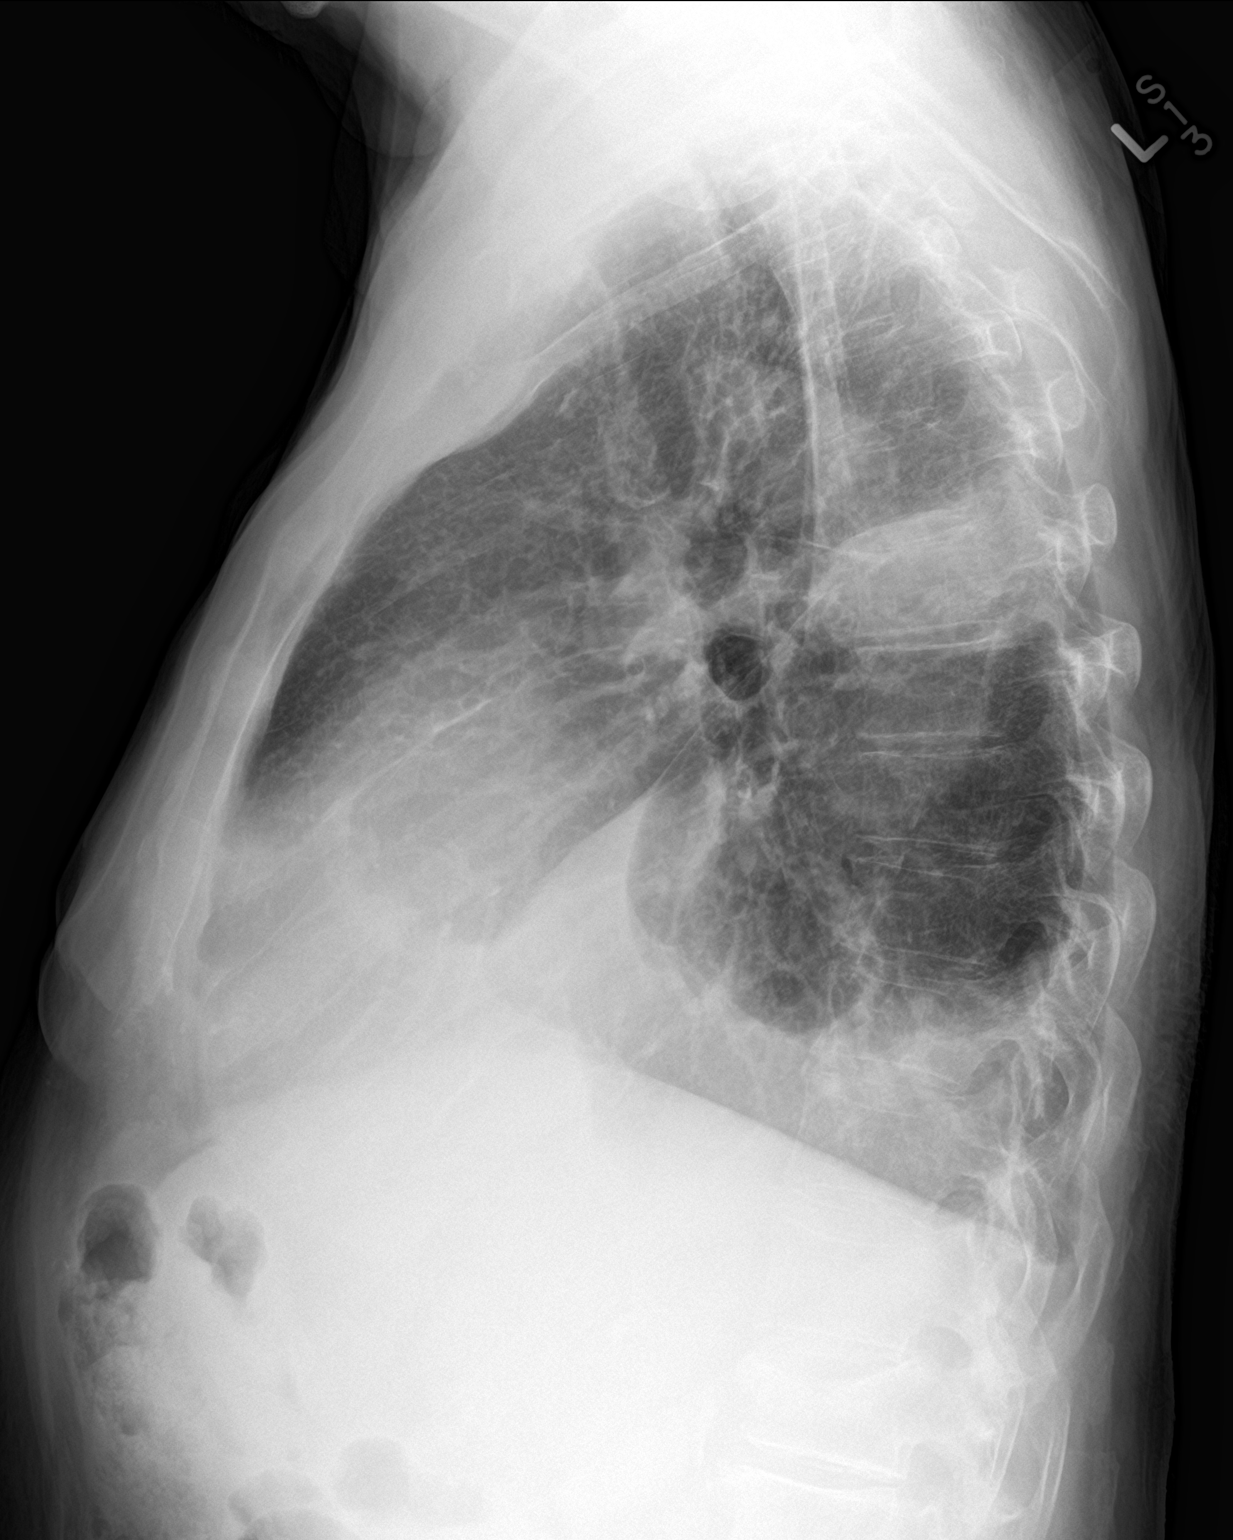

[2 of 2 positions shown; findings below may reference images not displayed]

FINDINGS: The mass in the superior segment of the left lower lobe has
decreased in size. However, there has been mild interval increase in
the left pleural effusion since the previous study. The right lung
exhibits no acute abnormality. The interstitial markings of both
lungs are mildly prominent though stable. The heart is top-normal in
size. There is no pulmonary vascular congestion. The bony thorax
exhibits no acute abnormality.
IMPRESSION: Interval decrease in the size of the left upper lobe lesion but mild
interval increase in the volume of pleural fluid on the left. There
is no pneumothorax nor mediastinal shift. There is stable changes of
COPD.

## 2016-07-31 IMAGING — CR DG CHEST 1V
1 series · 1 of 1 positions shown · non-contrast
Comparison: January 23, 2015.

CLINICAL DATA: Status post left thoracentesis.

EXAM:
CHEST 1 VIEW

[chest pa]
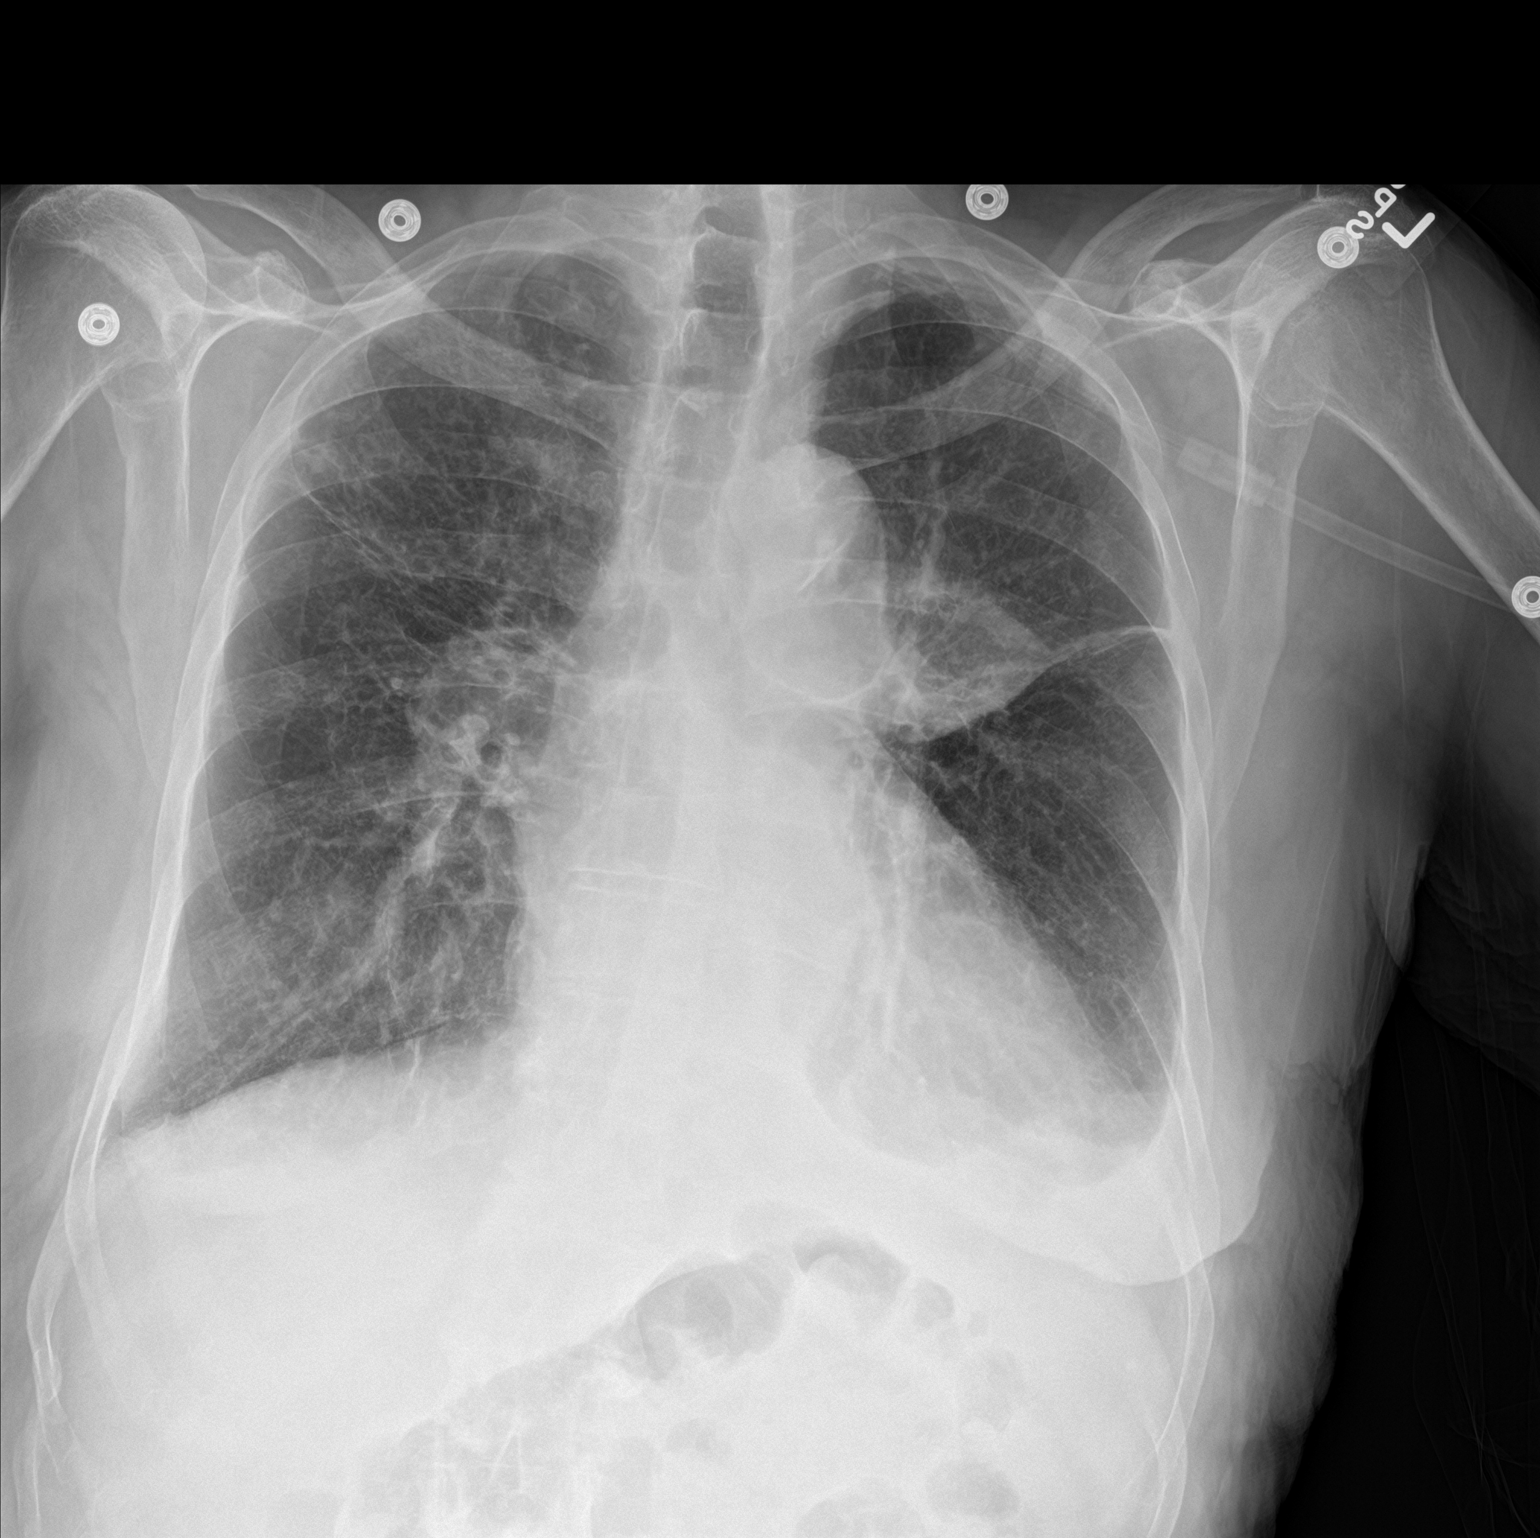

[1 of 1 positions shown; findings below may reference images not displayed]

FINDINGS: Stable cardiomediastinal silhouette. Left perihilar opacity is
unchanged compared to prior exam concerning for neoplasm. No
pneumothorax is noted. Stable small left pleural effusion is noted
with probable associated subsegmental atelectasis. Stable opacity is
noted in right upper lobe consistent with scarring or subsegmental
atelectasis. Bony thorax is unremarkable.
IMPRESSION: No pneumothorax is noted status post left-sided thoracentesis.
Stable mild left pleural effusion with associated subsegmental
atelectasis is noted. Stable left perihilar lesion is noted.

## 2016-08-02 ENCOUNTER — Encounter: Payer: Self-pay | Admitting: Internal Medicine

## 2016-08-03 DIAGNOSIS — J301 Allergic rhinitis due to pollen: Secondary | ICD-10-CM | POA: Diagnosis not present

## 2016-08-03 DIAGNOSIS — J3089 Other allergic rhinitis: Secondary | ICD-10-CM | POA: Diagnosis not present

## 2016-08-10 DIAGNOSIS — J3089 Other allergic rhinitis: Secondary | ICD-10-CM | POA: Diagnosis not present

## 2016-08-10 DIAGNOSIS — J301 Allergic rhinitis due to pollen: Secondary | ICD-10-CM | POA: Diagnosis not present

## 2016-08-11 IMAGING — DX DG CHEST 2V
2 series · 2 of 2 positions shown · non-contrast
Comparison: Chest radiograph dated 01/16/2015. CT chest dated
01/07/2015.

CLINICAL DATA: Follow-up left pleural effusion, mild dyspnea on
exertion, left chest tightness

EXAM:
CHEST  2 VIEW

[chest pa]
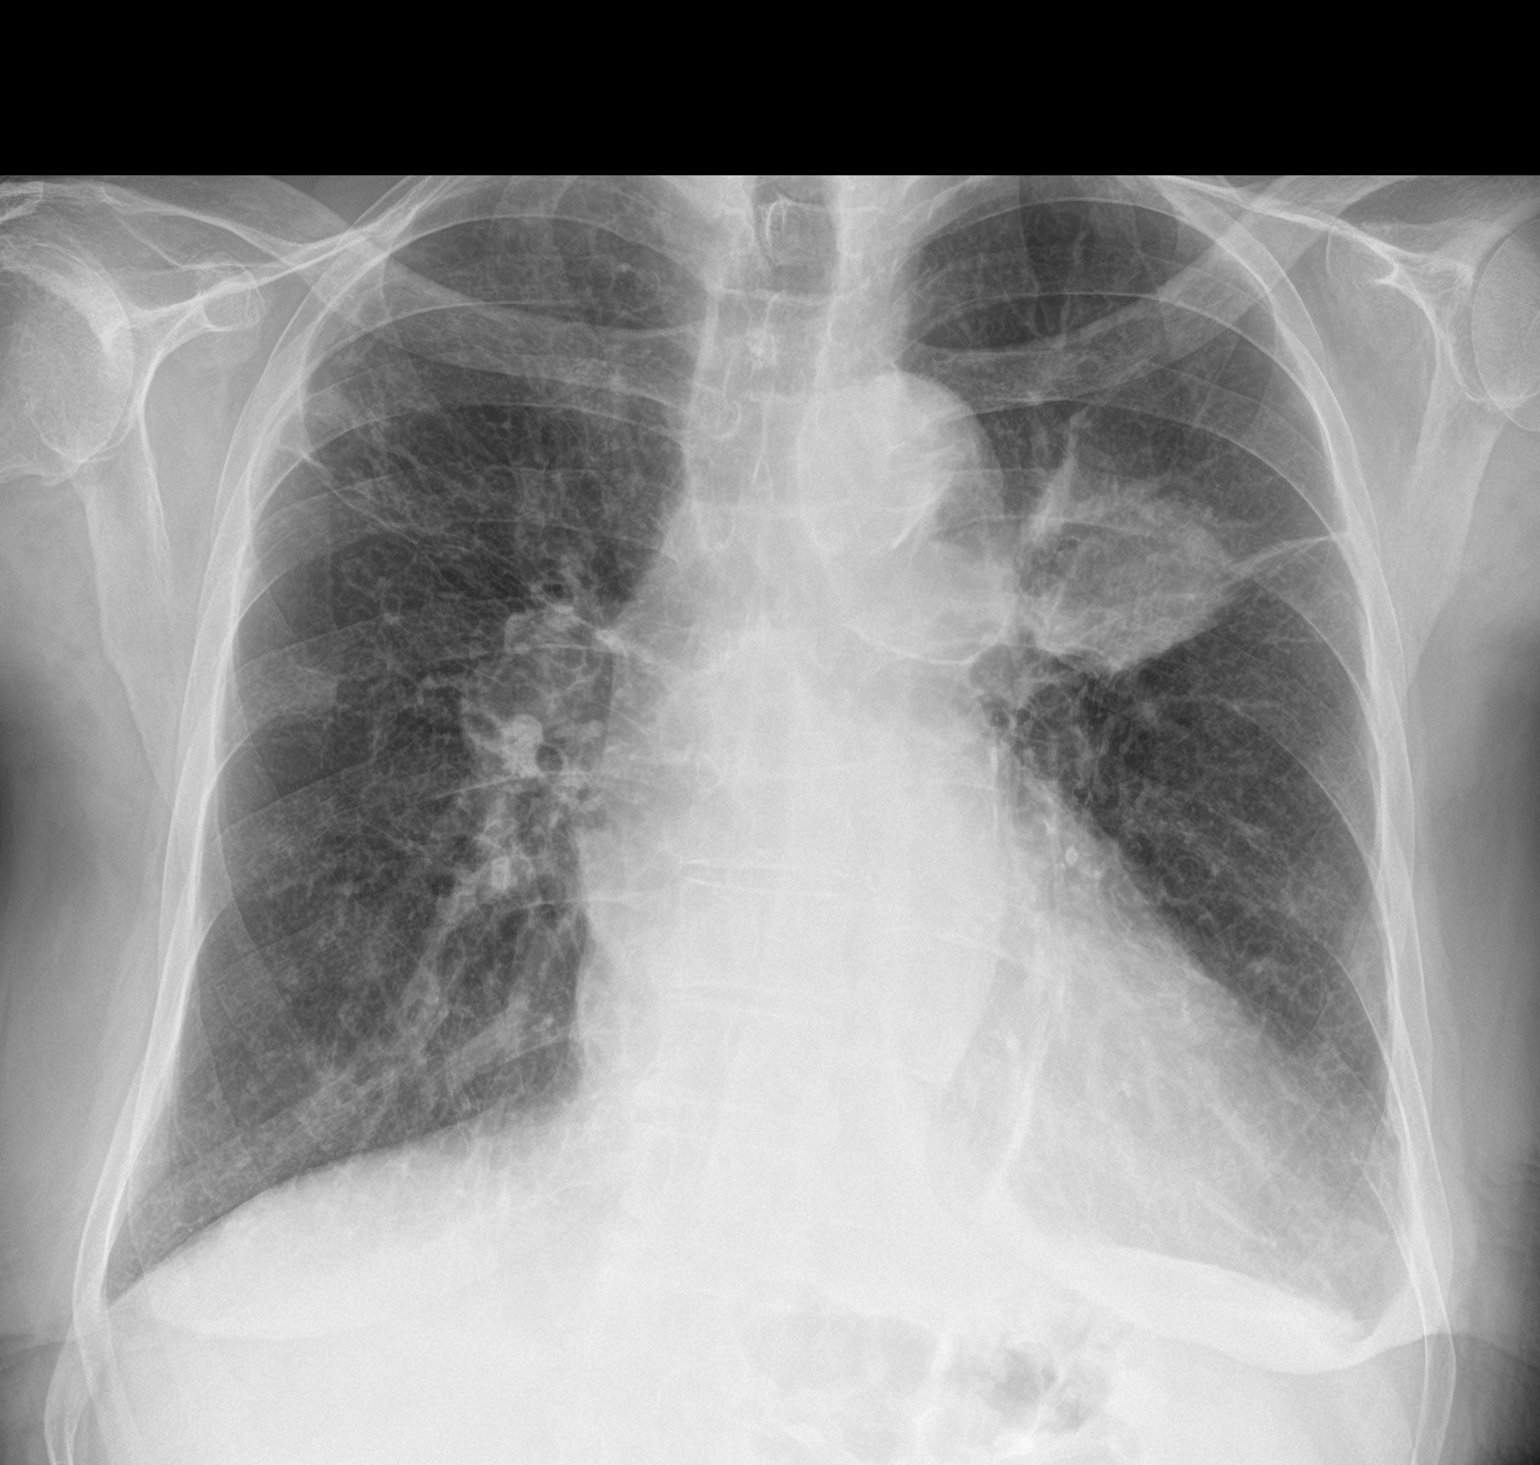

[chest lat]
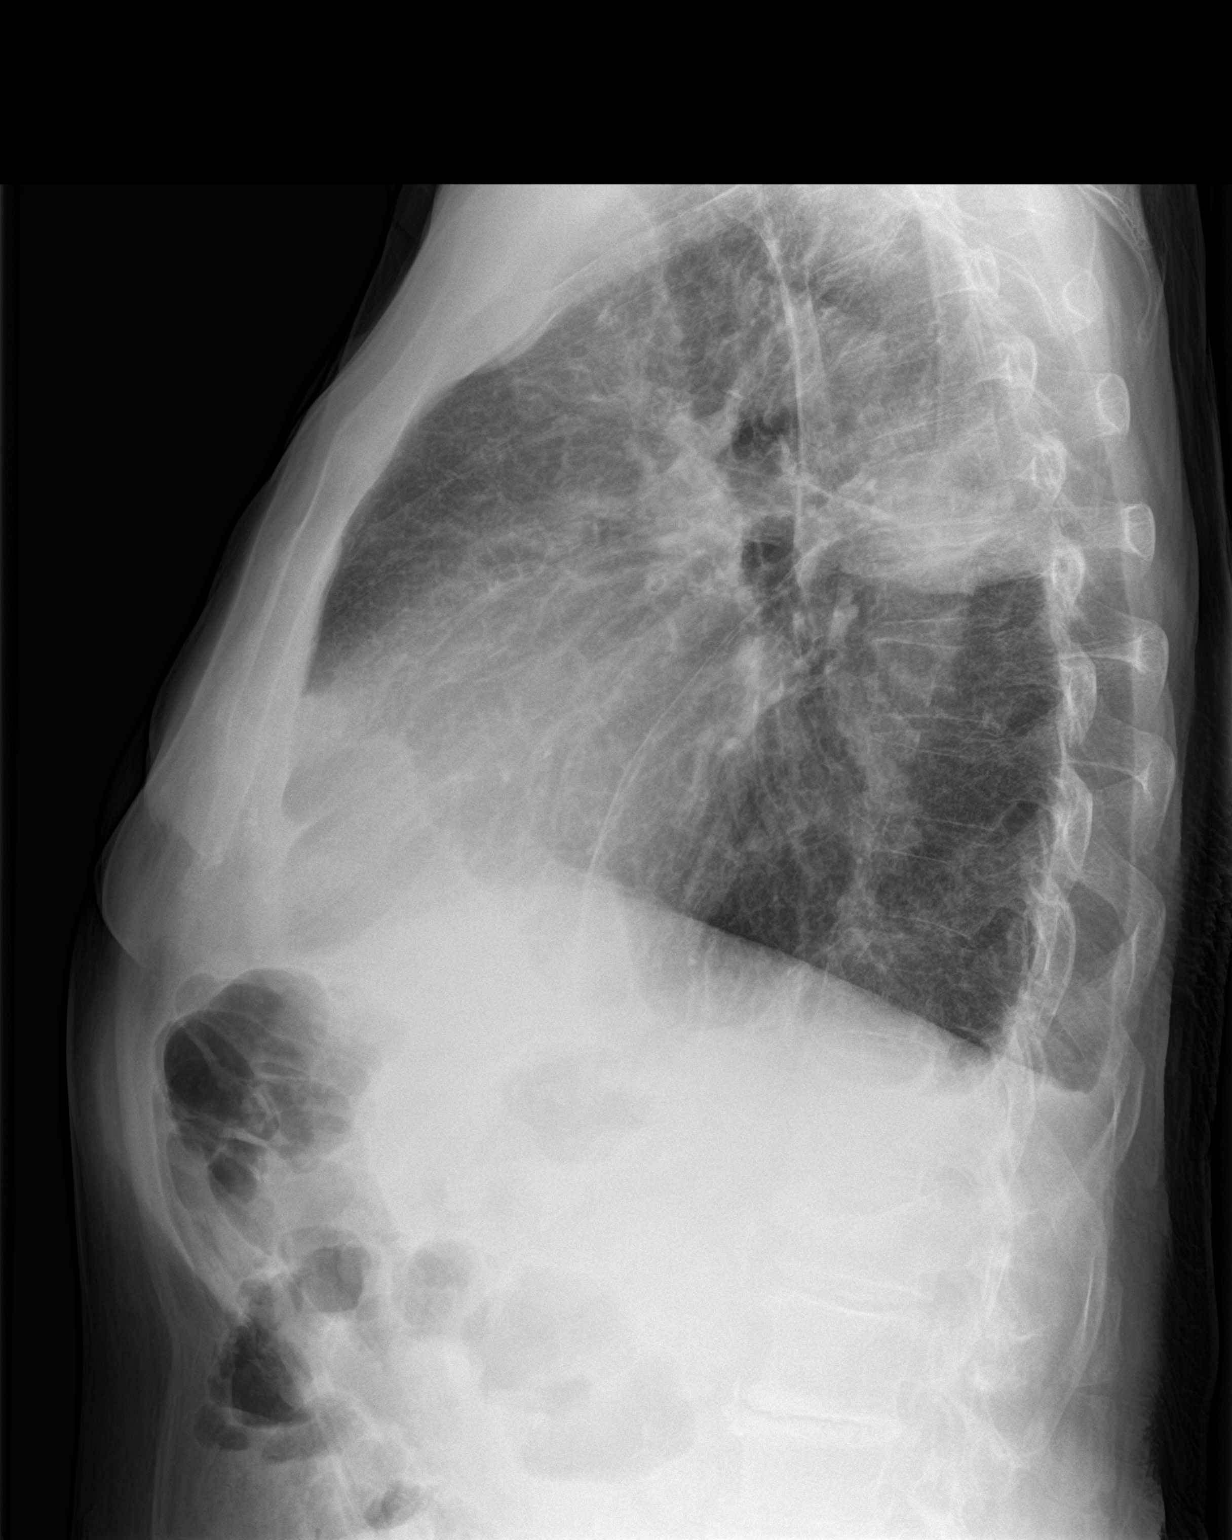

[2 of 2 positions shown; findings below may reference images not displayed]

FINDINGS: Stable superior segment left lower lobe opacity.

Small left pleural effusion, decreased.  No pneumothorax.

Right upper lobe scarring.

No pneumothorax.

Cardiomegaly.

Degenerative changes of the visualized thoracolumbar spine.
IMPRESSION: Stable superior segment left lower lobe opacity.

Small left pleural effusion, decreased.  No pneumothorax.

## 2016-08-12 DIAGNOSIS — J301 Allergic rhinitis due to pollen: Secondary | ICD-10-CM | POA: Diagnosis not present

## 2016-08-12 DIAGNOSIS — J3089 Other allergic rhinitis: Secondary | ICD-10-CM | POA: Diagnosis not present

## 2016-08-17 DIAGNOSIS — J3089 Other allergic rhinitis: Secondary | ICD-10-CM | POA: Diagnosis not present

## 2016-08-17 DIAGNOSIS — J301 Allergic rhinitis due to pollen: Secondary | ICD-10-CM | POA: Diagnosis not present

## 2016-08-18 ENCOUNTER — Encounter: Payer: Self-pay | Admitting: Internal Medicine

## 2016-08-18 ENCOUNTER — Ambulatory Visit (INDEPENDENT_AMBULATORY_CARE_PROVIDER_SITE_OTHER): Payer: Medicare Other | Admitting: Internal Medicine

## 2016-08-18 VITALS — BP 120/70 | HR 75 | Ht 70.0 in | Wt 175.0 lb

## 2016-08-18 DIAGNOSIS — I4891 Unspecified atrial fibrillation: Secondary | ICD-10-CM

## 2016-08-18 DIAGNOSIS — I10 Essential (primary) hypertension: Secondary | ICD-10-CM | POA: Diagnosis not present

## 2016-08-18 NOTE — Patient Instructions (Signed)
Medication Instructions:  Your physician recommends that you continue on your current medications as directed. Please refer to the Current Medication list given to you today.   Labwork: None Ordered   Testing/Procedures: None Ordered   Follow-Up: Your physician wants you to follow-up in: 6 months with Dr. Taylor. You will receive a reminder letter in the mail two months in advance. If you don't receive a letter, please call our office to schedule the follow-up appointment.    Any Other Special Instructions Will Be Listed Below (If Applicable).     If you need a refill on your cardiac medications before your next appointment, please call your pharmacy.   

## 2016-08-18 NOTE — Progress Notes (Signed)
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 He is a pleasant 83 yo man with a h/o atrial fibrillation.  The patient has been diagnosed with limited stage lung CA and is also thought to have a cavernous hemangioma. Since his last visit over 2 years ago, he has problems with falls and is using a walker. He had epistaxis prior to his falls and anti-coagulation was stopped. He has not had a stroke. He has class 2 dyspnea. His main complaint is generalize weakness and he has become fairly sedentary.  Allergies  Allergen Reactions  . Sulfa Antibiotics Other (See Comments)    Unsure of reaction     Current Outpatient Prescriptions  Medication Sig Dispense Refill  . albuterol (PROVENTIL HFA;VENTOLIN HFA) 108 (90 Base) MCG/ACT inhaler Inhale 1-2 puffs into the lungs every 6 (six) hours as needed for wheezing or shortness of breath.    Marland Kitchen CALCIUM PO Take 1,000 mg by mouth daily.    . cetirizine (ZYRTEC) 10 MG tablet Take 10 mg by mouth every evening.     . dicyclomine (BENTYL) 10 MG capsule Take 1 capsule (10 mg total) by mouth 4 (four) times daily -  before meals and at bedtime. 120 capsule 5  . finasteride (PROSCAR) 5 MG tablet Take 5 mg by mouth daily.     . fluticasone (FLONASE) 50 MCG/ACT nasal spray Place 2 sprays into both nostrils every morning.    . Fluticasone-Salmeterol (ADVAIR) 250-50 MCG/DOSE AEPB Inhale 1 puff into the lungs 2 (two) times daily.    Marland Kitchen HYDROcodone-acetaminophen (NORCO/VICODIN) 5-325 MG tablet Take 1-2 tablets by mouth every 4 (four) hours as needed for moderate pain. 30 tablet 0  . metoprolol tartrate (LOPRESSOR) 25 MG tablet Take 1 tablet (25 mg total) by mouth 2 (two) times daily. 60 tablet 6  . montelukast (SINGULAIR) 10 MG tablet Take 10 mg by mouth at bedtime.    Marland Kitchen omeprazole (PRILOSEC) 20 MG capsule Take 1 capsule (20 mg total) by mouth daily. 30 capsule 11  . predniSONE (DELTASONE) 10 MG tablet Take 10 mg by mouth daily.     . Probiotic Product (PROBIOTIC PO) Take 1 capsule by mouth daily.    . temazepam (RESTORIL) 15 MG capsule Take 15-30 mg by mouth at bedtime.     . Tiotropium Bromide Monohydrate (SPIRIVA RESPIMAT) 2.5 MCG/ACT AERS Inhale 2 puffs into the lungs daily.    Marland Kitchen zolpidem (AMBIEN) 10 MG tablet Take 10 mg by mouth at bedtime as needed for sleep.     No current facility-administered medications for this visit.      Past Medical History:  Diagnosis Date  . Adenocarcinoma of right lung, stage 1 ([place]) 12/11/2014   LLL/pt on 04/26/2016  . Asthma   . Atrial fibrillation ([place])   . Basal cell carcinoma    "mostly face" (04/26/2016)  . Bowel obstruction ([place])   . Bradycardia   . COPD (chronic obstructive pulmonary disease) ([facility])    mild  . Diverticulosis of colon   . DVT (deep venous thrombosis) ([facility])    LLE  . Hemorrhoids   . Hx of adenomatous colonic polyps 02/1997  . Hyperlipidemia   . Hypertension   . 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 NSTEMI (non-ST elevated myocardial infarction) ([place]) 04/25/2016  . Osteoarthritis    "back, extremities, left knee, left hip" (04/26/2016)  . Palpitations   . Pleural effusion 11/2014   "after needle ablation  for lung cancer"  . Pneumonia 1-2 times  . Squamous carcinoma    "mostly face" (04/26/2016)  . Upper respiratory symptom 03/25/2016    ROS:   All systems reviewed and negative except as noted in the HPI.   Past Surgical History:  Procedure Laterality Date  . APPENDECTOMY    . BACK SURGERY    . CARDIAC CATHETERIZATION  04/26/2016  . CARDIAC CATHETERIZATION N/A 04/26/2016   Procedure: Coronary/Graft Angiography;  Surgeon: Lorretta Harp, MD;  Location: [facility];  Service: Cardiovascular;  Laterality: N/A;  . CATARACT EXTRACTION W/ INTRAOCULAR LENS IMPLANT Right   . COLECTOMY     "large colon for diverticulitis"  . CYSTOSCOPY     "I was having prostate/bladder trouble"  . INGUINAL HERNIA REPAIR Bilateral   .  JOINT REPLACEMENT    . LUMBAR LAMINECTOMY Right     L4-L5   . SHOULDER OPEN ROTATOR CUFF REPAIR Right   . TONSILLECTOMY    . TOTAL HIP ARTHROPLASTY Right   . UMBILICAL HERNIA REPAIR    . VIDEO BRONCHOSCOPY WITH ENDOBRONCHIAL NAVIGATION N/A 12/18/2014   Procedure: VIDEO BRONCHOSCOPY WITH ENDOBRONCHIAL NAVIGATION ;  Surgeon: Collene Gobble, MD;  Location: [facility];  Service: Thoracic;  Laterality: N/A;     Family History  Problem Relation Age of Onset  . Stroke Mother 16  . Lung cancer Father 33  . Leukemia Brother   . Alzheimer's disease Sister   . Autoimmune disease Brother        Polyarteritis nodosa     Social History   Social History  . Marital status: Widowed    Spouse name: N/A  . Number of children: N/A  . Years of education: N/A   Occupational History  . retired physician    Social History Main Topics  . Smoking status: Former Smoker    Packs/day: 1.50    Years: 13.00    Types: Cigarettes    Quit date: 03/29/1956  . Smokeless tobacco: Never Used  . Alcohol use Yes     Comment: 04/26/2016 "nothing in the last 3-4 months; did drink 2 glasses of wine/night"  . Drug use: No  . Sexual activity: No   Other Topics Concern  . 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 HEART:  IRegular rate rhythm, no murmurs, no rubs, no clicks Abd:  soft, positive bowel sounds, no organomegally, no rebound, no guarding Ext:  2 plus pulses, no edema, no cyanosis, no clubbing Skin:  No rashes no nodules Neuro:  CN II through XII intact, motor grossly intact  EKG - atrial fibrillation with a controlled ventricular  response  Assess/Plan: 1. Atrial fib - his ventricular rates are controlled. He understands that he is at risk for stroke. 2. HTN - his blood pressure is well controlled.  3. Lung CA - he appears stable with no hemoptysis or chest pain 4. Dyslipidemia - he is on dietary therapy only. His weight is good.  Mikle Bosworth.D.

## 2016-08-19 DIAGNOSIS — J3089 Other allergic rhinitis: Secondary | ICD-10-CM | POA: Diagnosis not present

## 2016-08-19 DIAGNOSIS — J301 Allergic rhinitis due to pollen: Secondary | ICD-10-CM | POA: Diagnosis not present

## 2016-08-20 ENCOUNTER — Encounter: Payer: Self-pay | Admitting: Physician Assistant

## 2016-08-20 ENCOUNTER — Ambulatory Visit (INDEPENDENT_AMBULATORY_CARE_PROVIDER_SITE_OTHER): Payer: Medicare Other | Admitting: Physician Assistant

## 2016-08-20 VITALS — BP 134/82 | HR 68 | Temp 97.4°F | Resp 17 | Ht 68.5 in | Wt 177.0 lb

## 2016-08-20 DIAGNOSIS — H6123 Impacted cerumen, bilateral: Secondary | ICD-10-CM | POA: Diagnosis not present

## 2016-08-20 NOTE — Progress Notes (Signed)
[name]  MRN: [id number] DOB: 1924/11/22  PCP: [name] Redwood, MD  Chief Complaint  Patient presents with  . wants ears cleaned    Subjective:  Pt presents to clinic for ears to be cleaned before going and getting his hearing aids adjusted later today.  He has noticed decrease amplification with his hearing aids and he has had trouble with the  Review of Systems  HENT: Positive for hearing loss.     Patient Active Problem List   Diagnosis Date Noted  . Stress-induced cardiomyopathy   . NSTEMI (non-ST elevated myocardial infarction) ([place]) 04/25/2016  . Upper respiratory symptom 03/25/2016  . Left arm swelling 03/25/2016  . Troponin I above reference range 03/10/2016  . Shoulder dislocation, left, initial encounter 03/10/2016  . 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 Right elbow pain 12/02/2015  . Cellulitis 10/15/2015  . Pleural effusion 01/23/2015  . Acute respiratory failure with hypoxia ([place]) 01/07/2015  . Permanent atrial fibrillation ([place]) 01/07/2015  . Acute respiratory failure ([place]) 01/07/2015  . Shortness of breath 01/06/2015  . Patellar tendinitis of right knee 12/12/2014  . Adenocarcinoma of right lung, stage 1 ([place]) 12/11/2014  . Traumatic rotator cuff tear 11/07/2014  . Compression fracture of L1 lumbar vertebra (H[facility]) 11/07/2014  . Lumbar degenerative disc disease 09/11/2014  . Osteoarthritis of right knee 09/11/2014  . Chronic anticoagulation 11/22/2012  . Dyslipidemia 08/06/2008  . COLONIC POLYPS, ADENOMATOUS 07/17/2008  . Essential hypertension 07/17/2008  . BRADYCARDIA 07/17/2008  . HEMORRHOIDS 07/17/2008  . Asthma 07/17/2008  . DIVERTICULOSIS, COLON 07/17/2008  . PALPITATIONS 07/17/2008    Current Outpatient Prescriptions on File Prior to Visit  Medication Sig Dispense Refill  . albuterol (PROVENTIL HFA;VENTOLIN HFA) 108 (90 Base) MCG/ACT inhaler Inhale 1-2 puffs into the lungs every 6  (six) hours as needed for wheezing or shortness of breath.    Marland Kitchen CALCIUM PO Take 1,000 mg by mouth daily.    . cetirizine (ZYRTEC) 10 MG tablet Take 10 mg by mouth every evening.     . dicyclomine (BENTYL) 10 MG capsule Take 1 capsule (10 mg total) by mouth 4 (four) times daily -  before meals and at bedtime. 120 capsule 5  . finasteride (PROSCAR) 5 MG tablet Take 5 mg by mouth daily.     . fluticasone (FLONASE) 50 MCG/ACT nasal spray Place 2 sprays into both nostrils every morning.    . Fluticasone-Salmeterol (ADVAIR) 250-50 MCG/DOSE AEPB Inhale 1 puff into the lungs 2 (two) times daily.    Marland Kitchen HYDROcodone-acetaminophen (NORCO/VICODIN) 5-325 MG tablet Take 1-2 tablets by mouth every 4 (four) hours as needed for moderate pain. 30 tablet 0  . metoprolol tartrate (LOPRESSOR) 25 MG tablet Take 1 tablet (25 mg total) by mouth 2 (two) times daily. 60 tablet 6  . montelukast (SINGULAIR) 10 MG tablet Take 10 mg by mouth at bedtime.    Marland Kitchen omeprazole (PRILOSEC) 20 MG capsule Take 1 capsule (20 mg total) by mouth daily. 30 capsule 11  . predniSONE (DELTASONE) 10 MG tablet Take 10 mg by mouth daily.    . Probiotic Product (PROBIOTIC PO) Take 1 capsule by mouth daily.    . temazepam (RESTORIL) 15 MG capsule Take 15-30 mg by mouth at bedtime.     . Tiotropium Bromide Monohydrate (SPIRIVA RESPIMAT) 2.5 MCG/ACT AERS Inhale 2 puffs into the lungs daily.    Marland Kitchen zolpidem (AMBIEN) 10 MG tablet Take 10 mg by mouth at  bedtime as needed for sleep.    . [DISCONTINUED] beclomethasone (QVAR) 80 MCG/ACT inhaler Inhale 2 puffs into the lungs 2 (two) times daily.     No current facility-administered medications on file prior to visit.     Allergies  Allergen Reactions  . Sulfa Antibiotics Other (See Comments)    Unsure of reaction    Pt patients past, family and social history were reviewed and updated.   Objective:  BP 134/82   Pulse 68   Temp 97.4 F (36.3 C) (Oral)   Resp 17   Ht 5' 8.5" (1.74 m)   Wt 177 lb  (80.3 kg)   SpO2 97%   BMI 26.52 kg/m   Physical Exam  Constitutional: He is oriented to person, place, and time and well-developed, well-nourished, and in no distress.  HENT:  Head: Normocephalic and atraumatic.  Right Ear: External ear normal.  Left Ear: External ear normal.  Cerumen bilaterally in ears - not impacted but removed today with lavage for hearing aid adjustment to be done properly.  Eyes: Conjunctivae are normal.  Neck: Normal range of motion.  Pulmonary/Chest: Effort normal.  Neurological: He is alert and oriented to person, place, and time. Gait normal.  Skin: Skin is warm and dry.  Psychiatric: Mood, memory, affect and judgment normal.    Assessment and Plan :  Bilateral impacted cerumen removed without difficulty   Windell Hummingbird PA-C  [facility] 08/20/2016 10:40 AM

## 2016-08-20 NOTE — Patient Instructions (Signed)
     IF you received an x-ray today, you will receive an invoice from [facility]. Please contact [facility] at [phone] with questions or concerns regarding your invoice.   IF you received labwork today, you will receive an invoice from [facility]. Please contact [facility] at [phone] with questions or concerns regarding your invoice.   Our billing staff will not be able to assist you with questions regarding bills from these companies.  You will be contacted with the lab results as soon as they are available. The fastest way to get your results is to activate your My Chart account. Instructions are located on the last page of this paperwork. If you have not heard from us regarding the results in 2 weeks, please contact this office.     

## 2016-08-24 DIAGNOSIS — R0600 Dyspnea, unspecified: Secondary | ICD-10-CM | POA: Diagnosis not present

## 2016-08-24 DIAGNOSIS — R05 Cough: Secondary | ICD-10-CM | POA: Diagnosis not present

## 2016-08-24 DIAGNOSIS — Z6825 Body mass index (BMI) 25.0-25.9, adult: Secondary | ICD-10-CM | POA: Diagnosis not present

## 2016-08-24 DIAGNOSIS — R140 Abdominal distension (gaseous): Secondary | ICD-10-CM | POA: Diagnosis not present

## 2016-08-24 DIAGNOSIS — J3089 Other allergic rhinitis: Secondary | ICD-10-CM | POA: Diagnosis not present

## 2016-08-24 DIAGNOSIS — C3412 Malignant neoplasm of upper lobe, left bronchus or lung: Secondary | ICD-10-CM | POA: Diagnosis not present

## 2016-08-24 DIAGNOSIS — J301 Allergic rhinitis due to pollen: Secondary | ICD-10-CM | POA: Diagnosis not present

## 2016-08-31 DIAGNOSIS — J301 Allergic rhinitis due to pollen: Secondary | ICD-10-CM | POA: Diagnosis not present

## 2016-08-31 DIAGNOSIS — J3089 Other allergic rhinitis: Secondary | ICD-10-CM | POA: Diagnosis not present

## 2016-09-01 DIAGNOSIS — R531 Weakness: Secondary | ICD-10-CM | POA: Diagnosis not present

## 2016-09-03 DIAGNOSIS — M810 Age-related osteoporosis without current pathological fracture: Secondary | ICD-10-CM | POA: Diagnosis not present

## 2016-09-03 DIAGNOSIS — C3412 Malignant neoplasm of upper lobe, left bronchus or lung: Secondary | ICD-10-CM | POA: Diagnosis not present

## 2016-09-03 DIAGNOSIS — I4891 Unspecified atrial fibrillation: Secondary | ICD-10-CM | POA: Diagnosis not present

## 2016-09-03 DIAGNOSIS — J449 Chronic obstructive pulmonary disease, unspecified: Secondary | ICD-10-CM | POA: Diagnosis not present

## 2016-09-03 DIAGNOSIS — G629 Polyneuropathy, unspecified: Secondary | ICD-10-CM | POA: Diagnosis not present

## 2016-09-03 DIAGNOSIS — I10 Essential (primary) hypertension: Secondary | ICD-10-CM | POA: Diagnosis not present

## 2016-09-06 DIAGNOSIS — I10 Essential (primary) hypertension: Secondary | ICD-10-CM | POA: Diagnosis not present

## 2016-09-06 DIAGNOSIS — I4891 Unspecified atrial fibrillation: Secondary | ICD-10-CM | POA: Diagnosis not present

## 2016-09-06 DIAGNOSIS — M810 Age-related osteoporosis without current pathological fracture: Secondary | ICD-10-CM | POA: Diagnosis not present

## 2016-09-06 DIAGNOSIS — G629 Polyneuropathy, unspecified: Secondary | ICD-10-CM | POA: Diagnosis not present

## 2016-09-06 DIAGNOSIS — J449 Chronic obstructive pulmonary disease, unspecified: Secondary | ICD-10-CM | POA: Diagnosis not present

## 2016-09-06 DIAGNOSIS — C3412 Malignant neoplasm of upper lobe, left bronchus or lung: Secondary | ICD-10-CM | POA: Diagnosis not present

## 2016-09-07 DIAGNOSIS — J449 Chronic obstructive pulmonary disease, unspecified: Secondary | ICD-10-CM | POA: Diagnosis not present

## 2016-09-07 DIAGNOSIS — M810 Age-related osteoporosis without current pathological fracture: Secondary | ICD-10-CM | POA: Diagnosis not present

## 2016-09-07 DIAGNOSIS — C3412 Malignant neoplasm of upper lobe, left bronchus or lung: Secondary | ICD-10-CM | POA: Diagnosis not present

## 2016-09-07 DIAGNOSIS — J301 Allergic rhinitis due to pollen: Secondary | ICD-10-CM | POA: Diagnosis not present

## 2016-09-07 DIAGNOSIS — J3089 Other allergic rhinitis: Secondary | ICD-10-CM | POA: Diagnosis not present

## 2016-09-07 DIAGNOSIS — I4891 Unspecified atrial fibrillation: Secondary | ICD-10-CM | POA: Diagnosis not present

## 2016-09-07 DIAGNOSIS — I10 Essential (primary) hypertension: Secondary | ICD-10-CM | POA: Diagnosis not present

## 2016-09-07 DIAGNOSIS — G629 Polyneuropathy, unspecified: Secondary | ICD-10-CM | POA: Diagnosis not present

## 2016-09-08 ENCOUNTER — Encounter: Payer: Self-pay | Admitting: Gastroenterology

## 2016-09-08 ENCOUNTER — Ambulatory Visit (INDEPENDENT_AMBULATORY_CARE_PROVIDER_SITE_OTHER): Payer: Medicare Other | Admitting: Gastroenterology

## 2016-09-08 VITALS — BP 100/58 | HR 64 | Ht 68.5 in | Wt 167.0 lb

## 2016-09-08 DIAGNOSIS — R197 Diarrhea, unspecified: Secondary | ICD-10-CM | POA: Diagnosis not present

## 2016-09-08 DIAGNOSIS — D508 Other iron deficiency anemias: Secondary | ICD-10-CM | POA: Diagnosis not present

## 2016-09-08 DIAGNOSIS — R140 Abdominal distension (gaseous): Secondary | ICD-10-CM | POA: Diagnosis not present

## 2016-09-08 NOTE — Patient Instructions (Signed)
Your physician has requested that you go to the basement for lab work before leaving today.  Stop taking your Bentyl and start IB Gard samples 1-2 capsules by mouth three times a day. We have given you a coupon to take to your pharmacy for this to be purchased over the counter.   Follow the instructions on the Hemoccult cards and mail them back to [place] when you are finished or you may take them directly to the lab in the basement of the [facility] building. We will call you with the results.   Thank you for choosing me and [facility].  Pricilla Riffle. Dagoberto Ligas., MD., [facility]

## 2016-09-08 NOTE — Progress Notes (Signed)
    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 He also relates frequent problems with diarrhea-some days he notes 1 formed bowel movement and on other days he notes 3-4 loose bowel movements. His symptoms were improved at his last office visit however he states shortly following his last office visit his symptoms returned and he does not feel lactose intolerance is the problem. Dicyclomine only provides minimal relief symptoms. States his abdominal discomfort generally begins before a meal persist through a meal and resolves a few hours after a meal. His appetite is only fair and he states he eats because he knows he needs the calories although frequently he is not hungry for meal.  Current Medications, Allergies, Past Medical History, Past Surgical History, Family History and Social History were reviewed in [employer] record.  Physical Exam: General: Well developed, well nourished, no acute distress Head: Normocephalic and atraumatic Eyes:  sclerae anicteric, EOMI Ears: Normal auditory acuity Mouth: No deformity or lesions Lungs: Clear throughout to auscultation Heart: Regular rate and rhythm; no murmurs, rubs or bruits Abdomen: Soft, non tender and non distended. No masses, hepatosplenomegaly or hernias noted. Normal Bowel sounds Musculoskeletal: Symmetrical with no gross deformities  Pulses:  Normal pulses noted Extremities: No clubbing, cyanosis, edema or deformities noted Neurological: Alert oriented x 4, grossly nonfocal Psychological:  Alert and cooperative. Normal mood and affect  Assessment and Recommendations:  1. Abdominal discomfort, bloating, diarrhea - etiology unclear. R/O symptomatic cholelithiasis, IBS, pancreatic insufficiency, infectious diarrhea. Stop dicyclomine, start IBgard. Stool for GI pathogen panel, fecal elastase, Hemoccults. REV in 1 month.   2. Iron  deficiency anemia. Colonoscopy and EGD were recommended and the patient has declined. I again recommended colonoscopy and EGD for further evaluation of iron deficiency and his complaints outlined in problem #1. He states he would like to try to avoid procedures however he will consider them if his symptoms do not improve.Marland Kitchen

## 2016-09-10 DIAGNOSIS — G629 Polyneuropathy, unspecified: Secondary | ICD-10-CM | POA: Diagnosis not present

## 2016-09-10 DIAGNOSIS — C3412 Malignant neoplasm of upper lobe, left bronchus or lung: Secondary | ICD-10-CM | POA: Diagnosis not present

## 2016-09-10 DIAGNOSIS — J449 Chronic obstructive pulmonary disease, unspecified: Secondary | ICD-10-CM | POA: Diagnosis not present

## 2016-09-10 DIAGNOSIS — I4891 Unspecified atrial fibrillation: Secondary | ICD-10-CM | POA: Diagnosis not present

## 2016-09-10 DIAGNOSIS — I10 Essential (primary) hypertension: Secondary | ICD-10-CM | POA: Diagnosis not present

## 2016-09-10 DIAGNOSIS — M810 Age-related osteoporosis without current pathological fracture: Secondary | ICD-10-CM | POA: Diagnosis not present

## 2016-09-13 DIAGNOSIS — G629 Polyneuropathy, unspecified: Secondary | ICD-10-CM | POA: Diagnosis not present

## 2016-09-13 DIAGNOSIS — I4891 Unspecified atrial fibrillation: Secondary | ICD-10-CM | POA: Diagnosis not present

## 2016-09-13 DIAGNOSIS — J449 Chronic obstructive pulmonary disease, unspecified: Secondary | ICD-10-CM | POA: Diagnosis not present

## 2016-09-13 DIAGNOSIS — C3412 Malignant neoplasm of upper lobe, left bronchus or lung: Secondary | ICD-10-CM | POA: Diagnosis not present

## 2016-09-13 DIAGNOSIS — M810 Age-related osteoporosis without current pathological fracture: Secondary | ICD-10-CM | POA: Diagnosis not present

## 2016-09-13 DIAGNOSIS — I10 Essential (primary) hypertension: Secondary | ICD-10-CM | POA: Diagnosis not present

## 2016-09-14 DIAGNOSIS — J3089 Other allergic rhinitis: Secondary | ICD-10-CM | POA: Diagnosis not present

## 2016-09-14 DIAGNOSIS — J301 Allergic rhinitis due to pollen: Secondary | ICD-10-CM | POA: Diagnosis not present

## 2016-09-15 DIAGNOSIS — M810 Age-related osteoporosis without current pathological fracture: Secondary | ICD-10-CM | POA: Diagnosis not present

## 2016-09-15 DIAGNOSIS — G629 Polyneuropathy, unspecified: Secondary | ICD-10-CM | POA: Diagnosis not present

## 2016-09-15 DIAGNOSIS — J449 Chronic obstructive pulmonary disease, unspecified: Secondary | ICD-10-CM | POA: Diagnosis not present

## 2016-09-15 DIAGNOSIS — C3412 Malignant neoplasm of upper lobe, left bronchus or lung: Secondary | ICD-10-CM | POA: Diagnosis not present

## 2016-09-15 DIAGNOSIS — I10 Essential (primary) hypertension: Secondary | ICD-10-CM | POA: Diagnosis not present

## 2016-09-15 DIAGNOSIS — I4891 Unspecified atrial fibrillation: Secondary | ICD-10-CM | POA: Diagnosis not present

## 2016-09-16 DIAGNOSIS — M810 Age-related osteoporosis without current pathological fracture: Secondary | ICD-10-CM | POA: Diagnosis not present

## 2016-09-16 DIAGNOSIS — G629 Polyneuropathy, unspecified: Secondary | ICD-10-CM | POA: Diagnosis not present

## 2016-09-16 DIAGNOSIS — I10 Essential (primary) hypertension: Secondary | ICD-10-CM | POA: Diagnosis not present

## 2016-09-16 DIAGNOSIS — C3412 Malignant neoplasm of upper lobe, left bronchus or lung: Secondary | ICD-10-CM | POA: Diagnosis not present

## 2016-09-16 DIAGNOSIS — J449 Chronic obstructive pulmonary disease, unspecified: Secondary | ICD-10-CM | POA: Diagnosis not present

## 2016-09-16 DIAGNOSIS — I4891 Unspecified atrial fibrillation: Secondary | ICD-10-CM | POA: Diagnosis not present

## 2016-09-17 DIAGNOSIS — C3412 Malignant neoplasm of upper lobe, left bronchus or lung: Secondary | ICD-10-CM | POA: Diagnosis not present

## 2016-09-17 DIAGNOSIS — G629 Polyneuropathy, unspecified: Secondary | ICD-10-CM | POA: Diagnosis not present

## 2016-09-17 DIAGNOSIS — M810 Age-related osteoporosis without current pathological fracture: Secondary | ICD-10-CM | POA: Diagnosis not present

## 2016-09-17 DIAGNOSIS — J449 Chronic obstructive pulmonary disease, unspecified: Secondary | ICD-10-CM | POA: Diagnosis not present

## 2016-09-17 DIAGNOSIS — I10 Essential (primary) hypertension: Secondary | ICD-10-CM | POA: Diagnosis not present

## 2016-09-17 DIAGNOSIS — I4891 Unspecified atrial fibrillation: Secondary | ICD-10-CM | POA: Diagnosis not present

## 2016-09-22 DIAGNOSIS — M25561 Pain in right knee: Secondary | ICD-10-CM | POA: Diagnosis not present

## 2016-09-22 DIAGNOSIS — G8929 Other chronic pain: Secondary | ICD-10-CM | POA: Diagnosis not present

## 2016-09-22 DIAGNOSIS — M1711 Unilateral primary osteoarthritis, right knee: Secondary | ICD-10-CM | POA: Diagnosis not present

## 2016-09-28 DIAGNOSIS — J301 Allergic rhinitis due to pollen: Secondary | ICD-10-CM | POA: Diagnosis not present

## 2016-09-28 DIAGNOSIS — J3089 Other allergic rhinitis: Secondary | ICD-10-CM | POA: Diagnosis not present

## 2016-09-30 DIAGNOSIS — D3617 Benign neoplasm of peripheral nerves and autonomic nervous system of trunk, unspecified: Secondary | ICD-10-CM | POA: Diagnosis not present

## 2016-09-30 DIAGNOSIS — D225 Melanocytic nevi of trunk: Secondary | ICD-10-CM | POA: Diagnosis not present

## 2016-09-30 DIAGNOSIS — D1801 Hemangioma of skin and subcutaneous tissue: Secondary | ICD-10-CM | POA: Diagnosis not present

## 2016-09-30 DIAGNOSIS — L821 Other seborrheic keratosis: Secondary | ICD-10-CM | POA: Diagnosis not present

## 2016-09-30 DIAGNOSIS — Z85828 Personal history of other malignant neoplasm of skin: Secondary | ICD-10-CM | POA: Diagnosis not present

## 2016-09-30 DIAGNOSIS — D045 Carcinoma in situ of skin of trunk: Secondary | ICD-10-CM | POA: Diagnosis not present

## 2016-09-30 DIAGNOSIS — D692 Other nonthrombocytopenic purpura: Secondary | ICD-10-CM | POA: Diagnosis not present

## 2016-09-30 DIAGNOSIS — L570 Actinic keratosis: Secondary | ICD-10-CM | POA: Diagnosis not present

## 2016-10-01 DIAGNOSIS — G629 Polyneuropathy, unspecified: Secondary | ICD-10-CM | POA: Diagnosis not present

## 2016-10-01 DIAGNOSIS — C3412 Malignant neoplasm of upper lobe, left bronchus or lung: Secondary | ICD-10-CM | POA: Diagnosis not present

## 2016-10-01 DIAGNOSIS — I10 Essential (primary) hypertension: Secondary | ICD-10-CM | POA: Diagnosis not present

## 2016-10-01 DIAGNOSIS — J449 Chronic obstructive pulmonary disease, unspecified: Secondary | ICD-10-CM | POA: Diagnosis not present

## 2016-10-01 DIAGNOSIS — M810 Age-related osteoporosis without current pathological fracture: Secondary | ICD-10-CM | POA: Diagnosis not present

## 2016-10-01 DIAGNOSIS — I4891 Unspecified atrial fibrillation: Secondary | ICD-10-CM | POA: Diagnosis not present

## 2016-10-12 ENCOUNTER — Encounter: Payer: Self-pay | Admitting: Gastroenterology

## 2016-10-12 ENCOUNTER — Other Ambulatory Visit: Payer: Medicare Other

## 2016-10-12 ENCOUNTER — Ambulatory Visit (INDEPENDENT_AMBULATORY_CARE_PROVIDER_SITE_OTHER): Payer: Medicare Other | Admitting: Gastroenterology

## 2016-10-12 VITALS — BP 130/80 | HR 72 | Ht 68.5 in | Wt 175.0 lb

## 2016-10-12 DIAGNOSIS — J301 Allergic rhinitis due to pollen: Secondary | ICD-10-CM | POA: Diagnosis not present

## 2016-10-12 DIAGNOSIS — R109 Unspecified abdominal pain: Secondary | ICD-10-CM

## 2016-10-12 DIAGNOSIS — R140 Abdominal distension (gaseous): Secondary | ICD-10-CM

## 2016-10-12 DIAGNOSIS — K219 Gastro-esophageal reflux disease without esophagitis: Secondary | ICD-10-CM

## 2016-10-12 DIAGNOSIS — J3089 Other allergic rhinitis: Secondary | ICD-10-CM | POA: Diagnosis not present

## 2016-10-12 DIAGNOSIS — R197 Diarrhea, unspecified: Secondary | ICD-10-CM

## 2016-10-12 MED ORDER — PB-HYOSCY-ATROPINE-SCOPOLAMINE 16.2 MG PO TABS: 1.0000 | ORAL_TABLET | Freq: Three times a day (TID) | ORAL | 5 refills | Status: DC | PRN

## 2016-10-12 NOTE — Progress Notes (Signed)
    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 Diarrhea  improved substantially and now occurs only intermittently. Early satiety has improved. He is returning stool studies today as he recently had a few episodes of diarrhea. His abdominal bloating and cramping persist-partially relieved with dicyclomine. He states IBgard was very effective for 2 days and then it stopped working so he discontinued it.  Current Medications, Allergies, Past Medical History, Past Surgical History, Family History and Social History were reviewed in [employer] record.  Physical Exam: General: Well developed, well nourished, no acute distress Head: Normocephalic and atraumatic Eyes:  sclerae anicteric, EOMI Neurological: Alert oriented x 4, grossly nonfocal Psychological:  Alert and cooperative. Normal mood and affect  Assessment and Recommendations:  1. Abdominal discomfort, bloating, gas, intermitting diarrhea. DC dicyclomine. Start Donnatal 16.2 mg po tid prn. Gas-X qid prn. Avoid foods that exacerbate symptoms. Advised patient to call my office with a progress report in 2 weeks for further plans. If Donnatal is effective will continue and plan for return office visit in 6 months. I  2. Iron deficiency anemia with Hemoccult negative stool. Patient has declined colonoscopy and EGD. Continue iron twice daily and follow up with PCP.  3. Hepatic hemangioma. No further evaluation.  4. Cholelithiasis. No clear biliary symptoms.  5. GERD. Continue omeprazole 20 mg daily and standard antireflux measures.  I spent 15 minutes of face-to-face time with the patient. Greater than 50% of the time was spent counseling and coordinating care.

## 2016-10-12 NOTE — Patient Instructions (Signed)
We have sent the following medications to your pharmacy for you to pick up at your convenience: Donnatal  Please call us in 2 weeks to update [place] on your progress.  Normal BMI (Body Mass Index- based on height and weight) is between 23 and 30. Your BMI today is Body mass index is 26.22 kg/m. Marland Kitchen Please consider follow up  regarding your BMI with your Primary Care Provider.  Thank you for choosing me and [facility].  Pricilla Riffle. Dagoberto Ligas., MD., [facility] Regal

## 2016-10-14 LAB — GASTROINTESTINAL PATHOGEN PANEL PCR
C. DIFFICILE TOX A/B, PCR: NOT DETECTED
CAMPYLOBACTER, PCR: NOT DETECTED
Cryptosporidium, PCR: NOT DETECTED
E COLI (STEC) STX1/STX2, PCR: NOT DETECTED
E coli (ETEC) LT/ST PCR: NOT DETECTED
E coli 0157, PCR: NOT DETECTED
Giardia lamblia, PCR: NOT DETECTED
NOROVIRUS, PCR: NOT DETECTED
ROTAVIRUS, PCR: NOT DETECTED
SALMONELLA, PCR: NOT DETECTED
SHIGELLA, PCR: NOT DETECTED

## 2016-10-19 ENCOUNTER — Telehealth: Payer: Self-pay

## 2016-10-19 DIAGNOSIS — J3089 Other allergic rhinitis: Secondary | ICD-10-CM | POA: Diagnosis not present

## 2016-10-19 DIAGNOSIS — J301 Allergic rhinitis due to pollen: Secondary | ICD-10-CM | POA: Diagnosis not present

## 2016-10-19 NOTE — Telephone Encounter (Signed)
Received fax from [facility] stating [name] is very expensive and is not covered by his insurance at all. They are requesting alternative therapy. Please advise Dr. Fuller Plan.

## 2016-10-19 NOTE — Telephone Encounter (Signed)
Left a message for patient to return my call. 

## 2016-10-19 NOTE — Telephone Encounter (Signed)
He has tried several other medications without success. I suggest a limited prescription, maybe with 3-5 days of Donnatal, to see if it is effective for him. We can supply samples to him if we have them.

## 2016-10-19 NOTE — Telephone Encounter (Signed)
OK. Levsin 1-2 po q4h prn abdominal pain, bloating, 1 year of refills.

## 2016-10-19 NOTE — Telephone Encounter (Signed)
Patient returning phone call states he will be available after 3pm tpday [phone].

## 2016-10-19 NOTE — Telephone Encounter (Signed)
Patient states his pharmacy told him the medication was over $1000 dollars. Patient states the pharmacist told him an alternative such as Levsin would be covered by his insurance. Patient wants me to ask if this is an acceptable alternative. Dr. Fuller Plan, we do not have samples of Donnatal in the office to give to the patient. Please advise if you want patient to try alternative or another medication.

## 2016-10-20 LAB — PANCREATIC ELASTASE, FECAL: Pancreatic Elastase-1, Stool: >500 mcg/g

## 2016-10-20 MED ORDER — HYOSCYAMINE SULFATE 0.125 MG SL SUBL: 0.1250 mg | SUBLINGUAL_TABLET | SUBLINGUAL | 11 refills | Status: AC | PRN

## 2016-10-20 NOTE — Telephone Encounter (Signed)
Informed patient that we will send this medication to his pharmacy. Also informed him to call us with a progress report if his symptoms do not improve.

## 2016-10-26 DIAGNOSIS — J301 Allergic rhinitis due to pollen: Secondary | ICD-10-CM | POA: Diagnosis not present

## 2016-10-26 DIAGNOSIS — J3089 Other allergic rhinitis: Secondary | ICD-10-CM | POA: Diagnosis not present

## 2016-11-01 ENCOUNTER — Other Ambulatory Visit: Payer: Self-pay | Admitting: Gastroenterology

## 2016-11-02 DIAGNOSIS — J3089 Other allergic rhinitis: Secondary | ICD-10-CM | POA: Diagnosis not present

## 2016-11-02 DIAGNOSIS — J301 Allergic rhinitis due to pollen: Secondary | ICD-10-CM | POA: Diagnosis not present

## 2016-11-03 ENCOUNTER — Other Ambulatory Visit: Payer: Self-pay | Admitting: Gastroenterology

## 2016-11-05 DIAGNOSIS — H2512 Age-related nuclear cataract, left eye: Secondary | ICD-10-CM | POA: Diagnosis not present

## 2016-11-05 DIAGNOSIS — H25042 Posterior subcapsular polar age-related cataract, left eye: Secondary | ICD-10-CM | POA: Diagnosis not present

## 2016-11-05 DIAGNOSIS — H25012 Cortical age-related cataract, left eye: Secondary | ICD-10-CM | POA: Diagnosis not present

## 2016-11-05 DIAGNOSIS — H40013 Open angle with borderline findings, low risk, bilateral: Secondary | ICD-10-CM | POA: Diagnosis not present

## 2016-11-09 DIAGNOSIS — J301 Allergic rhinitis due to pollen: Secondary | ICD-10-CM | POA: Diagnosis not present

## 2016-11-09 DIAGNOSIS — J3089 Other allergic rhinitis: Secondary | ICD-10-CM | POA: Diagnosis not present

## 2016-11-11 DIAGNOSIS — R0609 Other forms of dyspnea: Secondary | ICD-10-CM | POA: Diagnosis not present

## 2016-11-11 DIAGNOSIS — R05 Cough: Secondary | ICD-10-CM | POA: Diagnosis not present

## 2016-11-11 DIAGNOSIS — C3412 Malignant neoplasm of upper lobe, left bronchus or lung: Secondary | ICD-10-CM | POA: Diagnosis not present

## 2016-11-11 DIAGNOSIS — Z6824 Body mass index (BMI) 24.0-24.9, adult: Secondary | ICD-10-CM | POA: Diagnosis not present

## 2016-11-11 DIAGNOSIS — R140 Abdominal distension (gaseous): Secondary | ICD-10-CM | POA: Diagnosis not present

## 2016-11-12 DIAGNOSIS — M1711 Unilateral primary osteoarthritis, right knee: Secondary | ICD-10-CM | POA: Diagnosis not present

## 2016-11-16 DIAGNOSIS — J3089 Other allergic rhinitis: Secondary | ICD-10-CM | POA: Diagnosis not present

## 2016-11-16 DIAGNOSIS — J301 Allergic rhinitis due to pollen: Secondary | ICD-10-CM | POA: Diagnosis not present

## 2016-11-25 DIAGNOSIS — G8929 Other chronic pain: Secondary | ICD-10-CM | POA: Diagnosis not present

## 2016-11-25 DIAGNOSIS — M25561 Pain in right knee: Secondary | ICD-10-CM | POA: Diagnosis not present

## 2016-11-25 DIAGNOSIS — M1711 Unilateral primary osteoarthritis, right knee: Secondary | ICD-10-CM | POA: Diagnosis not present

## 2016-11-30 DIAGNOSIS — J3089 Other allergic rhinitis: Secondary | ICD-10-CM | POA: Diagnosis not present

## 2016-11-30 DIAGNOSIS — J301 Allergic rhinitis due to pollen: Secondary | ICD-10-CM | POA: Diagnosis not present

## 2016-12-02 DIAGNOSIS — M1711 Unilateral primary osteoarthritis, right knee: Secondary | ICD-10-CM | POA: Diagnosis not present

## 2016-12-14 DIAGNOSIS — J3089 Other allergic rhinitis: Secondary | ICD-10-CM | POA: Diagnosis not present

## 2016-12-14 DIAGNOSIS — J301 Allergic rhinitis due to pollen: Secondary | ICD-10-CM | POA: Diagnosis not present

## 2016-12-17 DIAGNOSIS — H2512 Age-related nuclear cataract, left eye: Secondary | ICD-10-CM | POA: Diagnosis not present

## 2016-12-17 DIAGNOSIS — H25042 Posterior subcapsular polar age-related cataract, left eye: Secondary | ICD-10-CM | POA: Diagnosis not present

## 2016-12-17 DIAGNOSIS — H25012 Cortical age-related cataract, left eye: Secondary | ICD-10-CM | POA: Diagnosis not present

## 2016-12-17 DIAGNOSIS — H269 Unspecified cataract: Secondary | ICD-10-CM | POA: Diagnosis not present

## 2016-12-21 DIAGNOSIS — J301 Allergic rhinitis due to pollen: Secondary | ICD-10-CM | POA: Diagnosis not present

## 2016-12-21 DIAGNOSIS — J3089 Other allergic rhinitis: Secondary | ICD-10-CM | POA: Diagnosis not present

## 2016-12-22 ENCOUNTER — Telehealth: Payer: Self-pay | Admitting: Internal Medicine

## 2016-12-22 ENCOUNTER — Other Ambulatory Visit (HOSPITAL_BASED_OUTPATIENT_CLINIC_OR_DEPARTMENT_OTHER): Payer: Medicare Other

## 2016-12-22 DIAGNOSIS — Z85118 Personal history of other malignant neoplasm of bronchus and lung: Secondary | ICD-10-CM | POA: Diagnosis present

## 2016-12-22 DIAGNOSIS — C3491 Malignant neoplasm of unspecified part of right bronchus or lung: Secondary | ICD-10-CM

## 2016-12-22 LAB — CBC WITH DIFFERENTIAL/PLATELET
BASO%: 0.9 % (ref 0.0–2.0)
BASOS ABS: 0.1 10*3/uL (ref 0.0–0.1)
EOS ABS: 0.1 10*3/uL (ref 0.0–0.5)
EOS%: 0.8 % (ref 0.0–7.0)
HEMATOCRIT: 34.2 % — AB (ref 38.4–49.9)
HGB: 11.4 g/dL — ABNORMAL LOW (ref 13.0–17.1)
LYMPH#: 1 10*3/uL (ref 0.9–3.3)
LYMPH%: 10.1 % — AB (ref 14.0–49.0)
MCH: 31.6 pg (ref 27.2–33.4)
MCHC: 33.2 g/dL (ref 32.0–36.0)
MCV: 94.9 fL (ref 79.3–98.0)
MONO#: 1.3 10*3/uL — AB (ref 0.1–0.9)
MONO%: 13.8 % (ref 0.0–14.0)
NEUT#: 7.2 10*3/uL — ABNORMAL HIGH (ref 1.5–6.5)
NEUT%: 74.4 % (ref 39.0–75.0)
PLATELETS: 174 10*3/uL (ref 140–400)
RBC: 3.6 10*6/uL — ABNORMAL LOW (ref 4.20–5.82)
RDW: 16.5 % — ABNORMAL HIGH (ref 11.0–14.6)
WBC: 9.7 10*3/uL (ref 4.0–10.3)

## 2016-12-22 LAB — COMPREHENSIVE METABOLIC PANEL
ALT: 13 U/L (ref 0–55)
ANION GAP: 7 meq/L (ref 3–11)
AST: 21 U/L (ref 5–34)
Albumin: 3.7 g/dL (ref 3.5–5.0)
Alkaline Phosphatase: 50 U/L (ref 40–150)
BUN: 22.9 mg/dL (ref 7.0–26.0)
CALCIUM: 9.3 mg/dL (ref 8.4–10.4)
CHLORIDE: 97 meq/L — AB (ref 98–109)
CO2: 28 mEq/L (ref 22–29)
CREATININE: 1.1 mg/dL (ref 0.7–1.3)
EGFR: 58 mL/min/{1.73_m2} — AB (ref >90)
Glucose: 102 mg/dl (ref 70–140)
POTASSIUM: 4.3 meq/L (ref 3.5–5.1)
Sodium: 132 mEq/L — ABNORMAL LOW (ref 136–145)
Total Bilirubin: 0.63 mg/dL (ref 0.20–1.20)
Total Protein: 6.4 g/dL (ref 6.4–8.3)

## 2016-12-22 NOTE — Telephone Encounter (Signed)
Gave patient calendar for October

## 2016-12-27 ENCOUNTER — Ambulatory Visit: Payer: Medicare Other | Admitting: Internal Medicine

## 2016-12-28 DIAGNOSIS — J3089 Other allergic rhinitis: Secondary | ICD-10-CM | POA: Diagnosis not present

## 2016-12-28 DIAGNOSIS — J301 Allergic rhinitis due to pollen: Secondary | ICD-10-CM | POA: Diagnosis not present

## 2016-12-29 ENCOUNTER — Ambulatory Visit (HOSPITAL_COMMUNITY)
Admission: RE | Admit: 2016-12-29 | Discharge: 2016-12-29 | Disposition: A | Discharge status: Home / Self Care | Payer: Medicare Other | Source: Ambulatory Visit | Attending: Internal Medicine | Admitting: Internal Medicine

## 2016-12-29 ENCOUNTER — Encounter (HOSPITAL_COMMUNITY): Payer: Self-pay

## 2016-12-29 DIAGNOSIS — J439 Emphysema, unspecified: Secondary | ICD-10-CM | POA: Diagnosis not present

## 2016-12-29 DIAGNOSIS — I700 Atherosclerosis of aorta: Secondary | ICD-10-CM | POA: Diagnosis not present

## 2016-12-29 DIAGNOSIS — J479 Bronchiectasis, uncomplicated: Secondary | ICD-10-CM | POA: Insufficient documentation

## 2016-12-29 DIAGNOSIS — J90 Pleural effusion, not elsewhere classified: Secondary | ICD-10-CM | POA: Diagnosis not present

## 2016-12-29 DIAGNOSIS — C3491 Malignant neoplasm of unspecified part of right bronchus or lung: Secondary | ICD-10-CM | POA: Diagnosis present

## 2016-12-29 DIAGNOSIS — R918 Other nonspecific abnormal finding of lung field: Secondary | ICD-10-CM | POA: Insufficient documentation

## 2016-12-29 DIAGNOSIS — Z9889 Other specified postprocedural states: Secondary | ICD-10-CM | POA: Insufficient documentation

## 2016-12-29 DIAGNOSIS — C3490 Malignant neoplasm of unspecified part of unspecified bronchus or lung: Secondary | ICD-10-CM | POA: Diagnosis not present

## 2016-12-29 MED ORDER — IOPAMIDOL (ISOVUE-300) INJECTION 61%
75.0000 mL | Freq: Once | INTRAVENOUS | Status: AC | PRN
  Administered 2016-12-29: 75 mL via INTRAVENOUS

## 2016-12-29 MED ORDER — IOPAMIDOL (ISOVUE-300) INJECTION 61%
INTRAVENOUS | Status: AC
  Administered 2016-12-29: 75 mL via INTRAVENOUS
  Filled 2016-12-29: qty 75

## 2017-01-03 ENCOUNTER — Telehealth: Payer: Self-pay

## 2017-01-03 ENCOUNTER — Encounter: Payer: Self-pay | Admitting: Internal Medicine

## 2017-01-03 ENCOUNTER — Ambulatory Visit (HOSPITAL_BASED_OUTPATIENT_CLINIC_OR_DEPARTMENT_OTHER): Payer: Medicare Other | Admitting: Internal Medicine

## 2017-01-03 VITALS — BP 128/70 | HR 88 | Temp 97.5°F | Resp 19 | Ht 68.5 in | Wt 182.9 lb

## 2017-01-03 DIAGNOSIS — C3490 Malignant neoplasm of unspecified part of unspecified bronchus or lung: Secondary | ICD-10-CM

## 2017-01-03 DIAGNOSIS — I509 Heart failure, unspecified: Secondary | ICD-10-CM | POA: Diagnosis not present

## 2017-01-03 DIAGNOSIS — J90 Pleural effusion, not elsewhere classified: Secondary | ICD-10-CM | POA: Diagnosis not present

## 2017-01-03 DIAGNOSIS — C3491 Malignant neoplasm of unspecified part of right bronchus or lung: Secondary | ICD-10-CM

## 2017-01-03 DIAGNOSIS — R5383 Other fatigue: Secondary | ICD-10-CM

## 2017-01-03 DIAGNOSIS — K769 Liver disease, unspecified: Secondary | ICD-10-CM

## 2017-01-03 DIAGNOSIS — R0602 Shortness of breath: Secondary | ICD-10-CM | POA: Diagnosis not present

## 2017-01-03 DIAGNOSIS — Z85118 Personal history of other malignant neoplasm of bronchus and lung: Secondary | ICD-10-CM

## 2017-01-03 DIAGNOSIS — R609 Edema, unspecified: Secondary | ICD-10-CM | POA: Diagnosis not present

## 2017-01-03 DIAGNOSIS — I10 Essential (primary) hypertension: Secondary | ICD-10-CM

## 2017-01-03 NOTE — Progress Notes (Signed)
[place] [phone]   [phone]) [phone]  OFFICE PROGRESS NOTE  [name], MD [place] [address]  DIAGNOSIS: Stage IB (T2a, N0, M0) non-small cell lung cancer, adenocarcinoma presented with left lower lobe superior segment lung mass diagnosed in September 2016.   PRIOR THERAPY:  Status post thermal ablation at [facility] on 01/03/2015.   CURRENT THERAPY: Observation.  INTERVAL HISTORY: Nettie Elm, MD 83 y.o. male returns to the clinic today for six-month follow-up visit accompanied by his daughter. The patient is feeling fine today with no specific complaints except for generalized fatigue. He also has a swelling of the lower extremities secondary to fluid retention. He is currently on visit by his primary care physician but he takes Lasix only on as-needed basis. He denied having any chest pain or shortness breath. He denied having any fever or chills. He has no significant weight loss or night sweats. He actually gained around 10 pounds in the last few weeks likely secondary to fluid retention.ad repeat CT scan of the chest performed recently and he is here for evaluation and discussion of his scan results.  MEDICAL HISTORY: Past Medical History:  Diagnosis Date  . Adenocarcinoma of right lung, stage 1 ([place]) 12/11/2014   LLL/pt on 04/26/2016  . Asthma   . Atrial fibrillation ([place])   . Basal cell carcinoma    "mostly face" (04/26/2016)  . Bowel obstruction ([place])   . Bradycardia   . COPD (chronic obstructive pulmonary disease) (H[facility])    mild  . Diverticulosis of colon   . DVT (deep venous thrombosis) (H[facility])    LLE  . Hemorrhoids   . Hx of adenomatous colonic polyps 02/1997  . Hyperlipidemia   . Hypertension   . 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 NSTEMI (non-ST elevated myocardial infarction) ([place]) 04/25/2016  . Osteoarthritis    "back, extremities, left knee, left hip" (04/26/2016)  .  Palpitations   . Pleural effusion 11/2014   "after needle ablation for lung cancer"  . Pneumonia 1-2 times  . Squamous carcinoma    "mostly face" (04/26/2016)  . Upper respiratory symptom 03/25/2016    ALLERGIES:  is allergic to sulfa antibiotics.  MEDICATIONS:  Current Outpatient Prescriptions  Medication Sig Dispense Refill  . albuterol (PROVENTIL HFA;VENTOLIN HFA) 108 (90 Base) MCG/ACT inhaler Inhale 1-2 puffs into the lungs every 6 (six) hours as needed for wheezing or shortness of breath.    Marland Kitchen alendronate (FOSAMAX) 70 MG tablet Take 1 tablet by mouth once a week.    Marland Kitchen CALCIUM PO Take 1,000 mg by mouth daily.    . cetirizine (ZYRTEC) 10 MG tablet Take 10 mg by mouth every evening.     . clotrimazole-betamethasone (LOTRISONE) cream     . dicyclomine (BENTYL) 10 MG capsule Take 1 capsule (10 mg total) by mouth 4 (four) times daily -  before meals and at bedtime. 120 capsule 5  . finasteride (PROSCAR) 5 MG tablet Take 5 mg by mouth daily.     . fluticasone (FLONASE) 50 MCG/ACT nasal spray Place 2 sprays into both nostrils every morning.    . Fluticasone-Salmeterol (ADVAIR) 250-50 MCG/DOSE AEPB Inhale 1 puff into the lungs 2 (two) times daily.    . furosemide (LASIX) 20 MG tablet Take 1 tablet by mouth as needed.    . hyoscyamine (LEVSIN SL) 0.125 MG SL tablet Place 1 tablet (0.125 mg total) under the tongue  every 4 (four) hours as needed. 100 tablet 11  . montelukast (SINGULAIR) 10 MG tablet Take 10 mg by mouth at bedtime.    Marland Kitchen omeprazole (PRILOSEC) 20 MG capsule Take 1 capsule (20 mg total) by mouth daily. 30 capsule 11  . predniSONE (DELTASONE) 10 MG tablet Take 10 mg by mouth daily.    . temazepam (RESTORIL) 15 MG capsule Take 15-30 mg by mouth at bedtime.     . Tiotropium Bromide Monohydrate (SPIRIVA RESPIMAT) 2.5 MCG/ACT AERS Inhale 2 puffs into the lungs daily.    . verapamil (VERELAN PM) 180 MG 24 hr capsule Take 180 mg by mouth at bedtime.    Marland Kitchen zolpidem (AMBIEN) 10 MG tablet  Take 10 mg by mouth at bedtime as needed for sleep.    Marland Kitchen HYDROcodone-acetaminophen (NORCO/VICODIN) 5-325 MG tablet Take 1-2 tablets by mouth every 4 (four) hours as needed for moderate pain. (Patient not taking: Reported on 01/03/2017) 30 tablet 0   No current facility-administered medications for this visit.     SURGICAL HISTORY:  Past Surgical History:  Procedure Laterality Date  . APPENDECTOMY    . BACK SURGERY    . CARDIAC CATHETERIZATION  04/26/2016  . CARDIAC CATHETERIZATION N/A 04/26/2016   Procedure: Coronary/Graft Angiography;  Surgeon: Lorretta Harp, MD;  Location: [facility];  Service: Cardiovascular;  Laterality: N/A;  . CATARACT EXTRACTION W/ INTRAOCULAR LENS IMPLANT Right   . COLECTOMY     "large colon for diverticulitis"  . CYSTOSCOPY     "I was having prostate/bladder trouble"  . INGUINAL HERNIA REPAIR Bilateral   . JOINT REPLACEMENT    . LUMBAR LAMINECTOMY Right     L4-L5   . SHOULDER OPEN ROTATOR CUFF REPAIR Right   . TONSILLECTOMY    . TOTAL HIP ARTHROPLASTY Right   . UMBILICAL HERNIA REPAIR    . VIDEO BRONCHOSCOPY WITH ENDOBRONCHIAL NAVIGATION N/A 12/18/2014   Procedure: VIDEO BRONCHOSCOPY WITH ENDOBRONCHIAL NAVIGATION ;  Surgeon: Collene Gobble, MD;  Location: [facility];  Service: Thoracic;  Laterality: N/A;    REVIEW OF SYSTEMS:  A comprehensive review of systems was negative except for: Constitutional: positive for fatigue Respiratory: positive for cough and dyspnea on exertion   PHYSICAL EXAMINATION: General appearance: alert, cooperative, fatigued and no distress Head: Normocephalic, without obvious abnormality, atraumatic Neck: no adenopathy, no JVD, supple, symmetrical, trachea midline and thyroid not enlarged, symmetric, no tenderness/mass/nodules Lymph nodes: Cervical, supraclavicular, and axillary nodes normal. Resp: clear to auscultation bilaterally Back: symmetric, no curvature. ROM normal. No CVA tenderness. Cardio: regular rate and rhythm,  S1, S2 normal, no murmur, click, rub or gallop GI: soft, non-tender; bowel sounds normal; no masses,  no organomegaly Extremities: extremities normal, atraumatic, no cyanosis or edema  ECOG PERFORMANCE STATUS: 1 - Symptomatic but completely ambulatory  Blood pressure 128/70, pulse 88, temperature (!) 97.5 F (36.4 C), temperature source Oral, resp. rate 19, height 5' 8.5" (1.74 m), weight 182 lb 14.4 oz (83 kg), SpO2 96 %.  LABORATORY DATA: Lab Results  Component Value Date   WBC 9.7 12/22/2016   HGB 11.4 (L) 12/22/2016   HCT 34.2 (L) 12/22/2016   MCV 94.9 12/22/2016   PLT 174 12/22/2016      Chemistry      Component Value Date/Time   NA 132 (L) 12/22/2016 1045   K 4.3 12/22/2016 1045   CL 96 06/18/2016 0832   CO2 28 12/22/2016 1045   BUN 22.9 12/22/2016 1045   CREATININE 1.1 12/22/2016  1045      Component Value Date/Time   CALCIUM 9.3 12/22/2016 1045   ALKPHOS 50 12/22/2016 1045   AST 21 12/22/2016 1045   ALT 13 12/22/2016 1045   BILITOT 0.63 12/22/2016 1045       RADIOGRAPHIC STUDIES: Ct Chest W Contrast  Result Date: 12/29/2016 CLINICAL DATA:  Non-small-cell (adenocarcinoma) diagnosis in the left lower lobe in September 2016. Post thermal ablation therapy. EXAM: CT CHEST WITH CONTRAST TECHNIQUE: Multidetector CT imaging of the chest was performed during intravenous contrast administration. CONTRAST:  75 ml Isovue-300. COMPARISON:  Chest CT 06/23/2016.  PET-CT 04/15/2015 and 10/13/2015. FINDINGS: Cardiovascular: Diffuse atherosclerosis of aorta, great vessels and coronary arteries. There is suboptimal opacification of the pulmonary arteries, although no acute vascular findings are seen. Stable cardiomegaly. No significant pericardial effusion. There are calcifications of aortic valve. Mediastinum/Nodes: There are no enlarged mediastinal, hilar or axillary lymph nodes.There are small calcified right hilar and subcarinal lymph nodes. The thyroid gland, trachea and esophagus  demonstrate no significant findings. Lungs/Pleura: A small dependent right pleural effusion has enlarged. A small partially loculated left pleural effusion has not significantly changed. Treated tumor in the superior segment of the left lower lobe appears stable with a wedge-shaped area of opacity measuring 3.7 x 2.3 cm on image 55. Emphysema and bronchiectasis (greatest in the right lung) are again noted with associated architectural distortion. There is slightly increased inflammation peripheral to the bronchiectasis in the right upper lobe. No new or enlarging pulmonary nodules. Upper abdomen: The visualized upper abdomen appears stable without suspicious findings. There are scattered calcified granulomas throughout the liver, spleen and upper abdominal lymph nodes. Musculoskeletal/Chest wall: There is no chest wall mass or suspicious osseous finding. Superior endplate compression deformity at L1 is grossly stable. IMPRESSION: 1. Stable appearance of treated tumor in the superior segment of the left lower lobe. 2. No evidence of local recurrence or metastatic disease. 3. Small right pleural effusion has enlarged without associated pleural based nodularity. Small left pleural effusion is unchanged. 4. Stable chronic lung disease with emphysema and bronchiectasis. There is slightly increased peribronchial inflammation in the right upper lobe. Sequela of prior granulomatous disease. 5.  Aortic Atherosclerosis (ICD10-I70.0). Electronically Signed   By: Richardean Sale M.D.   On: 12/29/2016 14:23    ASSESSMENT AND PLAN:  This is a very pleasant 83 years old white male with history of a stage IB non-small cell lung cancer, adenocarcinoma diagnosed in September 2016 is status post several ablation of the tumor at [facility] in October 2016 and has been observation since that time. The patient is feeling fine today except for the fatigue and shortness of breath likely secondary to fluid retention from  congestive heart failure. His CT scan of the chest showed no concerning findings for disease recurrence and the patient has unchanged small left pleural effusion as well as small right pleural effusion. I discussed the scan results with the patient and his daughter. I recommended for him to continue on observation with repeat CT scan of the chest in one year. I advised the patient to discuss with his primary care physician or cardiologist adjustment of his Lasix dose for the fluid retention. He was advised to call immediately if he has any other concerning symptoms in the interval. The patient voices understanding of current disease status and treatment options and is in agreement with the current care plan.  All questions were answered. The patient knows to call the clinic with any problems, questions  or concerns. We can certainly see the patient much sooner if necessary. I spent 10 minutes counseling the patient face to face. The total time spent in the appointment was 15 minutes.  Disclaimer: This note was dictated with voice recognition software. Similar sounding words can inadvertently be transcribed and may not be corrected upon review.

## 2017-01-03 NOTE — Telephone Encounter (Signed)
MRI scheduled with his assistant for 01/19/17 9:45 am arrival.  She is notified to have him enter [address] entrance and by NPO for 4 hours prior.

## 2017-01-03 NOTE — Telephone Encounter (Signed)
-----   Message from Marlon Pel, RN sent at 07/01/2016  3:53 PM EDT ----- Needs MRI- see results 07/01/16- stark

## 2017-01-04 ENCOUNTER — Telehealth: Payer: Self-pay | Admitting: Internal Medicine

## 2017-01-04 DIAGNOSIS — J301 Allergic rhinitis due to pollen: Secondary | ICD-10-CM | POA: Diagnosis not present

## 2017-01-04 DIAGNOSIS — J3089 Other allergic rhinitis: Secondary | ICD-10-CM | POA: Diagnosis not present

## 2017-01-04 NOTE — Telephone Encounter (Signed)
Spoke with patients assistant regarding his schedule per 10/9 los.

## 2017-01-05 DIAGNOSIS — R140 Abdominal distension (gaseous): Secondary | ICD-10-CM | POA: Diagnosis not present

## 2017-01-05 DIAGNOSIS — Z6825 Body mass index (BMI) 25.0-25.9, adult: Secondary | ICD-10-CM | POA: Diagnosis not present

## 2017-01-05 DIAGNOSIS — R609 Edema, unspecified: Secondary | ICD-10-CM | POA: Diagnosis not present

## 2017-01-05 DIAGNOSIS — C3412 Malignant neoplasm of upper lobe, left bronchus or lung: Secondary | ICD-10-CM | POA: Diagnosis not present

## 2017-01-05 DIAGNOSIS — R0600 Dyspnea, unspecified: Secondary | ICD-10-CM | POA: Diagnosis not present

## 2017-01-05 DIAGNOSIS — Z23 Encounter for immunization: Secondary | ICD-10-CM | POA: Diagnosis not present

## 2017-01-07 ENCOUNTER — Other Ambulatory Visit: Payer: Self-pay | Admitting: Internal Medicine

## 2017-01-07 DIAGNOSIS — R0600 Dyspnea, unspecified: Secondary | ICD-10-CM

## 2017-01-11 DIAGNOSIS — J301 Allergic rhinitis due to pollen: Secondary | ICD-10-CM | POA: Diagnosis not present

## 2017-01-11 DIAGNOSIS — J3089 Other allergic rhinitis: Secondary | ICD-10-CM | POA: Diagnosis not present

## 2017-01-12 ENCOUNTER — Ambulatory Visit (HOSPITAL_COMMUNITY): Payer: Medicare Other | Attending: Cardiovascular Disease

## 2017-01-12 ENCOUNTER — Other Ambulatory Visit: Payer: Self-pay

## 2017-01-12 DIAGNOSIS — Z87891 Personal history of nicotine dependence: Secondary | ICD-10-CM | POA: Diagnosis not present

## 2017-01-12 DIAGNOSIS — J449 Chronic obstructive pulmonary disease, unspecified: Secondary | ICD-10-CM | POA: Insufficient documentation

## 2017-01-12 DIAGNOSIS — Z85118 Personal history of other malignant neoplasm of bronchus and lung: Secondary | ICD-10-CM | POA: Diagnosis not present

## 2017-01-12 DIAGNOSIS — R002 Palpitations: Secondary | ICD-10-CM | POA: Diagnosis not present

## 2017-01-12 DIAGNOSIS — R0600 Dyspnea, unspecified: Secondary | ICD-10-CM | POA: Insufficient documentation

## 2017-01-12 DIAGNOSIS — I083 Combined rheumatic disorders of mitral, aortic and tricuspid valves: Secondary | ICD-10-CM | POA: Diagnosis not present

## 2017-01-12 DIAGNOSIS — I252 Old myocardial infarction: Secondary | ICD-10-CM | POA: Diagnosis not present

## 2017-01-12 DIAGNOSIS — I119 Hypertensive heart disease without heart failure: Secondary | ICD-10-CM | POA: Insufficient documentation

## 2017-01-12 DIAGNOSIS — I4891 Unspecified atrial fibrillation: Secondary | ICD-10-CM | POA: Diagnosis not present

## 2017-01-12 DIAGNOSIS — R609 Edema, unspecified: Secondary | ICD-10-CM | POA: Diagnosis not present

## 2017-01-12 DIAGNOSIS — E785 Hyperlipidemia, unspecified: Secondary | ICD-10-CM | POA: Insufficient documentation

## 2017-01-18 ENCOUNTER — Telehealth: Payer: Self-pay | Admitting: Internal Medicine

## 2017-01-18 DIAGNOSIS — J301 Allergic rhinitis due to pollen: Secondary | ICD-10-CM | POA: Diagnosis not present

## 2017-01-18 DIAGNOSIS — J3089 Other allergic rhinitis: Secondary | ICD-10-CM | POA: Diagnosis not present

## 2017-01-18 NOTE — Telephone Encounter (Signed)
New message    Pt is calling for his echo results.

## 2017-01-18 NOTE — Telephone Encounter (Signed)
Left a message for the pt to call back, and ask to speak with a triage Nurse.

## 2017-01-19 ENCOUNTER — Telehealth: Payer: Self-pay | Admitting: Internal Medicine

## 2017-01-19 ENCOUNTER — Telehealth: Payer: Self-pay

## 2017-01-19 ENCOUNTER — Ambulatory Visit (HOSPITAL_COMMUNITY)
Admission: RE | Admit: 2017-01-19 | Discharge: 2017-01-19 | Disposition: A | Discharge status: Home / Self Care | Payer: Medicare Other | Source: Ambulatory Visit | Attending: Gastroenterology | Admitting: Gastroenterology

## 2017-01-19 DIAGNOSIS — K8020 Calculus of gallbladder without cholecystitis without obstruction: Secondary | ICD-10-CM | POA: Insufficient documentation

## 2017-01-19 DIAGNOSIS — J90 Pleural effusion, not elsewhere classified: Secondary | ICD-10-CM | POA: Diagnosis not present

## 2017-01-19 DIAGNOSIS — K769 Liver disease, unspecified: Secondary | ICD-10-CM | POA: Diagnosis not present

## 2017-01-19 DIAGNOSIS — N281 Cyst of kidney, acquired: Secondary | ICD-10-CM | POA: Diagnosis not present

## 2017-01-19 MED ORDER — GADOBENATE DIMEGLUMINE 529 MG/ML IV SOLN
20.0000 mL | Freq: Once | INTRAVENOUS | Status: AC | PRN
  Administered 2017-01-19: 20 mL via INTRAVENOUS

## 2017-01-19 NOTE — Telephone Encounter (Signed)
New message     Per nurse:  Pt is going for a MRI this morning they will not be home until after 12p please call between 12p and 2p, pt is forgetting things , please call the nurse number so that they can put it on speaker phone so you can talk to both the nurse and the patient.

## 2017-01-19 NOTE — Telephone Encounter (Signed)
duplicate

## 2017-01-19 NOTE — Telephone Encounter (Signed)
Call back received from [facility] with Dr. Brigitte Pulse.  Per Claiborne Billings she has results-she was out of office yesterday.  Kelly to follow up with family.

## 2017-01-19 NOTE — Telephone Encounter (Signed)
Call placed to daughter.  Per review of Pt chart, Dr. Brigitte Pulse ordered ECHO for Pt.  Per daughter, Raul Del office is saying they never received results and it's been a week.  Call placed to Dr. Raul Del office, left message for his nurse Claiborne Billings.  Left message asking her to call back if she needs assistance getting results.

## 2017-01-19 NOTE — Telephone Encounter (Signed)
New message    [name] assistant is calling asking for a call back. She said she called earlier and asked between 64 and 2 but pt wants to lay down because he's had a hard morning.

## 2017-01-19 NOTE — Telephone Encounter (Signed)
Returned call.  Notified that Dr. Brigitte Pulse ordered ECHO and has results.  Please call Dr. Raul Del office.  Pt assistant Joelene Millin indicates understanding.

## 2017-01-20 DIAGNOSIS — Z6825 Body mass index (BMI) 25.0-25.9, adult: Secondary | ICD-10-CM | POA: Diagnosis not present

## 2017-01-20 DIAGNOSIS — R0600 Dyspnea, unspecified: Secondary | ICD-10-CM | POA: Diagnosis not present

## 2017-01-20 DIAGNOSIS — J90 Pleural effusion, not elsewhere classified: Secondary | ICD-10-CM | POA: Diagnosis not present

## 2017-01-20 DIAGNOSIS — I2789 Other specified pulmonary heart diseases: Secondary | ICD-10-CM | POA: Diagnosis not present

## 2017-01-20 DIAGNOSIS — R0609 Other forms of dyspnea: Secondary | ICD-10-CM | POA: Diagnosis not present

## 2017-01-25 DIAGNOSIS — J301 Allergic rhinitis due to pollen: Secondary | ICD-10-CM | POA: Diagnosis not present

## 2017-01-25 DIAGNOSIS — J3089 Other allergic rhinitis: Secondary | ICD-10-CM | POA: Diagnosis not present

## 2017-01-28 DIAGNOSIS — J3089 Other allergic rhinitis: Secondary | ICD-10-CM | POA: Diagnosis not present

## 2017-01-28 DIAGNOSIS — J301 Allergic rhinitis due to pollen: Secondary | ICD-10-CM | POA: Diagnosis not present

## 2017-02-01 DIAGNOSIS — J3089 Other allergic rhinitis: Secondary | ICD-10-CM | POA: Diagnosis not present

## 2017-02-01 DIAGNOSIS — J301 Allergic rhinitis due to pollen: Secondary | ICD-10-CM | POA: Diagnosis not present

## 2017-02-02 DIAGNOSIS — H25812 Combined forms of age-related cataract, left eye: Secondary | ICD-10-CM | POA: Diagnosis not present

## 2017-02-02 DIAGNOSIS — H2512 Age-related nuclear cataract, left eye: Secondary | ICD-10-CM | POA: Diagnosis not present

## 2017-02-02 DIAGNOSIS — H409 Unspecified glaucoma: Secondary | ICD-10-CM | POA: Diagnosis not present

## 2017-02-08 DIAGNOSIS — J301 Allergic rhinitis due to pollen: Secondary | ICD-10-CM | POA: Diagnosis not present

## 2017-02-08 DIAGNOSIS — J3089 Other allergic rhinitis: Secondary | ICD-10-CM | POA: Diagnosis not present

## 2017-02-11 DIAGNOSIS — Z6822 Body mass index (BMI) 22.0-22.9, adult: Secondary | ICD-10-CM | POA: Diagnosis not present

## 2017-02-11 DIAGNOSIS — I509 Heart failure, unspecified: Secondary | ICD-10-CM | POA: Diagnosis not present

## 2017-02-11 DIAGNOSIS — R0689 Other abnormalities of breathing: Secondary | ICD-10-CM | POA: Diagnosis not present

## 2017-02-11 DIAGNOSIS — R7301 Impaired fasting glucose: Secondary | ICD-10-CM | POA: Diagnosis not present

## 2017-02-15 DIAGNOSIS — J301 Allergic rhinitis due to pollen: Secondary | ICD-10-CM | POA: Diagnosis not present

## 2017-02-15 DIAGNOSIS — J3089 Other allergic rhinitis: Secondary | ICD-10-CM | POA: Diagnosis not present

## 2017-02-22 DIAGNOSIS — M1711 Unilateral primary osteoarthritis, right knee: Secondary | ICD-10-CM | POA: Diagnosis not present

## 2017-02-22 DIAGNOSIS — J3089 Other allergic rhinitis: Secondary | ICD-10-CM | POA: Diagnosis not present

## 2017-02-22 DIAGNOSIS — J301 Allergic rhinitis due to pollen: Secondary | ICD-10-CM | POA: Diagnosis not present

## 2017-03-01 DIAGNOSIS — J3089 Other allergic rhinitis: Secondary | ICD-10-CM | POA: Diagnosis not present

## 2017-03-01 DIAGNOSIS — J301 Allergic rhinitis due to pollen: Secondary | ICD-10-CM | POA: Diagnosis not present

## 2017-03-15 DIAGNOSIS — J3089 Other allergic rhinitis: Secondary | ICD-10-CM | POA: Diagnosis not present

## 2017-03-15 DIAGNOSIS — J301 Allergic rhinitis due to pollen: Secondary | ICD-10-CM | POA: Diagnosis not present

## 2017-04-04 DIAGNOSIS — L821 Other seborrheic keratosis: Secondary | ICD-10-CM | POA: Diagnosis not present

## 2017-04-04 DIAGNOSIS — L578 Other skin changes due to chronic exposure to nonionizing radiation: Secondary | ICD-10-CM | POA: Diagnosis not present

## 2017-04-04 DIAGNOSIS — L814 Other melanin hyperpigmentation: Secondary | ICD-10-CM | POA: Diagnosis not present

## 2017-04-04 DIAGNOSIS — D692 Other nonthrombocytopenic purpura: Secondary | ICD-10-CM | POA: Diagnosis not present

## 2017-04-04 DIAGNOSIS — Z85828 Personal history of other malignant neoplasm of skin: Secondary | ICD-10-CM | POA: Diagnosis not present

## 2017-04-04 DIAGNOSIS — L570 Actinic keratosis: Secondary | ICD-10-CM | POA: Diagnosis not present

## 2017-04-04 DIAGNOSIS — D1801 Hemangioma of skin and subcutaneous tissue: Secondary | ICD-10-CM | POA: Diagnosis not present

## 2017-04-05 DIAGNOSIS — J301 Allergic rhinitis due to pollen: Secondary | ICD-10-CM | POA: Diagnosis not present

## 2017-04-05 DIAGNOSIS — I480 Paroxysmal atrial fibrillation: Secondary | ICD-10-CM | POA: Diagnosis not present

## 2017-04-05 DIAGNOSIS — Z6824 Body mass index (BMI) 24.0-24.9, adult: Secondary | ICD-10-CM | POA: Diagnosis not present

## 2017-04-05 DIAGNOSIS — C3412 Malignant neoplasm of upper lobe, left bronchus or lung: Secondary | ICD-10-CM | POA: Diagnosis not present

## 2017-04-05 DIAGNOSIS — R05 Cough: Secondary | ICD-10-CM | POA: Diagnosis not present

## 2017-04-05 DIAGNOSIS — I509 Heart failure, unspecified: Secondary | ICD-10-CM | POA: Diagnosis not present

## 2017-04-05 DIAGNOSIS — J3089 Other allergic rhinitis: Secondary | ICD-10-CM | POA: Diagnosis not present

## 2017-04-19 DIAGNOSIS — J301 Allergic rhinitis due to pollen: Secondary | ICD-10-CM | POA: Diagnosis not present

## 2017-04-19 DIAGNOSIS — J3089 Other allergic rhinitis: Secondary | ICD-10-CM | POA: Diagnosis not present

## 2017-04-26 DIAGNOSIS — J301 Allergic rhinitis due to pollen: Secondary | ICD-10-CM | POA: Diagnosis not present

## 2017-04-26 DIAGNOSIS — J3089 Other allergic rhinitis: Secondary | ICD-10-CM | POA: Diagnosis not present

## 2017-05-02 DIAGNOSIS — Z6824 Body mass index (BMI) 24.0-24.9, adult: Secondary | ICD-10-CM | POA: Diagnosis not present

## 2017-05-02 DIAGNOSIS — I509 Heart failure, unspecified: Secondary | ICD-10-CM | POA: Diagnosis not present

## 2017-05-02 DIAGNOSIS — R05 Cough: Secondary | ICD-10-CM | POA: Diagnosis not present

## 2017-05-05 ENCOUNTER — Emergency Department (HOSPITAL_COMMUNITY): Payer: Medicare Other

## 2017-05-05 ENCOUNTER — Inpatient Hospital Stay (HOSPITAL_COMMUNITY)
Admission: EM | Admit: 2017-05-05 | Discharge: 2017-05-06 | DRG: 189 | Disposition: A | Discharge status: Hospice | Payer: Medicare Other | Attending: Family Medicine | Admitting: Family Medicine

## 2017-05-05 ENCOUNTER — Other Ambulatory Visit: Payer: Self-pay

## 2017-05-05 DIAGNOSIS — I25119 Atherosclerotic heart disease of native coronary artery with unspecified angina pectoris: Secondary | ICD-10-CM | POA: Diagnosis not present

## 2017-05-05 DIAGNOSIS — Z87891 Personal history of nicotine dependence: Secondary | ICD-10-CM | POA: Diagnosis not present

## 2017-05-05 DIAGNOSIS — J301 Allergic rhinitis due to pollen: Secondary | ICD-10-CM | POA: Diagnosis not present

## 2017-05-05 DIAGNOSIS — Z85828 Personal history of other malignant neoplasm of skin: Secondary | ICD-10-CM | POA: Diagnosis not present

## 2017-05-05 DIAGNOSIS — Z961 Presence of intraocular lens: Secondary | ICD-10-CM | POA: Diagnosis present

## 2017-05-05 DIAGNOSIS — Z86718 Personal history of other venous thrombosis and embolism: Secondary | ICD-10-CM

## 2017-05-05 DIAGNOSIS — E785 Hyperlipidemia, unspecified: Secondary | ICD-10-CM | POA: Diagnosis present

## 2017-05-05 DIAGNOSIS — Z79899 Other long term (current) drug therapy: Secondary | ICD-10-CM

## 2017-05-05 DIAGNOSIS — Z7952 Long term (current) use of systemic steroids: Secondary | ICD-10-CM | POA: Diagnosis not present

## 2017-05-05 DIAGNOSIS — R0600 Dyspnea, unspecified: Secondary | ICD-10-CM | POA: Diagnosis not present

## 2017-05-05 DIAGNOSIS — R748 Abnormal levels of other serum enzymes: Secondary | ICD-10-CM | POA: Diagnosis present

## 2017-05-05 DIAGNOSIS — I110 Hypertensive heart disease with heart failure: Secondary | ICD-10-CM | POA: Diagnosis present

## 2017-05-05 DIAGNOSIS — I82409 Acute embolism and thrombosis of unspecified deep veins of unspecified lower extremity: Secondary | ICD-10-CM | POA: Diagnosis not present

## 2017-05-05 DIAGNOSIS — Z8679 Personal history of other diseases of the circulatory system: Secondary | ICD-10-CM | POA: Diagnosis not present

## 2017-05-05 DIAGNOSIS — I252 Old myocardial infarction: Secondary | ICD-10-CM | POA: Diagnosis not present

## 2017-05-05 DIAGNOSIS — K219 Gastro-esophageal reflux disease without esophagitis: Secondary | ICD-10-CM | POA: Diagnosis present

## 2017-05-05 DIAGNOSIS — C3491 Malignant neoplasm of unspecified part of right bronchus or lung: Secondary | ICD-10-CM | POA: Diagnosis present

## 2017-05-05 DIAGNOSIS — Z86010 Personal history of colonic polyps: Secondary | ICD-10-CM

## 2017-05-05 DIAGNOSIS — Z66 Do not resuscitate: Secondary | ICD-10-CM | POA: Diagnosis present

## 2017-05-05 DIAGNOSIS — I509 Heart failure, unspecified: Secondary | ICD-10-CM | POA: Diagnosis not present

## 2017-05-05 DIAGNOSIS — I4581 Long QT syndrome: Secondary | ICD-10-CM | POA: Diagnosis not present

## 2017-05-05 DIAGNOSIS — Z7951 Long term (current) use of inhaled steroids: Secondary | ICD-10-CM | POA: Diagnosis not present

## 2017-05-05 DIAGNOSIS — C3490 Malignant neoplasm of unspecified part of unspecified bronchus or lung: Secondary | ICD-10-CM | POA: Diagnosis not present

## 2017-05-05 DIAGNOSIS — I4891 Unspecified atrial fibrillation: Secondary | ICD-10-CM | POA: Diagnosis present

## 2017-05-05 DIAGNOSIS — Z801 Family history of malignant neoplasm of trachea, bronchus and lung: Secondary | ICD-10-CM | POA: Diagnosis not present

## 2017-05-05 DIAGNOSIS — Z96641 Presence of right artificial hip joint: Secondary | ICD-10-CM | POA: Diagnosis present

## 2017-05-05 DIAGNOSIS — E559 Vitamin D deficiency, unspecified: Secondary | ICD-10-CM | POA: Diagnosis not present

## 2017-05-05 DIAGNOSIS — Z881 Allergy status to other antibiotic agents status: Secondary | ICD-10-CM

## 2017-05-05 DIAGNOSIS — Z9049 Acquired absence of other specified parts of digestive tract: Secondary | ICD-10-CM

## 2017-05-05 DIAGNOSIS — N401 Enlarged prostate with lower urinary tract symptoms: Secondary | ICD-10-CM | POA: Diagnosis not present

## 2017-05-05 DIAGNOSIS — R0602 Shortness of breath: Secondary | ICD-10-CM | POA: Diagnosis not present

## 2017-05-05 DIAGNOSIS — R069 Unspecified abnormalities of breathing: Secondary | ICD-10-CM | POA: Diagnosis not present

## 2017-05-05 DIAGNOSIS — J9602 Acute respiratory failure with hypercapnia: Secondary | ICD-10-CM | POA: Diagnosis not present

## 2017-05-05 DIAGNOSIS — I2510 Atherosclerotic heart disease of native coronary artery without angina pectoris: Secondary | ICD-10-CM | POA: Diagnosis present

## 2017-05-05 DIAGNOSIS — N400 Enlarged prostate without lower urinary tract symptoms: Secondary | ICD-10-CM | POA: Diagnosis present

## 2017-05-05 DIAGNOSIS — J449 Chronic obstructive pulmonary disease, unspecified: Secondary | ICD-10-CM | POA: Diagnosis present

## 2017-05-05 DIAGNOSIS — J45901 Unspecified asthma with (acute) exacerbation: Secondary | ICD-10-CM | POA: Diagnosis not present

## 2017-05-05 DIAGNOSIS — I10 Essential (primary) hypertension: Secondary | ICD-10-CM | POA: Diagnosis not present

## 2017-05-05 LAB — CBC WITH DIFFERENTIAL/PLATELET
BASOS ABS: 0 10*3/uL (ref 0.0–0.1)
BASOS PCT: 0 %
EOS ABS: 0.1 10*3/uL (ref 0.0–0.7)
Eosinophils Relative: 1 %
HEMATOCRIT: 36 % — AB (ref 39.0–52.0)
HEMOGLOBIN: 11.4 g/dL — AB (ref 13.0–17.0)
Lymphocytes Relative: 19 %
Lymphs Abs: 1.3 10*3/uL (ref 0.7–4.0)
MCH: 31.7 pg (ref 26.0–34.0)
MCHC: 31.7 g/dL (ref 30.0–36.0)
MCV: 100 fL (ref 78.0–100.0)
MONO ABS: 0.6 10*3/uL (ref 0.1–1.0)
Monocytes Relative: 9 %
NEUTROS PCT: 71 %
Neutro Abs: 4.9 10*3/uL (ref 1.7–7.7)
Platelets: 144 10*3/uL — ABNORMAL LOW (ref 150–400)
RBC: 3.6 MIL/uL — ABNORMAL LOW (ref 4.22–5.81)
RDW: 17.3 % — AB (ref 11.5–15.5)
WBC: 6.9 10*3/uL (ref 4.0–10.5)

## 2017-05-05 LAB — TROPONIN I
TROPONIN I: 0.11 ng/mL — AB (ref <0.03)
Troponin I: 0.11 ng/mL (ref <0.03)

## 2017-05-05 LAB — I-STAT ARTERIAL BLOOD GAS, ED
ACID-BASE EXCESS: 8 mmol/L — AB (ref 0.0–2.0)
Bicarbonate: 37.5 mmol/L — ABNORMAL HIGH (ref 20.0–28.0)
O2 Saturation: 100 %
PH ART: 7.29 — AB (ref 7.350–7.450)
TCO2: 40 mmol/L — ABNORMAL HIGH (ref 22–32)
pCO2 arterial: 78.1 mmHg (ref 32.0–48.0)
pO2, Arterial: 395 mmHg — ABNORMAL HIGH (ref 83.0–108.0)

## 2017-05-05 LAB — COMPREHENSIVE METABOLIC PANEL
ALK PHOS: 54 U/L (ref 38–126)
ALT: 14 U/L — ABNORMAL LOW (ref 17–63)
ANION GAP: 13 (ref 5–15)
AST: 25 U/L (ref 15–41)
Albumin: 3.3 g/dL — ABNORMAL LOW (ref 3.5–5.0)
BILIRUBIN TOTAL: 0.7 mg/dL (ref 0.3–1.2)
BUN: 30 mg/dL — ABNORMAL HIGH (ref 6–20)
CALCIUM: 9.6 mg/dL (ref 8.9–10.3)
CO2: 30 mmol/L (ref 22–32)
Chloride: 92 mmol/L — ABNORMAL LOW (ref 101–111)
Creatinine, Ser: 1.39 mg/dL — ABNORMAL HIGH (ref 0.61–1.24)
GFR calc non Af Amer: 43 mL/min — ABNORMAL LOW (ref >60)
GFR, EST AFRICAN AMERICAN: 50 mL/min — AB (ref >60)
Glucose, Bld: 107 mg/dL — ABNORMAL HIGH (ref 65–99)
Potassium: 3.5 mmol/L (ref 3.5–5.1)
Sodium: 135 mmol/L (ref 135–145)
TOTAL PROTEIN: 6.4 g/dL — AB (ref 6.5–8.1)

## 2017-05-05 LAB — BRAIN NATRIURETIC PEPTIDE: B NATRIURETIC PEPTIDE 5: 635.2 pg/mL — AB (ref 0.0–100.0)

## 2017-05-05 LAB — I-STAT CG4 LACTIC ACID, ED: Lactic Acid, Venous: 0.79 mmol/L (ref 0.5–1.9)

## 2017-05-05 MED ORDER — ONDANSETRON HCL 4 MG/2ML IJ SOLN: 4.0000 mg | Freq: Four times a day (QID) | INTRAMUSCULAR | Status: DC | PRN

## 2017-05-05 MED ORDER — FINASTERIDE 5 MG PO TABS
5.0000 mg | ORAL_TABLET | Freq: Every day | ORAL | Status: DC
  Administered 2017-05-06: 5 mg via ORAL
  Filled 2017-05-05: qty 1

## 2017-05-05 MED ORDER — IPRATROPIUM-ALBUTEROL 0.5-2.5 (3) MG/3ML IN SOLN
3.0000 mL | Freq: Four times a day (QID) | RESPIRATORY_TRACT | Status: DC
  Administered 2017-05-05 – 2017-05-06 (×3): 3 mL via RESPIRATORY_TRACT
  Filled 2017-05-05 (×3): qty 3

## 2017-05-05 MED ORDER — MORPHINE SULFATE (PF) 4 MG/ML IV SOLN: 2.0000 mg | INTRAVENOUS | Status: DC | PRN

## 2017-05-05 MED ORDER — ONDANSETRON HCL 4 MG PO TABS: 4.0000 mg | ORAL_TABLET | Freq: Four times a day (QID) | ORAL | Status: DC | PRN

## 2017-05-05 MED ORDER — FUROSEMIDE 10 MG/ML IJ SOLN
20.0000 mg | Freq: Every day | INTRAMUSCULAR | Status: DC
  Administered 2017-05-06: 20 mg via INTRAVENOUS
  Filled 2017-05-05: qty 2

## 2017-05-05 MED ORDER — ACETAMINOPHEN 650 MG RE SUPP: 650.0000 mg | Freq: Four times a day (QID) | RECTAL | Status: DC | PRN

## 2017-05-05 MED ORDER — CEFUROXIME AXETIL 500 MG PO TABS
500.0000 mg | ORAL_TABLET | Freq: Two times a day (BID) | ORAL | Status: DC
  Administered 2017-05-05 – 2017-05-06 (×2): 500 mg via ORAL
  Filled 2017-05-05 (×3): qty 1

## 2017-05-05 MED ORDER — LORAZEPAM 2 MG/ML IJ SOLN
1.0000 mg | INTRAMUSCULAR | Status: DC | PRN
  Administered 2017-05-06: 1 mg via INTRAMUSCULAR
  Filled 2017-05-05: qty 1

## 2017-05-05 MED ORDER — METHYLPREDNISOLONE SODIUM SUCC 40 MG IJ SOLR
40.0000 mg | Freq: Four times a day (QID) | INTRAMUSCULAR | Status: DC
  Administered 2017-05-05 – 2017-05-06 (×4): 40 mg via INTRAVENOUS
  Filled 2017-05-05 (×4): qty 1

## 2017-05-05 MED ORDER — LORAZEPAM 1 MG PO TABS
1.0000 mg | ORAL_TABLET | ORAL | Status: DC | PRN
  Administered 2017-05-05: 1 mg via ORAL
  Filled 2017-05-05: qty 1

## 2017-05-05 MED ORDER — FUROSEMIDE 10 MG/ML IJ SOLN
20.0000 mg | Freq: Once | INTRAMUSCULAR | Status: AC
Start: 2017-05-05 — End: 2017-05-05
  Administered 2017-05-05: 20 mg via INTRAVENOUS
  Filled 2017-05-05: qty 2

## 2017-05-05 MED ORDER — MONTELUKAST SODIUM 10 MG PO TABS
10.0000 mg | ORAL_TABLET | Freq: Every day | ORAL | Status: DC
  Administered 2017-05-05: 10 mg via ORAL
  Filled 2017-05-05 (×2): qty 1

## 2017-05-05 MED ORDER — MORPHINE SULFATE (CONCENTRATE) 10 MG/0.5ML PO SOLN
10.0000 mg | ORAL | Status: DC | PRN
  Administered 2017-05-06: 10 mg via ORAL
  Filled 2017-05-05: qty 0.5

## 2017-05-05 MED ORDER — ACETAMINOPHEN 325 MG PO TABS: 650.0000 mg | ORAL_TABLET | Freq: Four times a day (QID) | ORAL | Status: DC | PRN

## 2017-05-05 MED ORDER — ZOLPIDEM TARTRATE 5 MG PO TABS
5.0000 mg | ORAL_TABLET | Freq: Every evening | ORAL | Status: DC | PRN
  Administered 2017-05-05: 5 mg via ORAL
  Filled 2017-05-05: qty 1

## 2017-05-05 MED ORDER — PANTOPRAZOLE SODIUM 40 MG PO TBEC: 40.0000 mg | DELAYED_RELEASE_TABLET | Freq: Every day | ORAL | Status: DC

## 2017-05-05 MED ORDER — VERAPAMIL HCL ER 180 MG PO TBCR
180.0000 mg | EXTENDED_RELEASE_TABLET | Freq: Every day | ORAL | Status: DC
  Administered 2017-05-05: 180 mg via ORAL
  Filled 2017-05-05 (×2): qty 1

## 2017-05-05 MED ORDER — ALBUTEROL (5 MG/ML) CONTINUOUS INHALATION SOLN
INHALATION_SOLUTION | RESPIRATORY_TRACT | Status: AC
  Administered 2017-05-05: 10 mg/h via RESPIRATORY_TRACT
  Filled 2017-05-05: qty 20

## 2017-05-05 MED ORDER — FUROSEMIDE 20 MG PO TABS: 20.0000 mg | ORAL_TABLET | Freq: Every day | ORAL | Status: DC

## 2017-05-05 MED ORDER — ALBUTEROL (5 MG/ML) CONTINUOUS INHALATION SOLN
10.0000 mg/h | INHALATION_SOLUTION | RESPIRATORY_TRACT | Status: DC
  Administered 2017-05-05: 10 mg/h via RESPIRATORY_TRACT
  Filled 2017-05-05: qty 20

## 2017-05-05 NOTE — Progress Notes (Signed)
[facility] Liaison: RN visit  Notified by Larina Bras, PMT  of patient/family request for [facility] services at home after discharge. Chart and patient information under review by [facility] physician.  Hospice eligibility pending at this time. Writer spoke with daughter, Eustaquio Maize at bedside to initiate education related to hospice philosophy, services and team approach to care. Beth  verbalized understanding of information given. Per discussion, plan is for discharge to home by private vehicle tomorrow.   Please send signed and completed DNR form home with patient/family. Patient will need prescriptions for discharge comfort medications. DME needs have been discussed, patient currently has the following equipment in the home: hospital bed, Shower chair, W/C and walker . Patient/family requests the following DME for delivery to the home: OBT and oxygen . [facility] equipment manager has been notified and will contact [place] to arrange delivery to the home. Home address has been verified and is correct in the chart.  Eustaquio Maize [place] is the family member to contact to arrange time of delivery.  [facility] aware of the above. Please notify [facility] when patient is ready to leave the unit at discharge. (Call [phone] or [phone] after 5pm.) [facility] information and contact numbers given to at time of visit.   Please call with any hospice related questions.  Thank you for this referral.  Farrel Gordon, RN, [facility] Liaison [phone] ? Hospital liaisons are now on [place].

## 2017-05-05 NOTE — ED Notes (Signed)
0.11 critical troponin value from Lab. MD notified.

## 2017-05-05 NOTE — H&P (Addendum)
History and Physical:    OLUWANIFEMI SUSMAN, MD   JJO:[id number] DOB: 09-29-1924 DOA: 05/05/2017  Referring MD/provider: Dr. 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 He has resisted coming to the emergency room because he did not want to be admitted. This morning however he became so short of breath that ambulance was called and he was treated with IV steroids and inhaled bronchodilators.  Per my conversation with patient and his daughter, patient does not want to be admitted to the hospital does not want further treatment. He would like to be set up for hospice however they're unable to take care of him at home due to extreme shortness of breath. Patient admits to treatment while he is here however would like to leave the hospital as soon as possible even if he is still short of breath  ED Course:  The patient was treated with CPAP, IV steroids and inhaled bronchodilators.  ROS:   ROS   NA  Past Medical History:   Past Medical History:  Diagnosis Date  . Adenocarcinoma of right lung, stage 1 ([place]) 12/11/2014   LLL/pt on 04/26/2016  . Asthma   . Atrial fibrillation ([place])   . Basal cell carcinoma    "mostly face" (04/26/2016)  . Bowel obstruction ([place])   . Bradycardia   . COPD (chronic obstructive pulmonary disease) (HCC)    mild  . Diverticulosis of colon   . DVT (deep venous thrombosis) (HCC)    LLE  . Hemorrhoids   . Hx of adenomatous colonic polyps 02/1997  . Hyperlipidemia   . Hypertension   . 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 NSTEMI (non-ST elevated myocardial infarction) ([place]) 04/25/2016  . Osteoarthritis    "back, extremities, left knee, left hip" (04/26/2016)  . Palpitations   . Pleural effusion 11/2014   "after needle ablation for lung cancer"  . Pneumonia 1-2 times  . Squamous carcinoma    "mostly face" (04/26/2016)  . Upper respiratory symptom 03/25/2016    Past Surgical History:   Past Surgical History:  Procedure Laterality Date  . APPENDECTOMY    . BACK SURGERY    . CARDIAC CATHETERIZATION  04/26/2016  . CARDIAC CATHETERIZATION N/A 04/26/2016   Procedure: Coronary/Graft Angiography;  Surgeon: Lorretta Harp, MD;  Location: [facility];  Service: Cardiovascular;  Laterality: N/A;  . CATARACT EXTRACTION W/ INTRAOCULAR LENS IMPLANT Right   . COLECTOMY     "large colon for diverticulitis"  . CYSTOSCOPY     "I was having prostate/bladder trouble"  . INGUINAL HERNIA REPAIR Bilateral   . JOINT REPLACEMENT    . LUMBAR LAMINECTOMY Right     L4-L5   . SHOULDER OPEN ROTATOR CUFF REPAIR Right   . TONSILLECTOMY    . TOTAL HIP ARTHROPLASTY Right   . UMBILICAL HERNIA REPAIR    . VIDEO BRONCHOSCOPY WITH ENDOBRONCHIAL NAVIGATION N/A 12/18/2014   Procedure: VIDEO BRONCHOSCOPY WITH ENDOBRONCHIAL NAVIGATION ;  Surgeon: Collene Gobble, MD;  Location: [facility];  Service: Thoracic;  Laterality: N/A;    Social History:   Social History   Socioeconomic History  . Marital status: Widowed    Spouse name: Not on file  . Number of children: 5  . Years of education: Not on file  . Highest education level: Not on file  Social Needs  . Financial resource strain: Not on file  . Food insecurity - worry: Not on file  . Food insecurity - inability: Not on file  . Transportation needs - medical: Not on file  . Transportation needs - non-medical: Not on file  Occupational History  . Occupation: retired Engineer, drilling  Tobacco Use  . Smoking status: Former Smoker    Packs/day: 1.50      Years: 13.00    Pack years: 19.50    Types: Cigarettes    Last attempt to quit: 03/29/1956    Years since quitting: 61.1  . Smokeless tobacco: Never Used  Substance and Sexual Activity  . Alcohol use: No    Comment: 04/26/2016 "nothing in the last 3-4 months; did drink 2 glasses of wine/night"  . Drug use: No  . Sexual activity: No  Other Topics Concern  . 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 Stroke Mother 79  . Lung cancer Father 25       smoker  . Leukemia Brother   . Alzheimer's disease Sister   . Autoimmune disease Brother        Polyarteritis nodosa  . Colon cancer Neg Hx     Current Medications:   Prior to Admission medications   Medication Sig Start Date End Date Taking? Authorizing Provider  albuterol (PROVENTIL HFA;VENTOLIN HFA) 108 (90 Base) MCG/ACT inhaler Inhale 1-2 puffs into the lungs every 6 (six) hours as needed for wheezing or shortness of breath.    [provider]  alendronate (FOSAMAX) 70 MG tablet Take 1 tablet by mouth once a week. 10/05/16   [provider]  CALCIUM PO Take 1,000 mg by mouth daily.    [provider]  cetirizine (ZYRTEC) 10 MG tablet Take 10 mg by mouth every evening.     [provider]  clotrimazole-betamethasone (LOTRISONE) cream  12/09/16   [provider]  dicyclomine (BENTYL) 10 MG capsule Take 1 capsule (10 mg total) by mouth 4 (four) times daily -  before meals and at bedtime. 03/08/16   Ladene Artist, MD  finasteride (PROSCAR) 5 MG tablet Take 5 mg by mouth daily.     [provider]  fluticasone (FLONASE) 50 MCG/ACT nasal spray Place 2 sprays into both nostrils every morning.    [provider]  Fluticasone-Salmeterol (ADVAIR) 250-50 MCG/DOSE AEPB Inhale 1 puff into the lungs 2  (two) times daily.    [provider]  furosemide (LASIX) 20 MG tablet Take 1 tablet by mouth as needed. 09/20/16   [provider]  HYDROcodone-acetaminophen (NORCO/VICODIN) 5-325 MG tablet Take 1-2 tablets by mouth every 4 (four) hours as needed for moderate pain. Patient not taking: Reported on 01/03/2017 03/10/16   Geradine Girt, DO  hyoscyamine (LEVSIN SL) 0.125 MG SL tablet Place 1 tablet (0.125 mg total) under the tongue every 4 (four) hours as needed. 10/20/16   Ladene Artist, MD  montelukast Laurine Blazer)  10 MG tablet Take 10 mg by mouth at bedtime.    [provider]  omeprazole (PRILOSEC) 20 MG capsule Take 1 capsule (20 mg total) by mouth daily. 06/14/16   Ladene Artist, MD  predniSONE (DELTASONE) 10 MG tablet Take 10 mg by mouth daily. 09/23/15   [provider]  temazepam (RESTORIL) 15 MG capsule Take 15-30 mg by mouth at bedtime.     [provider]  Tiotropium Bromide Monohydrate (SPIRIVA RESPIMAT) 2.5 MCG/ACT AERS Inhale 2 puffs into the lungs daily.    [provider]  verapamil (VERELAN PM) 180 MG 24 hr capsule Take 180 mg by mouth at bedtime.    [provider]  zolpidem (AMBIEN) 10 MG tablet Take 10 mg by mouth at bedtime as needed for sleep.    [provider]  beclomethasone (QVAR) 80 MCG/ACT inhaler Inhale 2 puffs into the lungs 2 (two) times daily.  06/23/11  [provider]    Physical Exam:   Vitals:   05/05/17 1345 05/05/17 1415 05/05/17 1427 05/05/17 1430  BP: 137/84 (!) 145/74  120/90  Pulse: 78 80  86  Resp: 18 18  (!) 27  Temp:      TempSrc:      SpO2: 97% 98% 98% 100%  Weight:      Height:         Physical Exam: Blood pressure 120/90, pulse 86, temperature 97.9 F (36.6 C), temperature source Rectal, resp. rate (!) 27, height 5\' 10"  (1.778 m), weight 73.9 kg (163 lb), SpO2 100 %. Gen: Frail elderly man lying in bed at 30 with elevated respiratory rate and a gurgling  sound in his chest Eyes: Sclerae anicteric. Conjunctiva mildly injected. Chest: Moderately good air entry bilaterally with transmitted upper respiratory sounds and scattered wheezes.  CV: Distant, regular, no audible murmurs. Abdomen: NABS, soft, nondistended, nontender. No tenderness to light or deep palpation. Extremities: No edema.  Skin: Warm and dry. No rashes, lesions or wounds. Neuro: Sleepy but arousable by voice alone, he is coherent and clearly requests to go home with hospice. Psych: Patient is cooperative and coherent  Data Review:    Labs: Basic Metabolic Panel: Recent Labs  Lab 05/05/17 1101  NA 135  K 3.5  CL 92*  CO2 30  GLUCOSE 107*  BUN 30*  CREATININE 1.39*  CALCIUM 9.6   Liver Function Tests: Recent Labs  Lab 05/05/17 1101  AST 25  ALT 14*  ALKPHOS 54  BILITOT 0.7  PROT 6.4*  ALBUMIN 3.3*   No results for input(s): LIPASE, AMYLASE in the last 168 hours. No results for input(s): AMMONIA in the last 168 hours. CBC: Recent Labs  Lab 05/05/17 1101  WBC 6.9  NEUTROABS 4.9  HGB 11.4*  HCT 36.0*  MCV 100.0  PLT 144*   Cardiac Enzymes: Recent Labs  Lab 05/05/17 1101  TROPONINI 0.11*    BNP (last 3 results) No results for input(s): PROBNP in the last 8760 hours. CBG: No results for input(s): GLUCAP in the last 168 hours.  Urinalysis    Component Value Date/Time   COLORURINE YELLOW 03/10/2016 [place] 03/10/2016 0642   LABSPEC 1.018 03/10/2016 0642   PHURINE 5.0 03/10/2016 [place] 03/10/2016 0642   HGBUR NEGATIVE 03/10/2016 [place] NEGATIVE 03/10/2016 [place] 03/10/2016 0642   PROTEINUR NEGATIVE 03/10/2016 0642   UROBILINOGEN 0.2 08/25/2009 1739   NITRITE NEGATIVE 03/10/2016 6378  LEUKOCYTESUR NEGATIVE 03/10/2016 1657      Radiographic Studies: Dg Chest Port 1 View  Result Date: 05/05/2017 CLINICAL DATA:  Shortness of breath, weakness EXAM: PORTABLE CHEST 1  VIEW COMPARISON:  04/25/2016 FINDINGS: Nodular density again noted in the left suprahilar region, unchanged. Nodular areas also in the right upper lobe, unchanged. Increasing bibasilar atelectasis or infiltrates. Cardiomegaly with vascular congestion. IMPRESSION: Bilateral upper lobe nodular densities are unchanged. Cardiomegaly, vascular congestion. Increasing bibasilar atelectasis or infiltrates. Electronically Signed   By: Rolm Baptise M.D.   On: 05/05/2017 11:08    EKG: Independently reviewed. Atrial fibrillation, left axis deviation, Q's in V1 through V3.   Assessment/Plan:   Principal Problem:   Acute respiratory failure with hypercapnia (HCC) Active Problems:   Adenocarcinoma of right lung, stage 1 (HCC)   History of Torsades de pointes 2/2 anesthesia for shoulder relocations in 2017   SOB Patient with COPD and chest x-ray with increased vascular congestion. Will treat with Lasix and continued steroids with inhaled bronchodilators  ELEVATED TROPONIN  Patient with mild elevation of troponin, he denies any chest pain. He does not want further therapy.  PALLIATIVE CARE Case management, physical therapy and palate of care have been consulted Patient would like to go home as soon as possible once home health has been set up.  ATRIAL FIBRILLATION Continue verapamil Patient is not on anti-coagulation at home, no need to start now  BPH Continue finasteride  GERD Continue PPI  Patient is DO NOT RESUSCITATE           Other information:   DVT prophylaxis: Lovenox ordered. Code Status:DO NOT RESUSCITATE. Family Communication: Daughter was at bedside Throughout  Disposition Plan: Home, home hospice  Consults called: Palliative care  Admission status: Inpatient   The medical decision making is of moderate complexity, therefore this is a level 2 visit.  Dewaine Oats Derek Jack [facility] Pager [phone] Cell: [phone]   If 7PM-7AM, please  contact night-coverage [email or web address] Password [facility] 05/05/2017, 3:43 PM

## 2017-05-05 NOTE — Progress Notes (Addendum)
Palliative Medicine RN Note: Patient seen in ED. He is sleeping with [place] in place. He is dyspneic despite being at rest. Family states that he very clearly wants to go home with hospice. He has 24 hour paid caregivers already in place. They have used [facility] in the past and would like to use their services again. Spoke with Dr Hilma Favors and initiated comfort meds. I called [facility], as he is still in the ED, and I cannot find the [facility].  I will follow up at 1000 tomorrow.   Marjie Skiff Takeem Krotzer, RN, BSN, [facility] Palliative Medicine Team 05/06/2017 9:21 AM Office [phone]

## 2017-05-05 NOTE — ED Provider Notes (Signed)
[facility] EMERGENCY DEPARTMENT Provider Note   CSN: [id number] Arrival date & time: 05/05/17  1034     History   Chief Complaint Chief Complaint  Patient presents with  . 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  84 year old male presents with shortness of breath that has been worsening times several days.  Was recently started on antibiotics for possible pneumonia.  Today things became worse with exertion as well as lying flat.  Does have have a history of stress-induced cardia myopathy.  He denies any recent vomiting or diarrhea but has had subjective fever and chills.  Called EMS and was found to be short of breath and was given albuterol treatment along with IV Solu-Medrol.  During transport he became more dyspneic and was placed on CPAP.      Past Medical History:  Diagnosis Date  . Adenocarcinoma of right lung, stage 1 ([place]) 12/11/2014   LLL/pt on 04/26/2016  . Asthma   . Atrial fibrillation ([place])   . Basal cell carcinoma    "mostly face" (04/26/2016)  . Bowel obstruction ([place])   . Bradycardia   . COPD (chronic obstructive pulmonary disease) (HCC)    mild  . Diverticulosis of colon   . DVT (deep venous thrombosis) (HCC)    LLE  . Hemorrhoids   . Hx of adenomatous colonic polyps 02/1997  . Hyperlipidemia   . Hypertension   . 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 NSTEMI (non-ST elevated myocardial infarction) ([place]) 04/25/2016  . Osteoarthritis    "back, extremities, left knee, left hip" (04/26/2016)  . Palpitations   . Pleural effusion 11/2014   "after needle ablation for lung cancer"  . Pneumonia 1-2 times  . Squamous carcinoma    "mostly face" (04/26/2016)  . Upper respiratory symptom 03/25/2016    Patient Active Problem List   Diagnosis Date Noted  . Stress-induced cardiomyopathy   . NSTEMI (non-ST elevated myocardial infarction) ([place]) 04/25/2016  . Upper respiratory symptom 03/25/2016  . Left  arm swelling 03/25/2016  . Troponin I above reference range 03/10/2016  . Shoulder dislocation, left, initial encounter 03/10/2016  . 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 Right elbow pain 12/02/2015  . Cellulitis 10/15/2015  . Pleural effusion 01/23/2015  . Acute respiratory failure with hypoxia ([place]) 01/07/2015  . Permanent atrial fibrillation ([place]) 01/07/2015  . Acute respiratory failure ([place]) 01/07/2015  . Shortness of breath 01/06/2015  . Patellar tendinitis of right knee 12/12/2014  . Adenocarcinoma of right lung, stage 1 ([place]) 12/11/2014  . Traumatic rotator cuff tear 11/07/2014  . Compression fracture of L1 lumbar vertebra (HCC) 11/07/2014  . Lumbar degenerative disc disease 09/11/2014  . Osteoarthritis of right knee 09/11/2014  . Chronic anticoagulation 11/22/2012  . Dyslipidemia 08/06/2008  . COLONIC POLYPS, ADENOMATOUS 07/17/2008  . Essential hypertension 07/17/2008  . BRADYCARDIA 07/17/2008  . HEMORRHOIDS 07/17/2008  . Asthma 07/17/2008  . DIVERTICULOSIS, COLON 07/17/2008  . PALPITATIONS 07/17/2008    Past Surgical History:  Procedure Laterality Date  . APPENDECTOMY    . BACK SURGERY    . CARDIAC CATHETERIZATION  04/26/2016  . CARDIAC CATHETERIZATION N/A 04/26/2016   Procedure: Coronary/Graft Angiography;  Surgeon: Lorretta Harp, MD;  Location: [facility];  Service: Cardiovascular;  Laterality: N/A;  . CATARACT EXTRACTION W/ INTRAOCULAR LENS IMPLANT Right   . COLECTOMY     "large  colon for diverticulitis"  . CYSTOSCOPY     "I was having prostate/bladder trouble"  . INGUINAL HERNIA REPAIR Bilateral   . JOINT REPLACEMENT    . LUMBAR LAMINECTOMY Right     L4-L5   . SHOULDER OPEN ROTATOR CUFF REPAIR Right   . TONSILLECTOMY    . TOTAL HIP ARTHROPLASTY Right   . UMBILICAL HERNIA REPAIR    . VIDEO BRONCHOSCOPY WITH ENDOBRONCHIAL NAVIGATION N/A 12/18/2014   Procedure: VIDEO BRONCHOSCOPY WITH  ENDOBRONCHIAL NAVIGATION ;  Surgeon: Collene Gobble, MD;  Location: [facility];  Service: Thoracic;  Laterality: N/A;       Home Medications    Prior to Admission medications   Medication Sig Start Date End Date Taking? Authorizing Provider  albuterol (PROVENTIL HFA;VENTOLIN HFA) 108 (90 Base) MCG/ACT inhaler Inhale 1-2 puffs into the lungs every 6 (six) hours as needed for wheezing or shortness of breath.    [provider]  alendronate (FOSAMAX) 70 MG tablet Take 1 tablet by mouth once a week. 10/05/16   [provider]  CALCIUM PO Take 1,000 mg by mouth daily.    [provider]  cetirizine (ZYRTEC) 10 MG tablet Take 10 mg by mouth every evening.     [provider]  clotrimazole-betamethasone (LOTRISONE) cream  12/09/16   [provider]  dicyclomine (BENTYL) 10 MG capsule Take 1 capsule (10 mg total) by mouth 4 (four) times daily -  before meals and at bedtime. 03/08/16   Ladene Artist, MD  finasteride (PROSCAR) 5 MG tablet Take 5 mg by mouth daily.     [provider]  fluticasone (FLONASE) 50 MCG/ACT nasal spray Place 2 sprays into both nostrils every morning.    [provider]  Fluticasone-Salmeterol (ADVAIR) 250-50 MCG/DOSE AEPB Inhale 1 puff into the lungs 2 (two) times daily.    [provider]  furosemide (LASIX) 20 MG tablet Take 1 tablet by mouth as needed. 09/20/16   [provider]  HYDROcodone-acetaminophen (NORCO/VICODIN) 5-325 MG tablet Take 1-2 tablets by mouth every 4 (four) hours as needed for moderate pain. Patient not taking: Reported on 01/03/2017 03/10/16   Geradine Girt, DO  hyoscyamine (LEVSIN SL) 0.125 MG SL tablet Place 1 tablet (0.125 mg total) under the tongue every 4 (four) hours as needed. 10/20/16   Ladene Artist, MD  montelukast (SINGULAIR) 10 MG tablet Take 10 mg by mouth at bedtime.    [provider]  omeprazole (PRILOSEC) 20 MG capsule Take 1 capsule (20 mg total)  by mouth daily. 06/14/16   Ladene Artist, MD  predniSONE (DELTASONE) 10 MG tablet Take 10 mg by mouth daily. 09/23/15   [provider]  temazepam (RESTORIL) 15 MG capsule Take 15-30 mg by mouth at bedtime.     [provider]  Tiotropium Bromide Monohydrate (SPIRIVA RESPIMAT) 2.5 MCG/ACT AERS Inhale 2 puffs into the lungs daily.    [provider]  verapamil (VERELAN PM) 180 MG 24 hr capsule Take 180 mg by mouth at bedtime.    [provider]  zolpidem (AMBIEN) 10 MG tablet Take 10 mg by mouth at bedtime as needed for sleep.    [provider]  beclomethasone (QVAR) 80 MCG/ACT inhaler Inhale 2 puffs into the lungs 2 (two) times daily.  06/23/11  [provider]    Family History Family History  Problem Relation Age of Onset  . Stroke Mother 56  . Lung cancer Father 22  smoker  . Leukemia Brother   . Alzheimer's disease Sister   . Autoimmune disease Brother        Polyarteritis nodosa  . Colon cancer Neg Hx     Social History Social History   Tobacco Use  . Smoking status: Former Smoker    Packs/day: 1.50    Years: 13.00    Pack years: 19.50    Types: Cigarettes    Last attempt to quit: 03/29/1956    Years since quitting: 61.1  . Smokeless tobacco: Never Used  Substance Use Topics  . Alcohol use: No    Comment: 04/26/2016 "nothing in the last 3-4 months; did drink 2 glasses of wine/night"  . Drug use: No     Allergies   Sulfa antibiotics   Review of Systems Review of Systems  Unable to perform ROS: Severe respiratory distress     Physical Exam Updated Vital Signs There were no vitals taken for this visit.  Physical Exam  Constitutional: He is oriented to person, place, and time. He appears well-developed and well-nourished.  Non-toxic appearance. No distress.  HENT:  Head: Normocephalic and atraumatic.  Eyes: Conjunctivae, EOM and lids are normal. Pupils are equal, round, and reactive to light.  Neck:  Normal range of motion. Neck supple. No tracheal deviation present. No thyroid mass present.  Cardiovascular: Regular rhythm and normal heart sounds. Tachycardia present. Exam reveals no gallop.  No murmur heard. Pulmonary/Chest: No stridor. Tachypnea noted. He is in respiratory distress. He has decreased breath sounds in the right lower field and the left lower field. He has no wheezes. He has no rhonchi. He has rales in the right lower field and the left lower field.  Abdominal: Soft. Normal appearance and bowel sounds are normal. He exhibits no distension. There is no tenderness. There is no rebound and no CVA tenderness.  Musculoskeletal: Normal range of motion. He exhibits no edema or tenderness.  Lymphadenopathy:  3+ bilateral lower extremity pitting edema  Neurological: He is alert and oriented to person, place, and time. He has normal strength. No cranial nerve deficit or sensory deficit. GCS eye subscore is 4. GCS verbal subscore is 5. GCS motor subscore is 6.  Skin: Skin is warm. No abrasion and no rash noted. He is diaphoretic.  Psychiatric: His mood appears anxious.  Nursing note and vitals reviewed.    ED Treatments / Results  Labs (all labs ordered are listed, but only abnormal results are displayed) Labs Reviewed  CULTURE, BLOOD (ROUTINE X 2)  CULTURE, BLOOD (ROUTINE X 2)  COMPREHENSIVE METABOLIC PANEL  CBC WITH DIFFERENTIAL/PLATELET  URINALYSIS, ROUTINE W REFLEX MICROSCOPIC  BLOOD GAS, ARTERIAL  TROPONIN I  BRAIN NATRIURETIC PEPTIDE  I-STAT CG4 LACTIC ACID, ED    EKG  EKG Interpretation None       Radiology No results found.  Procedures Procedures (including critical care time)  Medications Ordered in ED Medications - No data to display   Initial Impression / Assessment and Plan / ED Course  I have reviewed the triage vital signs and the nursing notes.  Pertinent labs & imaging results that were available during my care of the patient were reviewed by  me and considered in my medical decision making (see chart for details).    Spoke to family and patient is a DNR.  Has evidence of CHF based on his chest x-ray as well as his blood work.  Patient's ABG shows some evidence of hypercapnia.  Patient initially was on CPAP but  was transitioned to nonrebreather.  Discussed patient's case at length with his family and they are requesting a palliative care consult.  Will admit to the hospitalist service   CRITICAL CARE Performed by: Leota Jacobsen Total critical care time: 50 minutes Critical care time was exclusive of separately billable procedures and treating other patients. Critical care was necessary to treat or prevent imminent or life-threatening deterioration. Critical care was time spent personally by me on the following activities: development of treatment plan with patient and/or surrogate as well as nursing, discussions with consultants, evaluation of patient's response to treatment, examination of patient, obtaining history from patient or surrogate, ordering and performing treatments and interventions, ordering and review of laboratory studies, ordering and review of radiographic studies, pulse oximetry and re-evaluation of patient's condition.   Final Clinical Impressions(s) / ED Diagnoses   Final diagnoses:  None    ED Discharge Orders    None       Lacretia Leigh, MD 05/05/17 1316

## 2017-05-05 NOTE — ED Triage Notes (Signed)
Patient seen by PCP on Monday and given antibiotics and home neb treatment. Per EMS family states patient has been "going downhill" since then and transported to [facility] for treatment. Pt arrived on CPAP, given 2 duonebs and 125 solumedrol by EMS

## 2017-05-05 NOTE — ED Notes (Signed)
Requested to adjust times on Solu-medrol last given at 1817

## 2017-05-06 ENCOUNTER — Encounter (HOSPITAL_COMMUNITY): Payer: Self-pay

## 2017-05-06 DIAGNOSIS — R0600 Dyspnea, unspecified: Secondary | ICD-10-CM

## 2017-05-06 DIAGNOSIS — J45901 Unspecified asthma with (acute) exacerbation: Secondary | ICD-10-CM

## 2017-05-06 DIAGNOSIS — C3491 Malignant neoplasm of unspecified part of right bronchus or lung: Secondary | ICD-10-CM

## 2017-05-06 DIAGNOSIS — Z8679 Personal history of other diseases of the circulatory system: Secondary | ICD-10-CM

## 2017-05-06 DIAGNOSIS — J9602 Acute respiratory failure with hypercapnia: Principal | ICD-10-CM

## 2017-05-06 DIAGNOSIS — I509 Heart failure, unspecified: Secondary | ICD-10-CM

## 2017-05-06 LAB — BASIC METABOLIC PANEL
Anion gap: 14 (ref 5–15)
BUN: 34 mg/dL — ABNORMAL HIGH (ref 6–20)
CHLORIDE: 94 mmol/L — AB (ref 101–111)
CO2: 28 mmol/L (ref 22–32)
Calcium: 9.1 mg/dL (ref 8.9–10.3)
Creatinine, Ser: 1.59 mg/dL — ABNORMAL HIGH (ref 0.61–1.24)
GFR calc non Af Amer: 37 mL/min — ABNORMAL LOW (ref >60)
GFR, EST AFRICAN AMERICAN: 42 mL/min — AB (ref >60)
Glucose, Bld: 137 mg/dL — ABNORMAL HIGH (ref 65–99)
Potassium: 3.9 mmol/L (ref 3.5–5.1)
Sodium: 136 mmol/L (ref 135–145)

## 2017-05-06 LAB — TROPONIN I
TROPONIN I: 0.14 ng/mL — AB (ref <0.03)
Troponin I: 0.15 ng/mL (ref <0.03)

## 2017-05-06 LAB — CBC
HEMATOCRIT: 34.1 % — AB (ref 39.0–52.0)
Hemoglobin: 10.7 g/dL — ABNORMAL LOW (ref 13.0–17.0)
MCH: 30.8 pg (ref 26.0–34.0)
MCHC: 31.4 g/dL (ref 30.0–36.0)
MCV: 98.3 fL (ref 78.0–100.0)
Platelets: 131 10*3/uL — ABNORMAL LOW (ref 150–400)
RBC: 3.47 MIL/uL — AB (ref 4.22–5.81)
RDW: 16.7 % — ABNORMAL HIGH (ref 11.5–15.5)
WBC: 6.2 10*3/uL (ref 4.0–10.5)

## 2017-05-06 LAB — INFLUENZA PANEL BY PCR (TYPE A & B)
Influenza A By PCR: NEGATIVE
Influenza B By PCR: NEGATIVE

## 2017-05-06 MED ORDER — PREDNISONE 20 MG PO TABS: 40.0000 mg | ORAL_TABLET | Freq: Every day | ORAL | 0 refills | Status: AC

## 2017-05-06 MED ORDER — LORAZEPAM 1 MG PO TABS: 1.0000 mg | ORAL_TABLET | ORAL | 0 refills | Status: AC | PRN

## 2017-05-06 MED ORDER — MORPHINE SULFATE (CONCENTRATE) 10 MG/0.5ML PO SOLN: 10.0000 mg | ORAL | 0 refills | Status: AC | PRN

## 2017-05-06 MED ORDER — ZOLPIDEM TARTRATE 5 MG PO TABS: 5.0000 mg | ORAL_TABLET | Freq: Every evening | ORAL | 0 refills | Status: AC | PRN

## 2017-05-06 NOTE — Discharge Summary (Signed)
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 He has resisted coming to the emergency room because he did not want to be admitted. This morning however he became so short of breath that ambulance was called and he was treated with IV steroids and inhaled bronchodilators.  Per my conversation with patient and his daughter, patient does not want to be admitted to the hospital does not want further treatment. He would like to be set up for hospice however they're unable to take care of him at home due to extreme shortness of breath. Patient admits to treatment while he is here however would like to leave the hospital as soon as possible even if he is still short of breath     Hospital course:  Patient's dyspnea secondary to likely mild heart failure exacerbation (no documented reduced EF or diastolic dysfunction) and asthma exacerbation.  He was managed with an increase in prednisone in addition to bronchodilators and Lasix IV.  He had a mild elevation in troponin without chest pain.  On admission, patient made it clear that he did not want to pursue any aggressive management and requested hospice care.  Patient's  dyspnea was managed well with prednisone, Lasix, and bronchodilators.  Lasix reduced to home dose with instructions to double the dose as needed for weight gain or dyspnea.  Patient was discharged on prednisone burst and will restart his home dose of 10 mg daily following prednisone burst.  While in the hospital, palate of care was consulted and helped with transition to hospice care at home.  Flutter valve provided on discharge.  Patient discharged with oxycodone as needed for dyspnea and pain.  Patient set up for home hospice.  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 in 2017   CHF (congestive heart failure) ([place])    Discharge Instructions   Allergies as of 05/06/2017      Reactions   Sulfa Antibiotics Other (See Comments)   Unsure of reaction      Medication List    STOP taking these medications   alendronate 70 MG tablet Commonly known as:  FOSAMAX   HYDROcodone-acetaminophen 5-325 MG tablet Commonly known as:  NORCO/VICODIN   HYDROMET 5-1.5 MG/5ML syrup Generic drug:  HYDROcodone-homatropine   omeprazole 20 MG capsule Commonly known as:  PRILOSEC     TAKE these medications   albuterol 108 (90 Base) MCG/ACT inhaler Commonly known as:  PROVENTIL HFA;VENTOLIN HFA Inhale 1-2 puffs into the lungs every 6 (six) hours as needed for wheezing or shortness of breath.   benzonatate 100 MG capsule Commonly known as:  TESSALON Take 100 mg by mouth 3 (  three) times daily as needed for cough.   bismuth subsalicylate 932 IZ/12WP suspension Commonly known as:  PEPTO BISMOL Take 30 mLs by mouth 3 (three) times daily.   CALCIUM PO Take 1,000 mg by mouth daily.   cefdinir 300 MG capsule Commonly known as:  OMNICEF Take 300 mg by mouth 2 (two) times daily.   cetirizine 10 MG tablet Commonly  known as:  ZYRTEC Take 10 mg by mouth every evening.   cholecalciferol 1000 units tablet Commonly known as:  VITAMIN D Take 1,000 Units by mouth daily.   dicyclomine 10 MG capsule Commonly known as:  BENTYL Take 1 capsule (10 mg total) by mouth 4 (four) times daily -  before meals and at bedtime.   finasteride 5 MG tablet Commonly known as:  PROSCAR Take 5 mg by mouth daily.   fluticasone 50 MCG/ACT nasal spray Commonly known as:  FLONASE Place 2 sprays into both nostrils every morning.   Fluticasone-Salmeterol 250-50 MCG/DOSE Aepb Commonly known as:  ADVAIR Inhale 1 puff into the lungs 2 (two) times daily.   furosemide 20 MG tablet Commonly known as:  LASIX Take 20 mg by mouth as needed for fluid.   hyoscyamine 0.125 MG SL tablet Commonly known as:  LEVSIN SL Place 1 tablet (0.125 mg total) under the tongue every 4 (four) hours as needed. What changed:  when to take this   LORazepam 1 MG tablet Commonly known as:  ATIVAN Take 1 tablet (1 mg total) by mouth every 4 (four) hours as needed for anxiety.   montelukast 10 MG tablet Commonly known as:  SINGULAIR Take 10 mg by mouth at bedtime.   morphine CONCENTRATE 10 MG/0.5ML Soln concentrated solution Take 0.5-1 mLs (10-20 mg total) by mouth every 3 (three) hours as needed for moderate pain or shortness of breath.   predniSONE 20 MG tablet Commonly known as:  DELTASONE Take 2 tablets (40 mg total) by mouth daily with breakfast. Take for 4 days, then resume chronic dose of prednisone 10 mg daily Start taking on:  05/07/2017 What changed:    medication strength  how much to take  when to take this  additional instructions   SPIRIVA RESPIMAT 2.5 MCG/ACT Aers Generic drug:  Tiotropium Bromide Monohydrate Inhale 1 puff into the lungs daily.   temazepam 15 MG capsule Commonly known as:  RESTORIL Take 15 mg by mouth at bedtime.   verapamil 180 MG 24 hr capsule Commonly known as:  VERELAN PM Take 180 mg by mouth  at bedtime.   zolpidem 5 MG tablet Commonly known as:  AMBIEN Take 1 tablet (5 mg total) by mouth at bedtime as needed for sleep. What changed:    medication strength  how much to take       Allergies  Allergen Reactions  . Sulfa Antibiotics Other (See Comments)    Unsure of reaction    Consultations:  Palliative care  Hospice   Procedures/Studies: Dg Chest Port 1 View  Result Date: 05/05/2017 CLINICAL DATA:  Shortness of breath, weakness EXAM: PORTABLE CHEST 1 VIEW COMPARISON:  04/25/2016 FINDINGS: Nodular density again noted in the left suprahilar region, unchanged. Nodular areas also in the right upper lobe, unchanged. Increasing bibasilar atelectasis or infiltrates. Cardiomegaly with vascular congestion. IMPRESSION: Bilateral upper lobe nodular densities are unchanged. Cardiomegaly, vascular congestion. Increasing bibasilar atelectasis or infiltrates. Electronically Signed   By: Rolm Baptise M.D.   On: 05/05/2017 11:08      Subjective: Patient reports improvement in dyspnea.  Discharge Exam: Vitals:   05/06/17 0510 05/06/17 0858  BP: (!) 102/59   Pulse: 74   Resp: 18   Temp: 97.8 F (36.6 C)   SpO2: 94% 94%   Vitals:   05/05/17 2110 05/05/17 2113 05/06/17 0510 05/06/17 0858  BP:   (!) 102/59   Pulse:  92 74   Resp:  15 18   Temp:   97.8 F (36.6 C)   TempSrc:   Oral   SpO2: 97%  94% 94%  Weight:   74.3 kg (163 lb 14.4 oz)   Height:        General: Pt is alert, awake, not in acute distress Respiratory: Bilateral wheezing with prolonged expiratory phase.   unlabored breathing.  Able to speak in complete sentences.    The results of significant diagnostics from this hospitalization (including imaging, microbiology, ancillary and laboratory) are listed below for reference.     Microbiology: No results found for this or any previous visit (from the past 240 hour(s)).   Labs: BNP (last 3 results) Recent Labs    05/05/17 1101  BNP 635.2*    Basic Metabolic Panel: Recent Labs  Lab 05/05/17 1101 05/06/17 0532  NA 135 136  K 3.5 3.9  CL 92* 94*  CO2 30 28  GLUCOSE 107* 137*  BUN 30* 34*  CREATININE 1.39* 1.59*  CALCIUM 9.6 9.1   Liver Function Tests: Recent Labs  Lab 05/05/17 1101  AST 25  ALT 14*  ALKPHOS 54  BILITOT 0.7  PROT 6.4*  ALBUMIN 3.3*   No results for input(s): LIPASE, AMYLASE in the last 168 hours. No results for input(s): AMMONIA in the last 168 hours. CBC: Recent Labs  Lab 05/05/17 1101 05/06/17 0532  WBC 6.9 6.2  NEUTROABS 4.9  --   HGB 11.4* 10.7*  HCT 36.0* 34.1*  MCV 100.0 98.3  PLT 144* 131*   Cardiac Enzymes: Recent Labs  Lab 05/05/17 1101 05/05/17 2100 05/06/17 0040 05/06/17 0532  TROPONINI 0.11* 0.11* 0.14* 0.15*   BNP: Invalid input(s): POCBNP CBG: No results for input(s): GLUCAP in the last 168 hours. D-Dimer No results for input(s): DDIMER in the last 72 hours. Hgb A1c No results for input(s): HGBA1C in the last 72 hours. Lipid Profile No results for input(s): CHOL, HDL, LDLCALC, TRIG, CHOLHDL, LDLDIRECT in the last 72 hours. Thyroid function studies No results for input(s): TSH, T4TOTAL, T3FREE, THYROIDAB in the last 72 hours.  Invalid input(s): FREET3 Anemia work up No results for input(s): VITAMINB12, FOLATE, FERRITIN, TIBC, IRON, RETICCTPCT in the last 72 hours. Urinalysis    Component Value Date/Time   COLORURINE YELLOW 03/10/2016 0642   APPEARANCEUR CLEAR 03/10/2016 0642   LABSPEC 1.018 03/10/2016 0642   PHURINE 5.0 03/10/2016 0642   GLUCOSEU NEGATIVE 03/10/2016 0642   HGBUR NEGATIVE 03/10/2016 0642   BILIRUBINUR NEGATIVE 03/10/2016 0642   [place] 03/10/2016 0642   PROTEINUR NEGATIVE 03/10/2016 0642   UROBILINOGEN 0.2 08/25/2009 1739   NITRITE NEGATIVE 03/10/2016 0642   LEUKOCYTESUR NEGATIVE 03/10/2016 2694    Time coordinating discharge: Over 30 minutes  SIGNED:   Cordelia Poche, MD [facility] 05/06/2017, 10:43  AM Pager [phone]  If 7PM-7AM, please contact night-coverage [email or web address] Password [email or web address]

## 2017-05-06 NOTE — Plan of Care (Signed)
  Education: Knowledge of General Education information will improve 05/06/2017 0001 - Progressing by Theador Hawthorne, RN   Clinical Measurements: Will remain free from infection 05/06/2017 0001 - Progressing by Theador Hawthorne, RN   Coping: Level of anxiety will decrease 05/06/2017 0001 - Progressing by Theador Hawthorne, RN

## 2017-05-06 NOTE — Progress Notes (Signed)
[facility]- Liaison Note  Followed-up with daughter, Eustaquio Maize, regarding discharge home today with hospice services.  Per discussion, family is currently awaiting [facility] to contact to arrange delivery of equipment.  Phone call call to Gayland Curry, [facility] equipment manager to contact [facility] to coordinate delivery.  Per discussion, plan remains to discharge home today by private vehicle after the equipment is in the home.  Please send signed and completed DNR form home with patient/family. Patient will need prescriptions for discharge comfort medications.  Please notify [facility] when patient is ready to leave the unit at discharge. (Call [phone] or [phone] after 5pm.) [facility] information and contact numbers given to [facility].  Encouraged to contact at anytime if any questions or concerns arise.  Thank you,  Freddi Starr RN, BSN [facility] Liaison [phone]  All [place] liaisons now available on [place].

## 2017-05-06 NOTE — Progress Notes (Signed)
Pt discharge education provided at bedside with pt and pt daughters. Pt has all belongings including home oxygen and printed prescriptions.  Pt IV discontinued, catheter intact and telemetry removed. Pt discharged via wheelchair with nurse staff on home oxygen

## 2017-05-06 NOTE — Progress Notes (Signed)
Case manager working on pt discharge

## 2017-05-06 NOTE — Discharge Instructions (Signed)
Hospice Introduction Hospice is a service that is designed to provide people who are terminally ill and their families with medical, spiritual, and psychological support. Its aim is to improve your quality of life by keeping you as alert and comfortable as possible. Who will be my providers when I begin hospice care? Hospice teams often include:  A nurse.  A doctor. The hospice doctor will be available for your care, but you can bring your regular doctor or nurse practitioner.  Social workers.  Religious leaders (such as a Clinical biochemist).  Trained volunteers.  What roles will providers play in my care? Hospice is performed by a team of health care professionals and volunteers who:  Help keep you comfortable: ? Hospice can be provided in your home or in a homelike setting. ? The hospice staff works with your family and friends to help meet your needs. ? You will enjoy the support of loved ones by receiving much of your basic care from family and friends.  Provide pain relief and manage your symptoms. The staff supply all necessary medicines and equipment.  Provide companionship when you are alone.  Allow you and your family to rest. They may do light housekeeping, prepare meals, and run errands.  Provide counseling. They will make sure your emotional, spiritual, and social needs and those of your family are being met.  Provide spiritual care: ? Spiritual care will be individualized to meet your needs and your family's needs. ? Spiritual care may involve:  Helping you look at what death means to you.  Helping you say goodbye to your family and friends.  Performing a specific religious ceremony or ritual.  When should hospice care begin? Most people who use hospice are believed to have fewer than 6 months to live.  Your family and health care providers can help you decide when hospice services should begin.  If your condition improves, you may discontinue the program.  What  should I consider before selecting a program? Most hospice programs are run by nonprofit, independent organizations. Some are affiliated with hospitals, nursing homes, or home health care agencies. Hospice programs can take place in the home or at a hospice center, hospital, or skilled nursing facility. When choosing a hospice program, ask the following questions:  What services are available to me?  What services will be offered to my loved ones?  How involved will my loved ones be?  How involved will my health care provider be?  Who makes up the hospice care team? How are they trained or screened?  How will my pain and symptoms be managed?  If my circumstances change, can the services be provided in a different setting, such as my home or in the hospital?  Is the program reviewed and licensed by the state or certified in some other way?  Where can I learn more about hospice? You can learn about existing hospice programs in your area from your health care providers. You can also read more about hospice online. The websites of the following organizations contain helpful information:  The [facility] and Palliative Care Organization [facility]).  The Hospice Association of America ([place]).  The [place].  The American Cancer Society (ACS).  Hospice Net.  This information is not intended to replace advice given to you by your health care provider. Make sure you discuss any questions you have with your health care provider. Document Released: 07/02/2003 Document Revised: 10/30/2015 Document Reviewed: 01/23/2013 Elsevier Interactive Patient Education  2017 [employer].

## 2017-05-06 NOTE — Progress Notes (Signed)
Palliative Medicine RN Note: Pt is awake and relaxed, on [place]. He is ready to go home. Dr Lonny Prude was at bedside when I arrived. He will write for d/c and for [place]. I left a message for Stacy w [facility] to ensure Dr [name] will have portable O2 for the car ride home.  Family has my number in case of new needs/concerns.  [name] Skiff Antwione Picotte, RN, BSN, [facility] Palliative Medicine Team 05/06/2017 10:28 AM Office [phone]

## 2017-05-06 NOTE — Consult Note (Signed)
   [facility] Inpatient Consult   05/06/2017  Nettie Elm, MD [birth year] [id number]  Patient screened for potential [place] [facility] services. Patient is in the [employer] [facility] [place] [facility] services under patient's Medicare  plan. Chart review reveals patient will discharge home with Hospice per inpatient RNCM notes.  No current [facility] needs appropriate.  For questions contact:   Natividad Brood, RN BSN [facility] Liaison  [phone] business mobile phone Toll free office [phone]

## 2017-05-06 NOTE — Progress Notes (Signed)
Referral made to Hospice and Palliative Care for home hospice as requested. They are familiar with the patient and is scheduled to see the patient tomorrow. Mindi Slicker [facility] [phone]

## 2017-05-10 LAB — CULTURE, BLOOD (ROUTINE X 2)
CULTURE: NO GROWTH
CULTURE: NO GROWTH
SPECIAL REQUESTS: ADEQUATE
Special Requests: ADEQUATE

## 2017-05-27 DEATH — deceased

## 2017-12-30 ENCOUNTER — Ambulatory Visit (HOSPITAL_COMMUNITY): Payer: Medicare Other

## 2017-12-30 ENCOUNTER — Inpatient Hospital Stay: Payer: Medicare Other

## 2018-01-04 ENCOUNTER — Ambulatory Visit: Payer: Medicare Other | Admitting: Internal Medicine
